# Patient Record
Sex: Male | Born: 1960 | Race: White | Hispanic: No | Marital: Single | Smoking: Never smoker
Health system: Southern US, Community
[De-identification: ages and names within clinical notes are randomized; demographics above are authoritative.]

## PROBLEM LIST (undated history)

## (undated) DIAGNOSIS — Z905 Acquired absence of kidney: Secondary | ICD-10-CM

## (undated) DIAGNOSIS — D759 Disease of blood and blood-forming organs, unspecified: Secondary | ICD-10-CM

## (undated) DIAGNOSIS — F1011 Alcohol abuse, in remission: Secondary | ICD-10-CM

## (undated) DIAGNOSIS — D696 Thrombocytopenia, unspecified: Principal | ICD-10-CM

## (undated) DIAGNOSIS — F1010 Alcohol abuse, uncomplicated: Secondary | ICD-10-CM

## (undated) DIAGNOSIS — I330 Acute and subacute infective endocarditis: Secondary | ICD-10-CM

## (undated) DIAGNOSIS — Z87442 Personal history of urinary calculi: Secondary | ICD-10-CM

## (undated) DIAGNOSIS — K219 Gastro-esophageal reflux disease without esophagitis: Secondary | ICD-10-CM

## (undated) DIAGNOSIS — I10 Essential (primary) hypertension: Secondary | ICD-10-CM

## (undated) HISTORY — DX: Thrombocytopenia, unspecified: D69.6

## (undated) HISTORY — PX: OTHER SURGICAL HISTORY: SHX169

---

## 2008-02-03 ENCOUNTER — Emergency Department (HOSPITAL_COMMUNITY): Admission: EM | Admit: 2008-02-03 | Discharge: 2008-02-03 | Payer: Self-pay | Admitting: Emergency Medicine

## 2008-02-27 ENCOUNTER — Emergency Department (HOSPITAL_COMMUNITY): Admission: EM | Admit: 2008-02-27 | Discharge: 2008-02-27 | Payer: Self-pay | Admitting: Emergency Medicine

## 2010-12-02 LAB — URINE MICROSCOPIC-ADD ON

## 2010-12-02 LAB — DIFFERENTIAL
Eosinophils Absolute: 0 10*3/uL (ref 0.0–0.7)
Eosinophils Relative: 0 % (ref 0–5)
Lymphocytes Relative: 20 % (ref 12–46)
Lymphs Abs: 1 10*3/uL (ref 0.7–4.0)
Monocytes Absolute: 0.8 10*3/uL (ref 0.1–1.0)

## 2010-12-02 LAB — COMPREHENSIVE METABOLIC PANEL
ALT: 136 U/L — ABNORMAL HIGH (ref 0–53)
AST: 134 U/L — ABNORMAL HIGH (ref 0–37)
Albumin: 4 g/dL (ref 3.5–5.2)
CO2: 26 mEq/L (ref 19–32)
Calcium: 9.1 mg/dL (ref 8.4–10.5)
Chloride: 97 mEq/L (ref 96–112)
GFR calc Af Amer: >60 mL/min (ref >60)
GFR calc non Af Amer: >60 mL/min (ref >60)
Sodium: 134 mEq/L — ABNORMAL LOW (ref 135–145)
Total Bilirubin: 1.2 mg/dL (ref 0.3–1.2)

## 2010-12-02 LAB — RAPID URINE DRUG SCREEN, HOSP PERFORMED
Amphetamines: NOT DETECTED
Barbiturates: NOT DETECTED
Opiates: NOT DETECTED

## 2010-12-02 LAB — URINALYSIS, ROUTINE W REFLEX MICROSCOPIC
Glucose, UA: >1000 mg/dL — AB
Ketones, ur: >80 mg/dL — AB
Leukocytes, UA: NEGATIVE
Nitrite: NEGATIVE
Nitrite: NEGATIVE
Specific Gravity, Urine: 1.011 (ref 1.005–1.030)
Specific Gravity, Urine: 1.027 (ref 1.005–1.030)
Urobilinogen, UA: 0.2 mg/dL (ref 0.0–1.0)
pH: 6 (ref 5.0–8.0)
pH: 7 (ref 5.0–8.0)

## 2010-12-02 LAB — CBC
Platelets: 263 10*3/uL (ref 150–400)
RBC: 4.75 MIL/uL (ref 4.22–5.81)
WBC: 5.3 10*3/uL (ref 4.0–10.5)

## 2010-12-02 LAB — POCT CARDIAC MARKERS: Troponin i, poc: <0.05 ng/mL (ref 0.00–0.09)

## 2016-02-29 ENCOUNTER — Emergency Department (HOSPITAL_COMMUNITY)
Admission: EM | Admit: 2016-02-29 | Discharge: 2016-02-29 | Disposition: A | Discharge status: Home / Self Care | Payer: Managed Care, Other (non HMO) | Attending: Emergency Medicine | Admitting: Emergency Medicine

## 2016-02-29 ENCOUNTER — Emergency Department (HOSPITAL_COMMUNITY): Payer: Managed Care, Other (non HMO)

## 2016-02-29 ENCOUNTER — Encounter (HOSPITAL_COMMUNITY): Payer: Self-pay | Admitting: Emergency Medicine

## 2016-02-29 DIAGNOSIS — R05 Cough: Secondary | ICD-10-CM | POA: Diagnosis present

## 2016-02-29 DIAGNOSIS — G9389 Other specified disorders of brain: Secondary | ICD-10-CM | POA: Diagnosis not present

## 2016-02-29 DIAGNOSIS — J0100 Acute maxillary sinusitis, unspecified: Secondary | ICD-10-CM | POA: Insufficient documentation

## 2016-02-29 LAB — CBC WITH DIFFERENTIAL/PLATELET
BASOS ABS: 0 10*3/uL (ref 0.0–0.1)
BASOS PCT: 1 %
EOS ABS: 0 10*3/uL (ref 0.0–0.7)
EOS PCT: 1 %
HEMATOCRIT: 34.1 % — AB (ref 39.0–52.0)
Hemoglobin: 11.1 g/dL — ABNORMAL LOW (ref 13.0–17.0)
Lymphocytes Relative: 30 %
Lymphs Abs: 1 10*3/uL (ref 0.7–4.0)
MCH: 28.5 pg (ref 26.0–34.0)
MCHC: 32.6 g/dL (ref 30.0–36.0)
MCV: 87.7 fL (ref 78.0–100.0)
MONO ABS: 0.7 10*3/uL (ref 0.1–1.0)
MONOS PCT: 21 %
NEUTROS ABS: 1.6 10*3/uL — AB (ref 1.7–7.7)
Neutrophils Relative %: 47 %
PLATELETS: 53 10*3/uL — AB (ref 150–400)
RBC: 3.89 MIL/uL — ABNORMAL LOW (ref 4.22–5.81)
RDW: 17 % — AB (ref 11.5–15.5)
WBC: 3.3 10*3/uL — ABNORMAL LOW (ref 4.0–10.5)

## 2016-02-29 LAB — INFLUENZA PANEL BY PCR (TYPE A & B)
INFLAPCR: NEGATIVE
INFLBPCR: NEGATIVE

## 2016-02-29 LAB — HEPATIC FUNCTION PANEL
ALBUMIN: 3 g/dL — AB (ref 3.5–5.0)
ALT: 75 U/L — ABNORMAL HIGH (ref 17–63)
AST: 118 U/L — ABNORMAL HIGH (ref 15–41)
Alkaline Phosphatase: 77 U/L (ref 38–126)
BILIRUBIN DIRECT: 0.4 mg/dL (ref 0.1–0.5)
BILIRUBIN TOTAL: 1.2 mg/dL (ref 0.3–1.2)
Indirect Bilirubin: 0.8 mg/dL (ref 0.3–0.9)
Total Protein: 7.1 g/dL (ref 6.5–8.1)

## 2016-02-29 LAB — BASIC METABOLIC PANEL
Anion gap: 6 (ref 5–15)
BUN: 8 mg/dL (ref 6–20)
CALCIUM: 8.2 mg/dL — AB (ref 8.9–10.3)
CO2: 26 mmol/L (ref 22–32)
CREATININE: 1.05 mg/dL (ref 0.61–1.24)
Chloride: 102 mmol/L (ref 101–111)
Glucose, Bld: 106 mg/dL — ABNORMAL HIGH (ref 65–99)
Potassium: 3.8 mmol/L (ref 3.5–5.1)
Sodium: 134 mmol/L — ABNORMAL LOW (ref 135–145)

## 2016-02-29 LAB — MAGNESIUM: MAGNESIUM: 1.7 mg/dL (ref 1.7–2.4)

## 2016-02-29 LAB — ETHANOL: Alcohol, Ethyl (B): <5 mg/dL (ref <5)

## 2016-02-29 MED ORDER — SODIUM CHLORIDE 0.9 % IV BOLUS (SEPSIS)
1000.0000 mL | Freq: Once | INTRAVENOUS | Status: AC
  Administered 2016-02-29: 1000 mL via INTRAVENOUS

## 2016-02-29 MED ORDER — HYDROXYZINE HCL 25 MG PO TABS: ORAL_TABLET | ORAL | 0 refills | Status: DC

## 2016-02-29 MED ORDER — AMOXICILLIN 500 MG PO CAPS: 500.0000 mg | ORAL_CAPSULE | Freq: Three times a day (TID) | ORAL | 0 refills | Status: DC

## 2016-02-29 NOTE — Discharge Instructions (Signed)
Follow-up with Dr. Galen ManilaPenland or your family doctor in 1-2 weeks to check on your sinusitis and your low platelets

## 2016-02-29 NOTE — ED Triage Notes (Signed)
Patient complains of body aches and fever at home. States muscle cramps in arms and legs Thursday night. States cramps came back last night. NAD in triage.

## 2016-02-29 NOTE — ED Provider Notes (Signed)
[facility] Provider Note   CSN: [id number] Arrival date & time: 02/29/16  1109     History   Chief Complaint Chief Complaint  Patient presents with  . Generalized Body Aches  . Fever    HPI Thomas [name] is a 56 y.o. male.  Patient states that he's had sinus congestion and cough for a week. He also states that he's been having some muscle contractions in his arms and his legs for couple days   The history is provided by the patient. No language interpreter was used.  Weakness  Primary symptoms include no focal weakness. This is a new problem. The current episode started more than 1 week ago. The problem has not changed since onset.There was no focality noted. There has been no fever. Pertinent negatives include no shortness of breath, no chest pain and no headaches. There were no medications administered prior to arrival. Associated medical issues do not include trauma.    History reviewed. No pertinent past medical history.  There are no active problems to display for this patient.   Past Surgical History:  Procedure Laterality Date  . renalectomy Left        Home Medications    Prior to Admission medications   Medication Sig Start Date End Date Taking? Authorizing Provider  Aspirin-Salicylamide-Caffeine (BC HEADACHE) 325-95-16 MG TABS Take 1 packet by mouth daily as needed (for pain).   Yes Historical Provider, MD  guaiFENesin (MUCINEX) 600 MG 12 hr tablet Take 600 mg by mouth 2 (two) times daily as needed for cough or to loosen phlegm.   Yes Historical Provider, MD  Pseudoeph-Doxylamine-DM-APAP (DAYQUIL/NYQUIL COLD/FLU RELIEF PO) Take 2 capsules by mouth daily as needed (for cold and cough).   Yes Historical Provider, MD  amoxicillin (AMOXIL) 500 MG capsule Take 1 capsule (500 mg total) by mouth 3 (three) times daily. 02/29/16   Bethann BerkshireJoseph Angellee Cohill, MD    Family History No family history on file.  Social History Social History  Substance Use Topics  .  Smoking status: Never Smoker  . Smokeless tobacco: Never Used  . Alcohol use Yes     Comment: occ     Allergies   Codeine   Review of Systems Review of Systems  Constitutional: Negative for appetite change and fatigue.  HENT: Positive for congestion. Negative for ear discharge and sinus pressure.   Eyes: Negative for discharge.  Respiratory: Negative for cough and shortness of breath.   Cardiovascular: Negative for chest pain.  Gastrointestinal: Negative for abdominal pain and diarrhea.  Genitourinary: Negative for frequency and hematuria.  Musculoskeletal: Negative for back pain.  Skin: Negative for rash.  Neurological: Positive for weakness. Negative for focal weakness, seizures and headaches.  Psychiatric/Behavioral: Negative for hallucinations.     Physical Exam Updated Vital Signs BP 141/74   Pulse 105   Temp 98.8 F (37.1 C) (Oral)   Resp 18   Ht 5\' 11"  (1.803 m)   Wt 180 lb (81.6 kg)   SpO2 96%   BMI 25.10 kg/m   Physical Exam  Constitutional: He is oriented to person, place, and time. He appears well-developed.  HENT:  Head: Normocephalic.  Eyes: Conjunctivae and EOM are normal. No scleral icterus.  Neck: Neck supple. No thyromegaly present.  Cardiovascular: Normal rate and regular rhythm.  Exam reveals no gallop and no friction rub.   No murmur heard. Pulmonary/Chest: No stridor. He has no wheezes. He has no rales. He exhibits no tenderness.  Abdominal: He exhibits no  distension. There is no tenderness. There is no rebound.  Musculoskeletal: Normal range of motion. He exhibits no edema.  Lymphadenopathy:    He has no cervical adenopathy.  Neurological: He is oriented to person, place, and time. He exhibits normal muscle tone. Coordination normal.  Skin: No rash noted. No erythema.  Psychiatric: He has a normal mood and affect. His behavior is normal.     ED Treatments / Results  Labs (all labs ordered are listed, but only abnormal results are  displayed) Labs Reviewed  CBC WITH DIFFERENTIAL/PLATELET - Abnormal; Notable for the following:       Result Value   WBC 3.3 (*)    RBC 3.89 (*)    Hemoglobin 11.1 (*)    HCT 34.1 (*)    RDW 17.0 (*)    Platelets 53 (*)    Neutro Abs 1.6 (*)    All other components within normal limits  BASIC METABOLIC PANEL - Abnormal; Notable for the following:    Sodium 134 (*)    Glucose, Bld 106 (*)    Calcium 8.2 (*)    All other components within normal limits  HEPATIC FUNCTION PANEL - Abnormal; Notable for the following:    Albumin 3.0 (*)    AST 118 (*)    ALT 75 (*)    All other components within normal limits  INFLUENZA PANEL BY PCR (TYPE A & B, H1N1)  MAGNESIUM  ETHANOL    EKG  EKG Interpretation None       Radiology Dg Chest 2 View  Result Date: 02/29/2016 CLINICAL DATA:  Weakness, body aches, fever EXAM: CHEST  2 VIEW COMPARISON:  None. FINDINGS: Heart and mediastinal contours are within normal limits. No focal opacities or effusions. No acute bony abnormality. IMPRESSION: No active cardiopulmonary disease. Electronically Signed   By: Charlett Nose M.D.   On: 02/29/2016 15:07   Ct Head Wo Contrast  Result Date: 02/29/2016 CLINICAL DATA:  56 y/o M; body aches and fever with muscle cramps in the arms and legs. EXAM: CT HEAD WITHOUT CONTRAST TECHNIQUE: Contiguous axial images were obtained from the base of the skull through the vertex without intravenous contrast. COMPARISON:  02/03/2008 CT head. FINDINGS: Brain: No evidence of acute infarction, hemorrhage, hydrocephalus, extra-axial collection or mass lesion/mass effect. Vascular: No hyperdense vessel or unexpected calcification. Skull: Normal. Negative for fracture or focal lesion. Sinuses/Orbits: Partial opacification of the ethmoid sinuses. Normal pneumatization of the visualized mastoid air cells. Orbits are unremarkable. Other: None. IMPRESSION: 1. No acute intracranial abnormality.  Unremarkable CT of the brain. 2. Paranasal  sinus disease with partial ethmoid opacification. Electronically Signed   By: Mitzi Hansen M.D.   On: 02/29/2016 15:25    Procedures Procedures (including critical care time)  Medications Ordered in ED Medications  sodium chloride 0.9 % bolus 1,000 mL (1,000 mLs Intravenous New Bag/Given 02/29/16 1449)     Initial Impression / Assessment and Plan / ED Course  I have reviewed the triage vital signs and the nursing notes.  Pertinent labs & imaging results that were available during my care of the patient were reviewed by me and considered in my medical decision making (see chart for details).  Clinical Course     Patient has sinusitis on CT. Along with thrombocytopenia. Patient will be put on amoxicillin and is referred to Dr. Galen Manila for the thrombocytopenia  Final Clinical Impressions(s) / ED Diagnoses   Final diagnoses:  Acute maxillary sinusitis, recurrence not specified  New Prescriptions New Prescriptions   AMOXICILLIN (AMOXIL) 500 MG CAPSULE    Take 1 capsule (500 mg total) by mouth 3 (three) times daily.     Bethann Berkshire, MD 02/29/16 Zollie Pee

## 2016-02-29 NOTE — ED Notes (Signed)
CRITICAL VALUE ALERT  Critical value received:  Platelets 53  Date of notification:  02/29/16  Time of notification:  1338  Critical value read back:Yes.    Nurse who received alert:  c Mardi Cannady rn  Responding MD:  Dr zammitt  Time MD responded:  1339

## 2016-04-03 ENCOUNTER — Ambulatory Visit (HOSPITAL_COMMUNITY): Payer: Managed Care, Other (non HMO) | Admitting: Oncology

## 2016-04-12 ENCOUNTER — Encounter (HOSPITAL_COMMUNITY): Payer: Self-pay | Admitting: Oncology

## 2016-04-12 ENCOUNTER — Encounter (HOSPITAL_COMMUNITY): Payer: Managed Care, Other (non HMO) | Attending: Oncology | Admitting: Oncology

## 2016-04-12 DIAGNOSIS — D696 Thrombocytopenia, unspecified: Secondary | ICD-10-CM

## 2016-04-12 HISTORY — DX: Thrombocytopenia, unspecified: D69.6

## 2016-04-12 NOTE — Assessment & Plan Note (Deleted)
One instance of thrombocytopenia on 02/29/2016 with pancytopenia (normocytic, normochromic with elevated RDW) in the setting of sinusitis on CT imaging.  Options:  1. Repeat CBC diff.  If normal, return in 6 months for follow-up and if all is well, discharge from clinic with the understanding that acute pancytopenia was reactive to acute infectious process.  If cytopenias persist, then patient will be recalled for additional lab work later this week. 2. Or, work-up for pancytopenia  Labs today: CBC diff, CMET, LDH, ESR, CRP, anemia panel, retic count, haptoglobin, EPO, SPEP + IFE, light chain assay, IgG, IgM, IgA, HIV 1/2 Ab+Ag, acute hepatitis panel, peripheral smear review, stool cards x 3.  This lab work-up will not only focus on thrombocytopenia, but will also take into account pancytopenia.  Pancytopenia could have been reactive to acute infection (sinusitis) in the ED.    [place] of abdomen to evaluate for splenomegaly and cirrhosis of liver/fatty infiltration of liver.  I have broached the subject of a bone marrow aspiration and biopsy if peripheral work-up is not revealing or is concerning for a primary bone marrow disorder.  Return in 2-3 weeks for follow-up.

## 2016-04-12 NOTE — Progress Notes (Signed)
NO SHOW

## 2016-05-09 ENCOUNTER — Ambulatory Visit (HOSPITAL_COMMUNITY): Payer: Managed Care, Other (non HMO) | Admitting: Oncology

## 2017-09-10 DIAGNOSIS — Z6827 Body mass index (BMI) 27.0-27.9, adult: Secondary | ICD-10-CM | POA: Diagnosis not present

## 2017-09-10 DIAGNOSIS — E663 Overweight: Secondary | ICD-10-CM | POA: Diagnosis not present

## 2017-09-10 DIAGNOSIS — Z23 Encounter for immunization: Secondary | ICD-10-CM | POA: Diagnosis not present

## 2017-09-10 DIAGNOSIS — K645 Perianal venous thrombosis: Secondary | ICD-10-CM | POA: Diagnosis not present

## 2017-09-10 DIAGNOSIS — Z1389 Encounter for screening for other disorder: Secondary | ICD-10-CM | POA: Diagnosis not present

## 2017-09-10 DIAGNOSIS — K648 Other hemorrhoids: Secondary | ICD-10-CM | POA: Diagnosis not present

## 2018-02-22 DIAGNOSIS — M25511 Pain in right shoulder: Secondary | ICD-10-CM | POA: Diagnosis not present

## 2018-03-01 DIAGNOSIS — M25511 Pain in right shoulder: Secondary | ICD-10-CM | POA: Diagnosis not present

## 2018-03-28 DIAGNOSIS — M25511 Pain in right shoulder: Secondary | ICD-10-CM | POA: Diagnosis not present

## 2019-01-11 ENCOUNTER — Other Ambulatory Visit: Payer: Self-pay

## 2019-01-11 ENCOUNTER — Emergency Department (HOSPITAL_COMMUNITY)
Admission: EM | Admit: 2019-01-11 | Discharge: 2019-01-11 | Disposition: A | Discharge status: Home / Self Care | Payer: Managed Care, Other (non HMO) | Attending: Emergency Medicine | Admitting: Emergency Medicine

## 2019-01-11 ENCOUNTER — Encounter (HOSPITAL_COMMUNITY): Payer: Self-pay | Admitting: Emergency Medicine

## 2019-01-11 DIAGNOSIS — R509 Fever, unspecified: Secondary | ICD-10-CM | POA: Insufficient documentation

## 2019-01-11 DIAGNOSIS — Z5321 Procedure and treatment not carried out due to patient leaving prior to being seen by health care provider: Secondary | ICD-10-CM | POA: Insufficient documentation

## 2019-01-11 NOTE — ED Triage Notes (Signed)
Fever as high as 101, body aches, cough and sneezing x 2 days.  Took 2 tylenols x 1hour ago. Has been around someone that is positive for Covid.

## 2019-01-17 ENCOUNTER — Other Ambulatory Visit: Payer: Self-pay

## 2019-01-17 DIAGNOSIS — Z20822 Contact with and (suspected) exposure to covid-19: Secondary | ICD-10-CM

## 2019-01-18 ENCOUNTER — Telehealth: Payer: Self-pay

## 2019-01-18 NOTE — Telephone Encounter (Signed)
Pt called for covid lab results-advised results are not back. MyChart enrollment sent to pt via email.

## 2019-01-19 LAB — NOVEL CORONAVIRUS, NAA: SARS-CoV-2, NAA: NOT DETECTED

## 2019-01-21 DIAGNOSIS — U071 COVID-19: Secondary | ICD-10-CM | POA: Diagnosis not present

## 2019-04-04 ENCOUNTER — Ambulatory Visit: Payer: BC Managed Care – PPO | Attending: Internal Medicine

## 2019-04-04 ENCOUNTER — Other Ambulatory Visit: Payer: Self-pay

## 2019-04-04 DIAGNOSIS — Z20822 Contact with and (suspected) exposure to covid-19: Secondary | ICD-10-CM | POA: Insufficient documentation

## 2019-04-06 LAB — NOVEL CORONAVIRUS, NAA: SARS-CoV-2, NAA: NOT DETECTED

## 2019-04-25 ENCOUNTER — Ambulatory Visit
Admission: EM | Admit: 2019-04-25 | Discharge: 2019-04-25 | Disposition: A | Discharge status: Home / Self Care | Payer: BC Managed Care – PPO | Attending: Emergency Medicine | Admitting: Emergency Medicine

## 2019-04-25 DIAGNOSIS — M5442 Lumbago with sciatica, left side: Secondary | ICD-10-CM

## 2019-04-25 DIAGNOSIS — R52 Pain, unspecified: Secondary | ICD-10-CM | POA: Diagnosis not present

## 2019-04-25 MED ORDER — METHYLPREDNISOLONE SODIUM SUCC 125 MG IJ SOLR
60.0000 mg | Freq: Once | INTRAMUSCULAR | Status: AC
  Administered 2019-04-25: 60 mg via INTRAMUSCULAR

## 2019-04-25 MED ORDER — CYCLOBENZAPRINE HCL 10 MG PO TABS: 10.0000 mg | ORAL_TABLET | Freq: Two times a day (BID) | ORAL | 0 refills | Status: DC | PRN

## 2019-04-25 MED ORDER — KETOROLAC TROMETHAMINE 60 MG/2ML IM SOLN
60.0000 mg | Freq: Once | INTRAMUSCULAR | Status: AC
  Administered 2019-04-25: 60 mg via INTRAMUSCULAR

## 2019-04-25 MED ORDER — IBUPROFEN 800 MG PO TABS: 800.0000 mg | ORAL_TABLET | Freq: Three times a day (TID) | ORAL | 0 refills | Status: DC

## 2019-04-25 MED ORDER — PREDNISONE 10 MG PO TABS: 20.0000 mg | ORAL_TABLET | Freq: Every day | ORAL | 0 refills | Status: DC

## 2019-04-25 NOTE — Discharge Instructions (Addendum)
Rest, ice and heat as needed Ensure adequate ROM as tolerated. Prescribed ibuprofen as needed for inflammation and pain relief Prescribed flexeril  for muscle spasm.  Do not drive or operate heavy machinery while taking this medication Return here or go to ER if you have any new or worsening symptoms such as numbness/tingling of the inner thighs, loss of bladder or bowel control, headache/blurry vision, nausea/vomiting, confusion/altered mental status, dizziness, weakness, passing out, imbalance, etc...   

## 2019-04-25 NOTE — ED Triage Notes (Signed)
Pt presents with left side buttock pain that radiates down left leg , pain rated 10/10 pt states that began hurting about 8 days ago

## 2019-04-25 NOTE — ED Provider Notes (Signed)
[facility]    CSN: [id number] Arrival date & time: 04/25/19  1357      History   Chief Complaint Chief Complaint  Patient presents with  . Back Pain    HPI Thomas [name] is a 59 y.o. male.   Who presented to the urgent care for complaint of left-sided buttocks pain for the past 8 days.  Pain is radiating to his left lower extremities.  Denies any precipitating event.  He localizes the pain to the left buttocks and left leg.  He describes the pain as constant and achy.  He has tried OTC medications without relief.  His symptoms are made worse with ROM.  He reports similar symptoms in the past and was diagnosed with back pain with sciatica nerve involvement.  Patient was treated with steroid and pain medication.      Back Pain   Past Medical History:  Diagnosis Date  . Thrombocytopenia ([place]) 04/12/2016    Patient Active Problem List   Diagnosis Date Noted  . Thrombocytopenia ([place]) 04/12/2016    Past Surgical History:  Procedure Laterality Date  . renalectomy Left        Home Medications    Prior to Admission medications   Medication Sig Start Date End Date Taking? Authorizing Provider  amoxicillin (AMOXIL) 500 MG capsule Take 1 capsule (500 mg total) by mouth 3 (three) times daily. 02/29/16   Milton Ferguson, MD  Aspirin-Salicylamide-Caffeine (BC HEADACHE) [phone] MG TABS Take 1 packet by mouth daily as needed (for pain).    [provider]  cyclobenzaprine (FLEXERIL) 10 MG tablet Take 1 tablet (10 mg total) by mouth 2 (two) times daily as needed for muscle spasms. 04/25/19   Zelia Yzaguirre, Darrelyn Hillock, FNP  guaiFENesin (MUCINEX) 600 MG 12 hr tablet Take 600 mg by mouth 2 (two) times daily as needed for cough or to loosen phlegm.    [provider]  hydrOXYzine (ATARAX/VISTARIL) 25 MG tablet Take one at bedtime as needed for sleep 02/29/16   Milton Ferguson, MD  ibuprofen (ADVIL) 800 MG tablet Take 1 tablet (800 mg total) by mouth 3 (three)  times daily. Take with food 04/25/19   Rashan Rounsaville, Darrelyn Hillock, FNP  predniSONE (DELTASONE) 10 MG tablet Take 2 tablets (20 mg total) by mouth daily. 04/25/19   Bryan Goin, Darrelyn Hillock, FNP  Pseudoeph-Doxylamine-DM-APAP (DAYQUIL/NYQUIL COLD/FLU RELIEF PO) Take 2 capsules by mouth daily as needed (for cold and cough).    [provider]    Family History Family History  Problem Relation Age of Onset  . Healthy Mother   . Cancer Father     Social History Social History   Tobacco Use  . Smoking status: Never Smoker  . Smokeless tobacco: Never Used  Substance Use Topics  . Alcohol use: Yes    Comment: occ  . Drug use: No     Allergies   Patient has no known allergies.   Review of Systems Review of Systems  Constitutional: Negative.   Respiratory: Negative.   Cardiovascular: Negative.   Musculoskeletal: Positive for back pain.  Neurological: Negative.   All other systems reviewed and are negative.    Physical Exam Triage Vital Signs ED Triage Vitals [04/25/19 1403]  Enc Vitals Group     BP      Pulse      Resp      Temp      Temp src      SpO2      Weight  Height      Head Circumference      Peak Flow      Pain Score 10     Pain Loc      Pain Edu?      Excl. in GC?    No data found.  Updated Vital Signs BP (!) 183/90   Pulse 83   Temp 98.4 F (36.9 C)   Resp 20   SpO2 96%   Visual Acuity Right Eye Distance:   Left Eye Distance:   Bilateral Distance:    Right Eye Near:   Left Eye Near:    Bilateral Near:     Physical Exam Vitals and nursing note reviewed.  Constitutional:      General: He is not in acute distress.    Appearance: Normal appearance. He is normal weight. He is not ill-appearing, toxic-appearing or diaphoretic.  Cardiovascular:     Rate and Rhythm: Normal rate and regular rhythm.     Pulses: Normal pulses.     Heart sounds: Normal heart sounds. No murmur. No friction rub. No gallop.   Pulmonary:     Effort: Pulmonary  effort is normal. No respiratory distress.     Breath sounds: Normal breath sounds. No stridor. No wheezing, rhonchi or rales.  Chest:     Chest wall: No tenderness.  Musculoskeletal:        General: Tenderness present. No swelling or signs of injury. Normal range of motion.     Cervical back: Normal.     Thoracic back: Normal.     Lumbar back: Spasms and tenderness present.  Neurological:     General: No focal deficit present.     Mental Status: He is alert.     Cranial Nerves: Cranial nerves are intact.     Sensory: Sensation is intact.     Motor: Motor function is intact.     Coordination: Coordination is intact.     Gait: Gait is intact.      UC Treatments / Results  Labs (all labs ordered are listed, but only abnormal results are displayed) Labs Reviewed - No data to display  EKG   Radiology No results found.  Procedures Procedures (including critical care time)  Medications Ordered in UC Medications  ketorolac (TORADOL) injection 60 mg (60 mg Intramuscular Given 04/25/19 1420)  methylPREDNISolone sodium succinate (SOLU-MEDROL) 125 mg/2 mL injection 60 mg (60 mg Intramuscular Given 04/25/19 1420)    Initial Impression / Assessment and Plan / UC Course  I have reviewed the triage vital signs and the nursing notes.  Pertinent labs & imaging results that were available during my care of the patient were reviewed by me and considered in my medical decision making (see chart for details).   Patient is stable for discharge. Ibuprofen 800 was prescribed for pain management Prednisone was prescribed for inflammation Flexeril for muscle spasm Solu-Medrol and Toradol shot was given in office Advised patient to follow-up with primary care To return for worsening of symptoms   Final Clinical Impressions(s) / UC Diagnoses   Final diagnoses:  Acute left-sided low back pain with left-sided sciatica     Discharge Instructions     Rest, ice and heat as needed Ensure  adequate ROM as tolerated. Prescribed ibuprofen as needed for inflammation and pain relief Prescribed flexeril  for muscle spasm.  Do not drive or operate heavy machinery while taking this medication Return here or go to ER if you have any new or worsening symptoms such  as numbness/tingling of the inner thighs, loss of bladder or bowel control, headache/blurry vision, nausea/vomiting, confusion/altered mental status, dizziness, weakness, passing out, imbalance, etc...      ED Prescriptions    Medication Sig Dispense Auth. Provider   predniSONE (DELTASONE) 10 MG tablet Take 2 tablets (20 mg total) by mouth daily. 15 tablet Nakeshia Waldeck S, FNP   ibuprofen (ADVIL) 800 MG tablet Take 1 tablet (800 mg total) by mouth 3 (three) times daily. Take with food 42 tablet Jenissa Tyrell, Zachery Dakins, FNP   cyclobenzaprine (FLEXERIL) 10 MG tablet Take 1 tablet (10 mg total) by mouth 2 (two) times daily as needed for muscle spasms. 30 tablet Melayah Skorupski, Zachery Dakins, FNP     PDMP not reviewed this encounter.   Durward Parcel, FNP 04/25/19 1424

## 2019-05-09 ENCOUNTER — Ambulatory Visit (INDEPENDENT_AMBULATORY_CARE_PROVIDER_SITE_OTHER): Payer: BC Managed Care – PPO

## 2019-05-09 ENCOUNTER — Ambulatory Visit
Admission: EM | Admit: 2019-05-09 | Discharge: 2019-05-09 | Disposition: A | Discharge status: Home / Self Care | Payer: BC Managed Care – PPO | Attending: Emergency Medicine | Admitting: Emergency Medicine

## 2019-05-09 ENCOUNTER — Ambulatory Visit (HOSPITAL_COMMUNITY): Payer: BC Managed Care – PPO

## 2019-05-09 ENCOUNTER — Other Ambulatory Visit: Payer: Self-pay

## 2019-05-09 DIAGNOSIS — S3992XA Unspecified injury of lower back, initial encounter: Secondary | ICD-10-CM | POA: Diagnosis not present

## 2019-05-09 DIAGNOSIS — S62316P Displaced fracture of base of fifth metacarpal bone, right hand, subsequent encounter for fracture with malunion: Secondary | ICD-10-CM | POA: Diagnosis not present

## 2019-05-09 DIAGNOSIS — M5416 Radiculopathy, lumbar region: Secondary | ICD-10-CM | POA: Diagnosis not present

## 2019-05-09 DIAGNOSIS — S52612A Displaced fracture of left ulna styloid process, initial encounter for closed fracture: Secondary | ICD-10-CM | POA: Diagnosis not present

## 2019-05-09 DIAGNOSIS — R200 Anesthesia of skin: Secondary | ICD-10-CM

## 2019-05-09 DIAGNOSIS — M545 Low back pain: Secondary | ICD-10-CM | POA: Diagnosis not present

## 2019-05-09 DIAGNOSIS — M25531 Pain in right wrist: Secondary | ICD-10-CM | POA: Diagnosis not present

## 2019-05-09 DIAGNOSIS — M5410 Radiculopathy, site unspecified: Secondary | ICD-10-CM | POA: Diagnosis not present

## 2019-05-09 MED ORDER — PREDNISONE 10 MG (21) PO TBPK: ORAL_TABLET | ORAL | 0 refills | Status: DC

## 2019-05-09 MED ORDER — CYCLOBENZAPRINE HCL 10 MG PO TABS: 10.0000 mg | ORAL_TABLET | Freq: Two times a day (BID) | ORAL | 0 refills | Status: DC | PRN

## 2019-05-09 MED ORDER — IBUPROFEN 800 MG PO TABS: 800.0000 mg | ORAL_TABLET | Freq: Three times a day (TID) | ORAL | 0 refills | Status: DC

## 2019-05-09 MED ORDER — KETOROLAC TROMETHAMINE 60 MG/2ML IM SOLN
60.0000 mg | Freq: Once | INTRAMUSCULAR | Status: AC
  Administered 2019-05-09: 60 mg via INTRAMUSCULAR

## 2019-05-09 MED ORDER — DEXAMETHASONE SODIUM PHOSPHATE 10 MG/ML IJ SOLN
10.0000 mg | Freq: Once | INTRAMUSCULAR | Status: AC
  Administered 2019-05-09: 10 mg via INTRAMUSCULAR

## 2019-05-09 MED ORDER — TRAMADOL HCL 50 MG PO TABS: 50.0000 mg | ORAL_TABLET | Freq: Two times a day (BID) | ORAL | 0 refills | Status: AC | PRN

## 2019-05-09 NOTE — ED Provider Notes (Signed)
[facility]    CSN: [id number] Arrival date & time: 05/09/19  1093      History   Chief Complaint Chief Complaint  Patient presents with  . Wrist Injury  . Back Pain    HPI Thomas [name] is a 59 y.o. male.   Who presented to the urgent care with a complaint of low back pain for the past 20 days.  Denies precipitating event.  He localizes the pain to the left lower back that radiates to the left leg.  Reported new symptoms of left leg numbness.  He describes the pain as constant and achy. Patient was seen on 04/25/2019 for left low back pain with left sciatica nerve involvement and was prescribed prednisone, Flexeril and ibuprofen with relief.  Reported when medication ran out symptom return.  His symptoms are made worse with ROM.     He also presented to the urgent care with a complaint of right wrist pain for the past 2 weeks.  It developed after having a fall.  He localizes the pain to the right wrist.  He describes the pain as constant and achy and rated at 10 on a scale of 1-10.  Pain is worse on range of motion. He has tried OTC tylenol medications without relief.  His symptoms are made worse with movement .  He denies similar symptoms in the past.     The history is provided by the patient. No language interpreter was used.  Wrist Injury Associated symptoms: back pain   Back Pain   Past Medical History:  Diagnosis Date  . Thrombocytopenia (HCC) 04/12/2016    Patient Active Problem List   Diagnosis Date Noted  . Thrombocytopenia (HCC) 04/12/2016    Past Surgical History:  Procedure Laterality Date  . renalectomy Left        Home Medications    Prior to Admission medications   Medication Sig Start Date End Date Taking? Authorizing Provider  amoxicillin (AMOXIL) 500 MG capsule Take 1 capsule (500 mg total) by mouth 3 (three) times daily. 02/29/16   Bethann Berkshire, MD  Aspirin-Salicylamide-Caffeine (BC HEADACHE) [phone] MG TABS Take 1 packet by  mouth daily as needed (for pain).    [provider]  cyclobenzaprine (FLEXERIL) 10 MG tablet Take 1 tablet (10 mg total) by mouth 2 (two) times daily as needed for muscle spasms. 05/09/19   Minami Arriaga, Zachery Dakins, FNP  guaiFENesin (MUCINEX) 600 MG 12 hr tablet Take 600 mg by mouth 2 (two) times daily as needed for cough or to loosen phlegm.    [provider]  hydrOXYzine (ATARAX/VISTARIL) 25 MG tablet Take one at bedtime as needed for sleep 02/29/16   Bethann Berkshire, MD  ibuprofen (ADVIL) 800 MG tablet Take 1 tablet (800 mg total) by mouth 3 (three) times daily. Take with food 05/09/19   Kathlyn Leachman, Zachery Dakins, FNP  predniSONE (STERAPRED UNI-PAK 21 TAB) 10 MG (21) TBPK tablet Take 6 tabs by mouth daily  for 2 days, then 5 tabs for 2 days, then 4 tabs for 2 days, then 3 tabs for 2 days, 2 tabs for 2 days, then 1 tab by mouth daily for 2 days 05/09/19   Durward Parcel, FNP  Pseudoeph-Doxylamine-DM-APAP (DAYQUIL/NYQUIL COLD/FLU RELIEF PO) Take 2 capsules by mouth daily as needed (for cold and cough).    [provider]  traMADol (ULTRAM) 50 MG tablet Take 1 tablet (50 mg total) by mouth every 12 (twelve) hours as needed for up to  5 days for severe pain (take it only for sever pain). 05/09/19 05/14/19  Durward Parcel, FNP    Family History Family History  Problem Relation Age of Onset  . Healthy Mother   . Cancer Father     Social History Social History   Tobacco Use  . Smoking status: Never Smoker  . Smokeless tobacco: Never Used  Substance Use Topics  . Alcohol use: Yes    Comment: occ  . Drug use: No     Allergies   Patient has no known allergies.   Review of Systems Review of Systems  Constitutional: Negative.   Respiratory: Negative.   Cardiovascular: Negative.   Musculoskeletal: Positive for back pain and joint swelling.     Physical Exam Triage Vital Signs ED Triage Vitals  Enc Vitals Group     BP 05/09/19 0839 (!) 172/89     Pulse Rate  05/09/19 0839 79     Resp 05/09/19 0839 20     Temp 05/09/19 0839 98.6 F (37 C)     Temp src --      SpO2 05/09/19 0839 95 %     Weight --      Height --      Head Circumference --      Peak Flow --      Pain Score 05/09/19 0838 10     Pain Loc --      Pain Edu? --      Excl. in GC? --    No data found.  Updated Vital Signs BP (!) 172/89   Pulse 79   Temp 98.6 F (37 C)   Resp 20   SpO2 95%   Visual Acuity Right Eye Distance:   Left Eye Distance:   Bilateral Distance:    Right Eye Near:   Left Eye Near:    Bilateral Near:     Physical Exam Vitals and nursing note reviewed.  Constitutional:      General: He is not in acute distress.    Appearance: Normal appearance. He is normal weight. He is not ill-appearing or toxic-appearing.  Cardiovascular:     Rate and Rhythm: Normal rate and regular rhythm.     Pulses: Normal pulses.     Heart sounds: Normal heart sounds. No murmur. No friction rub. No gallop.   Pulmonary:     Effort: Pulmonary effort is normal. No respiratory distress.     Breath sounds: Normal breath sounds. No stridor. No wheezing, rhonchi or rales.  Chest:     Chest wall: No tenderness.  Musculoskeletal:        General: Swelling and tenderness present. No signs of injury.     Right wrist: Swelling present.     Left wrist: Normal.     Lumbar back: Spasms and tenderness present.     Comments: Back: Patient is able to ambulate to treatment area without assistance.  Patient seated on the stretcher in moderate distress.  There is no surface trauma, abrasion, scar, ecchymosis, laceration.  No CVA tenderness to percussion. ROM: Unable to complete range of motion due to pain.  Straight leg raise is positive for radiculopathy.  Right Wrist: The right wrist is without obvious asymmetry when compared to the left wrist.  No surface trauma, open wound,  or obvious deformity.  No overlying erythema or warmth.  Swelling present.  Limited range of motion with pain.   Sensory function of ulnar, radial median nerve intact.  Unable to complete Phalen  Test   Neurological:     Mental Status: He is alert and oriented to person, place, and time.     Cranial Nerves: No cranial nerve deficit.     Sensory: Sensory deficit present.     Motor: Weakness present.     Coordination: Coordination normal.     Gait: Gait normal.      UC Treatments / Results  Labs (all labs ordered are listed, but only abnormal results are displayed) Labs Reviewed - No data to display  EKG   Radiology DG Lumbar Spine Complete  Result Date: 05/09/2019 CLINICAL DATA:  Pain following fall.  Left-sided radicular symptoms EXAM: LUMBAR SPINE - COMPLETE 4+ VIEW COMPARISON:  None. FINDINGS: Frontal, lateral, spot lumbosacral lateral, and bilateral oblique views were obtained. There are 5 non-rib-bearing lumbar type vertebral bodies. There is no fracture or spondylolisthesis. There is mild disc space narrowing at L4-5 and L5-S1. There is facet osteoarthritic change at L4-5 and L5-S1 bilaterally. IMPRESSION: Relatively mild osteoarthritic change at L4-5 and L5-S1 bilaterally. No fracture or spondylolisthesis. Electronically Signed   By: Lowella Grip III M.D.   On: 05/09/2019 09:31   DG Wrist Complete Right  Result Date: 05/09/2019 CLINICAL DATA:  Pain following fall EXAM: RIGHT WRIST - COMPLETE 3+ VIEW COMPARISON:  None. FINDINGS: Frontal, oblique, and lateral views were obtained. There is a transversely oriented comminuted fracture of the distal radial metaphysis with mild impaction at the fracture site. There is dorsal angulation distally as well. There is avulsion of the ulnar styloid. No other acute fractures are evident. No dislocation. There is evidence of an old healed fracture of the fifth metacarpal with remodeling. There is no appreciable joint space narrowing or erosion. IMPRESSION: 1. Comminuted fracture distal radial metaphysis with impaction at the fracture site and volar  angulation distally. 2.  There is avulsion of the ulnar styloid. 3. Old healed fracture of the distal fifth metacarpal with remodeling. These results will be called to the ordering clinician or representative by the Radiologist Assistant, and communication documented in the PACS or [employer]. Electronically Signed   By: Lowella Grip III M.D.   On: 05/09/2019 09:30    Procedures Procedures (including critical care time)  Medications Ordered in UC Medications  ketorolac (TORADOL) injection 60 mg (60 mg Intramuscular Given 05/09/19 1001)  dexamethasone (DECADRON) injection 10 mg (10 mg Intramuscular Given 05/09/19 1001)    Initial Impression / Assessment and Plan / UC Course  I have reviewed the triage vital signs and the nursing notes.  Pertinent labs & imaging results that were available during my care of the patient were reviewed by me and considered in my medical decision making (see chart for details).     Patient is stable at discharge.  X-ray is positive for bony abnormality including fracture on the right wrist and osteoarthritic changes at L5 and S1.  I have reviewed the x-ray myself and the radiologist interpretation.  I am in agreement with the radiologist interpretation.    Decadron IM and Toradol IM were given in office Ibuprofen 800 was prescribed for mild pain moderate pain Tramadol was prescribed for severe pain Prednisone was prescribed for inflammation Advised patient to follow-up with primary care for referral to neurology and orthopedic. Return for worsening symptoms   Final Clinical Impressions(s) / UC Diagnoses   Final diagnoses:  Back pain with left-sided radiculopathy  Closed displaced fracture of base of fifth metacarpal bone of right hand with malunion, subsequent encounter  Left leg  numbness     Discharge Instructions     Take medication as prescribed Follow-up with your primary care for referral to orthopedic for wrist fracture and  neurology for back pain Return for worsening of symptoms    ED Prescriptions    Medication Sig Dispense Auth. Provider   ibuprofen (ADVIL) 800 MG tablet Take 1 tablet (800 mg total) by mouth 3 (three) times daily. Take with food 60 tablet Yesmin Mutch S, FNP   predniSONE (STERAPRED UNI-PAK 21 TAB) 10 MG (21) TBPK tablet Take 6 tabs by mouth daily  for 2 days, then 5 tabs for 2 days, then 4 tabs for 2 days, then 3 tabs for 2 days, 2 tabs for 2 days, then 1 tab by mouth daily for 2 days 42 tablet Kimyatta Lecy, Zachery Dakins, FNP   cyclobenzaprine (FLEXERIL) 10 MG tablet Take 1 tablet (10 mg total) by mouth 2 (two) times daily as needed for muscle spasms. 40 tablet Jodeci Roarty, Zachery Dakins, FNP   traMADol (ULTRAM) 50 MG tablet Take 1 tablet (50 mg total) by mouth every 12 (twelve) hours as needed for up to 5 days for severe pain (take it only for sever pain). 10 tablet Jeremy Ditullio, Zachery Dakins, FNP     I have reviewed the PDMP during this encounter.   Durward Parcel, FNP 05/09/19 1018

## 2019-05-09 NOTE — ED Triage Notes (Signed)
Pt presents with c/o ongoing siatic pain and is experiencing numbness in left foot . Pt also had fall and fell on right wrist 2 weeks ago, pt has swelling and limited range of motion

## 2019-05-09 NOTE — Discharge Instructions (Signed)
Take medication as prescribed Follow-up with your primary care for referral to orthopedic for wrist fracture and neurology for back pain Return for worsening of symptoms

## 2020-01-08 ENCOUNTER — Encounter: Payer: Self-pay | Admitting: Internal Medicine

## 2020-02-02 ENCOUNTER — Ambulatory Visit: Payer: 59

## 2020-03-18 ENCOUNTER — Telehealth: Payer: Self-pay

## 2020-03-18 NOTE — Telephone Encounter (Signed)
Lmom for pt to call back. 

## 2020-03-18 NOTE — Telephone Encounter (Signed)
Cancel pts nurse visit for Monday 03/22/2020. Pt would like to r/s anytime after May 03, 2020 due to his work schedule.  Pt states you don't have to call him back with the new appointment. It can be mailed to him if liked.

## 2020-03-22 ENCOUNTER — Other Ambulatory Visit: Payer: Self-pay

## 2020-03-22 ENCOUNTER — Ambulatory Visit: Payer: Self-pay

## 2020-03-22 NOTE — Telephone Encounter (Signed)
Called pt and rescheduled his appointment to 05/04/20 at 3:00.

## 2020-05-04 ENCOUNTER — Ambulatory Visit: Payer: Self-pay

## 2020-05-04 ENCOUNTER — Encounter: Payer: Self-pay | Admitting: Internal Medicine

## 2020-05-04 ENCOUNTER — Other Ambulatory Visit: Payer: Self-pay

## 2020-12-08 ENCOUNTER — Other Ambulatory Visit: Payer: Self-pay | Admitting: Family Medicine

## 2020-12-08 ENCOUNTER — Other Ambulatory Visit (HOSPITAL_COMMUNITY): Payer: Self-pay | Admitting: Family Medicine

## 2020-12-08 DIAGNOSIS — R7989 Other specified abnormal findings of blood chemistry: Secondary | ICD-10-CM

## 2020-12-10 ENCOUNTER — Ambulatory Visit (HOSPITAL_COMMUNITY)
Admission: RE | Admit: 2020-12-10 | Discharge: 2020-12-10 | Disposition: A | Discharge status: Home / Self Care | Payer: 59 | Source: Ambulatory Visit | Attending: Family Medicine | Admitting: Family Medicine

## 2020-12-10 ENCOUNTER — Other Ambulatory Visit: Payer: Self-pay

## 2020-12-10 DIAGNOSIS — R7989 Other specified abnormal findings of blood chemistry: Secondary | ICD-10-CM | POA: Insufficient documentation

## 2021-01-04 ENCOUNTER — Encounter (INDEPENDENT_AMBULATORY_CARE_PROVIDER_SITE_OTHER): Payer: Self-pay | Admitting: *Deleted

## 2021-01-13 ENCOUNTER — Encounter (HOSPITAL_COMMUNITY): Payer: Self-pay | Admitting: Radiology

## 2021-02-07 ENCOUNTER — Encounter (INDEPENDENT_AMBULATORY_CARE_PROVIDER_SITE_OTHER): Payer: Self-pay | Admitting: Gastroenterology

## 2021-02-07 ENCOUNTER — Encounter (INDEPENDENT_AMBULATORY_CARE_PROVIDER_SITE_OTHER): Payer: Self-pay | Admitting: *Deleted

## 2021-02-07 ENCOUNTER — Ambulatory Visit (INDEPENDENT_AMBULATORY_CARE_PROVIDER_SITE_OTHER): Payer: 59 | Admitting: Gastroenterology

## 2021-12-20 ENCOUNTER — Other Ambulatory Visit: Payer: Self-pay

## 2021-12-20 ENCOUNTER — Emergency Department (HOSPITAL_COMMUNITY): Payer: 59

## 2021-12-20 ENCOUNTER — Encounter (HOSPITAL_COMMUNITY): Payer: Self-pay | Admitting: Emergency Medicine

## 2021-12-20 ENCOUNTER — Emergency Department (HOSPITAL_COMMUNITY)
Admission: EM | Admit: 2021-12-20 | Discharge: 2021-12-20 | Disposition: A | Discharge status: Home / Self Care | Payer: 59 | Attending: Emergency Medicine | Admitting: Emergency Medicine

## 2021-12-20 DIAGNOSIS — R7401 Elevation of levels of liver transaminase levels: Secondary | ICD-10-CM | POA: Diagnosis not present

## 2021-12-20 DIAGNOSIS — R1011 Right upper quadrant pain: Secondary | ICD-10-CM | POA: Diagnosis not present

## 2021-12-20 DIAGNOSIS — Z20822 Contact with and (suspected) exposure to covid-19: Secondary | ICD-10-CM | POA: Insufficient documentation

## 2021-12-20 DIAGNOSIS — R059 Cough, unspecified: Secondary | ICD-10-CM | POA: Diagnosis not present

## 2021-12-20 DIAGNOSIS — R000 Tachycardia, unspecified: Secondary | ICD-10-CM | POA: Diagnosis not present

## 2021-12-20 DIAGNOSIS — K7689 Other specified diseases of liver: Secondary | ICD-10-CM | POA: Diagnosis not present

## 2021-12-20 DIAGNOSIS — J101 Influenza due to other identified influenza virus with other respiratory manifestations: Secondary | ICD-10-CM | POA: Diagnosis not present

## 2021-12-20 DIAGNOSIS — R509 Fever, unspecified: Secondary | ICD-10-CM | POA: Diagnosis not present

## 2021-12-20 DIAGNOSIS — D72819 Decreased white blood cell count, unspecified: Secondary | ICD-10-CM | POA: Diagnosis not present

## 2021-12-20 DIAGNOSIS — R319 Hematuria, unspecified: Secondary | ICD-10-CM | POA: Diagnosis not present

## 2021-12-20 LAB — LACTIC ACID, PLASMA
Lactic Acid, Venous: 1 mmol/L (ref 0.5–1.9)
Lactic Acid, Venous: 1.2 mmol/L (ref 0.5–1.9)

## 2021-12-20 LAB — COMPREHENSIVE METABOLIC PANEL
ALT: 71 U/L — ABNORMAL HIGH (ref 0–44)
AST: 113 U/L — ABNORMAL HIGH (ref 15–41)
Albumin: 3.4 g/dL — ABNORMAL LOW (ref 3.5–5.0)
Alkaline Phosphatase: 74 U/L (ref 38–126)
Anion gap: 8 (ref 5–15)
BUN: 10 mg/dL (ref 8–23)
CO2: 22 mmol/L (ref 22–32)
Calcium: 8.5 mg/dL — ABNORMAL LOW (ref 8.9–10.3)
Chloride: 105 mmol/L (ref 98–111)
Creatinine, Ser: 0.9 mg/dL (ref 0.61–1.24)
GFR, Estimated: >60 mL/min (ref >60)
Glucose, Bld: 115 mg/dL — ABNORMAL HIGH (ref 70–99)
Potassium: 3.4 mmol/L — ABNORMAL LOW (ref 3.5–5.1)
Sodium: 135 mmol/L (ref 135–145)
Total Bilirubin: 1 mg/dL (ref 0.3–1.2)
Total Protein: 8.2 g/dL — ABNORMAL HIGH (ref 6.5–8.1)

## 2021-12-20 LAB — CBC WITH DIFFERENTIAL/PLATELET
Abs Immature Granulocytes: 0.01 10*3/uL (ref 0.00–0.07)
Basophils Absolute: 0 10*3/uL (ref 0.0–0.1)
Basophils Relative: 1 %
Eosinophils Absolute: 0 10*3/uL (ref 0.0–0.5)
Eosinophils Relative: 0 %
HCT: 38.4 % — ABNORMAL LOW (ref 39.0–52.0)
Hemoglobin: 13.2 g/dL (ref 13.0–17.0)
Immature Granulocytes: 0 %
Lymphocytes Relative: 13 %
Lymphs Abs: 0.3 10*3/uL — ABNORMAL LOW (ref 0.7–4.0)
MCH: 29 pg (ref 26.0–34.0)
MCHC: 34.4 g/dL (ref 30.0–36.0)
MCV: 84.4 fL (ref 80.0–100.0)
Monocytes Absolute: 1.1 10*3/uL — ABNORMAL HIGH (ref 0.1–1.0)
Monocytes Relative: 44 %
Neutro Abs: 1.1 10*3/uL — ABNORMAL LOW (ref 1.7–7.7)
Neutrophils Relative %: 42 %
Platelets: 128 10*3/uL — ABNORMAL LOW (ref 150–400)
RBC: 4.55 MIL/uL (ref 4.22–5.81)
RDW: 16.7 % — ABNORMAL HIGH (ref 11.5–15.5)
WBC: 2.5 10*3/uL — ABNORMAL LOW (ref 4.0–10.5)
nRBC: 0 % (ref 0.0–0.2)

## 2021-12-20 LAB — URINALYSIS, ROUTINE W REFLEX MICROSCOPIC
Bacteria, UA: NONE SEEN
Bilirubin Urine: NEGATIVE
Glucose, UA: NEGATIVE mg/dL
Hgb urine dipstick: NEGATIVE
Ketones, ur: 5 mg/dL — AB
Leukocytes,Ua: NEGATIVE
Nitrite: NEGATIVE
Protein, ur: 30 mg/dL — AB
Specific Gravity, Urine: 1.026 (ref 1.005–1.030)
pH: 5 (ref 5.0–8.0)

## 2021-12-20 LAB — SARS CORONAVIRUS 2 BY RT PCR: SARS Coronavirus 2 by RT PCR: NEGATIVE

## 2021-12-20 LAB — RESP PANEL BY RT-PCR (FLU A&B, COVID) ARPGX2
Influenza A by PCR: POSITIVE — AB
Influenza B by PCR: NEGATIVE
SARS Coronavirus 2 by RT PCR: NEGATIVE

## 2021-12-20 LAB — PROTIME-INR
INR: 1.2 (ref 0.8–1.2)
Prothrombin Time: 14.8 seconds (ref 11.4–15.2)

## 2021-12-20 LAB — ACETAMINOPHEN LEVEL: Acetaminophen (Tylenol), Serum: 17 ug/mL (ref 10–30)

## 2021-12-20 MED ORDER — KETOROLAC TROMETHAMINE 30 MG/ML IJ SOLN
15.0000 mg | Freq: Once | INTRAMUSCULAR | Status: AC
  Administered 2021-12-20: 15 mg via INTRAVENOUS
  Filled 2021-12-20: qty 1

## 2021-12-20 MED ORDER — ONDANSETRON 4 MG PO TBDP
4.0000 mg | ORAL_TABLET | Freq: Once | ORAL | Status: AC
  Administered 2021-12-20: 4 mg via ORAL
  Filled 2021-12-20: qty 1

## 2021-12-20 MED ORDER — BENZONATATE 100 MG PO CAPS: 200.0000 mg | ORAL_CAPSULE | Freq: Three times a day (TID) | ORAL | 0 refills | Status: DC | PRN

## 2021-12-20 MED ORDER — LACTATED RINGERS IV BOLUS
3000.0000 mL | Freq: Once | INTRAVENOUS | Status: AC
  Administered 2021-12-20: 3000 mL via INTRAVENOUS

## 2021-12-20 MED ORDER — LACTATED RINGERS IV BOLUS: 1000.0000 mL | Freq: Once | INTRAVENOUS | Status: DC

## 2021-12-20 MED ORDER — OSELTAMIVIR PHOSPHATE 75 MG PO CAPS: 75.0000 mg | ORAL_CAPSULE | Freq: Two times a day (BID) | ORAL | 0 refills | Status: DC

## 2021-12-20 MED ORDER — ACETAMINOPHEN 325 MG PO TABS
650.0000 mg | ORAL_TABLET | Freq: Once | ORAL | Status: AC
  Administered 2021-12-20: 650 mg via ORAL
  Filled 2021-12-20: qty 2

## 2021-12-20 MED ORDER — SODIUM CHLORIDE 0.9 % IV SOLN
1.0000 g | Freq: Once | INTRAVENOUS | Status: AC
  Administered 2021-12-20: 1 g via INTRAVENOUS
  Filled 2021-12-20: qty 10

## 2021-12-20 MED ORDER — ONDANSETRON HCL 4 MG PO TABS: 4.0000 mg | ORAL_TABLET | Freq: Three times a day (TID) | ORAL | 0 refills | Status: DC | PRN

## 2021-12-20 NOTE — ED Triage Notes (Addendum)
Cough since yesterday, fevers and vomiting all night. Left lower back pain, urinating orange in color.

## 2021-12-20 NOTE — ED Provider Notes (Signed)
[facility] EMERGENCY DEPARTMENT Provider Note   CSN: [id number] Arrival date & time: 12/20/21  1328     History  Chief Complaint  Patient presents with   Back Pain   Fever    Thomas [name] is a 61 y.o. 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  He denies dysuria or increased urinary frequency although states he always urinates often as he has a good habit of drinking lots of water throughout the day.  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  He contacted his PCPs prior to presenting here and at his PCPs recommendation he took a home COVID test prior to arrival and this was negative.  The history is provided by the patient.       Home Medications Prior to Admission medications   Medication Sig Start Date End Date Taking? Authorizing Provider  benzonatate (TESSALON) 100 MG capsule Take 2 capsules (200 mg total) by mouth 3 (three) times daily as needed. 12/20/21  Yes Yaniris Braddock, Raynelle Fanning, PA-C  ondansetron (ZOFRAN) 4 MG tablet Take 1 tablet (4 mg total) by mouth every 8 (eight) hours as needed for nausea or vomiting. 12/20/21  Yes Jessi Pitstick, Raynelle Fanning, PA-C  oseltamivir (TAMIFLU) 75 MG capsule Take 1 capsule (75 mg total) by mouth every 12 (twelve) hours. 12/20/21  Yes Shereta Crothers, Raynelle Fanning, PA-C  amoxicillin (AMOXIL) 500 MG capsule Take 1 capsule (500 mg total) by mouth 3 (three) times daily. 02/29/16   Bethann Berkshire, MD  Aspirin-Salicylamide-Caffeine (BC HEADACHE) [phone] MG TABS Take 1 packet by mouth daily as needed (for pain).    [provider]  cyclobenzaprine (FLEXERIL) 10 MG tablet Take 1 tablet (10 mg  total) by mouth 2 (two) times daily as needed for muscle spasms. 05/09/19   Avegno, Zachery Dakins, FNP  guaiFENesin (MUCINEX) 600 MG 12 hr tablet Take 600 mg by mouth 2 (two) times daily as needed for cough or to loosen phlegm.    [provider]  hydrOXYzine (ATARAX/VISTARIL) 25 MG tablet Take one at bedtime as needed for sleep 02/29/16   Bethann Berkshire, MD  ibuprofen (ADVIL) 800 MG tablet Take 1 tablet (800 mg total) by mouth 3 (three) times daily. Take with food 05/09/19   Avegno, Zachery Dakins, FNP  predniSONE (STERAPRED UNI-PAK 21 TAB) 10 MG (21) TBPK tablet Take 6 tabs by mouth daily  for 2 days, then 5 tabs for 2 days, then 4 tabs for 2 days, then 3 tabs for 2 days, 2 tabs for 2 days, then 1 tab by mouth daily for 2 days 05/09/19   Durward Parcel, FNP  Pseudoeph-Doxylamine-DM-APAP (DAYQUIL/NYQUIL COLD/FLU RELIEF PO) Take 2 capsules by mouth daily as needed (for cold and cough).    [provider]      Allergies    Patient has no known allergies.    Review of Systems   Review of Systems  Constitutional:  Positive for fever. Negative for chills.  HENT:  Negative for congestion and sore throat.   Eyes: Negative.   Respiratory:  Positive for cough. Negative for chest tightness and shortness of breath.   Cardiovascular:  Negative for  chest pain.  Gastrointestinal:  Positive for diarrhea. Negative for abdominal pain and nausea.       Post tussive emesis  Genitourinary: Negative.   Musculoskeletal:  Positive for back pain and myalgias. Negative for arthralgias, joint swelling and neck pain.  Skin: Negative.  Negative for rash and wound.  Neurological:  Negative for dizziness, weakness, light-headedness, numbness and headaches.  Psychiatric/Behavioral: Negative.      Physical Exam Updated Vital Signs BP (!) 175/92   Pulse (!) 106   Temp 100.3 F (37.9 C) (Oral)   Resp (!) 29   SpO2 96%  Physical Exam Vitals and nursing note reviewed.  Constitutional:      Appearance:  He is well-developed.  HENT:     Head: Normocephalic and atraumatic.  Eyes:     Conjunctiva/sclera: Conjunctivae normal.  Cardiovascular:     Rate and Rhythm: Regular rhythm. Tachycardia present.     Heart sounds: Normal heart sounds.  Pulmonary:     Effort: Pulmonary effort is normal.     Breath sounds: Normal breath sounds. No wheezing.  Abdominal:     General: Bowel sounds are normal.     Palpations: Abdomen is soft.     Tenderness: There is no abdominal tenderness. There is left CVA tenderness. There is no guarding or rebound.  Musculoskeletal:        General: Normal range of motion.     Cervical back: Normal range of motion.  Skin:    General: Skin is warm and dry.  Neurological:     General: No focal deficit present.     Mental Status: He is alert.     ED Results / Procedures / Treatments   Labs (all labs ordered are listed, but only abnormal results are displayed) Labs Reviewed  RESP PANEL BY RT-PCR (FLU A&B, COVID) ARPGX2 - Abnormal; Notable for the following components:      Result Value   Influenza A by PCR POSITIVE (*)    All other components within normal limits  COMPREHENSIVE METABOLIC PANEL - Abnormal; Notable for the following components:   Potassium 3.4 (*)    Glucose, Bld 115 (*)    Calcium 8.5 (*)    Total Protein 8.2 (*)    Albumin 3.4 (*)    AST 113 (*)    ALT 71 (*)    All other components within normal limits  CBC WITH DIFFERENTIAL/PLATELET - Abnormal; Notable for the following components:   WBC 2.5 (*)    HCT 38.4 (*)    RDW 16.7 (*)    Platelets 128 (*)    Neutro Abs 1.1 (*)    Lymphs Abs 0.3 (*)    Monocytes Absolute 1.1 (*)    All other components within normal limits  URINALYSIS, ROUTINE W REFLEX MICROSCOPIC - Abnormal; Notable for the following components:   Ketones, ur 5 (*)    Protein, ur 30 (*)    All other components within normal limits  CULTURE, BLOOD (ROUTINE X 2)  CULTURE, BLOOD (ROUTINE X 2)  SARS CORONAVIRUS 2 BY RT PCR   LACTIC ACID, PLASMA  LACTIC ACID, PLASMA  PROTIME-INR  ACETAMINOPHEN LEVEL  HEPATITIS PANEL, ACUTE    EKG EKG Interpretation  Date/Time:  Tuesday December 20 2021 14:28:59 EDT Ventricular Rate:  121 PR Interval:  129 QRS Duration: 86 QT Interval:  327 QTC Calculation: 464 R Axis:   68 Text Interpretation: Sinus tachycardia Confirmed by Margaretmary Eddy [phone]) on 12/20/2021 3:38:29 PM  Radiology US Abdomen  Limited RUQ (LIVER/GB)  Result Date: 12/20/2021 CLINICAL DATA:  Right upper quadrant abdominal pain EXAM: ULTRASOUND ABDOMEN LIMITED RIGHT UPPER QUADRANT COMPARISON:  12/10/2020 FINDINGS: Gallbladder: Gallstones: None Sludge: None Gallbladder Wall: Within normal limits Pericholecystic fluid: None Sonographic Murphy's Sign: Negative per technologist Common bile duct: Diameter: 5 mm Liver: Parenchymal echogenicity: Within normal limits Contours: Normal Lesions: 1.4 x 0.9 x 1.2 cm simple cyst in the left liver lobe does not require further imaging follow-up. Previously seen ill-defined left liver mass is not identified on the current exam. Portal vein: Patent.  Hepatopetal flow Other: None. IMPRESSION: 1. No acute abnormality of the liver or gallbladder. 2. Previously seen 19 mm hypoechoic left liver mass is not well visualized on the current examination. This appeared to be a significant finding on the prior examination and is possibly missed on the current exam. Further evaluation with contrast enhanced MRI should still be considered as recommended on the prior study. Electronically Signed   By: Acquanetta Belling M.D.   On: 12/20/2021 16:13   DG Chest 2 View  Result Date: 12/20/2021 CLINICAL DATA:  Cough, fever. EXAM: CHEST - 2 VIEW COMPARISON:  February 29, 2016. FINDINGS: The heart size and mediastinal contours are within normal limits. Both lungs are clear. The visualized skeletal structures are unremarkable. IMPRESSION: No active cardiopulmonary disease. Electronically Signed   By: Lupita Raider M.D.   On: 12/20/2021 14:22    Procedures Procedures    Medications Ordered in ED Medications  acetaminophen (TYLENOL) tablet 650 mg (650 mg Oral Given 12/20/21 1405)  ondansetron (ZOFRAN-ODT) disintegrating tablet 4 mg (4 mg Oral Given 12/20/21 1405)  cefTRIAXone (ROCEPHIN) 1 g in sodium chloride 0.9 % 100 mL IVPB (0 g Intravenous Stopped 12/20/21 1528)  lactated ringers bolus 3,000 mL (3,000 mLs Intravenous New Bag/Given 12/20/21 1536)  ketorolac (TORADOL) 30 MG/ML injection 15 mg (15 mg Intravenous Given 12/20/21 1708)    ED Course/ Medical Decision Making/ A&P                           Medical Decision Making Amount and/or Complexity of Data Reviewed Labs: ordered. Radiology: ordered.  Risk OTC drugs. Prescription drug management.   Medical Decision Making Amount and/or Complexity of Data Reviewed Labs: ordered. Radiology: ordered.     This patient presents to the ED for concern of abdominal discomfort, this involves an extensive number of treatment options, and is a complaint that carries with it a high risk of complications and morbidity.  The differential diagnosis includes pneumonia, UTI, pyelonephritis, urosepsis.   Co morbidities that complicate the patient evaluation   Nephrectomy with possible renal insufficiency     Additional history obtained:   Additional history obtained from prior chart records including an abdominal ultrasound completed 1 year ago, secondary to elevation in liver function at that time.  Discussed elevated liver enzymes with patient.  He denies excessive Tylenol use, he has had no exposures to others with similar symptoms.  He does drink on average 6 alcoholic beverages per week on average.  He does endorse prior heavy alcohol use but states this was many years ago.     Lab Tests:   I Ordered, and personally interpreted labs.  The pertinent results include: Patient is positive for influenza A.  He has a mild leukopenia with a  WBC count of 2.5 which is consistent with a viral infection.  His platelets are reduced at 128, this is a chronic  finding for him.  He also has elevation in his LFTs, his AST is 113, his ALT is 71.  He does endorse EtOH intake approximately 6 beverages per week.  He has had elevated LFTs in the past as well.  He denies excessive Tylenol use.  An acute hepatitis panel has been ordered.     Imaging Studies ordered:   I ordered imaging studies including right upper quadrant ultrasound was ordered secondary to elevated LFTs, no acute abnormalities found.  However an ultrasound completed October 2022 was revealing for a left liver mass which has not visualized on today's exam.  An MRI was previously recommended, discussed with patient advised to discuss with his primary MD.  Chest x-ray was also ordered, no pneumonia.   I independently visualized and interpreted imaging which showed No pneumonia, no acute hepatitis. I agree with the radiologist interpretation     Cardiac Monitoring: / EKG:   The patient was maintained on a cardiac monitor.  I personally viewed and interpreted the cardiac monitored which showed an underlying rhythm of: Sinus tachycardia     Problem List / ED Course / Critical interventions / Medication management   ED course: Initially patient presented with concern of sepsis however his lactate was negative, blood cultures are currently pending.  His positive influenza certainly explains his symptoms and his presentation.  He has been comfortable during this ED stay.  He was given IV fluids according to sepsis protocol, he was also initially given a dose of Rocephin as there is concern about possible urosepsis.  Tylenol was also given upon first presentation for fever reduction. I ordered medication including Zofran, Rocephin, Toradol as patient was complaining of body aches. Reevaluation of the patient after these medicines showed that the patient stayed the same I have reviewed the  patients home medicines and have made adjustments as needed     Social Determinants of Health:   None     Test / Admission - Considered:   Considered admission to the hospital patient is very stable appearing, no indication for admission at this time.        Final Clinical Impression(s) / ED Diagnoses Final diagnoses:  Influenza A    Rx / DC Orders ED Discharge Orders          Ordered    oseltamivir (TAMIFLU) 75 MG capsule  Every 12 hours        12/20/21 1804    benzonatate (TESSALON) 100 MG capsule  3 times daily PRN        12/20/21 1804    ondansetron (ZOFRAN) 4 MG tablet  Every 8 hours PRN        12/20/21 1804              Burgess Amor, PA-C 12/20/21 1815    Rondel Baton, MD 12/20/21 2131

## 2021-12-20 NOTE — Discharge Instructions (Signed)
Rest,  Drink plenty of fluids.  Take ibuprofen (motrin or advil) for achiness and fever reduction.  You may take the tamiflu if you desire.  This medicine may improve your flu symptoms 1-2 days sooner.  Get rechecked for increased shortness of breath,  Increased fever or increasing weakness.  As discussed,  your liver enzymes are elevated today (as they have been in the past),  today your AST level is 113 and your ALT is 71.  Call your MD for a lab visit next week to recheck these numbers.

## 2021-12-20 NOTE — ED Notes (Signed)
191/104 is the first bp, hr was 124 182/108 is the second bp, hr was 125

## 2021-12-21 LAB — HEPATITIS PANEL, ACUTE
HCV Ab: REACTIVE — AB
Hep A IgM: NONREACTIVE
Hep B C IgM: NONREACTIVE
Hepatitis B Surface Ag: NONREACTIVE

## 2021-12-25 LAB — CULTURE, BLOOD (ROUTINE X 2)
Culture: NO GROWTH
Culture: NO GROWTH
Special Requests: ADEQUATE
Special Requests: ADEQUATE

## 2022-03-16 DIAGNOSIS — J069 Acute upper respiratory infection, unspecified: Secondary | ICD-10-CM | POA: Diagnosis not present

## 2022-03-16 DIAGNOSIS — J309 Allergic rhinitis, unspecified: Secondary | ICD-10-CM | POA: Diagnosis not present

## 2022-03-16 DIAGNOSIS — E663 Overweight: Secondary | ICD-10-CM | POA: Diagnosis not present

## 2022-03-16 DIAGNOSIS — J329 Chronic sinusitis, unspecified: Secondary | ICD-10-CM | POA: Diagnosis not present

## 2022-07-26 IMAGING — US US ABDOMEN COMPLETE
1 series · 13 of 25 positions shown · non-contrast
Comparison: None.

CLINICAL DATA: Abnormal liver function test

EXAM:
ABDOMEN ULTRASOUND COMPLETE

[Series 1: us abdomen complete · 13 of 136 slices shown]
[im 1/136]
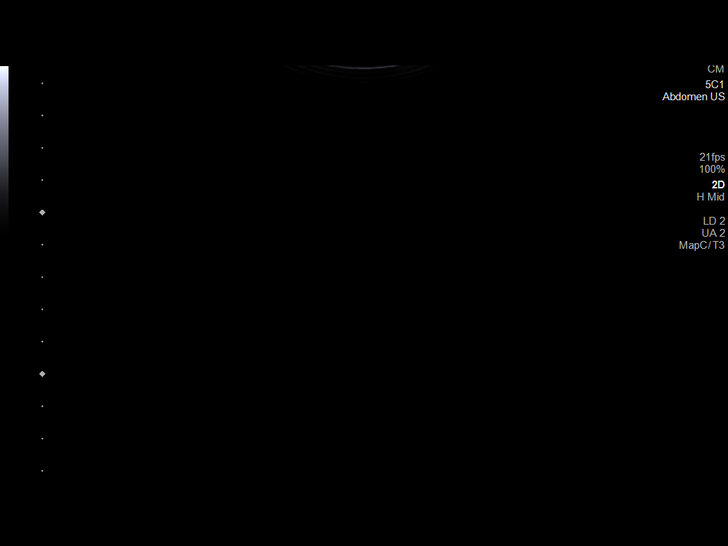
[im 12/136]
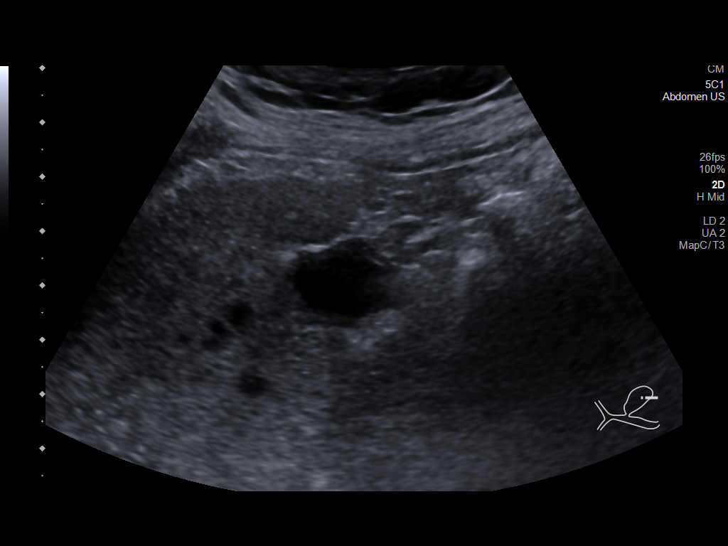
[im 23/136]
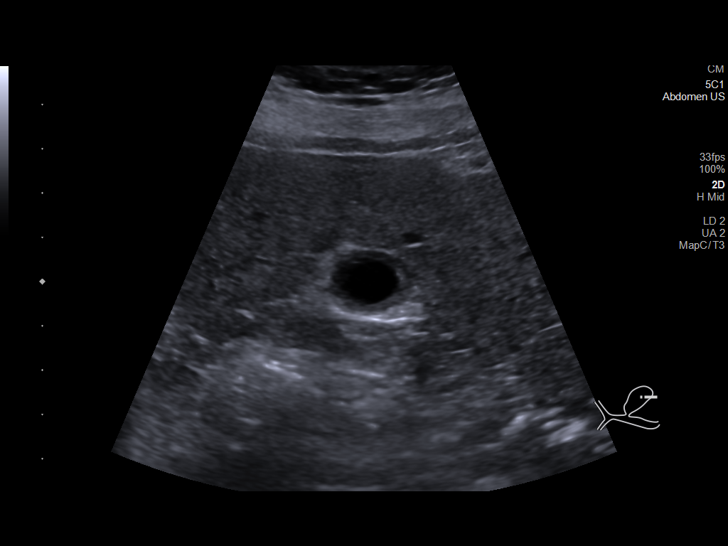
[im 34/136]
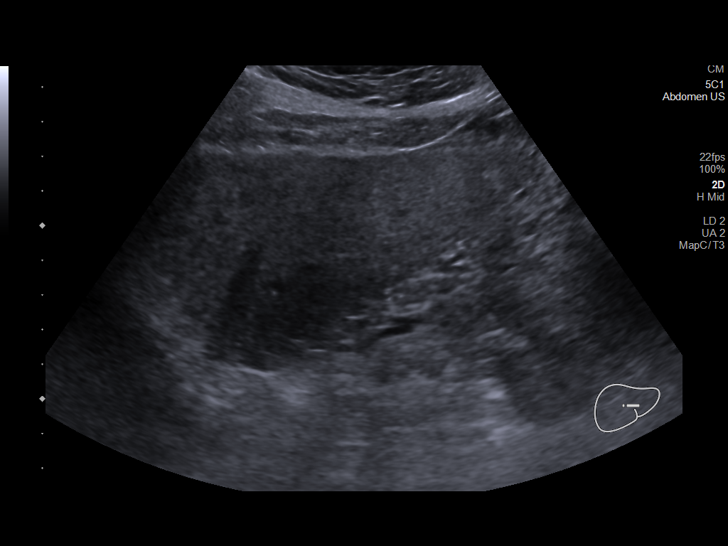
[im 46/136]
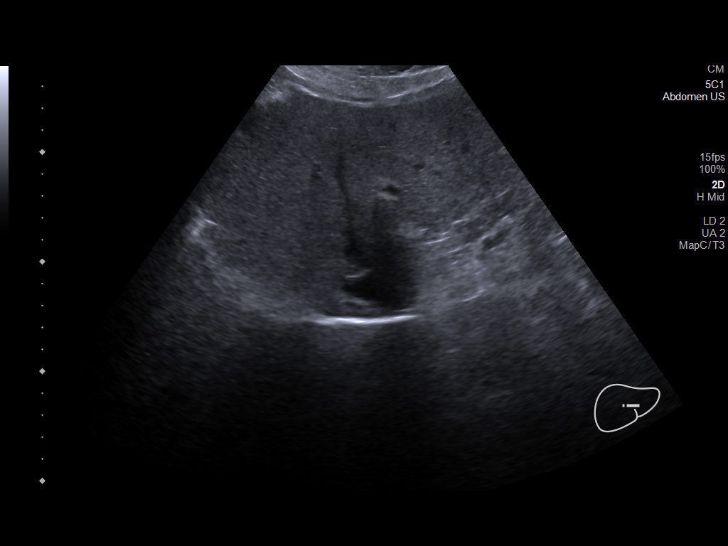
[im 57/136]
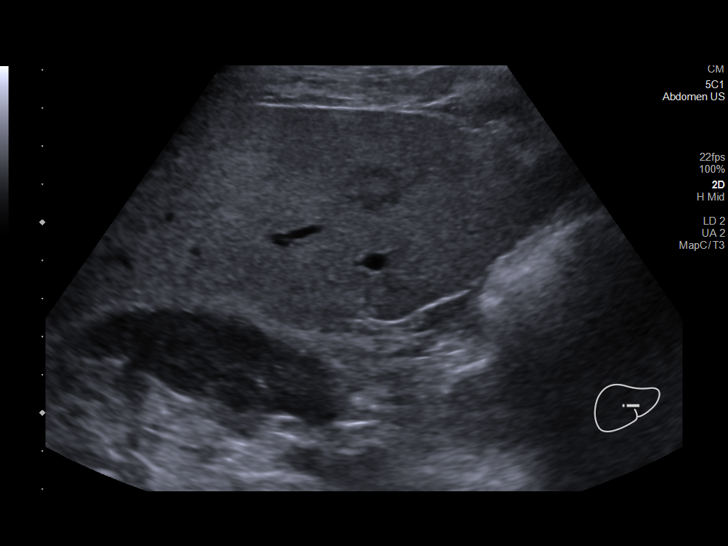
[im 68/136]
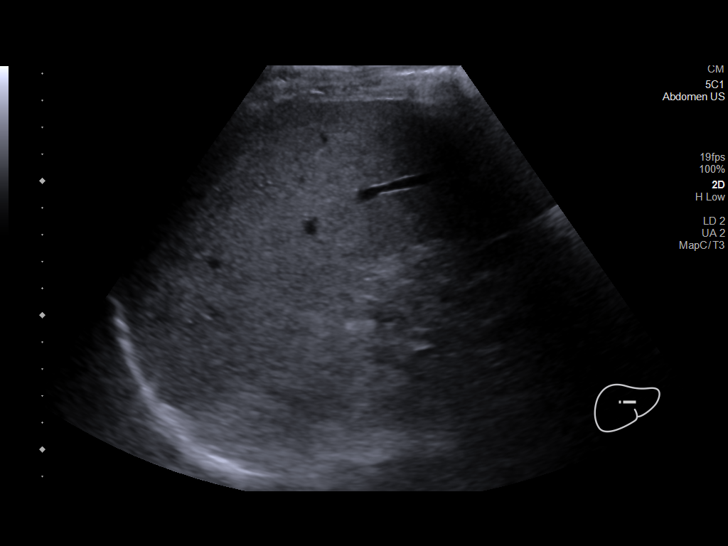
[im 79/136]
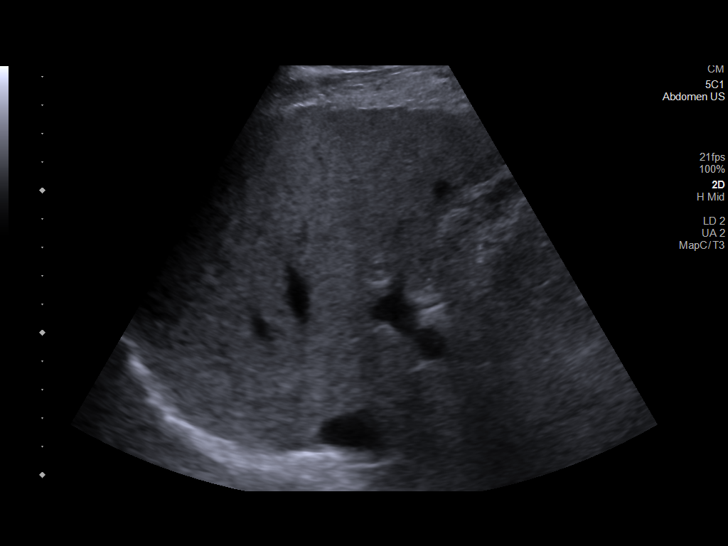
[im 91/136]
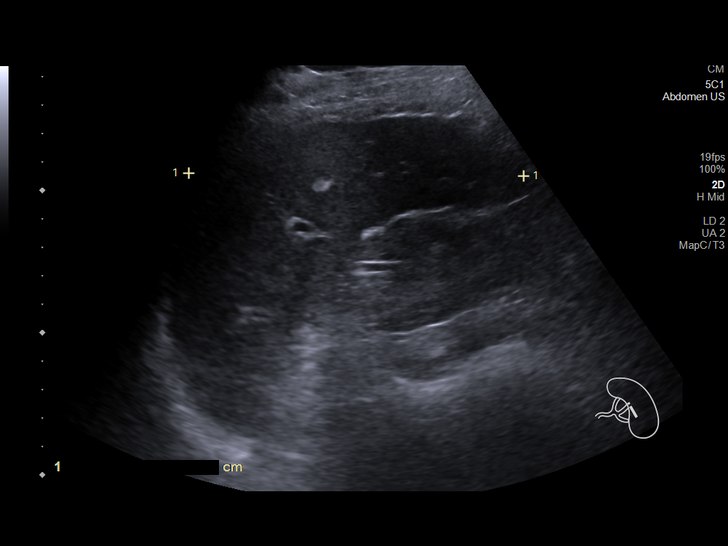
[im 102/136]
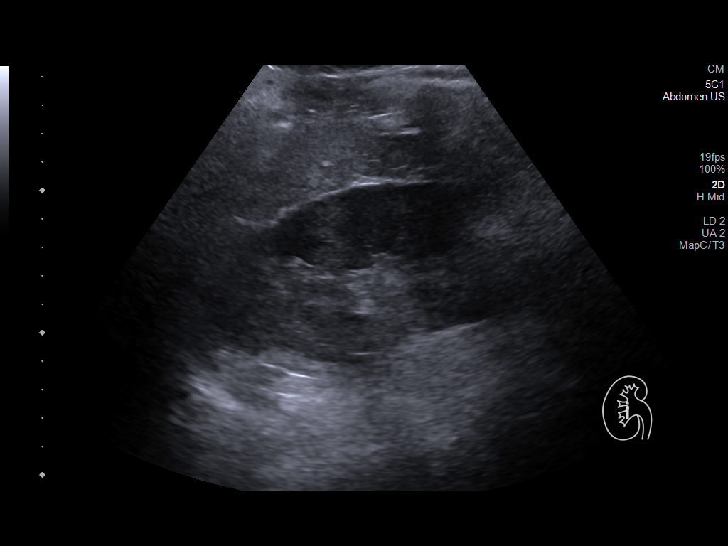
[im 113/136]
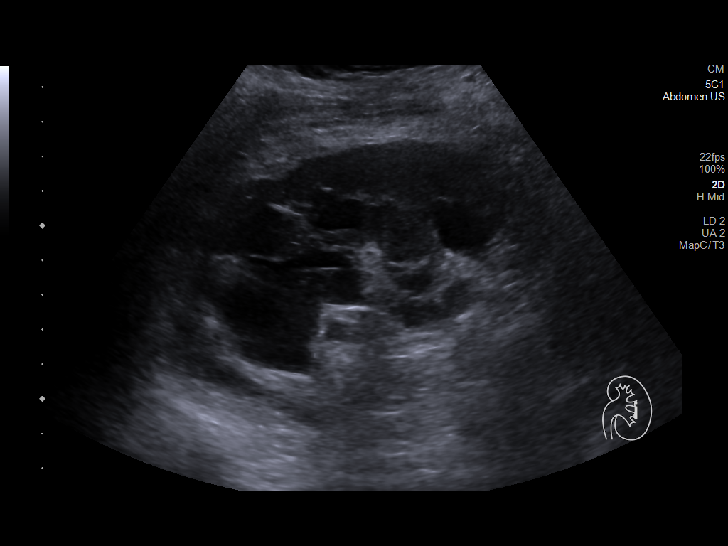
[im 124/136]
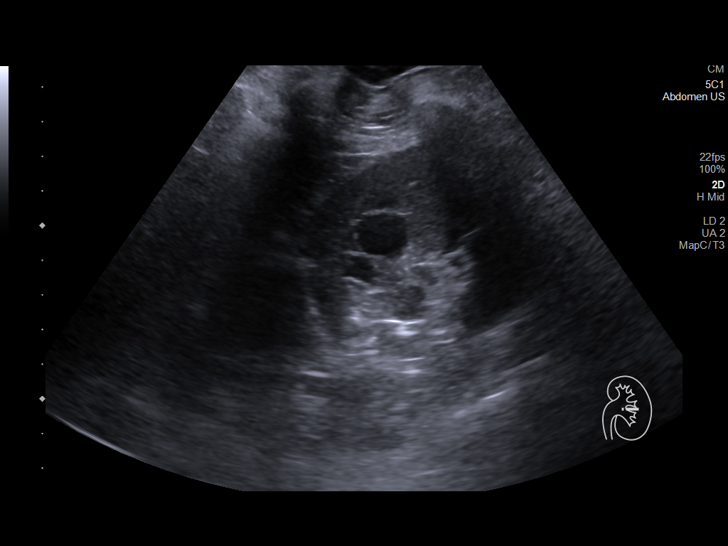
[im 136/136]
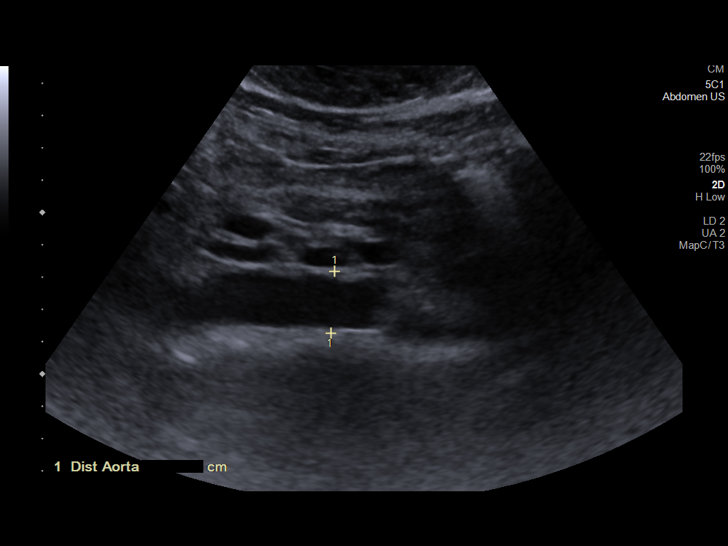

[13 of 25 positions shown; findings below may reference images not displayed]

FINDINGS: Gallbladder: Slightly contracted. No shadowing stones. Normal wall
thickness. Negative sonographic Murphy.

Common bile duct: Diameter: 4.5 mm

Liver: Diffusely echogenic. Ill-defined hypoechoic mass within the
right hepatic lobe measuring 19 x 12 x 16 mm. Portal vein is patent
on color Doppler imaging with normal direction of blood flow towards
the liver.

IVC: No abnormality visualized.

Pancreas: Visualized portion unremarkable.

Spleen: Size and appearance within normal limits.

Right Kidney: Length: 14.3 cm. Echogenicity within normal limits. No
mass or hydronephrosis visualized.

Left Kidney: Length: 11 cm. Echogenicity within normal limits.
Negative for mass. Moderate hydronephrosis

Abdominal aorta: No aneurysm visualized.

Other findings: None.
IMPRESSION: 1. Echogenic liver consistent with hepatic steatosis and or
hepatocellular disease. Indeterminate 19 mm hypoechoic mass within
the right hepatic lobe. Further evaluation with MRI is recommended.
2. Moderate left hydronephrosis

These results will be called to the ordering clinician or
representative by the Radiologist Assistant, and communication
documented in the PACS or [REDACTED].

## 2022-10-16 ENCOUNTER — Other Ambulatory Visit (INDEPENDENT_AMBULATORY_CARE_PROVIDER_SITE_OTHER): Payer: 59

## 2022-10-16 ENCOUNTER — Telehealth: Payer: 59 | Admitting: Orthopedic Surgery

## 2022-10-16 ENCOUNTER — Ambulatory Visit (INDEPENDENT_AMBULATORY_CARE_PROVIDER_SITE_OTHER): Payer: 59 | Admitting: Orthopedic Surgery

## 2022-10-16 DIAGNOSIS — M25531 Pain in right wrist: Secondary | ICD-10-CM | POA: Diagnosis not present

## 2022-10-16 DIAGNOSIS — M25831 Other specified joint disorders, right wrist: Secondary | ICD-10-CM | POA: Diagnosis not present

## 2022-10-16 NOTE — Telephone Encounter (Signed)
This was addressed with Dr.Ash and he will adjust note

## 2022-10-16 NOTE — H&P (View-Only) (Signed)
 Thomas [name] [facility] - 62 y.o. male MRN [id number]  Date of birth: December 29, 1960  Office Visit Note: Visit Date: 10/16/2022 PCP: Assunta Found, MD Referred by: Avis Epley, PA*  Subjective: Chief Complaint  Patient presents with   Right Wrist - Fracture, Pain   HPI: Thomas [name] is a pleasant 61 y.o. male who presents today for evaluation of ongoing right ulnar sided wrist pain after a prior right wrist fracture sustained in 2021.  He underwent nonoperative treatment at that time with a cast.  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  He is unable to currently perform his work tasks due to significant ulnar-sided wrist pain.  Has trialed bracing without lasting relief, remains painful and swollen.  At this point, given his chronic pain that continues to worsen, he is interested in potential surgical measures.  Visit Reason: right wrist pain  Hand dominance: right Occupation: Engineer, materials  Diabetic: No Heart/Lung History: none Blood Thinners: none  Prior Testing/EMG: none Injections (Date): none Treatments:brace, BC powder Prior Surgery: none  *fell off truck in March 2024; [employer] landing* Still painful/swollen Obvious deformity  Pertinent ROS were reviewed with the patient and found to be negative unless otherwise specified above in HPI.   Assessment & Plan: Visit Diagnoses:  1. Ulnar impaction syndrome, right   2. Pain in right wrist     Plan: Extensive discussion was had with the patient today regarding his right wrist.  X-rays obtained today show significant residual ulnar positivity with impaction that correlates with his clinical examination.  His prior distal radius fracture is healed in a shortened slightly dorsal angulated position.  Radiocarpal joint remains well-maintained.  Based on his ongoing symptoms and clinical/radiographic workup, patient is indicated for right wrist ulnar  shortening osteotomy.  Risks and benefits of the procedure were discussed, risks including but not limited to infection, bleeding, scarring, stiffness, nerve injury, vascular injury, hardware complication, malunion, nonunion, recurrence of symptoms and need for subsequent operation.  Patient expressed understanding.    Will move forward with surgical scheduling of right wrist ulnar shortening osteotomy.  Surgery can be performed under general anesthesia versus potential block.  Note was also provided today that he should not utilize the right wrist or hand currently for heavy lifting or impact activities given his ongoing dysfunction.  I like for him the postoperative protocol as well which will entail 6 weeks of immobilization, followed by range of motion protocol with progression to strengthening likely by week 8 postoperatively.  Our goal will be for him to return to activities as tolerated at the week 12 mark postoperatively.  Patient expressed full understanding.    Follow-up: No follow-ups on file.   Meds & Orders: No orders of the defined types were placed in this encounter.   Orders Placed This Encounter  Procedures   XR Wrist Complete Right     Procedures: No procedures performed      Clinical History: No specialty comments available.  He reports that he has never smoked. He has never used smokeless tobacco. No results for input(s): "HGBA1C", "LABURIC" in the last 8760 hours.  Objective:   Vital Signs: There were no vitals taken for this visit.  Physical Exam  Gen: Well-appearing, in no acute distress; non-toxic CV: Regular Rate. Well-perfused. Warm.  Resp: Breathing unlabored on room air; no wheezing. Psych: Fluid speech in conversation; appropriate affect; normal thought process Neuro: Sensation intact  throughout. No gross coordination deficits.   Ortho Exam PHYSICAL EXAM:  General: Patient is well appearing and in no distress. Cervical spine mobility is full in all  directions:  Skin and Muscle: No skin changes are apparent to upper extremities.  Muscle bulk and contour normal, no signs of atrophy.     Range of Motion and Palpation Tests: Mobility is full about the elbows with flexion and extension.  Wrist flexion/extension is 75/65 left side, right wrist flexion 55/45.  Unable to perform ulnar deviation of the right wrist due to significant impaction, significant pain associated.  Left wrist ulnar deviation 15 degrees without evidence of impaction or pain.  Digital flexion and extension are full.  Thumb opposition is full to the base of the small fingers bilaterally.    No cords or nodules are palpated.  No triggering is observed.     Neurologic, Vascular, Motor: Sensation is intact to light touch in the median/radial/ulnar distributions. Fingers pink and well perfused.  Capillary refill is brisk.      No results found for: "HGBA1C"   Imaging: XR Wrist Complete Right  Result Date: 10/16/2022 X-rays of the right wrist were obtained today, multiple views X-rays demonstrate prior distal radius fracture well-healed with shortening and slight dorsal angulation.  There is evidence of significant residual ulnar positivity with notable impaction, measurement approximately 9 mm of ulnar positivity.  Prior ulnar styloid fracture is also visualized.   Past Medical/Family/Surgical/Social History: Medications & Allergies reviewed per EMR, new medications updated. Patient Active Problem List   Diagnosis Date Noted   Thrombocytopenia (HCC) 04/12/2016   Past Medical History:  Diagnosis Date   Thrombocytopenia (HCC) 04/12/2016   Family History  Problem Relation Age of Onset   Healthy Mother    Cancer Father    Past Surgical History:  Procedure Laterality Date   renalectomy Left    Social History   Occupational History   Not on file  Tobacco Use   Smoking status: Never   Smokeless tobacco: Never  Substance and Sexual Activity   Alcohol use: Yes     Comment: weekly   Drug use: No   Sexual activity: Not on file    [name] [name], M.D. [facility] 2:59 PM

## 2022-10-16 NOTE — Telephone Encounter (Signed)
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 It is under the HPI section and the Occupation section right under it. Please advise

## 2022-10-16 NOTE — Progress Notes (Addendum)
Thomas [name] [facility] - 62 y.o. male MRN [id number]  Date of birth: December 29, 1960  Office Visit Note: Visit Date: 10/16/2022 PCP: Assunta Found, MD Referred by: Avis Epley, PA*  Subjective: Chief Complaint  Patient presents with   Right Wrist - Fracture, Pain   HPI: Thomas [name] is a pleasant 61 y.o. male who presents today for evaluation of ongoing right ulnar sided wrist pain after a prior right wrist fracture sustained in 2021.  He underwent nonoperative treatment at that time with a cast.  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  He is unable to currently perform his work tasks due to significant ulnar-sided wrist pain.  Has trialed bracing without lasting relief, remains painful and swollen.  At this point, given his chronic pain that continues to worsen, he is interested in potential surgical measures.  Visit Reason: right wrist pain  Hand dominance: right Occupation: Engineer, materials  Diabetic: No Heart/Lung History: none Blood Thinners: none  Prior Testing/EMG: none Injections (Date): none Treatments:brace, BC powder Prior Surgery: none  *fell off truck in March 2024; [employer] landing* Still painful/swollen Obvious deformity  Pertinent ROS were reviewed with the patient and found to be negative unless otherwise specified above in HPI.   Assessment & Plan: Visit Diagnoses:  1. Ulnar impaction syndrome, right   2. Pain in right wrist     Plan: Extensive discussion was had with the patient today regarding his right wrist.  X-rays obtained today show significant residual ulnar positivity with impaction that correlates with his clinical examination.  His prior distal radius fracture is healed in a shortened slightly dorsal angulated position.  Radiocarpal joint remains well-maintained.  Based on his ongoing symptoms and clinical/radiographic workup, patient is indicated for right wrist ulnar  shortening osteotomy.  Risks and benefits of the procedure were discussed, risks including but not limited to infection, bleeding, scarring, stiffness, nerve injury, vascular injury, hardware complication, malunion, nonunion, recurrence of symptoms and need for subsequent operation.  Patient expressed understanding.    Will move forward with surgical scheduling of right wrist ulnar shortening osteotomy.  Surgery can be performed under general anesthesia versus potential block.  Note was also provided today that he should not utilize the right wrist or hand currently for heavy lifting or impact activities given his ongoing dysfunction.  I like for him the postoperative protocol as well which will entail 6 weeks of immobilization, followed by range of motion protocol with progression to strengthening likely by week 8 postoperatively.  Our goal will be for him to return to activities as tolerated at the week 12 mark postoperatively.  Patient expressed full understanding.    Follow-up: No follow-ups on file.   Meds & Orders: No orders of the defined types were placed in this encounter.   Orders Placed This Encounter  Procedures   XR Wrist Complete Right     Procedures: No procedures performed      Clinical History: No specialty comments available.  He reports that he has never smoked. He has never used smokeless tobacco. No results for input(s): "HGBA1C", "LABURIC" in the last 8760 hours.  Objective:   Vital Signs: There were no vitals taken for this visit.  Physical Exam  Gen: Well-appearing, in no acute distress; non-toxic CV: Regular Rate. Well-perfused. Warm.  Resp: Breathing unlabored on room air; no wheezing. Psych: Fluid speech in conversation; appropriate affect; normal thought process Neuro: Sensation intact  throughout. No gross coordination deficits.   Ortho Exam PHYSICAL EXAM:  General: Patient is well appearing and in no distress. Cervical spine mobility is full in all  directions:  Skin and Muscle: No skin changes are apparent to upper extremities.  Muscle bulk and contour normal, no signs of atrophy.     Range of Motion and Palpation Tests: Mobility is full about the elbows with flexion and extension.  Wrist flexion/extension is 75/65 left side, right wrist flexion 55/45.  Unable to perform ulnar deviation of the right wrist due to significant impaction, significant pain associated.  Left wrist ulnar deviation 15 degrees without evidence of impaction or pain.  Digital flexion and extension are full.  Thumb opposition is full to the base of the small fingers bilaterally.    No cords or nodules are palpated.  No triggering is observed.     Neurologic, Vascular, Motor: Sensation is intact to light touch in the median/radial/ulnar distributions. Fingers pink and well perfused.  Capillary refill is brisk.      No results found for: "HGBA1C"   Imaging: XR Wrist Complete Right  Result Date: 10/16/2022 X-rays of the right wrist were obtained today, multiple views X-rays demonstrate prior distal radius fracture well-healed with shortening and slight dorsal angulation.  There is evidence of significant residual ulnar positivity with notable impaction, measurement approximately 9 mm of ulnar positivity.  Prior ulnar styloid fracture is also visualized.   Past Medical/Family/Surgical/Social History: Medications & Allergies reviewed per EMR, new medications updated. Patient Active Problem List   Diagnosis Date Noted   Thrombocytopenia (HCC) 04/12/2016   Past Medical History:  Diagnosis Date   Thrombocytopenia (HCC) 04/12/2016   Family History  Problem Relation Age of Onset   Healthy Mother    Cancer Father    Past Surgical History:  Procedure Laterality Date   renalectomy Left    Social History   Occupational History   Not on file  Tobacco Use   Smoking status: Never   Smokeless tobacco: Never  Substance and Sexual Activity   Alcohol use: Yes     Comment: weekly   Drug use: No   Sexual activity: Not on file    Thomas [name] Thomas [name], M.D. [facility] 2:59 PM

## 2022-10-18 ENCOUNTER — Encounter (HOSPITAL_BASED_OUTPATIENT_CLINIC_OR_DEPARTMENT_OTHER): Payer: Self-pay | Admitting: Orthopedic Surgery

## 2022-10-18 ENCOUNTER — Other Ambulatory Visit: Payer: Self-pay

## 2022-10-19 ENCOUNTER — Telehealth: Payer: Self-pay | Admitting: Orthopedic Surgery

## 2022-10-19 NOTE — Telephone Encounter (Signed)
Pt called in stating his disability company is waiting on [place] to complete the attending physicians statement and fax it back please advise agent said she reached out, patient cannot receive benefits until paperwork is complete

## 2022-10-19 NOTE — Telephone Encounter (Signed)
IC, spoke with patient. Advised him that he spoke with Darl Pikes with [employer] on 8/20 and he was advised that he needed to pay the form fee and complete authorization. He stated that Principal told him that he didn't need to do that. While we were on the phone, he stated Principal was calling on the other line and he was taking the call. He is aware that in order for the forms to be completed, he will need to complete auth and pay the form fee.

## 2022-10-23 ENCOUNTER — Other Ambulatory Visit: Payer: Self-pay | Admitting: Orthopedic Surgery

## 2022-10-23 ENCOUNTER — Telehealth: Payer: Self-pay | Admitting: Orthopedic Surgery

## 2022-10-23 DIAGNOSIS — M25831 Other specified joint disorders, right wrist: Secondary | ICD-10-CM

## 2022-10-23 NOTE — Telephone Encounter (Signed)
Principal forms received. To [employer].

## 2022-10-24 ENCOUNTER — Encounter (HOSPITAL_BASED_OUTPATIENT_CLINIC_OR_DEPARTMENT_OTHER)
Admission: RE | Disposition: A | Discharge status: Home / Self Care | Payer: Self-pay | Source: Ambulatory Visit | Attending: Orthopedic Surgery

## 2022-10-24 ENCOUNTER — Other Ambulatory Visit: Payer: Self-pay

## 2022-10-24 ENCOUNTER — Ambulatory Visit (HOSPITAL_BASED_OUTPATIENT_CLINIC_OR_DEPARTMENT_OTHER): Payer: 59 | Admitting: Anesthesiology

## 2022-10-24 ENCOUNTER — Encounter (HOSPITAL_BASED_OUTPATIENT_CLINIC_OR_DEPARTMENT_OTHER): Payer: Self-pay | Admitting: Orthopedic Surgery

## 2022-10-24 ENCOUNTER — Ambulatory Visit (HOSPITAL_BASED_OUTPATIENT_CLINIC_OR_DEPARTMENT_OTHER)
Admission: RE | Admit: 2022-10-24 | Discharge: 2022-10-24 | Disposition: A | Discharge status: Home / Self Care | Payer: 59 | Source: Ambulatory Visit | Attending: Orthopedic Surgery | Admitting: Orthopedic Surgery

## 2022-10-24 ENCOUNTER — Ambulatory Visit (HOSPITAL_BASED_OUTPATIENT_CLINIC_OR_DEPARTMENT_OTHER): Payer: 59

## 2022-10-24 DIAGNOSIS — M25831 Other specified joint disorders, right wrist: Secondary | ICD-10-CM | POA: Diagnosis not present

## 2022-10-24 DIAGNOSIS — K219 Gastro-esophageal reflux disease without esophagitis: Secondary | ICD-10-CM | POA: Insufficient documentation

## 2022-10-24 DIAGNOSIS — I10 Essential (primary) hypertension: Secondary | ICD-10-CM | POA: Insufficient documentation

## 2022-10-24 DIAGNOSIS — M24831 Other specific joint derangements of right wrist, not elsewhere classified: Secondary | ICD-10-CM | POA: Diagnosis present

## 2022-10-24 HISTORY — DX: Personal history of urinary calculi: Z87.442

## 2022-10-24 HISTORY — DX: Gastro-esophageal reflux disease without esophagitis: K21.9

## 2022-10-24 HISTORY — DX: Essential (primary) hypertension: I10

## 2022-10-24 HISTORY — PX: WRIST OSTEOTOMY: SHX1093

## 2022-10-24 SURGERY — WRIST OSTEOTOMY
Anesthesia: Monitor Anesthesia Care | Site: Wrist | Laterality: Right

## 2022-10-24 MED ORDER — EPHEDRINE 5 MG/ML INJ
INTRAVENOUS | Status: AC
  Filled 2022-10-24: qty 5

## 2022-10-24 MED ORDER — DIPHENHYDRAMINE HCL 50 MG/ML IJ SOLN
INTRAMUSCULAR | Status: AC
  Filled 2022-10-24: qty 1

## 2022-10-24 MED ORDER — OXYCODONE HCL 5 MG/5ML PO SOLN: 5.0000 mg | Freq: Once | ORAL | Status: DC | PRN

## 2022-10-24 MED ORDER — LIDOCAINE 2% (20 MG/ML) 5 ML SYRINGE
INTRAMUSCULAR | Status: DC | PRN
  Administered 2022-10-24: 20 mg via INTRAVENOUS

## 2022-10-24 MED ORDER — MIDAZOLAM HCL 5 MG/5ML IJ SOLN
INTRAMUSCULAR | Status: DC | PRN
  Administered 2022-10-24: 1 mg via INTRAVENOUS

## 2022-10-24 MED ORDER — MIDAZOLAM HCL 2 MG/2ML IJ SOLN
INTRAMUSCULAR | Status: AC
  Filled 2022-10-24: qty 2

## 2022-10-24 MED ORDER — MIDAZOLAM HCL 2 MG/2ML IJ SOLN
2.0000 mg | Freq: Once | INTRAMUSCULAR | Status: AC
  Administered 2022-10-24: 2 mg via INTRAVENOUS

## 2022-10-24 MED ORDER — FENTANYL CITRATE (PF) 100 MCG/2ML IJ SOLN
INTRAMUSCULAR | Status: AC
  Filled 2022-10-24: qty 2

## 2022-10-24 MED ORDER — SUCCINYLCHOLINE CHLORIDE 200 MG/10ML IV SOSY
PREFILLED_SYRINGE | INTRAVENOUS | Status: AC
  Filled 2022-10-24: qty 10

## 2022-10-24 MED ORDER — ROPIVACAINE HCL 5 MG/ML IJ SOLN
INTRAMUSCULAR | Status: DC | PRN
Start: 2022-10-24 — End: 2022-10-24
  Administered 2022-10-24: 30 mL via PERINEURAL

## 2022-10-24 MED ORDER — PHENYLEPHRINE 80 MCG/ML (10ML) SYRINGE FOR IV PUSH (FOR BLOOD PRESSURE SUPPORT)
PREFILLED_SYRINGE | INTRAVENOUS | Status: AC
  Filled 2022-10-24: qty 10

## 2022-10-24 MED ORDER — OXYCODONE HCL 5 MG PO TABS: 5.0000 mg | ORAL_TABLET | Freq: Four times a day (QID) | ORAL | 0 refills | Status: DC | PRN

## 2022-10-24 MED ORDER — 0.9 % SODIUM CHLORIDE (POUR BTL) OPTIME
TOPICAL | Status: DC | PRN
Start: 2022-10-24 — End: 2022-10-24
  Administered 2022-10-24: 500 mL

## 2022-10-24 MED ORDER — ATROPINE SULFATE 0.4 MG/ML IV SOLN
INTRAVENOUS | Status: AC
  Filled 2022-10-24: qty 1

## 2022-10-24 MED ORDER — FENTANYL CITRATE (PF) 100 MCG/2ML IJ SOLN
INTRAMUSCULAR | Status: DC | PRN
  Administered 2022-10-24: 50 ug via INTRAVENOUS

## 2022-10-24 MED ORDER — DIPHENHYDRAMINE HCL 50 MG/ML IJ SOLN
INTRAMUSCULAR | Status: DC | PRN
  Administered 2022-10-24: 6.25 mg via INTRAVENOUS

## 2022-10-24 MED ORDER — OXYCODONE HCL 5 MG PO TABS: 5.0000 mg | ORAL_TABLET | Freq: Once | ORAL | Status: DC | PRN

## 2022-10-24 MED ORDER — PROPOFOL 500 MG/50ML IV EMUL
INTRAVENOUS | Status: DC | PRN
  Administered 2022-10-24: 125 ug/kg/min via INTRAVENOUS

## 2022-10-24 MED ORDER — CEFAZOLIN SODIUM-DEXTROSE 2-4 GM/100ML-% IV SOLN
INTRAVENOUS | Status: AC
  Filled 2022-10-24: qty 100

## 2022-10-24 MED ORDER — CEFAZOLIN SODIUM-DEXTROSE 2-4 GM/100ML-% IV SOLN
2.0000 g | INTRAVENOUS | Status: AC
  Administered 2022-10-24: 2 g via INTRAVENOUS

## 2022-10-24 MED ORDER — ONDANSETRON HCL 4 MG/2ML IJ SOLN
INTRAMUSCULAR | Status: AC
  Filled 2022-10-24: qty 2

## 2022-10-24 MED ORDER — LIDOCAINE 2% (20 MG/ML) 5 ML SYRINGE
INTRAMUSCULAR | Status: AC
  Filled 2022-10-24: qty 5

## 2022-10-24 MED ORDER — PROMETHAZINE HCL 25 MG/ML IJ SOLN: 6.2500 mg | INTRAMUSCULAR | Status: DC | PRN

## 2022-10-24 MED ORDER — HYDROMORPHONE HCL 1 MG/ML IJ SOLN: 0.2500 mg | INTRAMUSCULAR | Status: DC | PRN

## 2022-10-24 MED ORDER — ONDANSETRON HCL 4 MG/2ML IJ SOLN
INTRAMUSCULAR | Status: DC | PRN
Start: 2022-10-24 — End: 2022-10-24
  Administered 2022-10-24: 4 mg via INTRAVENOUS

## 2022-10-24 MED ORDER — FENTANYL CITRATE (PF) 100 MCG/2ML IJ SOLN
100.0000 ug | Freq: Once | INTRAMUSCULAR | Status: AC
  Administered 2022-10-24: 100 ug via INTRAVENOUS

## 2022-10-24 MED ORDER — LACTATED RINGERS IV SOLN: INTRAVENOUS | Status: DC

## 2022-10-24 SURGICAL SUPPLY — 74 items
APL PRP 5X4 STRL LF DISP 70% (MISCELLANEOUS)
APL PRP STRL LF DISP 70% ISPRP (MISCELLANEOUS) ×1
APPLICATOR CHLORAPREP 3ML ORNG (MISCELLANEOUS) ×1 IMPLANT
BIT DRILL SOLID SSC 2.7X40 (DRILL) IMPLANT
BLADE AVERAGE 25X9 (BLADE) IMPLANT
BLADE MICRO SAGITTAL (BLADE) IMPLANT
BLADE SURG 15 STRL LF DISP TIS (BLADE) ×2 IMPLANT
BLADE SURG 15 STRL SS (BLADE) ×2
BNDG CMPR 5X3 KNIT ELC UNQ LF (GAUZE/BANDAGES/DRESSINGS)
BNDG CMPR 5X4 CHSV STRCH STRL (GAUZE/BANDAGES/DRESSINGS) ×1
BNDG CMPR 5X4 KNIT ELC UNQ LF (GAUZE/BANDAGES/DRESSINGS) ×2
BNDG CMPR 75X21 PLY HI ABS (MISCELLANEOUS) ×1
BNDG CMPR 9X4 STRL LF SNTH (GAUZE/BANDAGES/DRESSINGS) ×1
BNDG COHESIVE 4X5 TAN STRL LF (GAUZE/BANDAGES/DRESSINGS) ×1 IMPLANT
BNDG ELASTIC 3INX 5YD STR LF (GAUZE/BANDAGES/DRESSINGS) IMPLANT
BNDG ELASTIC 4INX 5YD STR LF (GAUZE/BANDAGES/DRESSINGS) ×2 IMPLANT
BNDG ESMARK 4X9 LF (GAUZE/BANDAGES/DRESSINGS) ×1 IMPLANT
CHLORAPREP W/TINT 26 (MISCELLANEOUS) IMPLANT
CORD BIPOLAR FORCEPS 12FT (ELECTRODE) ×1 IMPLANT
COVER BACK TABLE 60X90IN (DRAPES) ×1 IMPLANT
CUFF TOURN SGL QUICK 18X4 (TOURNIQUET CUFF) IMPLANT
CUFF TOURN SGL QUICK 24 (TOURNIQUET CUFF)
CUFF TRNQT CYL 24X4X16.5-23 (TOURNIQUET CUFF) ×1 IMPLANT
DRAPE EXTREMITY T 121X128X90 (DISPOSABLE) ×1 IMPLANT
DRAPE IMP U-DRAPE 54X76 (DRAPES) ×1 IMPLANT
DRAPE OEC MINIVIEW 54X84 (DRAPES) ×1 IMPLANT
DRAPE SURG 17X23 STRL (DRAPES) ×1 IMPLANT
DRAPE U-SHAPE 47X51 STRL (DRAPES) IMPLANT
DRILL SOLID SSC 2.7X40 (DRILL) ×1 IMPLANT
DRIVER QUICK CONNECT T10 (MISCELLANEOUS) IMPLANT
GAUZE PAD ABD 8X10 STRL (GAUZE/BANDAGES/DRESSINGS) ×1 IMPLANT
GAUZE SPONGE 4X4 12PLY STRL (GAUZE/BANDAGES/DRESSINGS) ×1 IMPLANT
GAUZE STRETCH 2X75IN STRL (MISCELLANEOUS) ×1 IMPLANT
GAUZE XEROFORM 1X8 LF (GAUZE/BANDAGES/DRESSINGS) ×1 IMPLANT
GAUZE XEROFORM 5X9 LF (GAUZE/BANDAGES/DRESSINGS) IMPLANT
GLOVE BIO SURGEON STRL SZ7.5 (GLOVE) ×2 IMPLANT
GLOVE BIOGEL PI IND STRL 7.0 (GLOVE) IMPLANT
GLOVE BIOGEL PI IND STRL 7.5 (GLOVE) ×1 IMPLANT
GLOVE BIOGEL PI IND STRL 8 (GLOVE) IMPLANT
GLOVE SURG SYN 7.5 E (GLOVE) ×1 IMPLANT
GLOVE SURG SYN 7.5 PF PI (GLOVE) IMPLANT
GOWN STRL REUS W/ TWL LRG LVL3 (GOWN DISPOSABLE) ×1 IMPLANT
GOWN STRL REUS W/ TWL XL LVL3 (GOWN DISPOSABLE) IMPLANT
GOWN STRL REUS W/TWL LRG LVL3 (GOWN DISPOSABLE)
GOWN STRL REUS W/TWL XL LVL3 (GOWN DISPOSABLE) ×3 IMPLANT
KWIRE 1.6X127 (WIRE) IMPLANT
MANIFOLD NEPTUNE II (INSTRUMENTS) ×1 IMPLANT
NDL HYPO 25X1 1.5 SAFETY (NEEDLE) IMPLANT
NEEDLE HYPO 25X1 1.5 SAFETY (NEEDLE) IMPLANT
NS IRRIG 1000ML POUR BTL (IV SOLUTION) IMPLANT
PACK BASIN DAY SURGERY FS (CUSTOM PROCEDURE TRAY) ×1 IMPLANT
PAD CAST 4YDX4 CTTN HI CHSV (CAST SUPPLIES) ×2 IMPLANT
PADDING CAST COTTON 4X4 STRL (CAST SUPPLIES) ×2
PLATE ULNAR SHORTENING PROX (Plate) IMPLANT
SCREW CORT LOCK 3.5X22 (Screw) IMPLANT
SCREW CORT NLOCK PA 3.5X16 (Screw) IMPLANT
SCREW CORT NLOCK PA 3.5X20 (Screw) IMPLANT
SHEET MEDIUM DRAPE 40X70 STRL (DRAPES) ×1 IMPLANT
SLEEVE SCD COMPRESS KNEE MED (STOCKING) ×1 IMPLANT
SPIKE FLUID TRANSFER (MISCELLANEOUS) IMPLANT
SPLINT PLASTER CAST XFAST 4X15 (CAST SUPPLIES) ×10 IMPLANT
STOCKINETTE IMPERVIOUS LG (DRAPES) ×1 IMPLANT
STRAP SET WRIST TOWER ACUMED (MISCELLANEOUS) IMPLANT
SUCTION TUBE FRAZIER 10FR DISP (SUCTIONS) IMPLANT
SUT ETHILON 4 0 PS 2 18 (SUTURE) ×1 IMPLANT
SUT MNCRL AB 3-0 PS2 27 (SUTURE) ×1 IMPLANT
SUT VIC AB 3-0 PS2 18 (SUTURE) ×1
SUT VIC AB 3-0 PS2 18XBRD (SUTURE) IMPLANT
SUT VIC AB 4-0 PS2 18 (SUTURE) IMPLANT
SYR BULB EAR ULCER 3OZ GRN STR (SYRINGE) ×2 IMPLANT
SYR CONTROL 10ML LL (SYRINGE) IMPLANT
TOWEL GREEN STERILE FF (TOWEL DISPOSABLE) ×2 IMPLANT
TRAP FINGER LRG (INSTRUMENTS) IMPLANT
TUBE CONNECTING 20X1/4 (TUBING) IMPLANT

## 2022-10-24 NOTE — Anesthesia Postprocedure Evaluation (Signed)
Anesthesia Post Note  Patient: Thomas [name] [facility]  Procedure(s) Performed: RIGHT WRIST ULNAR SHORTENING OSTEOTOMY (Right: Wrist)     Patient location during evaluation: PACU Anesthesia Type: MAC Level of consciousness: awake and alert Pain management: pain level controlled Vital Signs Assessment: post-procedure vital signs reviewed and stable Respiratory status: spontaneous breathing, nonlabored ventilation and respiratory function stable Cardiovascular status: blood pressure returned to baseline and stable Postop Assessment: no apparent nausea or vomiting Anesthetic complications: no   No notable events documented.  Last Vitals:  Vitals:   10/24/22 1145 10/24/22 1205  BP: (!) 158/78 (!) 141/93  Pulse:  61  Resp:    Temp:  (!) 36.3 C  SpO2:  95%    Last Pain:  Vitals:   10/24/22 1205  TempSrc: Oral  PainSc: 0-No pain                 Lowella Curb

## 2022-10-24 NOTE — Anesthesia Procedure Notes (Signed)
Anesthesia Regional Block: Supraclavicular block   Pre-Anesthetic Checklist: , timeout performed,  Correct Patient, Correct Site, Correct Laterality,  Correct Procedure, Correct Position, site marked,  Risks and benefits discussed,  Surgical consent,  Pre-op evaluation,  At surgeon's request and post-op pain management  Laterality: Right  Prep: chloraprep       Needles:  Injection technique: Single-shot  Needle Type: Stimiplex     Needle Length: 9cm  Needle Gauge: 21     Additional Needles:   Procedures:,,,, ultrasound used (permanent image in chart),,    Narrative:  Start time: 10/24/2022 8:14 AM End time: 10/24/2022 8:19 AM Injection made incrementally with aspirations every 5 mL.  Performed by: Personally  Anesthesiologist: Lowella Curb, MD

## 2022-10-24 NOTE — Transfer of Care (Signed)
Immediate Anesthesia Transfer of Care Note  Patient: Colbi Mulry [facility]  Procedure(s) Performed: RIGHT WRIST ULNAR SHORTENING OSTEOTOMY (Right: Wrist)  Patient Location: PACU  Anesthesia Type:MAC combined with regional for post-op pain  Level of Consciousness: awake, alert , oriented, drowsy, and patient cooperative  Airway & Oxygen Therapy: Patient Spontanous Breathing and Patient connected to face mask oxygen  Post-op Assessment: Report given to RN and Post -op Vital signs reviewed and stable  Post vital signs: Reviewed and stable  Last Vitals:  Vitals Value Taken Time  BP    Temp    Pulse 84 10/24/22 1127  Resp 18 10/24/22 1127  SpO2 95 % 10/24/22 1127  Vitals shown include unfiled device data.  Last Pain:  Vitals:   10/24/22 0740  TempSrc: Oral  PainSc: 0-No pain      Patients Stated Pain Goal: 5 (10/24/22 0740)  Complications: No notable events documented.

## 2022-10-24 NOTE — Interval H&P Note (Signed)
History and Physical Interval Note:  10/24/2022 9:09 AM  Thomas [name]  has presented today for surgery, with the diagnosis of RIGHT WRIST ULNAR IMPACTION SYNDROME.  The various methods of treatment have been discussed with the patient and family. After consideration of risks, benefits and other options for treatment, the patient has consented to  Procedure(s): RIGHT WRIST ULNAR SHORTENING OSTEOTOMY (Right) as a surgical intervention.  The patient's history has been reviewed, patient examined, no change in status, stable for surgery.  I have reviewed the patient's chart and labs.  Questions were answered to the patient's satisfaction.     [name]

## 2022-10-24 NOTE — Discharge Instructions (Addendum)
 Hand Surgery Postop Instructions   Dressings: Maintain postoperative dressing until orthopedic follow-up.  Keep operative site clean and dry until orthopedic follow-up.  Wound Care: Keep your hand elevated above the level of your heart.  Do not allow it to dangle by your side. Moving your fingers is advised to stimulate circulation but will depend on the site of your surgery.  If you have a splint applied, your doctor will advise you regarding movement.  Activity: Do not drive or operate machinery until clearance given from physician. No heavy lifting with operative extremity.  Diet:  Drink liquids today or eat a light diet.  You may resume a regular diet tomorrow.    General expectations: Take prescribed medication if given, transition to over-the-counter medication as quickly as possible. Fingers may become slightly swollen.  Call your doctor if any of the following occur: Severe pain not relieved by pain medication. Elevated temperature. Dressing soaked with blood. Inability to move fingers. White or bluish color to fingers.  Post Anesthesia Home Care Instructions  Activity: Get plenty of rest for the remainder of the day. A responsible individual must stay with you for 24 hours following the procedure.  For the next 24 hours, DO NOT: -Drive a car -Advertising copywriter -Drink alcoholic beverages -Take any medication unless instructed by your physician -Make any legal decisions or sign important papers.  Meals: Start with liquid foods such as gelatin or soup. Progress to regular foods as tolerated. Avoid greasy, spicy, heavy foods. If nausea and/or vomiting occur, drink only clear liquids until the nausea and/or vomiting subsides. Call your physician if vomiting continues.  Special Instructions/Symptoms: Your throat may feel dry or sore from the anesthesia or the breathing tube placed in your throat during surgery. If this causes discomfort, gargle with warm salt water. The  discomfort should disappear within 24 hours.  If you had a scopolamine patch placed behind your ear for the management of post- operative nausea and/or vomiting:  1. The medication in the patch is effective for 72 hours, after which it should be removed.  Wrap patch in a tissue and discard in the trash. Wash hands thoroughly with soap and water. 2. You may remove the patch earlier than 72 hours if you experience unpleasant side effects which may include dry mouth, dizziness or visual disturbances. 3. Avoid touching the patch. Wash your hands with soap and water after contact with the patch.

## 2022-10-24 NOTE — Progress Notes (Signed)
Assisted Dr. Sabra Heck with right, supraclavicular, ultrasound guided block. Side rails up, monitors on throughout procedure. See vital signs in flow sheet. Tolerated Procedure well.

## 2022-10-24 NOTE — Anesthesia Preprocedure Evaluation (Signed)
Anesthesia Evaluation  Patient identified by MRN, date of birth, ID band Patient awake    Reviewed: Allergy & Precautions, H&P , NPO status , Patient's Chart, lab work & pertinent test results  Airway Mallampati: II  TM Distance: >3 FB Neck ROM: Full    Dental no notable dental hx.    Pulmonary neg pulmonary ROS   Pulmonary exam normal breath sounds clear to auscultation       Cardiovascular hypertension, Pt. on medications negative cardio ROS Normal cardiovascular exam Rhythm:Regular Rate:Normal     Neuro/Psych negative neurological ROS  negative psych ROS   GI/Hepatic negative GI ROS, Neg liver ROS,GERD  ,,  Endo/Other  negative endocrine ROS    Renal/GU negative Renal ROS  negative genitourinary   Musculoskeletal negative musculoskeletal ROS (+)    Abdominal   Peds negative pediatric ROS (+)  Hematology negative hematology ROS (+)   Anesthesia Other Findings   Reproductive/Obstetrics negative OB ROS                             Anesthesia Physical Anesthesia Plan  ASA: 2  Anesthesia Plan: MAC   Post-op Pain Management: Regional block* and Minimal or no pain anticipated   Induction: Intravenous  PONV Risk Score and Plan: 1 and Ondansetron and Treatment may vary due to age or medical condition  Airway Management Planned: Simple Face Mask  Additional Equipment:   Intra-op Plan:   Post-operative Plan:   Informed Consent: I have reviewed the patients History and Physical, chart, labs and discussed the procedure including the risks, benefits and alternatives for the proposed anesthesia with the patient or authorized representative who has indicated his/her understanding and acceptance.     Dental advisory given  Plan Discussed with: CRNA  Anesthesia Plan Comments:        Anesthesia Quick Evaluation

## 2022-10-24 NOTE — Op Note (Signed)
NAME: Thomas [name] [facility] MEDICAL RECORD NO: [id number] DATE OF BIRTH: 13-Mar-1960 FACILITY: [facility] LOCATION: [facility] PHYSICIAN: Samuella Cota, MD   OPERATIVE REPORT   DATE OF PROCEDURE: 10/24/22    PREOPERATIVE DIAGNOSIS: Right ulnar abutment syndrome   POSTOPERATIVE DIAGNOSIS: Right ulnar abutment syndrome   PROCEDURE: Right ulnar shortening osteotomy Short arm splint application   SURGEON:   Samuella Cota, M.D. Huel Cote. M.D.   ASSISTANT:   ANESTHESIA:  Regional with sedation   INTRAVENOUS FLUIDS:  Per anesthesia flow sheet.   ESTIMATED BLOOD LOSS:  Minimal.   COMPLICATIONS:  None.   SPECIMENS:  none   TOURNIQUET TIME:    Total Tourniquet Time Documented: Upper Arm (Right) - 46 minutes Total: Upper Arm (Right) - 46 minutes    DISPOSITION:  Stable to PACU.   INDICATIONS: This is a 62 year old male who had sustained a prior right distal radius fracture and had gone on to radiographic and clinical union with residual significant ulnar positivity.  There was mild dorsal angulation of the distal radius fracture after union, however his major issue was significant shortening of the radius after healing.  His major issue was ulnar-sided wrist pain with abutment syndrome that was precluding his ability for ulnar deviation and was causing significant pain.  In order to correct the length of the ulna with the distal radius appropriately, patient was indicated for right ulnar shortening osteotomy.  We did discuss the possibility for distal radius osteotomy and length correction, however given that his symptoms were primarily ulnar-sided from significant abutment, decision was made to proceed with ulnar shortening osteotomy.  Risks and benefits of surgery were discussed including the risks of infection, bleeding, scarring, stiffness, nerve injury, vascular injury, tendon injury, need for subsequent operation, , nonunion, malunion.  He voiced understanding  of these risks and elected to proceed.  OPERATIVE COURSE: Patient was seen and identified in the preoperative area and marked appropriately.  Surgical consent had been signed. Preoperative IV antibiotic prophylaxis was given. He was transferred to the operating room and placed in supine position with the Right upper extremity on an arm board.  Sedation was induced by the anesthesiologist. A regional block had been performed by anesthesia in preoperative holding.    Right upper extremity was prepped and draped in normal sterile orthopedic fashion.  A surgical pause was performed between the surgeons, anesthesia, and operating room staff and all were in agreement as to the patient, procedure, and site of procedure.  Tourniquet was placed and padded appropriately.  Tourniquet was inflated 250 mmHg.  A longitudinal incision was designed over the ulnar aspect of the forearm in line with the ulna.  Dissection was performed down, neurovascular structures were carefully protected.  The interval between the FCU and ECU was utilized, sharp incision was performed and fascia was elevated to expose the underlying ulna.  Preoperatively, we determined that patient needed approximately 9 mm of shortening, which was achievable with the [employer] ulnar shortening plate.  Once the ulna was appropriately visualized, proximal ulnar shortening plate was selected and temporally affixed to bone utilizing clamps.  Distal holes x 3 were then filled with nonlocking screws bicortical in nature.  Proximal free fix screws were then placed in the oblong holes x 2.  At this juncture, once the plate had been appropriately secured, the cutting guide was utilized to resect a 9 mm wafer of bone in oblique fashion.  Neurovascular structures were carefully protected during the bone cutting.  Resected  bone was removed atraumatically, Freeflex holes were loosened slightly and the compression clamp was then utilized for reapproximation across  the osteotomy site of the ulna.  Once appropriate position was achieved with a clamp, free fix screws were tightened followed by placement of the most proximal nonlocking screw as well.  Excellent stability was noted of the plate, osteotomy site had been close down appropriately with appropriate reapproximation.  At this juncture, the oblique screw was placed as an interposition screw for further stability.  Biplanar fluoroscopy was utilized to confirm appropriate plate placement.  At the level of the wrist, biplanar fluoroscopy was utilized to confirm appropriate ulnar shortening and correction of ulnar abutment.  DRUJ remained stable in all planes under gentle stress testing.  Passive motion had been improved significantly with both flexion/extension and ulnar deviation of the wrist.  The tourniquet was deflated at 46 minutes.  Fingertips were pink with brisk capillary refill after deflation of tourniquet.  Copious irrigation was performed of the wound.  Wound was then closed atraumatically in layers, 3-0 Vicryl for the fascial layer, 3-0 Monocryl and 4-0 nylon for the skin surface.  Volar and dorsal short arm splint was then applied utilizing plaster which will remain in place until follow-up.  The operative drapes were broken down.  The patient was awoken from anesthesia safely and taken to PACU in stable condition.  I will see him back in the office in  2 weeks  for postoperative followup.    Due to the complexity of this operation, a fellow surgeon participated.  His presence was helpful in protecting critical neurovascular structures and during the critical portions of the procedure.  The fellow surgeon was also critical in minimizing operative time and patient morbidity.   Samuella Cota, MD Electronically signed, 10/24/22

## 2022-10-25 ENCOUNTER — Telehealth: Payer: Self-pay | Admitting: Orthopedic Surgery

## 2022-10-25 NOTE — Telephone Encounter (Signed)
Patient is not having any pain relief. Would like to know what else he could take for pain in between the oxycodone. His cb# [phone]

## 2022-10-26 ENCOUNTER — Encounter (HOSPITAL_BASED_OUTPATIENT_CLINIC_OR_DEPARTMENT_OTHER): Payer: Self-pay | Admitting: Orthopedic Surgery

## 2022-10-26 NOTE — Telephone Encounter (Signed)
IC, advised patient his forms would be completed today, per Darl Pikes with [employer].

## 2022-10-31 ENCOUNTER — Encounter: Payer: Self-pay | Admitting: Internal Medicine

## 2022-11-06 ENCOUNTER — Ambulatory Visit (INDEPENDENT_AMBULATORY_CARE_PROVIDER_SITE_OTHER): Payer: 59 | Admitting: Orthopedic Surgery

## 2022-11-06 ENCOUNTER — Other Ambulatory Visit (INDEPENDENT_AMBULATORY_CARE_PROVIDER_SITE_OTHER): Payer: 59

## 2022-11-06 DIAGNOSIS — M25831 Other specified joint disorders, right wrist: Secondary | ICD-10-CM

## 2022-11-06 NOTE — Progress Notes (Signed)
   Thomas [name] [facility] - 62 y.o. male MRN [id number]  Date of birth: 1960/12/24  Office Visit Note: Visit Date: 11/06/2022 PCP: Assunta Found, MD Referred by: Assunta Found, MD  Subjective:  HPI: Thomas [name] is a 62 y.o. male who presents today for follow up 2 weeks status post right wrist ulnar shortening osteotomy.  He is doing well overall, pain is well-controlled with over-the-counter medication.  Pertinent ROS were reviewed with the patient and found to be negative unless otherwise specified above in HPI.   Assessment & Plan: Visit Diagnoses:  1. Ulnar impaction syndrome, right     Plan: He is doing well postoperatively.  X-rays today show stable appearance of the ulnar shortening osteotomy with appropriate ulnar variance.  Sutures removed today, short arm cast applied.  Will see him back in 2 weeks for repeat clinical and radiographic check.  At that juncture, I will have occupational therapy fabricate a removable forearm/wrist orthosis.  Follow-up: Return in about 2 weeks (around 11/20/2022).   Meds & Orders: No orders of the defined types were placed in this encounter.   Orders Placed This Encounter  Procedures   XR Wrist Complete Right     Procedures: No procedures performed       Objective:   Vital Signs: There were no vitals taken for this visit.  Ortho Exam Right wrist/forearm: - Well-healing incision, sutures in place, skin edges well-approximated, no erythema or drainage - Gentle range of motion of the wrist without significant pain or crepitus, able to tolerate gentle ulnar deviation of the wrist - Sensation intact distally, hand is warm well-perfused  Imaging: XR Wrist Complete Right  Result Date: 11/06/2022 X-rays of the right wrist, multiple views were obtained today X-rays demonstrate stable appearance of the ulnar shortening osteotomy, hardware is well secured.  Ulnar variance is neutral.  There is appropriate early healing.    Thomas [name] Thomas [name], M.D. [facility] 1:24 PM

## 2022-11-17 NOTE — Therapy (Signed)
OUTPATIENT OCCUPATIONAL THERAPY ORTHO EVALUATION  Patient Name: Thomas [name] MRN: [id number] DOB:1960/07/20, 62 y.o., male Today's Date: 11/20/2022  PCP: Assunta Found, MD REFERRING PROVIDER:  Samuella Cota, MD    END OF SESSION:  OT End of Session - 11/20/22 0936     Visit Number 1    Number of Visits 7    Date for OT Re-Evaluation 01/05/23    Authorization Type [employer]    OT Start Time [phone]    OT Stop Time 1040    OT Time Calculation (min) 64 min    Equipment Utilized During Treatment orthotic materials    Activity Tolerance Patient tolerated treatment well;No increased pain;Patient limited by fatigue;Patient limited by pain    Behavior During Therapy [facility] for tasks assessed/performed             Past Medical History:  Diagnosis Date   GERD (gastroesophageal reflux disease)    History of kidney stones    Hypertension    Thrombocytopenia (HCC) 04/12/2016   Past Surgical History:  Procedure Laterality Date   renalectomy Left    WRIST OSTEOTOMY Right 10/24/2022   Procedure: RIGHT WRIST ULNAR SHORTENING OSTEOTOMY;  Surgeon: Samuella Cota, MD;  Location: [facility];  Service: Orthopedics;  Laterality: Right;  regional block   Patient Active Problem List   Diagnosis Date Noted   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   Thrombocytopenia (HCC) 04/12/2016    ONSET DATE: DOS 10/24/22  REFERRING DIAG: F09.323 (ICD-10-CM) - Ulnar impaction syndrome, right   THERAPY DIAG:  Localized edema  Other lack of coordination  Muscle weakness (generalized)  Stiffness of right wrist, not elsewhere classified  Stiffness of right hand, not elsewhere classified  Pain in right wrist  Rationale for Evaluation and Treatment: Rehabilitation  SUBJECTIVE:   SUBJECTIVE STATEMENT: He is ~4 weeks s/p ulna shortening osteotomy. He states his initial break was years ago, and he has had pain on the ulnar side of the wrist ever since due to  ulnar variance being positive.  He states not having much pain now postop and just had his cast cut off today.  He has Steri-Strips on.  He does state some pain when moving in "certain ways" that is somewhat shocking and almost nervelike.  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   PERTINENT HISTORY: Per MD notes: " splinting appointment, initiate hand therapy for ulnar shortening"   PRECAUTIONS: None  RED FLAGS: None   WEIGHT BEARING RESTRICTIONS: Yes no strength or significant weightbearing until approximately week 8 postop  PAIN:  Are you having pain? Yes: NPRS scale: No pain at rest but at worst up to 8/10 Pain location: Right ulnar forearm and surgical area Pain description: Sore, sometimes sharp with pronation Aggravating factors: Weightbearing attempts Relieving factors: Rest  FALLS: Has patient fallen in last 6 months? No  LIVING ENVIRONMENT: Lives with: lives with their family  Has following equipment at home: None  PLOF: Independent  PATIENT GOALS: To get full use of right dominant hand and arm back and return to work as a Copy  NEXT MD VISIT: Oct 21st approx     OBJECTIVE: (All objective assessments below are from initial evaluation on: 11/20/22 unless otherwise specified.)    HAND DOMINANCE: Right   ADLs: Overall ADLs: States decreased ability to grab, hold household objects, pain and difficulty to open containers, perform FMS  tasks (manipulate fasteners on clothing), mild to moderate bathing problems as well.    FUNCTIONAL OUTCOME MEASURES: Eval: Quick DASH 61% impairment today  (Higher % Score  =  More Impairment)     UPPER EXTREMITY ROM     Shoulder to Wrist AROM Right eval  Forearm supination 76  Forearm pronation  60  Wrist flexion 28  Wrist extension 66  Wrist ulnar deviation 20  Wrist radial deviation 15  Functional dart thrower's motion (F-DTM) in ulnar flexion 35 (60 in  Lt wrist)   F-DTM in radial extension  65  (Blank rows = not tested)   Hand AROM Right eval  Full Fist Ability (or Gap to Distal Palmar Crease) Full fist  Thumb Opposition  (Kapandji Scale)  10/10  (Blank rows = not tested)   UPPER EXTREMITY MMT:    Eval:  NT at eval due to recent and still healing injuries. Will be tested when appropriate.   MMT Right TBD  Shoulder flexion   Shoulder abduction   Shoulder adduction   Shoulder extension   Shoulder internal rotation   Shoulder external rotation   Middle trapezius   Lower trapezius   Elbow flexion   Elbow extension   Forearm supination   Forearm pronation   Wrist flexion   Wrist extension   Wrist ulnar deviation   Wrist radial deviation   (Blank rows = not tested)  HAND FUNCTION: Eval: Observed weakness in affected Rt hand. Details TBD when safe  Grip strength Right: TBD lbs, Left: TBD lbs   COORDINATION: Eval: Observed coordination impairments with affected Rt hand. Details TBD 9 Hole Peg Test Right: TBDsec  (approx 23 sec is WFL)   SENSATION: Eval:  Light touch intact today, though diminished around sx area    EDEMA:   Eval:  Mildly swollen in Rt hand and wrist today  COGNITION: Eval: Overall cognitive status: WFL for evaluation today   OBSERVATIONS:   Eval: Steri-Strips still in place, some mild swelling and deformity ulnarly, but motion especially in ulnar deviation looks pretty good and is not significantly painful.   TODAY'S TREATMENT:  Post-evaluation treatment:   OT starts with self-care education for not soaking his wound, not removing Steri-Strips until they come off naturally, watch for signs of infection and keep wound clean and dry.  He can shower and dry off with a towel but no soaking.  Additionally he should be doing scar mobilizations and touching around his surgical area 4-6 times a day for 3 to 5 minutes at a time to prevent scar adhesion and to help nerves heal.  OT also gives the following  home exercise program (he will remove his orthosis for these) listed below for mainly active range of motion with overpressure and starting light stretches in 3 to 5 days as long as he tolerates it well.  These were all reviewed with him and demonstrated for him and he performs some of them back.  He states understanding all directions today and leaves with no pain.   Exercises - Reach arms upward   - 4 x daily - 10 reps - Turn Palm Facing Up & Down  - 4-6 x daily - 10-15 reps - Forearm Supination Stretch  - 3-4 x daily - 3-5 reps - 15 sec hold - Forearm Pronation Stretch  - 3-4 x daily - 3-5 reps - 15 sec hold - Bend and Pull Back Wrist SLOWLY  - 4 x daily - 10-15 reps - "Windshield Wipers"   -  4 x daily - 10-15 reps - Wrist AROM Dart Throwers Motion  - 4 x daily - 10-15 reps - Wrist Flexion Stretch  - 4 x daily - 3-5 reps - 15 sec hold - Wrist Prayer Stretch  - 4 x daily - 3-5 reps - 15 sec hold - Hammer Stretch or Strength   - 2-4 x daily - 1-2 sets - 10-15 reps - Tendon Glides  - 4-6 x daily - 3-5 reps - 2-3 seconds hold - Finger Spreading  - 4-6 x daily - 10-15 reps - Thumb Opposition  - 4-6 x daily - 10 reps   -skin and scar mobilizations 4-6, 3-5 mins   Custom orthotic fabrication was indicated due to pt's healing right ulna shortening surgery and need for safe, functional positioning during healing process. OT fabricated custom right arm circumferential wrist immobilization orthosis for pt today to immobilize right wrist and allow healing. It fit well with no areas of pressure, pt states a comfortable fit. Pt was educated on the wearing schedule (on at all times except for hygiene and exercises), and that he can begin weaning of his orthosis as tolerated for light or no weight activities for 15 to 30 minutes in the day 2-3 times a day.  He should avoid exposing it to sources of heat, to wipe clean as needed (do not wash, use harsh detergents), to call or come in ASAP if it is causing any  irritation or is not achieving desired function. It will be checked/adjusted in upcoming sessions, as needed. Pt states understanding all directions.     PATIENT EDUCATION: Education details: See tx section above for details  Person educated: Patient Education method: Verbal Instruction, Teach back, Handouts  Education comprehension: States and demonstrates understanding, Additional Education required    HOME EXERCISE PROGRAM: Access Code: [email or web address] URL: [email or web address] Date: 11/20/2022 Prepared by: Fannie Knee   GOALS: Goals reviewed with patient? Yes   SHORT TERM GOALS: (STG required if POC>30 days) Target Date: 12/01/22  Pt will obtain protective, custom orthotic. Goal status: MET 2.  Pt will demo/state understanding of initial HEP to improve pain levels and prerequisite motion. Goal status: INITIAL   LONG TERM GOALS: Target Date: 01/05/23  Pt will improve functional ability by decreased impairment per Quick DASH assessment from 61% to 15% or better, for better quality of life. Goal status: INITIAL  2.  Pt will improve grip strength in Rt hand to at least 45 lbs for functional use at home and in IADLs. Goal status: INITIAL  3.  Pt will improve A/ROM in Rt wrist flex/ext from 28* / 66* to at least 60* / 73*, to have functional motion for tasks like reach and grasp.  Goal status: INITIAL  4.  Pt will improve strength in Rt forearm pro/sup from 3-/5 MMT to at least 4+/5 MMT to have increased functional ability to carry out selfcare and higher-level homecare tasks with less difficulty. Goal status: INITIAL  5.  Pt will improve coordination skills in Rt hand, as seen by [facility] score on 9HPT testing to have increased functional ability to carry out fine motor tasks (fasteners, etc.) and more complex, coordinated IADLs (meal prep, sports, etc.).  Goal status: INITIAL  6.  Pt will decrease pain at worst from 8/10 to 2/10 or better to have better sleep and  occupational participation in daily roles. Goal status: INITIAL  ASSESSMENT:  CLINICAL IMPRESSION: Patient is a 62 y.o. male who was seen today for occupational therapy evaluation  for pain, stiffness, weakness in dominant right arm following ulnar shortening ostomy procedure after years of pain and deformity.  These things limit his daily ability, work tasks, etc.  He will benefit from outpatient occupational therapy to decrease negative symptoms and increase quality of life.   PERFORMANCE DEFICITS: in functional skills including ADLs, IADLs, coordination, sensation, edema, ROM, strength, pain, fascial restrictions, muscle spasms, flexibility, Gross motor control, body mechanics, endurance, and UE functional use, cognitive skills including problem solving and safety awareness, and psychosocial skills including coping strategies and environmental adaptation.   IMPAIRMENTS: are limiting patient from ADLs, IADLs, leisure, and social participation.   COMORBIDITIES: may have co-morbidities  that affects occupational performance. Patient will benefit from skilled OT to address above impairments and improve overall function.  MODIFICATION OR ASSISTANCE TO COMPLETE EVALUATION: No modification of tasks or assist necessary to complete an evaluation.  OT OCCUPATIONAL PROFILE AND HISTORY: Problem focused assessment: Including review of records relating to presenting problem.  CLINICAL DECISION MAKING: LOW - limited treatment options, no task modification necessary  REHAB POTENTIAL: Excellent  EVALUATION COMPLEXITY: Low      PLAN:  OT FREQUENCY: 1-2x/week  OT DURATION: 6 weeks  PLANNED INTERVENTIONS: self care/ADL training, therapeutic exercise, therapeutic activity, neuromuscular re-education, manual therapy, scar mobilization, passive range of motion, splinting, electrical stimulation, ultrasound, fluidotherapy, compression bandaging, moist heat, cryotherapy, contrast bath, patient/family  education, coping strategies training, Re-evaluation, and Dry needling  RECOMMENDED OTHER SERVICES: none now   CONSULTED AND AGREED WITH PLAN OF CARE: Patient  PLAN FOR NEXT SESSION: Check orthosis, check HEP and initial recommendations, add manual therapy, start strength (>3#) near ~8 week mark    Fannie Knee, OTR/L, CHT 11/20/2022, 10:54 AM

## 2022-11-20 ENCOUNTER — Ambulatory Visit (INDEPENDENT_AMBULATORY_CARE_PROVIDER_SITE_OTHER): Payer: 59

## 2022-11-20 ENCOUNTER — Encounter: Payer: Self-pay | Admitting: Rehabilitative and Restorative Service Providers"

## 2022-11-20 ENCOUNTER — Other Ambulatory Visit: Payer: Self-pay

## 2022-11-20 ENCOUNTER — Ambulatory Visit (INDEPENDENT_AMBULATORY_CARE_PROVIDER_SITE_OTHER): Payer: 59 | Admitting: Rehabilitative and Restorative Service Providers"

## 2022-11-20 ENCOUNTER — Ambulatory Visit (INDEPENDENT_AMBULATORY_CARE_PROVIDER_SITE_OTHER): Payer: 59 | Admitting: Orthopedic Surgery

## 2022-11-20 DIAGNOSIS — M25641 Stiffness of right hand, not elsewhere classified: Secondary | ICD-10-CM

## 2022-11-20 DIAGNOSIS — M25631 Stiffness of right wrist, not elsewhere classified: Secondary | ICD-10-CM

## 2022-11-20 DIAGNOSIS — R600 Localized edema: Secondary | ICD-10-CM | POA: Diagnosis not present

## 2022-11-20 DIAGNOSIS — M25831 Other specified joint disorders, right wrist: Secondary | ICD-10-CM

## 2022-11-20 DIAGNOSIS — R278 Other lack of coordination: Secondary | ICD-10-CM | POA: Diagnosis not present

## 2022-11-20 DIAGNOSIS — M6281 Muscle weakness (generalized): Secondary | ICD-10-CM | POA: Diagnosis not present

## 2022-11-20 DIAGNOSIS — M25531 Pain in right wrist: Secondary | ICD-10-CM

## 2022-11-20 NOTE — Progress Notes (Signed)
[name] [facility] - 62 y.o. male MRN [id number]  Date of birth: 1960/08/02  Office Visit Note: Visit Date: 11/20/2022 PCP: Assunta Found, MD Referred by: Assunta Found, MD  Subjective:  HPI: Thomas [name] is a 62 y.o. male who presents today for follow up 4 weeks status post right wrist ulnar shortening osteotomy.  He is doing well overall, pain is controlled.  Pertinent ROS were reviewed with the patient and found to be negative unless otherwise specified above in HPI.   Assessment & Plan: Visit Diagnoses:  1. Ulnar impaction syndrome, right     Plan: X-rays today demonstrate stable appearance of the ulnar shortening osteotomy with interval healing in the form of callus formation.  Occupational therapy will fabricate an orthosis today for the wrist and forearm.  He can begin range of motion exercises of the wrist and hand.  Will wait until the week 8 mark to begin strengthening.  I will plan on seeing him back in 4 weeks to track his progress.  Follow-up: Return in about 4 weeks (around 12/18/2022).   Meds & Orders: No orders of the defined types were placed in this encounter.   Orders Placed This Encounter  Procedures   XR Wrist Complete Right     Procedures: No procedures performed       Objective:   Vital Signs: There were no vitals taken for this visit.  Ortho Exam Right upper extremity: - Wrist range of motion, flexion/extension 55/45 - Ulnar deviation 20 degrees without pain at wrist - Composite fist without restriction - Sensation intact distally - Well-healing incision - Minimally tender over the ulnar shaft osteotomy site  Imaging: XR Wrist Complete Right  Result Date: 11/20/2022 X-rays of the right wrist, multiview's were obtained today X-rays demonstrate stable appearance of the ulnar shortening osteotomy plate, hardware is well secured.  There is callus formation seen on multiple views at the osteotomy site.  Notable ulnar neutral variance.     Aamir Mclinden [name], M.D. [facility] 9:34 AM

## 2022-11-23 ENCOUNTER — Ambulatory Visit: Payer: 59 | Admitting: Internal Medicine

## 2022-12-04 ENCOUNTER — Encounter: Payer: 59 | Admitting: Rehabilitative and Restorative Service Providers"

## 2022-12-08 ENCOUNTER — Telehealth: Payer: Self-pay | Admitting: Orthopedic Surgery

## 2022-12-08 NOTE — Telephone Encounter (Signed)
Patient called regarding a few questions. Did he need to schedule a follow up? When will he be released to go back to work? For PT can he be seen in [place] area? Patient advised to give a call back please.

## 2022-12-11 ENCOUNTER — Telehealth: Payer: Self-pay | Admitting: Surgery

## 2022-12-11 NOTE — Telephone Encounter (Signed)
e

## 2022-12-12 ENCOUNTER — Ambulatory Visit (INDEPENDENT_AMBULATORY_CARE_PROVIDER_SITE_OTHER): Payer: 59 | Admitting: Orthopedic Surgery

## 2022-12-12 ENCOUNTER — Ambulatory Visit (INDEPENDENT_AMBULATORY_CARE_PROVIDER_SITE_OTHER): Payer: 59

## 2022-12-12 ENCOUNTER — Telehealth: Payer: Self-pay | Admitting: Orthopedic Surgery

## 2022-12-12 DIAGNOSIS — M25831 Other specified joint disorders, right wrist: Secondary | ICD-10-CM

## 2022-12-12 NOTE — Progress Notes (Signed)
   Thomas [name] [facility] - 62 y.o. male MRN [id number]  Date of birth: 05/04/1960  Office Visit Note: Visit Date: 12/12/2022 PCP: Assunta Found, MD Referred by: Assunta Found, MD  Subjective:  HPI: Thomas [name] is a 62 y.o. male who presents today for follow up 7 weeks status post right wrist ulnar shortening osteotomy.  He is doing well overall, continues to improve with range of motion activities at the wrist and forearm.  Has not yet begun strengthening exercises.  Pertinent ROS were reviewed with the patient and found to be negative unless otherwise specified above in HPI.   Assessment & Plan: Visit Diagnoses:  1. Ulnar impaction syndrome, right     Plan: At this juncture, he is stable to begin strengthening exercises of the wrist and forearm.  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  We will reevaluate him in 6 to 8 weeks.  I hope is at that point he has regained strength and we can begin to advance his activities.  Follow-up: No follow-ups on file.   Meds & Orders: No orders of the defined types were placed in this encounter.   Orders Placed This Encounter  Procedures   XR Wrist Complete Right     Procedures: No procedures performed       Objective:   Vital Signs: There were no vitals taken for this visit.  Ortho Exam Ortho Exam Right upper extremity: - Wrist range of motion, flexion/extension 55/45 - Ulnar deviation 20 degrees without pain at wrist - Composite fist without restriction - Sensation intact distally - Well-healed incision - Minimally tender over the ulnar shaft osteotomy site  Imaging: X-rays of the wrist, multiple views demonstrate ulnar shortening plate.  There is mild lucency at the midshaft at the osteotomy site, screws above below the osteotomy site are stable.  Ulnar neutral variance is appreciated.   Thomas [name] Thomas [name], M.D. [facility] 5:46 PM

## 2022-12-12 NOTE — Telephone Encounter (Signed)
Pt request a reminder to [facility] to extend end date to his next appt date.

## 2022-12-13 ENCOUNTER — Other Ambulatory Visit: Payer: Self-pay | Admitting: Orthopedic Surgery

## 2022-12-13 DIAGNOSIS — M25831 Other specified joint disorders, right wrist: Secondary | ICD-10-CM

## 2022-12-13 NOTE — Telephone Encounter (Signed)
Pt called requesting to move his physical therapy to [facility] fax number [phone]. Pt states this facility closer to his home. Pt phone number is [phone]

## 2022-12-13 NOTE — Telephone Encounter (Signed)
This referral has been sent

## 2022-12-14 ENCOUNTER — Telehealth: Payer: Self-pay | Admitting: Orthopedic Surgery

## 2022-12-14 NOTE — Telephone Encounter (Signed)
Principal forms received. To [employer].

## 2022-12-19 ENCOUNTER — Telehealth: Payer: Self-pay | Admitting: Orthopedic Surgery

## 2022-12-19 NOTE — Telephone Encounter (Signed)
Patient called says something is wrong. He is hurting. His arm is swollen. Can not move his arm in a certain way. His cb# is [phone]

## 2022-12-20 NOTE — Telephone Encounter (Signed)
Patient is coming Friday

## 2022-12-22 ENCOUNTER — Encounter: Payer: 59 | Admitting: Orthopedic Surgery

## 2022-12-27 ENCOUNTER — Ambulatory Visit (HOSPITAL_COMMUNITY): Payer: 59 | Admitting: Occupational Therapy

## 2023-01-08 ENCOUNTER — Other Ambulatory Visit: Payer: Self-pay

## 2023-01-08 ENCOUNTER — Emergency Department (HOSPITAL_COMMUNITY)
Admission: EM | Admit: 2023-01-08 | Discharge: 2023-01-08 | Disposition: A | Discharge status: Home / Self Care | Payer: 59 | Attending: Emergency Medicine | Admitting: Emergency Medicine

## 2023-01-08 DIAGNOSIS — K644 Residual hemorrhoidal skin tags: Secondary | ICD-10-CM | POA: Diagnosis not present

## 2023-01-08 DIAGNOSIS — R55 Syncope and collapse: Secondary | ICD-10-CM | POA: Insufficient documentation

## 2023-01-08 DIAGNOSIS — N3001 Acute cystitis with hematuria: Secondary | ICD-10-CM | POA: Insufficient documentation

## 2023-01-08 DIAGNOSIS — I10 Essential (primary) hypertension: Secondary | ICD-10-CM | POA: Diagnosis not present

## 2023-01-08 DIAGNOSIS — Z7982 Long term (current) use of aspirin: Secondary | ICD-10-CM | POA: Insufficient documentation

## 2023-01-08 DIAGNOSIS — M62830 Muscle spasm of back: Secondary | ICD-10-CM | POA: Diagnosis not present

## 2023-01-08 DIAGNOSIS — M546 Pain in thoracic spine: Secondary | ICD-10-CM | POA: Diagnosis present

## 2023-01-08 DIAGNOSIS — Z79899 Other long term (current) drug therapy: Secondary | ICD-10-CM | POA: Insufficient documentation

## 2023-01-08 LAB — CBC
HCT: 28.2 % — ABNORMAL LOW (ref 39.0–52.0)
Hemoglobin: 9.1 g/dL — ABNORMAL LOW (ref 13.0–17.0)
MCH: 26.9 pg (ref 26.0–34.0)
MCHC: 32.3 g/dL (ref 30.0–36.0)
MCV: 83.4 fL (ref 80.0–100.0)
Platelets: DECREASED 10*3/uL (ref 150–400)
RBC: 3.38 MIL/uL — ABNORMAL LOW (ref 4.22–5.81)
RDW: 18.7 % — ABNORMAL HIGH (ref 11.5–15.5)
WBC: 10.6 10*3/uL — ABNORMAL HIGH (ref 4.0–10.5)
nRBC: 0 % (ref 0.0–0.2)

## 2023-01-08 LAB — BASIC METABOLIC PANEL
Anion gap: 6 (ref 5–15)
BUN: 11 mg/dL (ref 8–23)
CO2: 21 mmol/L — ABNORMAL LOW (ref 22–32)
Calcium: 7.7 mg/dL — ABNORMAL LOW (ref 8.9–10.3)
Chloride: 103 mmol/L (ref 98–111)
Creatinine, Ser: 0.9 mg/dL (ref 0.61–1.24)
GFR, Estimated: >60 mL/min (ref >60)
Glucose, Bld: 117 mg/dL — ABNORMAL HIGH (ref 70–99)
Potassium: 3.1 mmol/L — ABNORMAL LOW (ref 3.5–5.1)
Sodium: 130 mmol/L — ABNORMAL LOW (ref 135–145)

## 2023-01-08 LAB — URINALYSIS, ROUTINE W REFLEX MICROSCOPIC
Bilirubin Urine: NEGATIVE
Glucose, UA: NEGATIVE mg/dL
Ketones, ur: NEGATIVE mg/dL
Nitrite: NEGATIVE
Protein, ur: NEGATIVE mg/dL
Specific Gravity, Urine: 1.015 (ref 1.005–1.030)
pH: 5 (ref 5.0–8.0)

## 2023-01-08 LAB — POC OCCULT BLOOD, ED: Fecal Occult Bld: NEGATIVE

## 2023-01-08 MED ORDER — POTASSIUM CHLORIDE CRYS ER 20 MEQ PO TBCR
40.0000 meq | EXTENDED_RELEASE_TABLET | Freq: Once | ORAL | Status: AC
  Administered 2023-01-08: 40 meq via ORAL
  Filled 2023-01-08: qty 2

## 2023-01-08 MED ORDER — CEPHALEXIN 500 MG PO CAPS
500.0000 mg | ORAL_CAPSULE | Freq: Once | ORAL | Status: AC
  Administered 2023-01-08: 500 mg via ORAL
  Filled 2023-01-08: qty 1

## 2023-01-08 MED ORDER — DEXAMETHASONE SODIUM PHOSPHATE 10 MG/ML IJ SOLN
10.0000 mg | Freq: Once | INTRAMUSCULAR | Status: AC
  Administered 2023-01-08: 10 mg via INTRAMUSCULAR
  Filled 2023-01-08: qty 1

## 2023-01-08 MED ORDER — LIDOCAINE 5 % EX PTCH
1.0000 | MEDICATED_PATCH | CUTANEOUS | Status: DC
  Administered 2023-01-08: 1 via TRANSDERMAL
  Filled 2023-01-08: qty 1

## 2023-01-08 MED ORDER — CEPHALEXIN 500 MG PO CAPS: 500.0000 mg | ORAL_CAPSULE | Freq: Four times a day (QID) | ORAL | 0 refills | Status: AC

## 2023-01-08 MED ORDER — KETOROLAC TROMETHAMINE 60 MG/2ML IM SOLN
30.0000 mg | Freq: Once | INTRAMUSCULAR | Status: AC
  Administered 2023-01-08: 30 mg via INTRAMUSCULAR
  Filled 2023-01-08: qty 2

## 2023-01-08 MED ORDER — CEPHALEXIN 500 MG PO CAPS: 500.0000 mg | ORAL_CAPSULE | Freq: Four times a day (QID) | ORAL | 0 refills | Status: DC

## 2023-01-08 MED ORDER — LIDOCAINE 5 % EX PTCH: 1.0000 | MEDICATED_PATCH | CUTANEOUS | 0 refills | Status: DC

## 2023-01-08 NOTE — ED Triage Notes (Signed)
Feels like a big knot in his upper back and it will not go away. Reports he had an episode of passing out today when trying to stand up today.

## 2023-01-08 NOTE — ED Notes (Signed)
POC occult blood negative--PA-C made aware

## 2023-01-08 NOTE — Discharge Instructions (Addendum)
As discussed, stop using BC powder. You can use lidocaine patches, Voltaren cream, heat, and ice to help with you pain.  Your urine showed signs of infection so we will start you on Keflex.  Take this 4 times a day for the next 7 days.  Please take full course of antibiotics. Urine is also being sent for culture.  You will receive a call from the hospital if you need to be switched to a different antibiotic.  You do not hear from [place], please continue the antibiotic.  Follow-up with your PCP in the next 7 days for reevaluation of your symptoms.  Return to the ED if your symptoms worsen in the interim.

## 2023-01-08 NOTE — ED Provider Notes (Signed)
[place] EMERGENCY DEPARTMENT AT [facility] Provider Note   CSN: [id number] Arrival date & time: 01/08/23  1317     History {Add pertinent medical, surgical, social history, OB history to HPI:1} Chief Complaint  Patient presents with   Back Pain    Thomas [name] is a 62 y.o. male with a history of thrombocytopenia, hypertension, and left nephrectomy who presents the ED today for back pain.  Patient reports that he woke up 2 days ago with right upper back pain that has been persistent since.  He states he feels like his back muscles are "squeezing."  Patient denies any injury or trauma prior to the onset of symptoms.  He has been taking BC powder and Tylenol without any relief of symptoms.  Denies any weakness, loss of sensation, or strength.   Patient states he is getting about the couch today when he had shooting passed out.  He states that he hit his head on the ground and says for about 2 minutes.  He denies any blood thinner use.  No slurred speech, vision changes, or focal neurodeficits.  Denies any additional complaints or concerns at this time.    Home Medications Prior to Admission medications   Medication Sig Start Date End Date Taking? Authorizing Provider  SUMAtriptan (IMITREX) 50 MG tablet Take 50 mg by mouth daily. 10/04/22  Yes [provider]  tadalafil (CIALIS) 20 MG tablet Take 20 mg by mouth as needed. 10/10/22  Yes [provider]  ALPRAZolam Prudy Feeler) 1 MG tablet Take 1 mg by mouth 2 (two) times daily as needed for anxiety. 12/05/22   [provider]  Aspirin-Salicylamide-Caffeine (BC HEADACHE) 325-95-16 MG TABS Take 1 packet by mouth daily as needed (for pain).    [provider]  DULoxetine (CYMBALTA) 60 MG capsule Take 60 mg by mouth daily.    [provider]  hydrOXYzine (ATARAX/VISTARIL) 25 MG tablet Take one at bedtime as needed for sleep 02/29/16   Bethann Berkshire, MD  ibuprofen (ADVIL) 800 MG tablet Take 1  tablet (800 mg total) by mouth 3 (three) times daily. Take with food 05/09/19   Avegno, Zachery Dakins, FNP  olmesartan (BENICAR) 40 MG tablet Take 40 mg by mouth daily.    [provider]  oxyCODONE (ROXICODONE) 5 MG immediate release tablet Take 1 tablet (5 mg total) by mouth every 6 (six) hours as needed for severe pain. Patient not taking: Reported on 11/20/2022 10/24/22   Samuella Cota, MD      Allergies    Patient has no known allergies.    Review of Systems   Review of Systems  Musculoskeletal:  Positive for back pain.  All other systems reviewed and are negative.   Physical Exam Updated Vital Signs BP 117/66 (BP Location: Left Arm)   Pulse 89   Temp 99.2 F (37.3 C) (Oral)   Resp 16   Ht 5\' 11"  (1.803 m)   Wt 81.6 kg   SpO2 98%   BMI 25.10 kg/m  Physical Exam Vitals and nursing note reviewed. Exam conducted with a chaperone present.  Constitutional:      Appearance: Normal appearance.  HENT:     Head: Normocephalic and atraumatic.     Mouth/Throat:     Mouth: Mucous membranes are moist.  Eyes:     Conjunctiva/sclera: Conjunctivae normal.     Pupils: Pupils are equal, round, and reactive to light.  Cardiovascular:     Rate and Rhythm: Normal rate and  regular rhythm.     Pulses: Normal pulses.  Pulmonary:     Effort: Pulmonary effort is normal.     Breath sounds: Normal breath sounds.  Abdominal:     Palpations: Abdomen is soft.     Tenderness: There is no abdominal tenderness.  Genitourinary:    Rectum: Guaiac result negative.     Comments: Palpable, external hemorrhoids present Musculoskeletal:        General: Tenderness present.     Comments: Tenderness and swelling to palpation of right trapezius muscle  Skin:    General: Skin is warm and dry.     Findings: No rash.  Neurological:     General: No focal deficit present.     Mental Status: He is alert.     Sensory: No sensory deficit.     Motor: No weakness.  Psychiatric:        Mood and  Affect: Mood normal.        Behavior: Behavior normal.    ED Results / Procedures / Treatments   Labs (all labs ordered are listed, but only abnormal results are displayed) Labs Reviewed  CBC  BASIC METABOLIC PANEL    EKG None  Radiology No results found.  Procedures Procedures: not indicated. {Document cardiac monitor, telemetry assessment procedure when appropriate:1}  Medications Ordered in ED Medications - No data to display  ED Course/ Medical Decision Making/ A&P   {   Click here for ABCD2, HEART and other calculatorsREFRESH Note before signing :1}                              Medical Decision Making Amount and/or Complexity of Data Reviewed Labs: ordered.  Risk Prescription drug management.   This patient presents to the ED for concern of back pain, this involves an extensive number of treatment options, and is a complaint that carries with it a high risk of complications and morbidity.   Differential diagnosis includes: ***   Comorbidities  See HPI above   Additional History  Additional history obtained from orthopedic notes.   Cardiac Monitoring / EKG  The patient was maintained on a cardiac monitor.  I personally viewed and interpreted the cardiac monitored which showed: sinus rhythm with a borderline prolonged QT, heart rate of 83 bpm.   Lab Tests  I ordered and personally interpreted labs.  The pertinent results include:      Imaging Studies  Per [place] CT score, head CT is not indicated at this time.   Problem List / ED Course / Critical Interventions / Medication Management  Right upper back pain I ordered medications including: *** for ***  Reevaluation of the patient after these medicines showed that the patient {resolved/improved/worsened:23923::"improved"} I have reviewed the patients home medicines and have made adjustments as needed   Social Determinants of Health  Physical activity   Test / Admission -  Considered  ***    {Document critical care time when appropriate:1} {Document review of labs and clinical decision tools ie heart score, Chads2Vasc2 etc:1}  {Document your independent review of radiology images, and any outside records:1} {Document your discussion with family members, caretakers, and with consultants:1} {Document social determinants of health affecting pt's care:1} {Document your decision making why or why not admission, treatments were needed:1} Final Clinical Impression(s) / ED Diagnoses Final diagnoses:  None    Rx / DC Orders ED Discharge Orders     None

## 2023-01-09 LAB — URINE CULTURE: Culture: NO GROWTH

## 2023-01-11 ENCOUNTER — Encounter (HOSPITAL_COMMUNITY): Payer: Self-pay | Admitting: Occupational Therapy

## 2023-01-11 ENCOUNTER — Ambulatory Visit: Payer: 59 | Admitting: Neurology

## 2023-01-11 ENCOUNTER — Ambulatory Visit (HOSPITAL_COMMUNITY): Payer: 59 | Attending: Orthopedic Surgery | Admitting: Occupational Therapy

## 2023-01-11 DIAGNOSIS — M25831 Other specified joint disorders, right wrist: Secondary | ICD-10-CM | POA: Insufficient documentation

## 2023-01-11 DIAGNOSIS — M25631 Stiffness of right wrist, not elsewhere classified: Secondary | ICD-10-CM | POA: Diagnosis present

## 2023-01-11 DIAGNOSIS — R29898 Other symptoms and signs involving the musculoskeletal system: Secondary | ICD-10-CM | POA: Insufficient documentation

## 2023-01-11 NOTE — Therapy (Signed)
OUTPATIENT OCCUPATIONAL THERAPY ORTHO EVALUATION  Patient Name: Thomas [name] MRN: [id number] DOB:1960-11-26, 62 y.o., male Today's Date: 01/12/2023  PCP: Assunta Found, MD REFERRING PROVIDER: Samuella Cota, MD - Cyndia Skeeters  END OF SESSION:  OT End of Session - 01/12/23 1840     Visit Number 1    Number of Visits 1    Date for OT Re-Evaluation 01/12/23    Authorization Type [facility]    OT Start Time 1524    OT Stop Time 1549    OT Time Calculation (min) 25 min    Activity Tolerance Patient tolerated treatment well    Behavior During Therapy [facility] for tasks assessed/performed             Past Medical History:  Diagnosis Date   GERD (gastroesophageal reflux disease)    History of kidney stones    Hypertension    Thrombocytopenia (HCC) 04/12/2016   Past Surgical History:  Procedure Laterality Date   renalectomy Left    WRIST OSTEOTOMY Right 10/24/2022   Procedure: RIGHT WRIST ULNAR SHORTENING OSTEOTOMY;  Surgeon: Samuella Cota, MD;  Location:  SURGERY CENTER;  Service: Orthopedics;  Laterality: Right;  regional block   Patient Active Problem List   Diagnosis Date Noted   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 He states his initial break was years ago, and he has had pain on the ulnar side of the wrist ever since due to ulnar variance being positive.   PRECAUTIONS: None  WEIGHT BEARING RESTRICTIONS: No  PAIN:  Are you having pain? No  FALLS: Has patient fallen in last 6 months?  No  LIVING ENVIRONMENT: Lives with: lives with their spouse Lives in: House/apartment  PLOF: Independent  PATIENT GOALS: "To get back to work"  NEXT MD VISIT: 02/04/23  OBJECTIVE:  Note: Objective measures were completed at Evaluation unless otherwise noted.  HAND DOMINANCE: Right  ADLs: Overall ADLs: No overt concerns  FUNCTIONAL OUTCOME MEASURES: Upper Extremity Functional Scale (UEFS): 0%  UPPER EXTREMITY ROM:     Active ROM Right eval  Wrist flexion 50  Wrist extension 79  Wrist ulnar deviation 36  Wrist radial deviation 45  Wrist pronation 90  Wrist supination 90  (Blank rows = not tested)  UPPER EXTREMITY MMT:     MMT Right eval  Wrist flexion 5/5  Wrist extension 5/5  Wrist ulnar deviation 5/5  Wrist radial deviation 5/5  Wrist pronation 5/5  Wrist supination 5/5  (Blank rows = not tested)  HAND FUNCTION: Grip strength: Right: 58 lbs; Left: 67 lbs, Lateral pinch: Right: 16 lbs, Left: 15 lbs, and 3 point pinch: Right: 15 lbs, Left: 11 lbs  SENSATION: WFL  EDEMA: No Swelling Noted  OBSERVATIONS: No fascial restrictions noted   TODAY'S TREATMENT:  DATE: 01/11/23: Evaluation Only    PATIENT EDUCATION: Education details: Wrist ROM and strengthening Person educated: Patient Education method: Explanation, Demonstration, and Handouts Education comprehension: verbalized understanding and returned demonstration  HOME EXERCISE PROGRAM: Wrist ROM and Strengthening   ASSESSMENT:  CLINICAL IMPRESSION: Patient is a 62 y.o. male who was seen today for occupational therapy evaluation for R Ulnar Shortening. Pt presented to OT evaluation with no pain, no ROM concerns and full wrist strength, as well as grip and pinch strength. Pt has no further skilled OT needs and will be discharged from skilled OT.   PERFORMANCE DEFICITS: in  functional skills including UE functional use.  IMPAIRMENTS: are limiting patient from work.   COMORBIDITIES: has no other co-morbidities that affects occupational performance. Patient will benefit from skilled OT to address above impairments and improve overall function.  MODIFICATION OR ASSISTANCE TO COMPLETE EVALUATION: No modification of tasks or assist necessary to complete an evaluation.  OT OCCUPATIONAL PROFILE AND HISTORY: Problem focused assessment: Including review of records relating to presenting problem.  CLINICAL DECISION MAKING: LOW - limited treatment options, no task modification necessary  REHAB POTENTIAL: Excellent  EVALUATION COMPLEXITY: Low      PLAN:  OT FREQUENCY: one time visit  OT DURATION: other: Evaluation Only  PLANNED INTERVENTIONS:  None, Eval only  RECOMMENDED OTHER SERVICES: N/A  CONSULTED AND AGREED WITH PLAN OF CARE: Patient  PLAN FOR NEXT SESSION: Discharge   Lyrah Bradt Bing Plume, OTR/L [facility] [phone] Arnika Larzelere Rosemarie Beath, OT 01/12/2023, 6:42 PM

## 2023-01-13 ENCOUNTER — Telehealth: Payer: Self-pay

## 2023-01-13 NOTE — Telephone Encounter (Signed)
Patient called in to state he was in the ED after surgical  procedure and he is having terrible muscle spasms. Messaged with provider, they state to call ortho or family MD on call. Return precautions discussed with patient.

## 2023-02-05 ENCOUNTER — Other Ambulatory Visit (INDEPENDENT_AMBULATORY_CARE_PROVIDER_SITE_OTHER): Payer: 59

## 2023-02-05 ENCOUNTER — Ambulatory Visit (INDEPENDENT_AMBULATORY_CARE_PROVIDER_SITE_OTHER): Payer: 59 | Admitting: Orthopedic Surgery

## 2023-02-05 ENCOUNTER — Telehealth: Payer: Self-pay | Admitting: Orthopedic Surgery

## 2023-02-05 DIAGNOSIS — M25831 Other specified joint disorders, right wrist: Secondary | ICD-10-CM

## 2023-02-05 NOTE — H&P (View-Only) (Signed)
   [name] [facility] - 62 y.o. male MRN [id number]  Date of birth: [birth year]  Office Visit Note: Visit Date: 02/05/2023 PCP: Assunta Found, MD Referred by: Assunta Found, MD  Subjective:  HPI: Thomas [name] is a 62 y.o. male who presents today for follow up 15 weeks status post right wrist ulnar shortening osteotomy.  He states that he has resumed shooting guns with the right wrist, with the ongoing recoil, he is having ongoing pain which is increasing.  Has also trialed swinging a golf club of the right hand, notable pain with contact with the ground.  Pertinent ROS were reviewed with the patient and found to be negative unless otherwise specified above in HPI.   Assessment & Plan: Visit Diagnoses:  1. Ulnar impaction syndrome, right     Plan: Based on his clinical examination today and correlated with the radiographic evidence, there is evidence of hardware loosening of the ulnar shortening osteotomy plate.  There is evidence of osteolysis as well within the bone.  Labs will be drawn today to rule out any infectious workup for ongoing osteolysis.  At this juncture, giving his ongoing pain and evidence of osteolysis on his x-ray, I have recommended that we remove the ulnar shortening plate at this time.  Intraoperatively, we can get a better look at the ulnar shaft bony architecture to better determine if repeat fixation is required.  The alternative intraoperative judgment would be for transition to a Sauve Kapanji type procedure in which we fuse the distal radioulnar joint and allow forearm rotation through the prior osteotomy site given the ongoing osteolysis.  I explained that this decision will be made intraoperatively.  Risks and benefits of the procedure were discussed, risks including but not limited to infection, bleeding, scarring, stiffness, nerve injury, tendon injury, vascular injury, hardware complication, recurrence of symptoms and need for subsequent operation.  Patient  expressed understanding.    Will move forward with surgical scheduling of right forearm removal of hardware, possible repeat fixation of the ulnar shaft versus potential arthrodesis of the distal radial ulnar joint.  Surgery will be performed later this week.  Labs drawn today, will be reviewed, CBC, ESR, CRP.   Follow-up: No follow-ups on file.   Meds & Orders: No orders of the defined types were placed in this encounter.   Orders Placed This Encounter  Procedures   XR Wrist Complete Right   Sedimentation rate   CBC with Differential/Platelet   C-reactive protein     Procedures: No procedures performed       Objective:   Vital Signs: There were no vitals taken for this visit.  Ortho Exam Right forearm: - Well-healed ulnar incision, skin is well-approximated without erythema - Notable tenderness over the ulnar shaft, there is notable mobility of the bone with deep palpation - Instability is present of the distal radial ulnar joint with stress testing, particularly in pronation - Full digital range of motion, able to perform wrist range of motion flexion/extension, pronation/supination without significant pain  Imaging: XR Wrist Complete Right  Result Date: 02/05/2023 X-rays of the right wrist, multiple views were reviewed today X-rays demonstrate osteolysis particularly at the distal aspect of the plate surrounding the screws within the ulnar shaft, there is also notable bone void seen at the osteotomy site.  Proximal screws remain well-fixed to the bone through the proximal portion of the plate.    Annica Marinello [name], M.D. [facility] 10:17 PM

## 2023-02-05 NOTE — Progress Notes (Signed)
   Thomas [name] [facility] - 62 y.o. male MRN [id number]  Date of birth: [birth year]  Office Visit Note: Visit Date: 02/05/2023 PCP: Assunta Found, MD Referred by: Assunta Found, MD  Subjective:  HPI: Thomas [name] is a 62 y.o. male who presents today for follow up 15 weeks status post right wrist ulnar shortening osteotomy.  He states that he has resumed shooting guns with the right wrist, with the ongoing recoil, he is having ongoing pain which is increasing.  Has also trialed swinging a golf club of the right hand, notable pain with contact with the ground.  Pertinent ROS were reviewed with the patient and found to be negative unless otherwise specified above in HPI.   Assessment & Plan: Visit Diagnoses:  1. Ulnar impaction syndrome, right     Plan: Based on his clinical examination today and correlated with the radiographic evidence, there is evidence of hardware loosening of the ulnar shortening osteotomy plate.  There is evidence of osteolysis as well within the bone.  Labs will be drawn today to rule out any infectious workup for ongoing osteolysis.  At this juncture, giving his ongoing pain and evidence of osteolysis on his x-ray, I have recommended that we remove the ulnar shortening plate at this time.  Intraoperatively, we can get a better look at the ulnar shaft bony architecture to better determine if repeat fixation is required.  The alternative intraoperative judgment would be for transition to a Sauve Kapanji type procedure in which we fuse the distal radioulnar joint and allow forearm rotation through the prior osteotomy site given the ongoing osteolysis.  I explained that this decision will be made intraoperatively.  Risks and benefits of the procedure were discussed, risks including but not limited to infection, bleeding, scarring, stiffness, nerve injury, tendon injury, vascular injury, hardware complication, recurrence of symptoms and need for subsequent operation.  Patient  expressed understanding.    Will move forward with surgical scheduling of right forearm removal of hardware, possible repeat fixation of the ulnar shaft versus potential arthrodesis of the distal radial ulnar joint.  Surgery will be performed later this week.  Labs drawn today, will be reviewed, CBC, ESR, CRP.   Follow-up: No follow-ups on file.   Meds & Orders: No orders of the defined types were placed in this encounter.   Orders Placed This Encounter  Procedures   XR Wrist Complete Right   Sedimentation rate   CBC with Differential/Platelet   C-reactive protein     Procedures: No procedures performed       Objective:   Vital Signs: There were no vitals taken for this visit.  Ortho Exam Right forearm: - Well-healed ulnar incision, skin is well-approximated without erythema - Notable tenderness over the ulnar shaft, there is notable mobility of the bone with deep palpation - Instability is present of the distal radial ulnar joint with stress testing, particularly in pronation - Full digital range of motion, able to perform wrist range of motion flexion/extension, pronation/supination without significant pain  Imaging: XR Wrist Complete Right  Result Date: 02/05/2023 X-rays of the right wrist, multiple views were reviewed today X-rays demonstrate osteolysis particularly at the distal aspect of the plate surrounding the screws within the ulnar shaft, there is also notable bone void seen at the osteotomy site.  Proximal screws remain well-fixed to the bone through the proximal portion of the plate.    Thomas [name] [name], M.D. [facility] 10:17 PM

## 2023-02-05 NOTE — Telephone Encounter (Signed)
Per patients request 02/05/23 oow note faxed to [facility] [phone]

## 2023-02-06 ENCOUNTER — Other Ambulatory Visit: Payer: Self-pay

## 2023-02-06 ENCOUNTER — Encounter (HOSPITAL_BASED_OUTPATIENT_CLINIC_OR_DEPARTMENT_OTHER): Payer: Self-pay | Admitting: Orthopedic Surgery

## 2023-02-06 LAB — SEDIMENTATION RATE: Sed Rate: 67 mm/h — ABNORMAL HIGH (ref 0–20)

## 2023-02-06 LAB — CBC WITH DIFFERENTIAL/PLATELET
Absolute Lymphocytes: 854 {cells}/uL (ref 850–3900)
Absolute Monocytes: 602 {cells}/uL (ref 200–950)
Basophils Absolute: 21 {cells}/uL (ref 0–200)
Basophils Relative: 0.3 %
Eosinophils Absolute: 21 {cells}/uL (ref 15–500)
Eosinophils Relative: 0.3 %
HCT: 27.6 % — ABNORMAL LOW (ref 38.5–50.0)
Hemoglobin: 9.1 g/dL — ABNORMAL LOW (ref 13.2–17.1)
MCH: 29.5 pg (ref 27.0–33.0)
MCHC: 33 g/dL (ref 32.0–36.0)
MCV: 89.6 fL (ref 80.0–100.0)
MPV: 10.5 fL (ref 7.5–12.5)
Monocytes Relative: 8.6 %
Neutro Abs: 5502 {cells}/uL (ref 1500–7800)
Neutrophils Relative %: 78.6 %
Platelets: 101 10*3/uL — ABNORMAL LOW (ref 140–400)
RBC: 3.08 10*6/uL — ABNORMAL LOW (ref 4.20–5.80)
RDW: 21.5 % — ABNORMAL HIGH (ref 11.0–15.0)
Total Lymphocyte: 12.2 %
WBC: 7 10*3/uL (ref 3.8–10.8)

## 2023-02-06 LAB — C-REACTIVE PROTEIN: CRP: 6.3 mg/L (ref <8.0)

## 2023-02-08 ENCOUNTER — Telehealth: Payer: Self-pay

## 2023-02-08 NOTE — Telephone Encounter (Signed)
That's ok if we have nothing sooner  Steph or greg has no slot?   Thanks

## 2023-02-08 NOTE — Telephone Encounter (Signed)
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

## 2023-02-09 ENCOUNTER — Ambulatory Visit (HOSPITAL_BASED_OUTPATIENT_CLINIC_OR_DEPARTMENT_OTHER): Payer: 59 | Admitting: Anesthesiology

## 2023-02-09 ENCOUNTER — Other Ambulatory Visit: Payer: Self-pay

## 2023-02-09 ENCOUNTER — Encounter (HOSPITAL_BASED_OUTPATIENT_CLINIC_OR_DEPARTMENT_OTHER)
Admission: RE | Disposition: A | Discharge status: Home / Self Care | Payer: Self-pay | Source: Home / Self Care | Attending: Orthopedic Surgery

## 2023-02-09 ENCOUNTER — Encounter (HOSPITAL_BASED_OUTPATIENT_CLINIC_OR_DEPARTMENT_OTHER): Payer: Self-pay | Admitting: Orthopedic Surgery

## 2023-02-09 ENCOUNTER — Ambulatory Visit (HOSPITAL_BASED_OUTPATIENT_CLINIC_OR_DEPARTMENT_OTHER): Payer: 59

## 2023-02-09 ENCOUNTER — Ambulatory Visit (HOSPITAL_BASED_OUTPATIENT_CLINIC_OR_DEPARTMENT_OTHER)
Admission: RE | Admit: 2023-02-09 | Discharge: 2023-02-09 | Disposition: A | Discharge status: Home / Self Care | Payer: 59 | Attending: Orthopedic Surgery | Admitting: Orthopedic Surgery

## 2023-02-09 DIAGNOSIS — T84193A Other mechanical complication of internal fixation device of bone of left forearm, initial encounter: Secondary | ICD-10-CM | POA: Diagnosis not present

## 2023-02-09 DIAGNOSIS — T84398A Other mechanical complication of other bone devices, implants and grafts, initial encounter: Secondary | ICD-10-CM | POA: Insufficient documentation

## 2023-02-09 DIAGNOSIS — I10 Essential (primary) hypertension: Secondary | ICD-10-CM | POA: Diagnosis not present

## 2023-02-09 DIAGNOSIS — T84498A Other mechanical complication of other internal orthopedic devices, implants and grafts, initial encounter: Secondary | ICD-10-CM

## 2023-02-09 DIAGNOSIS — M9689 Other intraoperative and postprocedural complications and disorders of the musculoskeletal system: Secondary | ICD-10-CM

## 2023-02-09 DIAGNOSIS — Z01818 Encounter for other preprocedural examination: Secondary | ICD-10-CM

## 2023-02-09 DIAGNOSIS — M89531 Osteolysis, right forearm: Secondary | ICD-10-CM | POA: Diagnosis not present

## 2023-02-09 DIAGNOSIS — Y798 Miscellaneous orthopedic devices associated with adverse incidents, not elsewhere classified: Secondary | ICD-10-CM | POA: Diagnosis not present

## 2023-02-09 DIAGNOSIS — K219 Gastro-esophageal reflux disease without esophagitis: Secondary | ICD-10-CM | POA: Diagnosis not present

## 2023-02-09 HISTORY — PX: HARDWARE REMOVAL: SHX979

## 2023-02-09 HISTORY — DX: Alcohol abuse, uncomplicated: F10.10

## 2023-02-09 HISTORY — PX: IRRIGATION AND DEBRIDEMENT ELBOW: SHX6886

## 2023-02-09 HISTORY — PX: BONE BIOPSY: SHX375

## 2023-02-09 HISTORY — DX: Disease of blood and blood-forming organs, unspecified: D75.9

## 2023-02-09 SURGERY — REMOVAL, HARDWARE
Anesthesia: General | Site: Arm Lower | Laterality: Right

## 2023-02-09 MED ORDER — DIPHENHYDRAMINE HCL 50 MG/ML IJ SOLN
25.0000 mg | Freq: Once | INTRAMUSCULAR | Status: AC
  Administered 2023-02-09: 25 mg via INTRAVENOUS

## 2023-02-09 MED ORDER — FENTANYL CITRATE (PF) 100 MCG/2ML IJ SOLN
INTRAMUSCULAR | Status: AC
  Filled 2023-02-09: qty 2

## 2023-02-09 MED ORDER — CEFAZOLIN SODIUM-DEXTROSE 2-3 GM-%(50ML) IV SOLR
INTRAVENOUS | Status: DC | PRN
  Administered 2023-02-09: 2 g via INTRAVENOUS

## 2023-02-09 MED ORDER — PHENYLEPHRINE HCL-NACL 20-0.9 MG/250ML-% IV SOLN
INTRAVENOUS | Status: DC | PRN
  Administered 2023-02-09: 20 ug/min via INTRAVENOUS

## 2023-02-09 MED ORDER — PHENYLEPHRINE HCL (PRESSORS) 10 MG/ML IV SOLN
INTRAVENOUS | Status: AC
  Filled 2023-02-09: qty 1

## 2023-02-09 MED ORDER — SODIUM CHLORIDE 0.9 % IV SOLN: INTRAVENOUS | Status: DC | PRN

## 2023-02-09 MED ORDER — FENTANYL CITRATE (PF) 100 MCG/2ML IJ SOLN
100.0000 ug | Freq: Once | INTRAMUSCULAR | Status: AC
  Administered 2023-02-09: 100 ug via INTRAVENOUS

## 2023-02-09 MED ORDER — OXYCODONE HCL 5 MG PO TABS: 5.0000 mg | ORAL_TABLET | ORAL | 0 refills | Status: DC | PRN

## 2023-02-09 MED ORDER — DEXAMETHASONE SODIUM PHOSPHATE 10 MG/ML IJ SOLN
INTRAMUSCULAR | Status: AC
  Filled 2023-02-09: qty 1

## 2023-02-09 MED ORDER — 0.9 % SODIUM CHLORIDE (POUR BTL) OPTIME
TOPICAL | Status: DC | PRN
  Administered 2023-02-09: 2000 mL

## 2023-02-09 MED ORDER — PROPOFOL 10 MG/ML IV BOLUS
INTRAVENOUS | Status: DC | PRN
  Administered 2023-02-09: 200 mg via INTRAVENOUS

## 2023-02-09 MED ORDER — VASOPRESSIN 20 UNIT/ML IV SOLN
INTRAVENOUS | Status: AC
  Filled 2023-02-09: qty 1

## 2023-02-09 MED ORDER — PROPOFOL 10 MG/ML IV BOLUS
INTRAVENOUS | Status: AC
  Filled 2023-02-09: qty 20

## 2023-02-09 MED ORDER — DOXYCYCLINE HYCLATE 100 MG PO CAPS: 100.0000 mg | ORAL_CAPSULE | Freq: Two times a day (BID) | ORAL | 0 refills | Status: DC

## 2023-02-09 MED ORDER — LACTATED RINGERS IV SOLN
INTRAVENOUS | Status: DC
Start: 2023-02-09 — End: 2023-02-09

## 2023-02-09 MED ORDER — MIDAZOLAM HCL 2 MG/2ML IJ SOLN
2.0000 mg | Freq: Once | INTRAMUSCULAR | Status: AC
  Administered 2023-02-09: 2 mg via INTRAVENOUS

## 2023-02-09 MED ORDER — MIDAZOLAM HCL 2 MG/2ML IJ SOLN
INTRAMUSCULAR | Status: AC
  Filled 2023-02-09: qty 2

## 2023-02-09 MED ORDER — BUPIVACAINE-EPINEPHRINE (PF) 0.5% -1:200000 IJ SOLN
INTRAMUSCULAR | Status: DC | PRN
  Administered 2023-02-09: 30 mL via PERINEURAL

## 2023-02-09 MED ORDER — PHENYLEPHRINE 80 MCG/ML (10ML) SYRINGE FOR IV PUSH (FOR BLOOD PRESSURE SUPPORT)
PREFILLED_SYRINGE | INTRAVENOUS | Status: AC
  Filled 2023-02-09: qty 10

## 2023-02-09 MED ORDER — ONDANSETRON HCL 4 MG/2ML IJ SOLN
INTRAMUSCULAR | Status: AC
  Filled 2023-02-09: qty 2

## 2023-02-09 MED ORDER — LIDOCAINE 2% (20 MG/ML) 5 ML SYRINGE
INTRAMUSCULAR | Status: AC
  Filled 2023-02-09: qty 5

## 2023-02-09 MED ORDER — ACETAMINOPHEN 500 MG PO TABS
1000.0000 mg | ORAL_TABLET | Freq: Once | ORAL | Status: AC
  Administered 2023-02-09: 1000 mg via ORAL

## 2023-02-09 MED ORDER — VANCOMYCIN HCL 500 MG IV SOLR
INTRAVENOUS | Status: DC | PRN
  Administered 2023-02-09: 500 mg

## 2023-02-09 MED ORDER — DEXAMETHASONE SODIUM PHOSPHATE 10 MG/ML IJ SOLN
INTRAMUSCULAR | Status: DC | PRN
  Administered 2023-02-09: 10 mg via INTRAVENOUS

## 2023-02-09 MED ORDER — ONDANSETRON HCL 4 MG/2ML IJ SOLN
INTRAMUSCULAR | Status: DC | PRN
  Administered 2023-02-09: 4 mg via INTRAVENOUS

## 2023-02-09 MED ORDER — DIPHENHYDRAMINE HCL 50 MG/ML IJ SOLN
INTRAMUSCULAR | Status: AC
  Filled 2023-02-09: qty 1

## 2023-02-09 MED ORDER — PHENYLEPHRINE 80 MCG/ML (10ML) SYRINGE FOR IV PUSH (FOR BLOOD PRESSURE SUPPORT)
PREFILLED_SYRINGE | INTRAVENOUS | Status: DC | PRN
  Administered 2023-02-09: 160 ug via INTRAVENOUS
  Administered 2023-02-09: 200 ug via INTRAVENOUS
  Administered 2023-02-09 (×4): 160 ug via INTRAVENOUS

## 2023-02-09 MED ORDER — FENTANYL CITRATE (PF) 250 MCG/5ML IJ SOLN
INTRAMUSCULAR | Status: DC | PRN
  Administered 2023-02-09 (×2): 25 ug via INTRAVENOUS

## 2023-02-09 MED ORDER — VANCOMYCIN HCL 500 MG IV SOLR
INTRAVENOUS | Status: AC
  Filled 2023-02-09: qty 10

## 2023-02-09 MED ORDER — CEFAZOLIN SODIUM 1 G IJ SOLR
INTRAMUSCULAR | Status: AC
  Filled 2023-02-09: qty 20

## 2023-02-09 MED ORDER — LIDOCAINE 2% (20 MG/ML) 5 ML SYRINGE
INTRAMUSCULAR | Status: DC | PRN
  Administered 2023-02-09: 20 mg via INTRAVENOUS

## 2023-02-09 MED ORDER — CIPROFLOXACIN HCL 500 MG PO TABS: 500.0000 mg | ORAL_TABLET | Freq: Two times a day (BID) | ORAL | 0 refills | Status: DC

## 2023-02-09 MED ORDER — ALBUMIN HUMAN 5 % IV SOLN: INTRAVENOUS | Status: DC | PRN

## 2023-02-09 MED ORDER — HYDROMORPHONE HCL 1 MG/ML IJ SOLN: 0.2500 mg | INTRAMUSCULAR | Status: DC | PRN

## 2023-02-09 SURGICAL SUPPLY — 48 items
APPLICATOR CHLORAPREP 3ML ORNG (MISCELLANEOUS) ×2 IMPLANT
BLADE ARTHRO LOK 4 BEAVER (BLADE) IMPLANT
BLADE SURG 15 STRL LF DISP TIS (BLADE) ×4 IMPLANT
BNDG COHESIVE 4X5 TAN STRL LF (GAUZE/BANDAGES/DRESSINGS) ×2 IMPLANT
BNDG ELASTIC 3INX 5YD STR LF (GAUZE/BANDAGES/DRESSINGS) IMPLANT
BNDG ELASTIC 4INX 5YD STR LF (GAUZE/BANDAGES/DRESSINGS) ×2 IMPLANT
BNDG ESMARK 4X9 LF (GAUZE/BANDAGES/DRESSINGS) ×2 IMPLANT
BNDG GAUZE DERMACEA FLUFF 4 (GAUZE/BANDAGES/DRESSINGS) ×2 IMPLANT
CHLORAPREP W/TINT 26 (MISCELLANEOUS) ×2 IMPLANT
CNTNR URN SCR LID CUP LEK RST (MISCELLANEOUS) ×5 IMPLANT
CORD BIPOLAR FORCEPS 12FT (ELECTRODE) ×2 IMPLANT
COVER BACK TABLE 60X90IN (DRAPES) ×2 IMPLANT
CUFF TOURN SGL QUICK 18X4 (TOURNIQUET CUFF) ×1 IMPLANT
DRAPE HAND 75INX146IN 110IN (DRAPES) ×2 IMPLANT
DRAPE OEC MINIVIEW 54X84 (DRAPES) ×2 IMPLANT
DRAPE SURG 17X23 STRL (DRAPES) ×2 IMPLANT
DRIVER QUICK CONNECT T10 (MISCELLANEOUS) ×1 IMPLANT
DRSG TELFA 3X8 NADH STRL (GAUZE/BANDAGES/DRESSINGS) IMPLANT
GAUZE 4X4 16PLY ~~LOC~~+RFID DBL (SPONGE) ×1 IMPLANT
GAUZE SPONGE 4X4 12PLY STRL (GAUZE/BANDAGES/DRESSINGS) ×3 IMPLANT
GAUZE STRETCH 2X75IN STRL (MISCELLANEOUS) ×2 IMPLANT
GAUZE XEROFORM 1X8 LF (GAUZE/BANDAGES/DRESSINGS) ×3 IMPLANT
GLOVE BIO SURGEON STRL SZ7 (GLOVE) ×3 IMPLANT
GLOVE BIO SURGEON STRL SZ7.5 (GLOVE) ×2 IMPLANT
GLOVE BIOGEL PI IND STRL 7.0 (GLOVE) ×3 IMPLANT
GLOVE BIOGEL PI IND STRL 7.5 (GLOVE) ×3 IMPLANT
GOWN STRL REUS W/ TWL LRG LVL3 (GOWN DISPOSABLE) ×3 IMPLANT
GOWN STRL REUS W/TWL XL LVL3 (GOWN DISPOSABLE) IMPLANT
GOWN STRL SURGICAL XL XLNG (GOWN DISPOSABLE) ×4 IMPLANT
NDL HYPO 25X5/8 SAFETYGLIDE (NEEDLE) IMPLANT
NEEDLE HYPO 25X5/8 SAFETYGLIDE (NEEDLE) IMPLANT
NS IRRIG 1000ML POUR BTL (IV SOLUTION) ×2 IMPLANT
PACK BASIN DAY SURGERY FS (CUSTOM PROCEDURE TRAY) ×2 IMPLANT
SHEET MEDIUM DRAPE 40X70 STRL (DRAPES) ×2 IMPLANT
SLING ARM FOAM STRAP LRG (SOFTGOODS) ×1 IMPLANT
SPIKE FLUID TRANSFER (MISCELLANEOUS) IMPLANT
SPLINT PLASTER CAST XFAST 4X15 (CAST SUPPLIES) ×30 IMPLANT
STOCKINETTE IMPERVIOUS 9X36 MD (GAUZE/BANDAGES/DRESSINGS) IMPLANT
SUCTION TUBE FRAZIER 10FR DISP (SUCTIONS) ×1 IMPLANT
SUT ETHILON 4 0 PS 2 18 (SUTURE) ×3 IMPLANT
SUT MNCRL AB 4-0 PS2 18 (SUTURE) ×1 IMPLANT
SUT VIC AB 3-0 SH 27X BRD (SUTURE) ×2 IMPLANT
SWAB COLLECTION DEVICE MRSA (MISCELLANEOUS) ×1 IMPLANT
SWAB CULTURE ESWAB REG 1ML (MISCELLANEOUS) ×1 IMPLANT
SYR BULB EAR ULCER 3OZ GRN STR (SYRINGE) ×4 IMPLANT
SYR CONTROL 10ML LL (SYRINGE) IMPLANT
TOWEL GREEN STERILE FF (TOWEL DISPOSABLE) ×4 IMPLANT
TUBE CONNECTING 20X1/4 (TUBING) ×1 IMPLANT

## 2023-02-09 NOTE — Progress Notes (Signed)
Assisted Dr. Edmond Fitzgerald with right, supraclavicular, ultrasound guided block. Side rails up, monitors on throughout procedure. See vital signs in flow sheet. Tolerated Procedure well. 

## 2023-02-09 NOTE — Anesthesia Postprocedure Evaluation (Signed)
Anesthesia Post Note  Patient: Thomas [name]  Procedure(s) Performed: RIGHT FOREARM HARDWARE REMOVAL (Right: Arm Lower) RIGHT FOREARM IRRIGATION AND DEBRIDEMENT (Right: Arm Lower) BONE BIOPSY (Right: Arm Lower)     Patient location during evaluation: PACU Anesthesia Type: General and Regional Level of consciousness: awake and alert Pain management: pain level controlled Vital Signs Assessment: post-procedure vital signs reviewed and stable Respiratory status: spontaneous breathing, nonlabored ventilation and respiratory function stable Cardiovascular status: blood pressure returned to baseline and stable Postop Assessment: no apparent nausea or vomiting Anesthetic complications: no  No notable events documented.  Last Vitals:  Vitals:   02/09/23 1000 02/09/23 1015  BP: (!) 147/76 (!) 143/72  Pulse: (!) 108 (!) 101  Resp: (!) 33 (!) 24  Temp:    SpO2: 96% 94%    Last Pain:  Vitals:   02/09/23 1000  TempSrc:   PainSc: 0-No pain                 [name],W. EDMOND

## 2023-02-09 NOTE — Transfer of Care (Signed)
Immediate Anesthesia Transfer of Care Note  Patient: Thomas [name]  Procedure(s) Performed: RIGHT FOREARM MINOR HARDWARE REMOVAL (Right: Arm Lower) RIGHT FOREARM IRRIGATION AND DEBRIDEMENT (Right: Arm Lower) BONE BIOPSY (Right: Arm Lower)  Patient Location: PACU  Anesthesia Type:General  Level of Consciousness: awake  Airway & Oxygen Therapy: Patient Spontanous Breathing and Patient connected to face mask oxygen  Post-op Assessment: Report given to RN and Post -op Vital signs reviewed and stable  Post vital signs: Reviewed and stable  Last Vitals:  Vitals Value Taken Time  BP 136/74 02/09/23 0931  Temp    Pulse 117 02/09/23 0935  Resp 20 02/09/23 0935  SpO2 94 % 02/09/23 0935  Vitals shown include unfiled device data.  Last Pain:  Vitals:   02/09/23 0623  TempSrc: Temporal  PainSc: 0-No pain         Complications: No notable events documented.

## 2023-02-09 NOTE — Discharge Instructions (Addendum)
Hand Surgery Postop Instructions   Dressings: Maintain postoperative dressing until orthopedic follow-up.  Keep operative site clean and dry until orthopedic follow-up.  Wound Care: Keep your hand elevated above the level of your heart.  Do not allow it to dangle by your side. Moving your fingers is advised to stimulate circulation but will depend on the site of your surgery.  If you have a splint applied, your doctor will advise you regarding movement.  Activity: Do not drive or operate machinery until clearance given from physician. No heavy lifting with operative extremity.  Diet:  Drink liquids today or eat a light diet.  You may resume a regular diet tomorrow.    General expectations: Take prescribed medication if given, transition to over-the-counter medication as quickly as possible. Fingers may become slightly swollen.  Call your doctor if any of the following occur: Severe pain not relieved by pain medication. Elevated temperature. Dressing soaked with blood. Inability to move fingers. White or bluish color to fingers.   Per [facility] clinic policy, our goal is ensure optimal postoperative pain control with a multimodal pain management strategy. For all [facility] patients, our goal is to wean post-operative narcotic medications by 6 weeks post-operatively. If this is not possible due to utilization of pain medication prior to surgery, your [facility] doctor will support your acute post-operative pain control for the first 6 weeks postoperatively, with a plan to transition you back to your primary pain team following that. Cyndia Skeeters will work to ensure a Therapist, occupational.  Anshul [name], M.D. Hand Surgery [facility]   No Tylenol before 1:00pm   Post Anesthesia Home Care Instructions  Activity: Get plenty of rest for the remainder of the day. A responsible individual must stay with you for 24 hours following the procedure.  For the next 24  hours, DO NOT: -Drive a car -Advertising copywriter -Drink alcoholic beverages -Take any medication unless instructed by your physician -Make any legal decisions or sign important papers.  Meals: Start with liquid foods such as gelatin or soup. Progress to regular foods as tolerated. Avoid greasy, spicy, heavy foods. If nausea and/or vomiting occur, drink only clear liquids until the nausea and/or vomiting subsides. Call your physician if vomiting continues.  Special Instructions/Symptoms: Your throat may feel dry or sore from the anesthesia or the breathing tube placed in your throat during surgery. If this causes discomfort, gargle with warm salt water. The discomfort should disappear within 24 hours.  If you had a scopolamine patch placed behind your ear for the management of post- operative nausea and/or vomiting:  1. The medication in the patch is effective for 72 hours, after which it should be removed.  Wrap patch in a tissue and discard in the trash. Wash hands thoroughly with soap and water. 2. You may remove the patch earlier than 72 hours if you experience unpleasant side effects which may include dry mouth, dizziness or visual disturbances. 3. Avoid touching the patch. Wash your hands with soap and water after contact with the patch.    Regional Anesthesia Blocks  1. You may not be able to move or feel the "blocked" extremity after a regional anesthetic block. This may last may last from 3-48 hours after placement, but it will go away. The length of time depends on the medication injected and your individual response to the medication. As the nerves start to wake up, you may experience tingling as the movement and feeling returns to your extremity. If the  numbness and inability to move your extremity has not gone away after 48 hours, please call your surgeon.   2. The extremity that is blocked will need to be protected until the numbness is gone and the strength has returned. Because  you cannot feel it, you will need to take extra care to avoid injury. Because it may be weak, you may have difficulty moving it or using it. You may not know what position it is in without looking at it while the block is in effect.  3. For blocks in the legs and feet, returning to weight bearing and walking needs to be done carefully. You will need to wait until the numbness is entirely gone and the strength has returned. You should be able to move your leg and foot normally before you try and bear weight or walk. You will need someone to be with you when you first try to ensure you do not fall and possibly risk injury.  4. Bruising and tenderness at the needle site are common side effects and will resolve in a few days.  5. Persistent numbness or new problems with movement should be communicated to the surgeon or the [facility] [phone] [facility] [phone]).

## 2023-02-09 NOTE — Anesthesia Procedure Notes (Signed)
Procedure Name: LMA Insertion Date/Time: 02/09/2023 7:38 AM  Performed by: Roosvelt Harps, CRNAPre-anesthesia Checklist: Patient identified, Emergency Drugs available, Suction available and Patient being monitored Patient Re-evaluated:Patient Re-evaluated prior to induction Oxygen Delivery Method: Circle System Utilized Preoxygenation: Pre-oxygenation with 100% oxygen Induction Type: IV induction Ventilation: Mask ventilation without difficulty LMA: LMA inserted LMA Size: 5.0 Number of attempts: 1 Placement Confirmation: positive ETCO2 and breath sounds checked- equal and bilateral Tube secured with: Tape Dental Injury: Teeth and Oropharynx as per pre-operative assessment

## 2023-02-09 NOTE — Anesthesia Preprocedure Evaluation (Addendum)
Anesthesia Evaluation  Patient identified by MRN, date of birth, ID band Patient awake    Reviewed: Allergy & Precautions, H&P , NPO status , Patient's Chart, lab work & pertinent test results  Airway Mallampati: II  TM Distance: >3 FB Neck ROM: Full    Dental no notable dental hx. (+) Teeth Intact, Dental Advisory Given   Pulmonary neg pulmonary ROS   Pulmonary exam normal breath sounds clear to auscultation       Cardiovascular hypertension, Pt. on medications  Rhythm:Regular Rate:Normal     Neuro/Psych negative neurological ROS  negative psych ROS   GI/Hepatic ,GERD  Medicated,,(+)     substance abuse  alcohol use  Endo/Other  negative endocrine ROS    Renal/GU negative Renal ROS  negative genitourinary   Musculoskeletal   Abdominal   Peds  Hematology negative hematology ROS (+)   Anesthesia Other Findings   Reproductive/Obstetrics negative OB ROS                             Anesthesia Physical Anesthesia Plan  ASA: 2  Anesthesia Plan: General   Post-op Pain Management: Regional block* and Tylenol PO (pre-op)*   Induction: Intravenous  PONV Risk Score and Plan: 2 and Ondansetron, Dexamethasone, Midazolam, Propofol infusion and TIVA  Airway Management Planned: LMA  Additional Equipment:   Intra-op Plan:   Post-operative Plan: Extubation in OR  Informed Consent: I have reviewed the patients History and Physical, chart, labs and discussed the procedure including the risks, benefits and alternatives for the proposed anesthesia with the patient or authorized representative who has indicated his/her understanding and acceptance.     Dental advisory given  Plan Discussed with: CRNA  Anesthesia Plan Comments:        Anesthesia Quick Evaluation

## 2023-02-09 NOTE — Op Note (Signed)
NAME: Thomas [name] MEDICAL RECORD NO: [id number] DATE OF BIRTH: 06-30-1960 FACILITY: [facility] LOCATION: [name] PHYSICIAN: Samuella Cota, MD   OPERATIVE REPORT   DATE OF PROCEDURE: 02/09/23    PREOPERATIVE DIAGNOSIS: Right forearm hardware failure with nonunion, associated osteolysis   POSTOPERATIVE DIAGNOSIS: Right arm forearm hardware failure with nonunion, associated osteolysis   PROCEDURE: Right wrist ulnar shortening osteotomy plate removal Bone biopsies with cultures distal ulna   SURGEON:  Samuella Cota, M.D.   ASSISTANT: Glynn Octave, OPA   ANESTHESIA:  Regional with sedation   INTRAVENOUS FLUIDS:  Per anesthesia flow sheet.   ESTIMATED BLOOD LOSS:  Minimal.   COMPLICATIONS:  None.   SPECIMENS: Multiple bone cultures taken from osteotomy site, distal and proximal ulna   TOURNIQUET TIME:   40 minutes   DISPOSITION:  Stable to PACU.   INDICATIONS: 62 year old male with history of right forearm ulnar shortening osteotomy with plate fixation.  He was seen in the outpatient setting with subsequent pain and evidence of hardware failure with osteolysis at the distal ulna hardware site.  Workup was performed in the outpatient setting, ESR was deemed to be elevated with associated intermittent fevers.  For this reason, he was indicated for right wrist hardware removal with bone cultures to be performed prior to any repeat fixation.  The various methods of treatment have been discussed with the patient and family. After consideration of risks, benefits and other options for treatment, the patient has consented to  Procedure(s): RIGHT FOREARM MINOR HARDWARE REMOVAL (Right) RIGHT FOREARM IRRIGATION AND DEBRIDEMENT (Right) BONE BIOPSY (Right) as a surgical intervention.  The patient's history has been reviewed, patient examined, no change in status, stable for surgery.  I have reviewed the patient's chart and labs.  Questions were answered to the  patient's satisfaction.     Given his recent labs showing elevated ESR and osteolysis on XR, we have determined that the most appropriate next option is for removal hardware with bone biopsy and cultures to determine possible infection before proceeding with hardware fixation once again. Risks and benefits of surgery were discussed including the risks of infection, bleeding, scarring, stiffness, nerve injury, vascular injury, tendon injury, need for subsequent operation, , nonunion, malunion.  He voiced understanding of these risks and elected to proceed.   OPERATIVE COURSE: Patient was seen and identified in the preoperative area and marked appropriately.  Surgical consent had been signed. Antibiotics were held for intraoperative cultures. He was transferred to the operating room and placed in supine position with the right upper extremity on an arm board.  Sedation was induced by the anesthesiologist. A regional block had been performed by anesthesia in preoperative holding.    Right upper extremity was prepped and draped in normal sterile orthopedic fashion.  A surgical pause was performed between the surgeons, anesthesia, and operating room staff and all were in agreement as to the patient, procedure, and site of procedure.  Tourniquet was placed and padded appropriately to the right upper extremity.  The arm was exsanguinated and the tourniquet was inflated 250 mmHg.  Prior incision over the ulnar shaft was reopened sharply utilizing a 15 blade.  Dissection was performed down, prior interval between the ECU and FCU musculature was once again utilized.  No significant egress of fluid or purulence was encountered initially.  There was some reactive inflammatory tissue notable in this area with fibrous tissue as well.  We were able to dissect down and identify the ulnar shortening osteotomy plate,  there is noted to be significant loosening of the distal screws as seen preoperatively.  Proximal screws  remained well-fixed.  Screws were removed both proximally and distally and the plate was removed atraumatically.  Once again, no significant purulence was encountered.  Bony curettage was performed along the ulnar shaft as well as of all the screw holes with copious irrigation.  There was no significant involucrum or sequestrum encountered.  Under gentle manipulation, there was noted to be fibrous union of the ulnar shaft without significant mobility.  DRUJ was noted to be unstable however, consistent with the preoperative clinical examination.  At this juncture, bone cultures were taken of the periosteum, proximal and distal ulna as well as the osteotomy site.  Antibiotics were then given as planned.  Vancomycin powder was also placed into the wound.  Copious irrigation was performed, tourniquet was deflated and bipolar electrocautery was utilized for hemostasis.  The tourniquet was deflated at 40 minutes.  Fingertips were pink with brisk capillary refill after deflation of tourniquet.  Layered closure was performed utilizing 3-0 Vicryl for the fascial and subcutaneous layers, followed by 4-0 nylon for the skin surface in horizontal mattress fashion.  Sterile dressings were applied followed by application of a sugar-tong splint using plaster.  The operative drapes were broken down.  The patient was awoken from anesthesia safely and taken to PACU in stable condition.  I will see him back in the office in 1 week for postoperative followup.  We will follow-up his bone cultures as planned.  He does have follow-up arranged with both myself and infectious disease based on the culture results.  He will be placed on broad-spectrum antibiotics as outpatient per infectious disease recommendation currently pending speciation.   Samuella Cota, MD Electronically signed, 02/09/23

## 2023-02-09 NOTE — Anesthesia Procedure Notes (Signed)
Anesthesia Regional Block: Supraclavicular block   Pre-Anesthetic Checklist: , timeout performed,  Correct Patient, Correct Site, Correct Laterality,  Correct Procedure, Correct Position, site marked,  Risks and benefits discussed,  Pre-op evaluation,  At surgeon's request and post-op pain management  Laterality: Right  Prep: Maximum Sterile Barrier Precautions used, chloraprep       Needles:  Injection technique: Single-shot  Needle Type: Echogenic Stimulator Needle     Needle Length: 5cm  Needle Gauge: 22     Additional Needles:   Procedures:,,,, ultrasound used (permanent image in chart),,    Narrative:  Start time: 02/09/2023 6:59 AM End time: 02/09/2023 7:09 AM Injection made incrementally with aspirations every 5 mL.  Performed by: Personally  Anesthesiologist: Gaynelle Adu, MD  Additional Notes:

## 2023-02-09 NOTE — Interval H&P Note (Signed)
History and Physical Interval Note:  02/09/2023 6:47 AM  Thomas [name]  has presented today for surgery, with the diagnosis of RIGHT FOREARM HARDWARE FAILURE.  The various methods of treatment have been discussed with the patient and family. After consideration of risks, benefits and other options for treatment, the patient has consented to  Procedure(s): RIGHT FOREARM MINOR HARDWARE REMOVAL (Right) RIGHT FOREARM IRRIGATION AND DEBRIDEMENT (Right) BONE BIOPSY (Right) as a surgical intervention.  The patient's history has been reviewed, patient examined, no change in status, stable for surgery.  I have reviewed the patient's chart and labs.  Questions were answered to the patient's satisfaction.    Given his recent labs showing elevated ESR and osteolysis on XR, we have determined that the most appropriate next option is for removal hardware with bone biopsy and cultures to determine possible infection before proceeding with hardware fixation once again.   [name]

## 2023-02-10 ENCOUNTER — Encounter (HOSPITAL_BASED_OUTPATIENT_CLINIC_OR_DEPARTMENT_OTHER): Payer: Self-pay | Admitting: Orthopedic Surgery

## 2023-02-11 LAB — ACID FAST SMEAR (AFB, MYCOBACTERIA)
Acid Fast Smear: NEGATIVE
Acid Fast Smear: NEGATIVE
Acid Fast Smear: NEGATIVE
Acid Fast Smear: NEGATIVE
Acid Fast Smear: NEGATIVE

## 2023-02-14 LAB — AEROBIC/ANAEROBIC CULTURE W GRAM STAIN (SURGICAL/DEEP WOUND)
Gram Stain: NONE SEEN
Gram Stain: NONE SEEN
Gram Stain: NONE SEEN
Gram Stain: NONE SEEN
Gram Stain: NONE SEEN

## 2023-02-16 ENCOUNTER — Telehealth: Payer: Self-pay | Admitting: Orthopedic Surgery

## 2023-02-16 ENCOUNTER — Ambulatory Visit (INDEPENDENT_AMBULATORY_CARE_PROVIDER_SITE_OTHER): Payer: 59 | Admitting: Orthopedic Surgery

## 2023-02-16 ENCOUNTER — Other Ambulatory Visit: Payer: Self-pay | Admitting: Orthopedic Surgery

## 2023-02-16 ENCOUNTER — Encounter (HOSPITAL_BASED_OUTPATIENT_CLINIC_OR_DEPARTMENT_OTHER): Payer: Self-pay | Admitting: Orthopedic Surgery

## 2023-02-16 DIAGNOSIS — M25831 Other specified joint disorders, right wrist: Secondary | ICD-10-CM

## 2023-02-16 NOTE — Telephone Encounter (Signed)
Patient requesting an extended out of work note for 6 weeks post 02/27/23 surgery. Patient wants you to notify [employer] for him so they can extend [facility] paperwork. Please call patient once note is forwarded to [employer].

## 2023-02-16 NOTE — Progress Notes (Unsigned)
   Jarren [facility] - 62 y.o. male MRN [id number]  Date of birth: 22-Apr-1960  Office Visit Note: Visit Date: 02/16/2023 PCP: Assunta Found, MD Referred by: Assunta Found, MD  Subjective:  HPI: Thomas [name] is a 62 y.o. male who presents today for follow up 1 week status post right forearm hardware removal.  Pertinent ROS were reviewed with the patient and found to be negative unless otherwise specified above in HPI.   Assessment & Plan: Visit Diagnoses: No diagnosis found.  Plan: ***  Follow-up: No follow-ups on file.   Meds & Orders: No orders of the defined types were placed in this encounter.  No orders of the defined types were placed in this encounter.    Procedures: No procedures performed       Objective:   Vital Signs: There were no vitals taken for this visit.  Ortho Exam ***  Imaging: No results found.   Eliseo Withers [name], M.D. [facility] 10:56 AM

## 2023-02-23 ENCOUNTER — Encounter: Payer: 59 | Admitting: Orthopedic Surgery

## 2023-02-25 NOTE — Anesthesia Preprocedure Evaluation (Signed)
Anesthesia Evaluation    Reviewed: Allergy & Precautions, Patient's Chart, lab work & pertinent test results  Airway        Dental   Pulmonary           Cardiovascular hypertension,      Neuro/Psych    GI/Hepatic ,GERD  ,,(+)     substance abuse  alcohol use  Endo/Other    Renal/GU      Musculoskeletal   Abdominal   Peds  Hematology   Anesthesia Other Findings   Reproductive/Obstetrics                              Anesthesia Physical Anesthesia Plan  ASA: 2  Anesthesia Plan: Regional and MAC   Post-op Pain Management: Minimal or no pain anticipated, Regional block*, Ofirmev IV (intra-op)* and Precedex   Induction:   PONV Risk Score and Plan: Midazolam, Propofol infusion, Treatment may vary due to age or medical condition and Aprepitant  Airway Management Planned: Simple Face Mask and Natural Airway  Additional Equipment: None  Intra-op Plan:   Post-operative Plan:   Informed Consent:      Dental advisory given  Plan Discussed with: CRNA and Anesthesiologist  Anesthesia Plan Comments:          Anesthesia Quick Evaluation

## 2023-02-26 ENCOUNTER — Ambulatory Visit: Payer: 59 | Admitting: Internal Medicine

## 2023-02-26 ENCOUNTER — Other Ambulatory Visit: Payer: Self-pay | Admitting: Orthopedic Surgery

## 2023-02-26 MED ORDER — OXYCODONE HCL 5 MG PO TABS: 5.0000 mg | ORAL_TABLET | Freq: Four times a day (QID) | ORAL | 0 refills | Status: DC | PRN

## 2023-02-27 ENCOUNTER — Ambulatory Visit (HOSPITAL_BASED_OUTPATIENT_CLINIC_OR_DEPARTMENT_OTHER): Payer: Self-pay | Admitting: Anesthesiology

## 2023-02-27 ENCOUNTER — Ambulatory Visit (HOSPITAL_BASED_OUTPATIENT_CLINIC_OR_DEPARTMENT_OTHER)
Admission: RE | Admit: 2023-02-27 | Discharge: 2023-02-27 | Disposition: A | Discharge status: Home / Self Care | Payer: 59 | Attending: Orthopedic Surgery | Admitting: Orthopedic Surgery

## 2023-02-27 ENCOUNTER — Encounter (HOSPITAL_BASED_OUTPATIENT_CLINIC_OR_DEPARTMENT_OTHER)
Admission: RE | Disposition: A | Discharge status: Home / Self Care | Payer: Self-pay | Source: Home / Self Care | Attending: Orthopedic Surgery

## 2023-02-27 ENCOUNTER — Other Ambulatory Visit: Payer: Self-pay

## 2023-02-27 ENCOUNTER — Encounter (HOSPITAL_BASED_OUTPATIENT_CLINIC_OR_DEPARTMENT_OTHER): Payer: Self-pay | Admitting: Orthopedic Surgery

## 2023-02-27 ENCOUNTER — Ambulatory Visit (HOSPITAL_BASED_OUTPATIENT_CLINIC_OR_DEPARTMENT_OTHER): Payer: 59

## 2023-02-27 DIAGNOSIS — S52251K Displaced comminuted fracture of shaft of ulna, right arm, subsequent encounter for closed fracture with nonunion: Secondary | ICD-10-CM | POA: Diagnosis not present

## 2023-02-27 DIAGNOSIS — S52201K Unspecified fracture of shaft of right ulna, subsequent encounter for closed fracture with nonunion: Secondary | ICD-10-CM

## 2023-02-27 DIAGNOSIS — M9689 Other intraoperative and postprocedural complications and disorders of the musculoskeletal system: Secondary | ICD-10-CM | POA: Insufficient documentation

## 2023-02-27 DIAGNOSIS — K219 Gastro-esophageal reflux disease without esophagitis: Secondary | ICD-10-CM | POA: Diagnosis not present

## 2023-02-27 DIAGNOSIS — M25331 Other instability, right wrist: Secondary | ICD-10-CM | POA: Diagnosis not present

## 2023-02-27 DIAGNOSIS — I10 Essential (primary) hypertension: Secondary | ICD-10-CM | POA: Diagnosis not present

## 2023-02-27 DIAGNOSIS — X58XXXA Exposure to other specified factors, initial encounter: Secondary | ICD-10-CM | POA: Diagnosis not present

## 2023-02-27 DIAGNOSIS — S52201G Unspecified fracture of shaft of right ulna, subsequent encounter for closed fracture with delayed healing: Secondary | ICD-10-CM

## 2023-02-27 DIAGNOSIS — S52253K Displaced comminuted fracture of shaft of ulna, unspecified arm, subsequent encounter for closed fracture with nonunion: Secondary | ICD-10-CM

## 2023-02-27 HISTORY — PX: ORIF ULNAR FRACTURE: SHX5417

## 2023-02-27 SURGERY — OPEN REDUCTION INTERNAL FIXATION (ORIF) ULNAR FRACTURE
Anesthesia: Monitor Anesthesia Care | Site: Wrist | Laterality: Right

## 2023-02-27 MED ORDER — BUPIVACAINE HCL (PF) 0.25 % IJ SOLN
INTRAMUSCULAR | Status: AC
  Filled 2023-02-27: qty 30

## 2023-02-27 MED ORDER — ONDANSETRON HCL 4 MG/2ML IJ SOLN
INTRAMUSCULAR | Status: DC | PRN
  Administered 2023-02-27: 4 mg via INTRAVENOUS

## 2023-02-27 MED ORDER — DEXMEDETOMIDINE HCL IN NACL 80 MCG/20ML IV SOLN
INTRAVENOUS | Status: DC | PRN
  Administered 2023-02-27 (×2): 4 ug via INTRAVENOUS

## 2023-02-27 MED ORDER — BUPIVACAINE HCL 0.25 % IJ SOLN
INTRAMUSCULAR | Status: DC | PRN
  Administered 2023-02-27: 6 mL

## 2023-02-27 MED ORDER — FENTANYL CITRATE (PF) 100 MCG/2ML IJ SOLN: 25.0000 ug | INTRAMUSCULAR | Status: DC | PRN

## 2023-02-27 MED ORDER — LIDOCAINE 2% (20 MG/ML) 5 ML SYRINGE
INTRAMUSCULAR | Status: AC
  Filled 2023-02-27: qty 5

## 2023-02-27 MED ORDER — PROPOFOL 10 MG/ML IV BOLUS
INTRAVENOUS | Status: AC
  Filled 2023-02-27: qty 20

## 2023-02-27 MED ORDER — 0.9 % SODIUM CHLORIDE (POUR BTL) OPTIME
TOPICAL | Status: DC | PRN
  Administered 2023-02-27: 500 mL

## 2023-02-27 MED ORDER — ACETAMINOPHEN 10 MG/ML IV SOLN
INTRAVENOUS | Status: DC | PRN
  Administered 2023-02-27: 1000 mg via INTRAVENOUS

## 2023-02-27 MED ORDER — CEFAZOLIN SODIUM-DEXTROSE 2-4 GM/100ML-% IV SOLN
INTRAVENOUS | Status: AC
  Filled 2023-02-27: qty 100

## 2023-02-27 MED ORDER — ACETAMINOPHEN 10 MG/ML IV SOLN
INTRAVENOUS | Status: AC
  Filled 2023-02-27: qty 100

## 2023-02-27 MED ORDER — CLONIDINE HCL (ANALGESIA) 100 MCG/ML EP SOLN
EPIDURAL | Status: DC | PRN
  Administered 2023-02-27: 100 ug

## 2023-02-27 MED ORDER — MIDAZOLAM HCL 2 MG/2ML IJ SOLN
INTRAMUSCULAR | Status: AC
  Filled 2023-02-27: qty 2

## 2023-02-27 MED ORDER — ONDANSETRON HCL 4 MG/2ML IJ SOLN
INTRAMUSCULAR | Status: AC
  Filled 2023-02-27: qty 2

## 2023-02-27 MED ORDER — ROPIVACAINE HCL 5 MG/ML IJ SOLN
INTRAMUSCULAR | Status: DC | PRN
  Administered 2023-02-27: 30 mL via PERINEURAL

## 2023-02-27 MED ORDER — MIDAZOLAM HCL 2 MG/2ML IJ SOLN
2.0000 mg | Freq: Once | INTRAMUSCULAR | Status: AC
  Administered 2023-02-27: 2 mg via INTRAVENOUS

## 2023-02-27 MED ORDER — ONDANSETRON HCL 4 MG/2ML IJ SOLN
4.0000 mg | Freq: Once | INTRAMUSCULAR | Status: DC | PRN
Start: 2023-02-27 — End: 2023-02-27

## 2023-02-27 MED ORDER — ACETAMINOPHEN 10 MG/ML IV SOLN: 1000.0000 mg | Freq: Once | INTRAVENOUS | Status: DC | PRN

## 2023-02-27 MED ORDER — SODIUM CHLORIDE 0.9 % IV SOLN: INTRAVENOUS | Status: DC | PRN

## 2023-02-27 MED ORDER — FENTANYL CITRATE (PF) 100 MCG/2ML IJ SOLN
100.0000 ug | Freq: Once | INTRAMUSCULAR | Status: AC
  Administered 2023-02-27: 100 ug via INTRAVENOUS

## 2023-02-27 MED ORDER — FENTANYL CITRATE (PF) 100 MCG/2ML IJ SOLN
INTRAMUSCULAR | Status: AC
  Filled 2023-02-27: qty 2

## 2023-02-27 MED ORDER — CEFAZOLIN SODIUM-DEXTROSE 2-4 GM/100ML-% IV SOLN
2.0000 g | INTRAVENOUS | Status: AC
Start: 2023-02-27 — End: 2023-02-27
  Administered 2023-02-27: 2 g via INTRAVENOUS

## 2023-02-27 MED ORDER — PROPOFOL 500 MG/50ML IV EMUL
INTRAVENOUS | Status: DC | PRN
  Administered 2023-02-27: 75 ug/kg/min via INTRAVENOUS

## 2023-02-27 SURGICAL SUPPLY — 54 items
BIT DRILL 2.7X50 (DRILL) IMPLANT
BLADE SURG 15 STRL LF DISP TIS (BLADE) ×2 IMPLANT
BNDG COHESIVE 4X5 TAN STRL LF (GAUZE/BANDAGES/DRESSINGS) ×1 IMPLANT
BNDG ELASTIC 4INX 5YD STR LF (GAUZE/BANDAGES/DRESSINGS) ×2 IMPLANT
BNDG ESMARK 4X9 LF (GAUZE/BANDAGES/DRESSINGS) ×1 IMPLANT
BNDG GAUZE DERMACEA FLUFF 4 (GAUZE/BANDAGES/DRESSINGS) ×1 IMPLANT
CHLORAPREP W/TINT 26 (MISCELLANEOUS) ×1 IMPLANT
CORD BIPOLAR FORCEPS 12FT (ELECTRODE) ×1 IMPLANT
COVER BACK TABLE 60X90IN (DRAPES) ×1 IMPLANT
CUFF TOURN SGL QUICK 18X4 (TOURNIQUET CUFF) ×1 IMPLANT
CUFF TRNQT CYL 24X4X16.5-23 (TOURNIQUET CUFF) IMPLANT
DRAPE HAND 75INX146IN 110IN (DRAPES) ×1 IMPLANT
DRAPE OEC MINIVIEW 54X84 (DRAPES) ×1 IMPLANT
DRAPE SURG 17X23 STRL (DRAPES) ×1 IMPLANT
DRILL 2.7X50 (DRILL) ×1 IMPLANT
DRIVER QUICK CONNECT T10 (MISCELLANEOUS) IMPLANT
GAUZE PAD ABD 8X10 STRL (GAUZE/BANDAGES/DRESSINGS) ×1 IMPLANT
GAUZE SPONGE 4X4 12PLY STRL (GAUZE/BANDAGES/DRESSINGS) ×1 IMPLANT
GAUZE XEROFORM 1X8 LF (GAUZE/BANDAGES/DRESSINGS) ×1 IMPLANT
GLOVE BIO SURGEON STRL SZ7.5 (GLOVE) ×2 IMPLANT
GLOVE BIOGEL PI IND STRL 7.5 (GLOVE) ×2 IMPLANT
GOWN STRL REUS W/ TWL LRG LVL3 (GOWN DISPOSABLE) ×2 IMPLANT
GOWN STRL SURGICAL XL XLNG (GOWN DISPOSABLE) ×2 IMPLANT
MANIFOLD NEPTUNE II (INSTRUMENTS) ×1 IMPLANT
NDL HYPO 25X1 1.5 SAFETY (NEEDLE) IMPLANT
NDL HYPO 25X5/8 SAFETYGLIDE (NEEDLE) IMPLANT
NEEDLE HYPO 25X1 1.5 SAFETY (NEEDLE) IMPLANT
NEEDLE HYPO 25X5/8 SAFETYGLIDE (NEEDLE) IMPLANT
NS IRRIG 1000ML POUR BTL (IV SOLUTION) IMPLANT
PACK BASIN DAY SURGERY FS (CUSTOM PROCEDURE TRAY) ×1 IMPLANT
PAD CAST 4YDX4 CTTN HI CHSV (CAST SUPPLIES) ×2 IMPLANT
PLATE MIDSHAFT ULNA 127 (Plate) IMPLANT
PLATE MIDSHAFT ULNA 127MM (Plate) ×1 IMPLANT
SCREW CORT NLOCK PA 3.5X16 (Screw) IMPLANT
SCREW GEMINUS PANL 3.5X18 (Screw) IMPLANT
SHEET MEDIUM DRAPE 40X70 STRL (DRAPES) ×1 IMPLANT
SLEEVE SCD COMPRESS KNEE MED (STOCKING) IMPLANT
SLING ARM FOAM STRAP LRG (SOFTGOODS) IMPLANT
SPIKE FLUID TRANSFER (MISCELLANEOUS) IMPLANT
SPLINT PLASTER CAST XFAST 4X15 (CAST SUPPLIES) ×10 IMPLANT
SPONGE T-LAP 4X18 ~~LOC~~+RFID (SPONGE) ×1 IMPLANT
STOCKINETTE IMPERVIOUS 9X36 MD (GAUZE/BANDAGES/DRESSINGS) ×1 IMPLANT
SUCTION TUBE FRAZIER 10FR DISP (SUCTIONS) ×1 IMPLANT
SUT ETHILON 4 0 PS 2 18 (SUTURE) ×1 IMPLANT
SUT MNCRL AB 3-0 PS2 27 (SUTURE) ×1 IMPLANT
SUT VIC AB 4-0 PS2 18 (SUTURE) IMPLANT
SUT VICRYL 0 CT-2 (SUTURE) IMPLANT
SYR BULB EAR ULCER 3OZ GRN STR (SYRINGE) ×2 IMPLANT
SYR CONTROL 10ML LL (SYRINGE) IMPLANT
TOWEL GREEN STERILE FF (TOWEL DISPOSABLE) ×2 IMPLANT
TUBE CONNECTING 20X1/4 (TUBING) ×1 IMPLANT
UNDERPAD 30X36 HEAVY ABSORB (UNDERPADS AND DIAPERS) ×1 IMPLANT
WIRE FIX 1.5 STANDARD TIP (WIRE) ×2 IMPLANT
WIRE FIX 1.5 STD TIP (WIRE) IMPLANT

## 2023-02-27 NOTE — Interval H&P Note (Signed)
 History and Physical Interval Note:  02/27/2023 6:38 AM  Thomas [name]  has presented today for surgery, with the diagnosis of RIGHT ULNA NONUNION.  The various methods of treatment have been discussed with the patient and family. After consideration of risks, benefits and other options for treatment, the patient has consented to  Procedure(s): RIGHT OPEN REDUCTION INTERNAL FIXATION (ORIF) ULNAR FRACTURE (Right) as a surgical intervention.  The patient's history has been reviewed, patient examined, no change in status, stable for surgery.  I have reviewed the patient's chart and labs.  Questions were answered to the patient's satisfaction.     Butler Vegh

## 2023-02-27 NOTE — Op Note (Signed)
 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   ASSISTANT: Joesph Dinsmore, OPA   ANESTHESIA:  Regional with sedation   INTRAVENOUS FLUIDS:  Per anesthesia flow sheet.   ESTIMATED BLOOD LOSS:  Minimal.   COMPLICATIONS:  None.   SPECIMENS:  none   TOURNIQUET TIME:    Total Tourniquet Time Documented: Upper Arm (Right) - 63 minutes Total: Upper Arm (Right) - 63 minutes    DISPOSITION:  Stable to PACU.   INDICATIONS: 62 year old male who had previously undergone right forearm ulnar shaft osteotomy with shortening status post prior distal radius malunion, subsequently developed nonunion at the ulnar osteotomy site with instability at the DRUJ.  He had undergone workup both with laboratory values and intraoperative cultures which were negative for infection.  At this juncture, he was indicated for right forearm ulnar shaft nonunion repair with hardware fixation and pinning of the distal radial ulnar joint for stability.  Risks and benefits of surgery were discussed including the risks of infection, bleeding, scarring, stiffness, nerve injury, vascular injury, tendon injury, need for subsequent operation, , nonunion, malunion.  He voiced understanding of these risks and elected to proceed.  OPERATIVE COURSE: Patient was seen and identified in the preoperative area and marked appropriately.  Surgical consent had been signed. Preoperative IV antibiotic prophylaxis was given. He  was transferred to the operating room and placed in supine position with the Right upper extremity on an arm board.  Sedation was induced by the anesthesiologist. A regional block had been performed by anesthesia in preoperative holding.    Right upper extremity was prepped and draped in normal sterile orthopedic fashion.  A surgical pause was performed between the surgeons, anesthesia, and operating room staff and all were in agreement as to the patient, procedure, and site of procedure.  Tourniquet was placed and padded appropriately to the right upper arm.  The right arm was placed into the traction tower for positioning purposes, no significant traction was utilized.  The arm was exsanguinated and the tourniquet was inflated to 250 mmHg.  The previous longitudinal incision over the ulnar shaft was opened bluntly, and extend both proximally and distally.  Incision was carried down utilizing a 15 blade, blunt dissection was performed down, interval between the ECU and FCU musculature was once again utilized to expose the ulnar shaft.  Fibrous tissue at the nonunion site was carefully debrided sharply and removed with rongeur.  Mobility of the distal portion of the ulnar shaft was appreciated as well as ongoing instability at the distal radioulnar joint.  Once the nonunion site had been appropriately debrided and mobilized, 8 hole skeletal dynamics ulnar shaft plate was selected and temporarily fixed to bone using bone clamps.  Biplanar fluoroscopy was used to confirm appropriate plate positioning, plate was shown on biplanar fluoroscopy as well to achieve appropriate purchase distal and proximal to the osteotomy site in order to regain appropriate stabilization.  Free fix screw was then placed in the proximal portion of the of the plate, proximal to the osteotomy site  in the eccentric portion of the hole, not fully seated.  We then placed a free fix screw in the distal portion of the plate, distal to the  osteotomy site once again and the eccentric portion of the hole.  These were then sequentially tightened to achieve compression technique across the osteotomy site.  Excellent purchase of the screw proximally was noted with a nonlocking screw.  After appropriate compression was achieved, the distal holes were filled with locking screws in bicortical fashion x 3.  Proximal screw holes were filled with bicortical nonlocking screws with excellent purchase.  Appropriate stability of the ulnar shaft was appreciated with gentle manipulation.  At this juncture, fracture reduction of the distal radius was performed, 0.062 K wire was then placed from the ulnar to the radius at the level of the distal radial ulnar joint in order to achieve appropriate stability.  DRUJ was pinned with the forearm in a neutral position for comfort.  Final biplanar fluoroscopic films were taken to confirm appropriate pin placement at the DRUJ as well as stability of the ulnar shaft plating and fixation of the nonunion site.  Appropriate static fixation had been achieved at the nonunion site after the compression technique.  Tourniquet was then deflated and bipolar electrocautery was used for hemostasis.  Copious irrigation was performed followed by layered closure utilizing 2-0 Vicryl for the subcutaneous tissue and 4-0 nylon for the skin surface.  Sugar-tong splint using plaster was then applied after sterile dressings have been applied.  The wire across the distal radial ulnar joint was bent and cut appropriately as well.  Patient is instructed to remain in the immobilization until postoperative follow-up in 2 weeks.  Sling was placed.  Patient was subsequent awoken from anesthesia and transported the postop recovery unit in stable condition.    Shariya Gaster, MD Electronically signed, 02/27/23

## 2023-02-27 NOTE — Anesthesia Procedure Notes (Signed)
 Anesthesia Regional Block: Supraclavicular block   Pre-Anesthetic Checklist: , timeout performed,  Correct Patient, Correct Site, Correct Laterality,  Correct Procedure, Correct Position, site marked,  Risks and benefits discussed,  Surgical consent,  Pre-op evaluation,  At surgeon's request and post-op pain management  Laterality: Upper and Right  Prep: chloraprep       Needles:  Injection technique: Single-shot  Needle Type: Echogenic Needle     Needle Length: 5cm  Needle Gauge: 21     Additional Needles:   Procedures:,,,, ultrasound used (permanent image in chart),,     Nerve Stimulator or Paresthesia:   Additional Responses:  Block tested.  Patient tolerated procedure well Narrative:  Start time: 02/27/2023 7:13 AM End time: 02/27/2023 7:22 AM Injection made incrementally with aspirations every 5 mL.  Performed by: Personally  Anesthesiologist: Jefm Garnette LABOR, MD  Additional Notes: Block tested. Patient tolerated procedure well.

## 2023-02-27 NOTE — Discharge Instructions (Addendum)
 Hand Surgery Postop Instructions   Dressings: Maintain postoperative dressing until orthopedic follow-up.  Keep operative site clean and dry until orthopedic follow-up.  Wound Care: Keep your hand elevated above the level of your heart.  Do not allow it to dangle by your side. Moving your fingers is advised to stimulate circulation but will depend on the site of your surgery.  If you have a splint applied, your doctor will advise you regarding movement.  Activity: Do not drive or operate machinery until clearance given from physician. No heavy lifting with operative extremity.  Diet:  Drink liquids today or eat a light diet.  You may resume a regular diet tomorrow.    General expectations: Take prescribed medication if given, transition to over-the-counter medication as quickly as possible. Fingers may become slightly swollen.  Call your doctor if any of the following occur: Severe pain not relieved by pain medication. Elevated temperature. Dressing soaked with blood. Inability to move fingers. White or bluish color to fingers.   Per [facility] clinic policy, our goal is ensure optimal postoperative pain control with a multimodal pain management strategy. For all [facility] patients, our goal is to wean post-operative narcotic medications by 6 weeks post-operatively. If this is not possible due to utilization of pain medication prior to surgery, your [facility] doctor will support your acute post-operative pain control for the first 6 weeks postoperatively, with a plan to transition you back to your primary pain team following that. Maralee will work to ensure a therapist, occupational.  Anshul Afton Alderton, M.D. Hand Surgery [facility]   Post Anesthesia Home Care Instructions  Activity: Get plenty of rest for the remainder of the day. A responsible individual must stay with you for 24 hours following the procedure.  For the next 24 hours, DO NOT: -Drive a  car -Advertising copywriter -Drink alcoholic beverages -Take any medication unless instructed by your physician -Make any legal decisions or sign important papers.  Meals: Start with liquid foods such as gelatin or soup. Progress to regular foods as tolerated. Avoid greasy, spicy, heavy foods. If nausea and/or vomiting occur, drink only clear liquids until the nausea and/or vomiting subsides. Call your physician if vomiting continues.  Special Instructions/Symptoms: Your throat may feel dry or sore from the anesthesia or the breathing tube placed in your throat during surgery. If this causes discomfort, gargle with warm salt water. The discomfort should disappear within 24 hours.  If you had a scopolamine patch placed behind your ear for the management of post- operative nausea and/or vomiting:  1. The medication in the patch is effective for 72 hours, after which it should be removed.  Wrap patch in a tissue and discard in the trash. Wash hands thoroughly with soap and water. 2. You may remove the patch earlier than 72 hours if you experience unpleasant side effects which may include dry mouth, dizziness or visual disturbances. 3. Avoid touching the patch. Wash your hands with soap and water after contact with the patch.    Regional Anesthesia Blocks  1. You may not be able to move or feel the blocked extremity after a regional anesthetic block. This may last may last from 3-48 hours after placement, but it will go away. The length of time depends on the medication injected and your individual response to the medication. As the nerves start to wake up, you may experience tingling as the movement and feeling returns to your extremity. If the numbness and inability to move your  extremity has not gone away after 48 hours, please call your surgeon.   2. The extremity that is blocked will need to be protected until the numbness is gone and the strength has returned. Because you cannot feel it, you  will need to take extra care to avoid injury. Because it may be weak, you may have difficulty moving it or using it. You may not know what position it is in without looking at it while the block is in effect.  3. For blocks in the legs and feet, returning to weight bearing and walking needs to be done carefully. You will need to wait until the numbness is entirely gone and the strength has returned. You should be able to move your leg and foot normally before you try and bear weight or walk. You will need someone to be with you when you first try to ensure you do not fall and possibly risk injury.  4. Bruising and tenderness at the needle site are common side effects and will resolve in a few days.  5. Persistent numbness or new problems with movement should be communicated to the surgeon or the [facility] [id number] [facility] [phone]).  May have Tylenol  today after 2:32 PM

## 2023-02-27 NOTE — Progress Notes (Signed)
Assisted Dr. Richardson Landry with right, supraclavicular, ultrasound guided block. Side rails up, monitors on throughout procedure. See vital signs in flow sheet. Tolerated Procedure well.

## 2023-02-27 NOTE — Anesthesia Postprocedure Evaluation (Signed)
 Anesthesia Post Note  Patient: [name] [facility]  Procedure(s) Performed: RIGHT OPEN REDUCTION INTERNAL FIXATION (ORIF) ULNAR FRACTURE (Right: Wrist)     Patient location during evaluation: PACU Anesthesia Type: Regional Level of consciousness: awake and alert Pain management: pain level controlled Vital Signs Assessment: post-procedure vital signs reviewed and stable Respiratory status: spontaneous breathing, nonlabored ventilation, respiratory function stable and patient connected to nasal cannula oxygen Cardiovascular status: stable and blood pressure returned to baseline Postop Assessment: no apparent nausea or vomiting Anesthetic complications: no   No notable events documented.  Last Vitals:  Vitals:   02/27/23 1024 02/27/23 1041  BP:  (!) 145/61  Pulse: (!) 102 (!) 104  Resp: (!) 22 18  Temp:  (!) 36.4 C  SpO2: 95% 95%    Last Pain:  Vitals:   02/27/23 1041  TempSrc:   PainSc: 0-No pain                 Garnette DELENA Gab

## 2023-02-27 NOTE — Transfer of Care (Signed)
 Immediate Anesthesia Transfer of Care Note  Patient: [name]  Procedure(s) Performed: RIGHT OPEN REDUCTION INTERNAL FIXATION (ORIF) ULNAR FRACTURE (Right: Wrist)  Patient Location: PACU  Anesthesia Type:MAC and Regional  Level of Consciousness: sedated  Airway & Oxygen Therapy: Patient Spontanous Breathing and Patient connected to face mask oxygen  Post-op Assessment: Report given to RN and Post -op Vital signs reviewed and stable  Post vital signs: Reviewed and stable  Last Vitals:  Vitals Value Taken Time  BP 169/72 02/27/23 0946  Temp    Pulse 117 02/27/23 0949  Resp 29 02/27/23 0949  SpO2 98 % 02/27/23 0949  Vitals shown include unfiled device data.  Last Pain:  Vitals:   02/27/23 0645  TempSrc: Temporal  PainSc: 4          Complications: No notable events documented.

## 2023-03-01 ENCOUNTER — Encounter (HOSPITAL_BASED_OUTPATIENT_CLINIC_OR_DEPARTMENT_OTHER): Payer: Self-pay | Admitting: Orthopedic Surgery

## 2023-03-07 NOTE — Telephone Encounter (Signed)
 error

## 2023-03-08 ENCOUNTER — Inpatient Hospital Stay (HOSPITAL_COMMUNITY)
Admission: EM | Admit: 2023-03-08 | Discharge: 2023-03-26 | DRG: 212 | Disposition: A | Discharge status: Home Health | Payer: 59 | Attending: Thoracic Surgery (Cardiothoracic Vascular Surgery) | Admitting: Thoracic Surgery (Cardiothoracic Vascular Surgery)

## 2023-03-08 ENCOUNTER — Other Ambulatory Visit: Payer: Self-pay

## 2023-03-08 ENCOUNTER — Emergency Department (HOSPITAL_COMMUNITY): Payer: 59

## 2023-03-08 ENCOUNTER — Encounter (HOSPITAL_COMMUNITY): Payer: Self-pay | Admitting: Emergency Medicine

## 2023-03-08 DIAGNOSIS — I472 Ventricular tachycardia, unspecified: Secondary | ICD-10-CM | POA: Diagnosis not present

## 2023-03-08 DIAGNOSIS — I2510 Atherosclerotic heart disease of native coronary artery without angina pectoris: Secondary | ICD-10-CM | POA: Diagnosis present

## 2023-03-08 DIAGNOSIS — K219 Gastro-esophageal reflux disease without esophagitis: Secondary | ICD-10-CM | POA: Diagnosis present

## 2023-03-08 DIAGNOSIS — I340 Nonrheumatic mitral (valve) insufficiency: Secondary | ICD-10-CM | POA: Diagnosis not present

## 2023-03-08 DIAGNOSIS — I509 Heart failure, unspecified: Secondary | ICD-10-CM | POA: Insufficient documentation

## 2023-03-08 DIAGNOSIS — D638 Anemia in other chronic diseases classified elsewhere: Secondary | ICD-10-CM | POA: Diagnosis present

## 2023-03-08 DIAGNOSIS — M25531 Pain in right wrist: Secondary | ICD-10-CM | POA: Diagnosis present

## 2023-03-08 DIAGNOSIS — F1010 Alcohol abuse, uncomplicated: Secondary | ICD-10-CM | POA: Diagnosis present

## 2023-03-08 DIAGNOSIS — D759 Disease of blood and blood-forming organs, unspecified: Secondary | ICD-10-CM | POA: Diagnosis present

## 2023-03-08 DIAGNOSIS — J9811 Atelectasis: Secondary | ICD-10-CM | POA: Diagnosis present

## 2023-03-08 DIAGNOSIS — Z01810 Encounter for preprocedural cardiovascular examination: Secondary | ICD-10-CM | POA: Diagnosis not present

## 2023-03-08 DIAGNOSIS — D696 Thrombocytopenia, unspecified: Secondary | ICD-10-CM | POA: Diagnosis not present

## 2023-03-08 DIAGNOSIS — I4892 Unspecified atrial flutter: Secondary | ICD-10-CM | POA: Diagnosis present

## 2023-03-08 DIAGNOSIS — E876 Hypokalemia: Secondary | ICD-10-CM | POA: Diagnosis present

## 2023-03-08 DIAGNOSIS — I480 Paroxysmal atrial fibrillation: Secondary | ICD-10-CM | POA: Diagnosis present

## 2023-03-08 DIAGNOSIS — I110 Hypertensive heart disease with heart failure: Secondary | ICD-10-CM | POA: Diagnosis present

## 2023-03-08 DIAGNOSIS — Z885 Allergy status to narcotic agent status: Secondary | ICD-10-CM

## 2023-03-08 DIAGNOSIS — R0602 Shortness of breath: Secondary | ICD-10-CM | POA: Diagnosis present

## 2023-03-08 DIAGNOSIS — R7881 Bacteremia: Secondary | ICD-10-CM | POA: Diagnosis not present

## 2023-03-08 DIAGNOSIS — Z79899 Other long term (current) drug therapy: Secondary | ICD-10-CM

## 2023-03-08 DIAGNOSIS — I959 Hypotension, unspecified: Secondary | ICD-10-CM | POA: Diagnosis not present

## 2023-03-08 DIAGNOSIS — I350 Nonrheumatic aortic (valve) stenosis: Secondary | ICD-10-CM | POA: Diagnosis not present

## 2023-03-08 DIAGNOSIS — I080 Rheumatic disorders of both mitral and aortic valves: Secondary | ICD-10-CM | POA: Diagnosis not present

## 2023-03-08 DIAGNOSIS — Z1152 Encounter for screening for COVID-19: Secondary | ICD-10-CM

## 2023-03-08 DIAGNOSIS — I5020 Unspecified systolic (congestive) heart failure: Principal | ICD-10-CM

## 2023-03-08 DIAGNOSIS — F1020 Alcohol dependence, uncomplicated: Secondary | ICD-10-CM | POA: Diagnosis not present

## 2023-03-08 DIAGNOSIS — I493 Ventricular premature depolarization: Secondary | ICD-10-CM | POA: Diagnosis not present

## 2023-03-08 DIAGNOSIS — R509 Fever, unspecified: Secondary | ICD-10-CM | POA: Diagnosis not present

## 2023-03-08 DIAGNOSIS — I5031 Acute diastolic (congestive) heart failure: Secondary | ICD-10-CM | POA: Diagnosis not present

## 2023-03-08 DIAGNOSIS — E872 Acidosis, unspecified: Secondary | ICD-10-CM | POA: Diagnosis present

## 2023-03-08 DIAGNOSIS — G9340 Encephalopathy, unspecified: Secondary | ICD-10-CM

## 2023-03-08 DIAGNOSIS — S52201G Unspecified fracture of shaft of right ulna, subsequent encounter for closed fracture with delayed healing: Secondary | ICD-10-CM | POA: Diagnosis not present

## 2023-03-08 DIAGNOSIS — D62 Acute posthemorrhagic anemia: Secondary | ICD-10-CM | POA: Diagnosis not present

## 2023-03-08 DIAGNOSIS — I10 Essential (primary) hypertension: Secondary | ICD-10-CM | POA: Diagnosis not present

## 2023-03-08 DIAGNOSIS — T402X5A Adverse effect of other opioids, initial encounter: Secondary | ICD-10-CM | POA: Diagnosis not present

## 2023-03-08 DIAGNOSIS — R791 Abnormal coagulation profile: Secondary | ICD-10-CM | POA: Diagnosis present

## 2023-03-08 DIAGNOSIS — Y92230 Patient room in hospital as the place of occurrence of the external cause: Secondary | ICD-10-CM | POA: Diagnosis not present

## 2023-03-08 DIAGNOSIS — G9349 Other encephalopathy: Secondary | ICD-10-CM | POA: Diagnosis not present

## 2023-03-08 DIAGNOSIS — R21 Rash and other nonspecific skin eruption: Secondary | ICD-10-CM | POA: Diagnosis not present

## 2023-03-08 DIAGNOSIS — Z952 Presence of prosthetic heart valve: Principal | ICD-10-CM

## 2023-03-08 DIAGNOSIS — T886XXA Anaphylactic reaction due to adverse effect of correct drug or medicament properly administered, initial encounter: Secondary | ICD-10-CM | POA: Diagnosis not present

## 2023-03-08 DIAGNOSIS — I339 Acute and subacute endocarditis, unspecified: Secondary | ICD-10-CM | POA: Diagnosis not present

## 2023-03-08 DIAGNOSIS — I4430 Unspecified atrioventricular block: Secondary | ICD-10-CM | POA: Diagnosis not present

## 2023-03-08 DIAGNOSIS — D61818 Other pancytopenia: Secondary | ICD-10-CM | POA: Diagnosis present

## 2023-03-08 DIAGNOSIS — Z905 Acquired absence of kidney: Secondary | ICD-10-CM

## 2023-03-08 DIAGNOSIS — I351 Nonrheumatic aortic (valve) insufficiency: Secondary | ICD-10-CM | POA: Diagnosis not present

## 2023-03-08 DIAGNOSIS — I2489 Other forms of acute ischemic heart disease: Secondary | ICD-10-CM | POA: Diagnosis present

## 2023-03-08 DIAGNOSIS — F419 Anxiety disorder, unspecified: Secondary | ICD-10-CM | POA: Diagnosis present

## 2023-03-08 DIAGNOSIS — I330 Acute and subacute infective endocarditis: Secondary | ICD-10-CM | POA: Diagnosis not present

## 2023-03-08 DIAGNOSIS — I442 Atrioventricular block, complete: Secondary | ICD-10-CM | POA: Diagnosis not present

## 2023-03-08 DIAGNOSIS — B955 Unspecified streptococcus as the cause of diseases classified elsewhere: Secondary | ICD-10-CM | POA: Diagnosis present

## 2023-03-08 DIAGNOSIS — I5033 Acute on chronic diastolic (congestive) heart failure: Secondary | ICD-10-CM

## 2023-03-08 DIAGNOSIS — Z87442 Personal history of urinary calculi: Secondary | ICD-10-CM

## 2023-03-08 DIAGNOSIS — I059 Rheumatic mitral valve disease, unspecified: Secondary | ICD-10-CM | POA: Diagnosis not present

## 2023-03-08 LAB — COMPREHENSIVE METABOLIC PANEL
ALT: 34 U/L (ref 0–44)
AST: 79 U/L — ABNORMAL HIGH (ref 15–41)
Albumin: 2.5 g/dL — ABNORMAL LOW (ref 3.5–5.0)
Alkaline Phosphatase: 96 U/L (ref 38–126)
Anion gap: 8 (ref 5–15)
BUN: 8 mg/dL (ref 8–23)
CO2: 22 mmol/L (ref 22–32)
Calcium: 8.4 mg/dL — ABNORMAL LOW (ref 8.9–10.3)
Chloride: 105 mmol/L (ref 98–111)
Creatinine, Ser: 0.68 mg/dL (ref 0.61–1.24)
GFR, Estimated: >60 mL/min (ref >60)
Glucose, Bld: 101 mg/dL — ABNORMAL HIGH (ref 70–99)
Potassium: 3.2 mmol/L — ABNORMAL LOW (ref 3.5–5.1)
Sodium: 135 mmol/L (ref 135–145)
Total Bilirubin: 0.8 mg/dL (ref 0.0–1.2)
Total Protein: 7 g/dL (ref 6.5–8.1)

## 2023-03-08 LAB — PREPARE RBC (CROSSMATCH)

## 2023-03-08 LAB — RETICULOCYTES
Immature Retic Fract: 16.8 % — ABNORMAL HIGH (ref 2.3–15.9)
RBC.: 2.34 MIL/uL — ABNORMAL LOW (ref 4.22–5.81)
Retic Count, Absolute: 42.1 10*3/uL (ref 19.0–186.0)
Retic Ct Pct: 1.8 % (ref 0.4–3.1)

## 2023-03-08 LAB — RESP PANEL BY RT-PCR (RSV, FLU A&B, COVID)  RVPGX2
Influenza A by PCR: NEGATIVE
Influenza B by PCR: NEGATIVE
Resp Syncytial Virus by PCR: NEGATIVE
SARS Coronavirus 2 by RT PCR: NEGATIVE

## 2023-03-08 LAB — CBC WITH DIFFERENTIAL/PLATELET
Abs Immature Granulocytes: 0.01 10*3/uL (ref 0.00–0.07)
Basophils Absolute: 0 10*3/uL (ref 0.0–0.1)
Basophils Relative: 1 %
Eosinophils Absolute: 0.2 10*3/uL (ref 0.0–0.5)
Eosinophils Relative: 4 %
HCT: 23.3 % — ABNORMAL LOW (ref 39.0–52.0)
Hemoglobin: 7.2 g/dL — ABNORMAL LOW (ref 13.0–17.0)
Immature Granulocytes: 0 %
Lymphocytes Relative: 19 %
Lymphs Abs: 0.7 10*3/uL (ref 0.7–4.0)
MCH: 28.7 pg (ref 26.0–34.0)
MCHC: 30.9 g/dL (ref 30.0–36.0)
MCV: 92.8 fL (ref 80.0–100.0)
Monocytes Absolute: 0.7 10*3/uL (ref 0.1–1.0)
Monocytes Relative: 19 %
Neutro Abs: 2 10*3/uL (ref 1.7–7.7)
Neutrophils Relative %: 57 %
Platelets: 146 10*3/uL — ABNORMAL LOW (ref 150–400)
RBC: 2.51 MIL/uL — ABNORMAL LOW (ref 4.22–5.81)
RDW: 16.5 % — ABNORMAL HIGH (ref 11.5–15.5)
WBC: 3.5 10*3/uL — ABNORMAL LOW (ref 4.0–10.5)
nRBC: 0 % (ref 0.0–0.2)

## 2023-03-08 LAB — ABO/RH: ABO/RH(D): O NEG

## 2023-03-08 LAB — FOLATE: Folate: 17.6 ng/mL (ref >5.9)

## 2023-03-08 LAB — IRON AND TIBC
Iron: 18 ug/dL — ABNORMAL LOW (ref 45–182)
Saturation Ratios: 4 % — ABNORMAL LOW (ref 17.9–39.5)
TIBC: 455 ug/dL — ABNORMAL HIGH (ref 250–450)
UIBC: 437 ug/dL

## 2023-03-08 LAB — TROPONIN I (HIGH SENSITIVITY)
Troponin I (High Sensitivity): 27 ng/L — ABNORMAL HIGH (ref <18)
Troponin I (High Sensitivity): 27 ng/L — ABNORMAL HIGH (ref <18)

## 2023-03-08 LAB — BRAIN NATRIURETIC PEPTIDE: B Natriuretic Peptide: 211 pg/mL — ABNORMAL HIGH (ref 0.0–100.0)

## 2023-03-08 LAB — TSH: TSH: 3.267 u[IU]/mL (ref 0.350–4.500)

## 2023-03-08 LAB — VITAMIN B12: Vitamin B-12: 381 pg/mL (ref 180–914)

## 2023-03-08 LAB — POC OCCULT BLOOD, ED: Fecal Occult Bld: NEGATIVE

## 2023-03-08 LAB — PROCALCITONIN: Procalcitonin: <0.1 ng/mL

## 2023-03-08 LAB — FERRITIN: Ferritin: 43 ng/mL (ref 24–336)

## 2023-03-08 MED ORDER — HEPARIN SODIUM (PORCINE) 5000 UNIT/ML IJ SOLN: 5000.0000 [IU] | Freq: Three times a day (TID) | INTRAMUSCULAR | Status: DC

## 2023-03-08 MED ORDER — THIAMINE HCL 100 MG/ML IJ SOLN
100.0000 mg | Freq: Every day | INTRAMUSCULAR | Status: DC
  Filled 2023-03-08 (×4): qty 2

## 2023-03-08 MED ORDER — SODIUM CHLORIDE 0.9% FLUSH
3.0000 mL | Freq: Two times a day (BID) | INTRAVENOUS | Status: DC
  Administered 2023-03-08 – 2023-03-09 (×2): 3 mL via INTRAVENOUS

## 2023-03-08 MED ORDER — HEPARIN SODIUM (PORCINE) 5000 UNIT/ML IJ SOLN
5000.0000 [IU] | Freq: Three times a day (TID) | INTRAMUSCULAR | Status: DC
  Administered 2023-03-08 – 2023-03-11 (×8): 5000 [IU] via SUBCUTANEOUS
  Filled 2023-03-08 (×8): qty 1

## 2023-03-08 MED ORDER — POTASSIUM CHLORIDE CRYS ER 20 MEQ PO TBCR
40.0000 meq | EXTENDED_RELEASE_TABLET | Freq: Once | ORAL | Status: AC
  Administered 2023-03-08: 40 meq via ORAL
  Filled 2023-03-08: qty 2

## 2023-03-08 MED ORDER — SODIUM CHLORIDE 0.9% IV SOLUTION
Freq: Once | INTRAVENOUS | Status: AC
Start: 2023-03-08 — End: 2023-03-08

## 2023-03-08 MED ORDER — ALPRAZOLAM 0.5 MG PO TABS
1.0000 mg | ORAL_TABLET | Freq: Every evening | ORAL | Status: DC | PRN
  Administered 2023-03-10 (×2): 1 mg via ORAL
  Filled 2023-03-08: qty 1
  Filled 2023-03-08: qty 2

## 2023-03-08 MED ORDER — DIPHENHYDRAMINE HCL 50 MG/ML IJ SOLN
25.0000 mg | Freq: Once | INTRAMUSCULAR | Status: AC
  Administered 2023-03-08: 25 mg via INTRAVENOUS
  Filled 2023-03-08: qty 1

## 2023-03-08 MED ORDER — FOLIC ACID 1 MG PO TABS
1.0000 mg | ORAL_TABLET | Freq: Every day | ORAL | Status: DC
  Administered 2023-03-08 – 2023-03-26 (×17): 1 mg via ORAL
  Filled 2023-03-08 (×18): qty 1

## 2023-03-08 MED ORDER — DIPHENHYDRAMINE HCL 25 MG PO CAPS
25.0000 mg | ORAL_CAPSULE | Freq: Once | ORAL | Status: AC
  Administered 2023-03-08: 25 mg via ORAL
  Filled 2023-03-08: qty 1

## 2023-03-08 MED ORDER — ADULT MULTIVITAMIN W/MINERALS CH
1.0000 | ORAL_TABLET | Freq: Every day | ORAL | Status: DC
  Administered 2023-03-08 – 2023-03-15 (×8): 1 via ORAL
  Filled 2023-03-08 (×8): qty 1

## 2023-03-08 MED ORDER — SODIUM CHLORIDE 0.9% FLUSH
3.0000 mL | INTRAVENOUS | Status: DC | PRN
Start: 2023-03-08 — End: 2023-03-09

## 2023-03-08 MED ORDER — FUROSEMIDE 10 MG/ML IJ SOLN
40.0000 mg | Freq: Two times a day (BID) | INTRAMUSCULAR | Status: DC
  Administered 2023-03-08 – 2023-03-09 (×2): 40 mg via INTRAVENOUS
  Filled 2023-03-08 (×2): qty 4

## 2023-03-08 MED ORDER — LORAZEPAM 1 MG PO TABS
1.0000 mg | ORAL_TABLET | ORAL | Status: DC | PRN
  Administered 2023-03-09 (×2): 1 mg via ORAL
  Filled 2023-03-08 (×2): qty 1

## 2023-03-08 MED ORDER — ACETAMINOPHEN 325 MG PO TABS
650.0000 mg | ORAL_TABLET | ORAL | Status: DC | PRN
  Administered 2023-03-08 – 2023-03-10 (×2): 650 mg via ORAL
  Filled 2023-03-08 (×2): qty 2

## 2023-03-08 MED ORDER — IOHEXOL 350 MG/ML SOLN
75.0000 mL | Freq: Once | INTRAVENOUS | Status: AC | PRN
  Administered 2023-03-08: 75 mL via INTRAVENOUS

## 2023-03-08 MED ORDER — SODIUM CHLORIDE 0.9 % IV BOLUS
1000.0000 mL | Freq: Once | INTRAVENOUS | Status: AC
  Administered 2023-03-08: 1000 mL via INTRAVENOUS

## 2023-03-08 MED ORDER — FUROSEMIDE 10 MG/ML IJ SOLN
40.0000 mg | Freq: Once | INTRAMUSCULAR | Status: AC
  Administered 2023-03-08: 40 mg via INTRAVENOUS
  Filled 2023-03-08: qty 4

## 2023-03-08 MED ORDER — LORAZEPAM 2 MG/ML IJ SOLN: 1.0000 mg | INTRAMUSCULAR | Status: DC | PRN

## 2023-03-08 MED ORDER — PANTOPRAZOLE SODIUM 40 MG IV SOLR
40.0000 mg | Freq: Once | INTRAVENOUS | Status: AC
  Administered 2023-03-08: 40 mg via INTRAVENOUS
  Filled 2023-03-08: qty 10

## 2023-03-08 MED ORDER — SODIUM CHLORIDE 0.9 % IV SOLN: 250.0000 mL | INTRAVENOUS | Status: DC | PRN

## 2023-03-08 MED ORDER — THIAMINE MONONITRATE 100 MG PO TABS
100.0000 mg | ORAL_TABLET | Freq: Every day | ORAL | Status: DC
  Administered 2023-03-08 – 2023-03-26 (×18): 100 mg via ORAL
  Filled 2023-03-08 (×18): qty 1

## 2023-03-08 MED ORDER — ONDANSETRON HCL 4 MG/2ML IJ SOLN
4.0000 mg | Freq: Once | INTRAMUSCULAR | Status: AC
  Administered 2023-03-08: 4 mg via INTRAVENOUS
  Filled 2023-03-08: qty 2

## 2023-03-08 MED ORDER — ONDANSETRON HCL 4 MG/2ML IJ SOLN: 4.0000 mg | Freq: Four times a day (QID) | INTRAMUSCULAR | Status: DC | PRN

## 2023-03-08 NOTE — ED Notes (Signed)
 Pt tolerating blood transfusion well, titrated blood transfusion rate from 120 to 150 / hr

## 2023-03-08 NOTE — ED Notes (Signed)
 R arm incision appears to be swollen, no drainage noted

## 2023-03-08 NOTE — ED Triage Notes (Signed)
 Pt states he had a surgery to right arm on 11/30 with placing of pins. Pt states the site has been bleeding some and he is concerned for infection. Denies fevers. States he had one episode of n/v/d earlier this morning.

## 2023-03-08 NOTE — ED Provider Notes (Signed)
 [place] EMERGENCY DEPARTMENT AT [facility] Provider Note   CSN: [id number] Arrival date & time: 03/08/23  9344     History  Chief Complaint  Patient presents with   Wound Infection    Thomas [name] is a 63 y.o. male.  Patient has a history of hypertension and GERD and recent arm surgery.  Patient complains of weakness and coughing up blood  The history is provided by the patient and medical records.  Weakness Severity:  Moderate Onset quality:  Sudden Timing:  Constant Progression:  Worsening Chronicity:  New Context: not alcohol use   Relieved by:  Nothing Worsened by:  Nothing Ineffective treatments:  None tried Associated symptoms: cough   Associated symptoms: no abdominal pain, no chest pain, no diarrhea, no frequency, no headaches and no seizures        Home Medications Prior to Admission medications   Medication Sig Start Date End Date Taking? Authorizing Provider  acetaminophen  (TYLENOL ) 650 MG CR tablet Take 1,300 mg by mouth every 8 (eight) hours as needed for pain.   Yes [provider]  ALPRAZolam  (XANAX ) 1 MG tablet Take 1 mg by mouth at bedtime as needed for anxiety.   Yes [provider]  ibuprofen  (ADVIL ) 800 MG tablet Take 1 tablet (800 mg total) by mouth 3 (three) times daily. Take with food Patient taking differently: Take 200 mg by mouth every 8 (eight) hours as needed for fever, headache or mild pain (pain score 1-3). Take with food 05/09/19  Yes Avegno, Komlanvi S, FNP  olmesartan (BENICAR) 40 MG tablet Take 40 mg by mouth daily.   Yes [provider]  omeprazole (PRILOSEC OTC) 20 MG tablet Take 20 mg by mouth daily.   Yes [provider]  oxyCODONE  (ROXICODONE ) 5 MG immediate release tablet Take 1 tablet (5 mg total) by mouth every 6 (six) hours as needed. Patient not taking: Reported on 03/08/2023 02/26/23   Agarwala, Gildardo, MD  DULoxetine (CYMBALTA) 60 MG capsule Take 60 mg by mouth  daily. Patient not taking: Reported on 03/08/2023    [provider]      Allergies    Patient has no known allergies.    Review of Systems   Review of Systems  Constitutional:  Negative for appetite change and fatigue.  HENT:  Negative for congestion, ear discharge and sinus pressure.   Eyes:  Negative for discharge.  Respiratory:  Positive for cough.   Cardiovascular:  Negative for chest pain.  Gastrointestinal:  Negative for abdominal pain and diarrhea.  Genitourinary:  Negative for frequency and hematuria.  Musculoskeletal:  Negative for back pain.  Skin:  Negative for rash.  Neurological:  Positive for weakness. Negative for seizures and headaches.  Psychiatric/Behavioral:  Negative for hallucinations.     Physical Exam Updated Vital Signs BP (!) 165/79   Pulse (!) 118   Temp 98.6 F (37 C) (Oral)   Resp (!) 24   Ht 5' 11 (1.803 m)   Wt 82 kg   SpO2 96%   BMI 25.21 kg/m  Physical Exam Vitals and nursing note reviewed.  Constitutional:      Appearance: He is well-developed.  HENT:     Head: Normocephalic.     Nose: Nose normal.  Eyes:     General: No scleral icterus.    Conjunctiva/sclera: Conjunctivae normal.  Neck:     Thyroid: No thyromegaly.  Cardiovascular:     Rate and Rhythm: Normal rate and regular rhythm.  Heart sounds: No murmur heard.    No friction rub. No gallop.  Pulmonary:     Breath sounds: No stridor. No wheezing or rales.  Chest:     Chest wall: No tenderness.  Abdominal:     General: There is no distension.     Tenderness: There is no abdominal tenderness. There is no rebound.  Musculoskeletal:        General: Normal range of motion.     Cervical back: Neck supple.  Lymphadenopathy:     Cervical: No cervical adenopathy.  Skin:    Findings: No erythema or rash.  Neurological:     Mental Status: He is alert and oriented to person, place, and time.     Motor: No abnormal muscle tone.     Coordination: Coordination  normal.  Psychiatric:        Behavior: Behavior normal.     ED Results / Procedures / Treatments   Labs (all labs ordered are listed, but only abnormal results are displayed) Labs Reviewed  CBC WITH DIFFERENTIAL/PLATELET - Abnormal; Notable for the following components:      Result Value   WBC 3.5 (*)    RBC 2.51 (*)    Hemoglobin 7.2 (*)    HCT 23.3 (*)    RDW 16.5 (*)    Platelets 146 (*)    All other components within normal limits  COMPREHENSIVE METABOLIC PANEL - Abnormal; Notable for the following components:   Potassium 3.2 (*)    Glucose, Bld 101 (*)    Calcium  8.4 (*)    Albumin  2.5 (*)    AST 79 (*)    All other components within normal limits  BRAIN NATRIURETIC PEPTIDE - Abnormal; Notable for the following components:   B Natriuretic Peptide 211.0 (*)    All other components within normal limits  IRON AND TIBC - Abnormal; Notable for the following components:   Iron 18 (*)    TIBC 455 (*)    Saturation Ratios 4 (*)    All other components within normal limits  RETICULOCYTES - Abnormal; Notable for the following components:   RBC. 2.34 (*)    Immature Retic Fract 16.8 (*)    All other components within normal limits  TROPONIN I (HIGH SENSITIVITY) - Abnormal; Notable for the following components:   Troponin I (High Sensitivity) 27 (*)    All other components within normal limits  RESP PANEL BY RT-PCR (RSV, FLU A&B, COVID)  RVPGX2  VITAMIN B12  FERRITIN  FOLATE  POC OCCULT BLOOD, ED  TROPONIN I (HIGH SENSITIVITY)    EKG EKG Interpretation Date/Time:  Thursday March 08 2023 10:29:44 EST Ventricular Rate:  112 PR Interval:  136 QRS Duration:  86 QT Interval:  362 QTC Calculation: 495 R Axis:   72  Text Interpretation: Sinus tachycardia Probable left atrial enlargement Borderline prolonged QT interval Confirmed by Suzette Pac [phone]) on 03/08/2023 10:53:19 AM  Radiology CT Angio Chest PE W and/or Wo Contrast Result Date: 03/08/2023 CLINICAL DATA:   Recent surgery. Cough. Concern for pulmonary embolism. EXAM: CT ANGIOGRAPHY CHEST WITH CONTRAST TECHNIQUE: Multidetector CT imaging of the chest was performed using the standard protocol during bolus administration of intravenous contrast. Multiplanar CT image reconstructions and MIPs were obtained to evaluate the vascular anatomy. RADIATION DOSE REDUCTION: This exam was performed according to the departmental dose-optimization program which includes automated exposure control, adjustment of the mA and/or kV according to patient size and/or use of iterative reconstruction technique. CONTRAST:  75mL OMNIPAQUE  IOHEXOL  350 MG/ML SOLN COMPARISON:  Chest radiograph dated 03/08/2023. FINDINGS: Evaluation of this exam is limited due to respiratory motion. Cardiovascular: There is no cardiomegaly or pericardial effusion. There is coronary vascular calcification. Mild atherosclerotic calcification of the thoracic aorta. No aneurysmal dilatation or dissection. The origins of the great vessels of the aortic arch appear patent. Evaluation of the pulmonary arteries is limited due to severe respiratory motion and suboptimal opacification and timing of the contrast. No large or central pulmonary artery embolus identified. Mediastinum/Nodes: No hilar or mediastinal adenopathy. The esophagus is grossly unremarkable. No mediastinal fluid collection. Lungs/Pleura: Moderate bilateral pleural effusions with partial compressive atelectasis of the lower lobes. There is diffuse interstitial and interlobular septal prominence suggestive of edema. Areas of airspace ground-glass density in the right upper lobe may represent edema, although pneumonia is not excluded. No pneumothorax. The central airways are patent. Upper Abdomen: Partially visualized left moderate hydronephrosis versus parapelvic cyst. CT abdomen pelvis or renal ultrasound may provide better evaluation. Musculoskeletal: No acute osseous pathology. Review of the MIP images  confirms the above findings. IMPRESSION: 1. No CT evidence of large or central pulmonary artery embolus. 2. Findings of CHF with moderate bilateral pleural effusions. Superimposed pneumonia is not excluded. 3. Partially visualized left moderate hydronephrosis versus parapelvic cyst. CT abdomen pelvis or renal ultrasound may provide better evaluation. 4.  Aortic Atherosclerosis (ICD10-I70.0). Electronically Signed   By: Vanetta Chou M.D.   On: 03/08/2023 12:03   DG Chest Port 1 View Result Date: 03/08/2023 CLINICAL DATA:  Cough. EXAM: PORTABLE CHEST 1 VIEW COMPARISON:  12/20/2021. FINDINGS: There are increased interstitial markings with lower lobe predominance and several Kerley B-lines, favoring congestive heart failure/pulmonary edema. Bilateral lung fields are otherwise clear. Bilateral costophrenic angles are clear. Normal cardio-mediastinal silhouette, considering AP technique. No acute osseous abnormalities. The soft tissues are within normal limits. IMPRESSION: *Findings favor mild congestive heart failure/pulmonary edema. Electronically Signed   By: Ree Molt M.D.   On: 03/08/2023 08:13    Procedures Procedures    Medications Ordered in ED Medications  sodium chloride  0.9 % bolus 1,000 mL (1,000 mLs Intravenous Bolus 03/08/23 0819)  ondansetron  (ZOFRAN ) injection 4 mg (4 mg Intravenous Given 03/08/23 0819)  pantoprazole  (PROTONIX ) injection 40 mg (40 mg Intravenous Given 03/08/23 0819)  diphenhydrAMINE  (BENADRYL ) injection 25 mg (25 mg Intravenous Given 03/08/23 1023)  furosemide  (LASIX ) injection 40 mg (40 mg Intravenous Given 03/08/23 1121)  potassium chloride  SA (KLOR-CON  M) CR tablet 40 mEq (40 mEq Oral Given 03/08/23 1121)  iohexol  (OMNIPAQUE ) 350 MG/ML injection 75 mL (75 mLs Intravenous Contrast Given 03/08/23 1152)    ED Course/ Medical Decision Making/ A&P  Patient with congestive heart failure.  Cardiology was notified and stated they would consult on the patient if the hospitalist  feels like is necessary                               Medical Decision Making Amount and/or Complexity of Data Reviewed Labs: ordered. Radiology: ordered. ECG/medicine tests: ordered.  Risk Prescription drug management. Decision regarding hospitalization.  This patient presents to the ED for concern of coughing up blood, this involves an extensive number of treatment options, and is a complaint that carries with it a high risk of complications and morbidity.  The differential diagnosis includes hemoptysis, heart failure   Co morbidities that complicate the patient evaluation  GERD and hypertension   Additional history obtained:  Additional history obtained from patient External records from outside source obtained and reviewed including hospital records   Lab Tests:  I Ordered, and personally interpreted labs.  The pertinent results include: BNP 211   Imaging Studies ordered:  I ordered imaging studies including chest x-ray I independently visualized and interpreted imaging which showed congestive heart failure I agree with the radiologist interpretation   Cardiac Monitoring: / EKG:  The patient was maintained on a cardiac monitor.  I personally viewed and interpreted the cardiac monitored which showed an underlying rhythm of: Normal sinus rhythm   Consultations Obtained:  I requested consultation with the [facility] list,  and discussed lab and imaging findings as well as pertinent plan - they recommend: Admit for heart failure and diuresis   Problem List / ED Course / Critical interventions / Medication management  GERD and heart failure I ordered medication including Lasix  for heart failure Reevaluation of the patient after these medicines showed that the patient stayed the same I have reviewed the patients home medicines and have made adjustments as needed   Social Determinants of Health:  None   Test / Admission - Considered:  None  Patient with new  onset congestive heart failure.  He will be admitted to medicine.        Final Clinical Impression(s) / ED Diagnoses Final diagnoses:  Systolic congestive heart failure, unspecified HF chronicity (HCC)    Rx / DC Orders ED Discharge Orders     None         Suzette Pac, MD 03/12/23 1538

## 2023-03-08 NOTE — H&P (Signed)
 History and Physical    Patient: Thomas [name] FMW:[id number] DOB: 1960/03/10 DOA: 03/08/2023 DOS: the patient was seen and examined on 03/08/2023 PCP: Marvine Rush, MD  Patient coming from: Home  Chief Complaint:  Chief Complaint  Patient presents with   Wound Infection   HPI: Thomas [name] is a 63 year old male with a history of anxiety, alcohol dependence, hypertension, thrombocytopenia, and left nephrectomy secondary to a kidney stone complication presenting with at least 1 week history of fatigue, generalized weakness, and shortness of breath.  The patient's significant other is at bedside to supplement the history.  The patient states that he has had a largely nonproductive cough and shortness of breath for over a month.  He states that occasionally he has some white frothy material with his cough.  He denies any hemoptysis.  Over the past week, he has noted worsening shortness of breath and increasing lower extremity edema and worsening fatigue.  He has had some subjective fevers and chills, but when he checks his temperature at home he is not febrile.  He had 1 episode of nausea and vomiting prior to coming to emergency department, but otherwise has not had any hematemesis, hematochezia, melena, diarrhea, abdominal pain.  He denies any dysuria or hematuria. The patient states that he drinks on a daily basis.  He drinks about a 12 pack of beer with occasional liquor.  He has been drinking for over 10 years.  He denies any illicit drug use.  He denies any over-the-counter medications or NSAIDs. 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  He denies any drainage or worsening erythema or edema at the surgical site.  He has a follow-up appointment with Dr. Arlinda on 03/14/2023. In the ED, the patient was afebrile  hemodynamically stable with oxygen saturation 96% room air.  WBC 3.5, hemoglobin 7.2, platelets 146.  Sodium 135, potassium 3.2, bicarbonate 22, serum creatinine 0.68.  AST 79, ALT 34, alk phosphatase 96, total bilirubin 0.8.  BNP 211.  Troponin 27.  Chest x-ray showed increased vascular markings and interstitial markings.  CTA chest was negative for PE.  It showed bilateral pleural effusions with diffuse interstitial and interlobular septal thickening.  FOBT was negative.  The patient was started on IV furosemide .  He was admitted for further evaluation and treatment of his fluid overload.  Review of Systems: As mentioned in the history of present illness. All other systems reviewed and are negative. Past Medical History:  Diagnosis Date   Blood dyscrasia    GERD (gastroesophageal reflux disease)    History of kidney stones    Hypertension    Thrombocytopenia (HCC) 04/12/2016   Past Surgical History:  Procedure Laterality Date   BONE BIOPSY Right 02/09/2023   Procedure: BONE BIOPSY;  Surgeon: Arlinda Buster, MD;  Location: [facility];  Service: Orthopedics;  Laterality: Right;   HARDWARE REMOVAL Right 02/09/2023   Procedure: RIGHT FOREARM HARDWARE REMOVAL;  Surgeon: Arlinda Buster, MD;  Location: [facility];  Service: Orthopedics;  Laterality: Right;   IRRIGATION AND DEBRIDEMENT ELBOW Right 02/09/2023   Procedure: RIGHT FOREARM IRRIGATION AND DEBRIDEMENT;  Surgeon: Arlinda Buster, MD;  Location: [facility];  Service: Orthopedics;  Laterality: Right;   ORIF ULNAR FRACTURE Right 02/27/2023   Procedure: RIGHT OPEN REDUCTION INTERNAL FIXATION (ORIF) ULNAR FRACTURE;  Surgeon:  Arlinda Buster, MD;  Location: [facility];  Service: Orthopedics;  Laterality: Right;   renalectomy Left    WRIST OSTEOTOMY Right 10/24/2022   Procedure: RIGHT WRIST ULNAR SHORTENING OSTEOTOMY;  Surgeon: Arlinda Buster, MD;  Location: [facility];  Service: Orthopedics;  Laterality: Right;  regional block   Social History:  reports that he has never smoked. He has never used smokeless tobacco. He reports current alcohol use. He reports that he does not use drugs.  No Known Allergies  Family History  Problem Relation Age of Onset   Healthy Mother    Cancer Father     Prior to Admission medications   Medication Sig Start Date End Date Taking? Authorizing Provider  acetaminophen  (TYLENOL ) 650 MG CR tablet Take 1,300 mg by mouth every 8 (eight) hours as needed for pain.   Yes [provider]  ALPRAZolam  (XANAX ) 1 MG tablet Take 1 mg by mouth at bedtime as needed for anxiety.   Yes [provider]  ibuprofen  (ADVIL ) 800 MG tablet Take 1 tablet (800 mg total) by mouth 3 (three) times daily. Take with food Patient taking differently: Take 200 mg by mouth every 8 (eight) hours as needed for fever, headache or mild pain (pain score 1-3). Take with food 05/09/19  Yes Avegno, Komlanvi S, FNP  olmesartan (BENICAR) 40 MG tablet Take 40 mg by mouth daily.   Yes [provider]  omeprazole (PRILOSEC OTC) 20 MG tablet Take 20 mg by mouth daily.   Yes [provider]  oxyCODONE  (ROXICODONE ) 5 MG immediate release tablet Take 1 tablet (5 mg total) by mouth every 6 (six) hours as needed. Patient not taking: Reported on 03/08/2023 02/26/23   Agarwala, Buster, MD  DULoxetine (CYMBALTA) 60 MG capsule Take 60 mg by mouth daily. Patient not taking: Reported on 03/08/2023    [provider]    Physical Exam: Vitals:   03/08/23 1115 03/08/23 1130 03/08/23 1131 03/08/23 1245  BP: (!) 147/78 (!) 165/79  139/72  Pulse: (!) 114 (!) 118  (!) 111  Resp: (!) 32 (!) 24    Temp:   98.6 F (37 C)   TempSrc:   Oral   SpO2: 93% 96%  95%  Weight:      Height:       GENERAL:  A&O x 3, NAD, well developed, cooperative, follows commands HEENT: /AT, No thrush, No icterus, No oral ulcers Neck:  No neck mass, No  meningismus, soft, supple CV: RRR, no S3, no S4, no rub,+ JVD Lungs:  bilateral crackles.  No wheez Abd: soft/NT +BS, nondistended Ext: 2 + LE edema, no lymphangitis, no cyanosis, no rashes Neuro:  CN II-XII intact, strength 4/5 in RUE, RLE, strength 4/5 LUE, LLE; sensation intact bilateral; no dysmetria; babinski equivocal  Data Reviewed: Data reviewed above in history  Assessment and Plan: Acute CHF, type unspecified -Work up in progress -Start IV furosemide  -Accurate I's and O's -Daily weights -ReDS  -Echo -CTA chest negative for PE -COVID-19 PCR negative  Symptomatic anemia/pancytopenia -FOBT negative in the ED -Patient also was FOBT negative on 01/08/2023 -Suspected degree of myelosuppression from chronic alcohol use -B12--381 -Iron saturation 4 -Ferritin 43 -folate -TSH -Type and screen and transfuse 1 unit PRBC  Elevated troponin -Not consistent with ACS -Secondary to demand ischemia -Troponin trend is flat 27>> 27 -Echocardiogram  Hypokalemia -Replete -Check magnesium   Alcohol abuse -Last alcoholic beverage 03/07/2023 -Alcohol withdrawal protocol -UDS  Essential hypertension -Patient has  not been taking his olmesartan consistently -Anticipate improvement with diuresis  Solitary kidney -Patient is status post left-sided nephrectomy for complications related to her renal stone when he was a child -Monitor renal function with diuresis    Advance Care Planning: full code  Consults: none  Family Communication: significant other updated 1/9  Severity of Illness: The appropriate patient status for this patient is INPATIENT. Inpatient status is judged to be reasonable and necessary in order to provide the required intensity of service to ensure the patient's safety. The patient's presenting symptoms, physical exam findings, and initial radiographic and laboratory data in the context of their chronic comorbidities is felt to place them at high risk for  further clinical deterioration. Furthermore, it is not anticipated that the patient will be medically stable for discharge from the hospital within 2 midnights of admission.   * I certify that at the point of admission it is my clinical judgment that the patient will require inpatient hospital care spanning beyond 2 midnights from the point of admission due to high intensity of service, high risk for further deterioration and high frequency of surveillance required.*  Author: Alm Schneider, MD 03/08/2023 1:05 PM  For on call review [email or web address].

## 2023-03-08 NOTE — ED Notes (Signed)
 Verbal order by MD to stop 0.9% NaCl fluid bolus, bolus stopped

## 2023-03-08 NOTE — ED Notes (Signed)
 Blood consent signed

## 2023-03-08 NOTE — Hospital Course (Addendum)
 Thomas [name] was admitted to the hospital with the working diagnosis of heart failure decompensation.   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 In the ED, the patient was afebrile hemodynamically stable with oxygen saturation 96% room air.  WBC 3.5, hemoglobin 7.2, platelets 146.  Sodium 135, potassium 3.2, bicarbonate 22, serum creatinine 0.68.  AST 79, ALT 34, alk phosphatase 96, total bilirubin 0.8.  BNP 211.  Troponin 27.  Chest x-ray showed increased vascular markings and interstitial markings.  CTA chest was negative for PE.  It showed bilateral pleural effusions with diffuse interstitial and interlobular septal thickening.   The patient was started on IV furosemide .  He was admitted for further evaluation and treatment of his fluid overload.  Echocardiogram with aortic valve vegetation.   01/11 transfer to [facility] for further care.  01/13 TEE with severe AI with aortic valve vegetation and severe MR with mitral valve vegetation, likely perforation and flail segment.  01/14 cardiac catheterization in preparation for valvular intervention on Friday.

## 2023-03-08 NOTE — ED Notes (Signed)
 Pt requesting sprite, per MD, pt may have something to drink--pt given sprite

## 2023-03-09 ENCOUNTER — Inpatient Hospital Stay (HOSPITAL_COMMUNITY): Payer: 59

## 2023-03-09 ENCOUNTER — Other Ambulatory Visit (HOSPITAL_COMMUNITY): Payer: Self-pay | Admitting: *Deleted

## 2023-03-09 DIAGNOSIS — I5031 Acute diastolic (congestive) heart failure: Secondary | ICD-10-CM | POA: Diagnosis not present

## 2023-03-09 DIAGNOSIS — D696 Thrombocytopenia, unspecified: Secondary | ICD-10-CM | POA: Diagnosis not present

## 2023-03-09 DIAGNOSIS — D61818 Other pancytopenia: Secondary | ICD-10-CM | POA: Diagnosis not present

## 2023-03-09 DIAGNOSIS — I509 Heart failure, unspecified: Secondary | ICD-10-CM | POA: Diagnosis not present

## 2023-03-09 LAB — BASIC METABOLIC PANEL
Anion gap: 6 (ref 5–15)
BUN: 11 mg/dL (ref 8–23)
CO2: 25 mmol/L (ref 22–32)
Calcium: 8.2 mg/dL — ABNORMAL LOW (ref 8.9–10.3)
Chloride: 103 mmol/L (ref 98–111)
Creatinine, Ser: 0.77 mg/dL (ref 0.61–1.24)
GFR, Estimated: >60 mL/min (ref >60)
Glucose, Bld: 111 mg/dL — ABNORMAL HIGH (ref 70–99)
Potassium: 2.9 mmol/L — ABNORMAL LOW (ref 3.5–5.1)
Sodium: 134 mmol/L — ABNORMAL LOW (ref 135–145)

## 2023-03-09 LAB — BPAM RBC
Blood Product Expiration Date: 202501222359
Blood Product Expiration Date: 202501292359
ISSUE DATE / TIME: 202501091621
ISSUE DATE / TIME: 202501091933
Unit Type and Rh: 9500
Unit Type and Rh: 9500

## 2023-03-09 LAB — ECHOCARDIOGRAM COMPLETE
AR max vel: 2.24 cm2
AV Area VTI: 2.07 cm2
AV Area mean vel: 2.25 cm2
AV Mean grad: 13 mm[Hg]
AV Peak grad: 25 mm[Hg]
Ao pk vel: 2.5 m/s
Area-P 1/2: 3.31 cm2
Est EF: >75
Height: 71 in
MV M vel: 4.07 m/s
MV Peak grad: 66.3 mm[Hg]
P 1/2 time: 130 ms
S' Lateral: 2.5 cm
Weight: 2776.03 [oz_av]

## 2023-03-09 LAB — TYPE AND SCREEN
ABO/RH(D): O NEG
Antibody Screen: NEGATIVE
Unit division: 0
Unit division: 0

## 2023-03-09 LAB — CBC
HCT: 28 % — ABNORMAL LOW (ref 39.0–52.0)
Hemoglobin: 9 g/dL — ABNORMAL LOW (ref 13.0–17.0)
MCH: 28 pg (ref 26.0–34.0)
MCHC: 32.1 g/dL (ref 30.0–36.0)
MCV: 87.2 fL (ref 80.0–100.0)
Platelets: 142 10*3/uL — ABNORMAL LOW (ref 150–400)
RBC: 3.21 MIL/uL — ABNORMAL LOW (ref 4.22–5.81)
RDW: 17.2 % — ABNORMAL HIGH (ref 11.5–15.5)
WBC: 4.8 10*3/uL (ref 4.0–10.5)
nRBC: 0 % (ref 0.0–0.2)

## 2023-03-09 LAB — MAGNESIUM: Magnesium: 1.7 mg/dL (ref 1.7–2.4)

## 2023-03-09 LAB — HIV ANTIBODY (ROUTINE TESTING W REFLEX): HIV Screen 4th Generation wRfx: NONREACTIVE

## 2023-03-09 LAB — T4, FREE: Free T4: 0.88 ng/dL (ref 0.61–1.12)

## 2023-03-09 MED ORDER — POTASSIUM CHLORIDE CRYS ER 20 MEQ PO TBCR
40.0000 meq | EXTENDED_RELEASE_TABLET | Freq: Four times a day (QID) | ORAL | Status: AC
  Administered 2023-03-09 (×2): 40 meq via ORAL
  Filled 2023-03-09 (×2): qty 2

## 2023-03-09 MED ORDER — DIPHENHYDRAMINE HCL 25 MG PO CAPS
25.0000 mg | ORAL_CAPSULE | Freq: Every evening | ORAL | Status: DC | PRN
  Administered 2023-03-09 – 2023-03-11 (×4): 25 mg via ORAL
  Filled 2023-03-09 (×4): qty 1

## 2023-03-09 MED ORDER — FUROSEMIDE 10 MG/ML IJ SOLN
40.0000 mg | Freq: Every day | INTRAMUSCULAR | Status: DC
  Administered 2023-03-10 – 2023-03-11 (×2): 40 mg via INTRAVENOUS
  Filled 2023-03-09 (×2): qty 4

## 2023-03-09 MED ORDER — MAGNESIUM SULFATE 2 GM/50ML IV SOLN
2.0000 g | Freq: Once | INTRAVENOUS | Status: AC
  Administered 2023-03-09: 2 g via INTRAVENOUS
  Filled 2023-03-09: qty 50

## 2023-03-09 NOTE — Progress Notes (Signed)
*  PRELIMINARY RESULTS* Echocardiogram 2D Echocardiogram has been performed.  Stacey Drain 03/09/2023, 2:43 PM

## 2023-03-09 NOTE — Progress Notes (Signed)
 PROGRESS NOTE  [name] [facility] FMW:[id number] DOB: 1960-11-10 DOA: 03/08/2023 PCP: Marvine Rush, MD  Brief History:  63 year old male with a history of anxiety, alcohol dependence, hypertension, thrombocytopenia, and left nephrectomy secondary to a kidney stone complication presenting with at least 1 week history of fatigue, generalized weakness, and shortness of breath.  The patient's significant other is at bedside to supplement the history.  The patient states that he has had a largely nonproductive cough and shortness of breath for over a month.  He states that occasionally he has some white frothy material with his cough.  He denies any hemoptysis.  Over the past week, he has noted worsening shortness of breath and increasing lower extremity edema and worsening fatigue.  He has had some subjective fevers and chills, but when he checks his temperature at home he is not febrile.  He had 1 episode of nausea and vomiting prior to coming to emergency department, but otherwise has not had any hematemesis, hematochezia, melena, diarrhea, abdominal pain.  He denies any dysuria or hematuria. The patient states that he drinks on a daily basis.  He drinks about a 12 pack of beer with occasional liquor.  He has been drinking for over 10 years.  He denies any illicit drug use.  He denies any over-the-counter medications or NSAIDs. 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  He denies any drainage or worsening erythema or edema at the surgical site.  He has a follow-up appointment with Dr. Arlinda on 03/14/2023. In the ED, the patient was afebrile hemodynamically stable with oxygen saturation 96% room air.  WBC 3.5, hemoglobin 7.2, platelets 146.  Sodium 135, potassium 3.2, bicarbonate 22, serum creatinine 0.68.  AST 79, ALT 34,  alk phosphatase 96, total bilirubin 0.8.  BNP 211.  Troponin 27.  Chest x-ray showed increased vascular markings and interstitial markings.  CTA chest was negative for PE.  It showed bilateral pleural effusions with diffuse interstitial and interlobular septal thickening.  FOBT was negative.  The patient was started on IV furosemide .  He was admitted for further evaluation and treatment of his fluid overload.   Assessment/Plan: Acute CHF, type unspecified -Work up in progress -Continue IV furosemide  -Accurate I's and O's--NEG 1.6L -Daily weights--question accuracy -ReDS 27 -1/10 Echo -CTA chest negative for PE -COVID-19 PCR negative   Symptomatic anemia/pancytopenia -FOBT negative in the ED -Patient also was FOBT negative on 01/08/2023 -Suspected degree of myelosuppression from chronic alcohol use -B12--381 -Iron saturation 4 -Ferritin 43 -folate 17.6 -TSH--3.267 -Type and screen and transfused 2 unit PRBC   Elevated troponin -Not consistent with ACS -Secondary to demand ischemia -Troponin trend is flat 27>> 27 -1/10 Echo   Hypokalemia -Replete -Check magnesium  1.7   Alcohol abuse -Last alcoholic beverage 03/07/2023 -Alcohol withdrawal protocol -no active withdrawal presently   Essential hypertension -Patient has not been taking his olmesartan consistently -Anticipate improvement with diuresis   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  Denies f/c, cp, sob, n/v/d  Objective: Vitals:   03/09/23 0420 03/09/23 0500 03/09/23 0918 03/09/23 1352  BP: 132/68  132/68 (!) 128/59  Pulse: 93  93 93  Resp:  20     Temp: 98.5 F (36.9 C)     TempSrc: Oral     SpO2: 96%   95%  Weight:  78.7 kg    Height:        Intake/Output Summary (Last 24 hours) at 03/09/2023 1726 Last data filed at 03/09/2023 1400 Gross per 24 hour  Intake 2161 ml  Output 3100 ml  Net -939 ml   Weight change:  Exam:  General:  Pt is alert, follows commands appropriately, not in acute distress HEENT: No icterus, No thrush, No neck mass, /AT Cardiovascular: RRR, S1/S2, no rubs, no gallops Respiratory: fine bibasilar crackles.  No wheeze Abdomen: Soft/+BS, non tender, non distended, no guarding Extremities: 1 + LE edema, No lymphangitis, No petechiae, No rashes, no synovitis   Data Reviewed: I have personally reviewed following labs and imaging studies Basic Metabolic Panel: Recent Labs  Lab 03/08/23 0811 03/09/23 0412 03/09/23 1032  NA 135  --  134*  K 3.2*  --  2.9*  CL 105  --  103  CO2 22  --  25  GLUCOSE 101*  --  111*  BUN 8  --  11  CREATININE 0.68  --  0.77  CALCIUM  8.4*  --  8.2*  MG  --  1.7  --    Liver Function Tests: Recent Labs  Lab 03/08/23 0811  AST 79*  ALT 34  ALKPHOS 96  BILITOT 0.8  PROT 7.0  ALBUMIN  2.5*   No results for input(s): LIPASE, AMYLASE in the last 168 hours. No results for input(s): AMMONIA in the last 168 hours. Coagulation Profile: No results for input(s): INR, PROTIME in the last 168 hours. CBC: Recent Labs  Lab 03/08/23 0811 03/09/23 1032  WBC 3.5* 4.8  NEUTROABS 2.0  --   HGB 7.2* 9.0*  HCT 23.3* 28.0*  MCV 92.8 87.2  PLT 146* 142*   Cardiac Enzymes: No results for input(s): CKTOTAL, CKMB, CKMBINDEX, TROPONINI in the last 168 hours. BNP: Invalid input(s): POCBNP CBG: No results for input(s): GLUCAP in the last 168 hours. HbA1C: No results for input(s): HGBA1C in the last 72 hours. Urine analysis:    Component Value Date/Time   COLORURINE YELLOW 01/08/2023 1720   APPEARANCEUR CLEAR 01/08/2023 1720   LABSPEC  1.015 01/08/2023 1720   PHURINE 5.0 01/08/2023 1720   GLUCOSEU NEGATIVE 01/08/2023 1720   HGBUR MODERATE (A) 01/08/2023 1720   BILIRUBINUR NEGATIVE 01/08/2023 1720   KETONESUR NEGATIVE 01/08/2023 1720   PROTEINUR NEGATIVE 01/08/2023 1720   UROBILINOGEN 0.2 02/27/2008 1111   NITRITE NEGATIVE 01/08/2023 1720   LEUKOCYTESUR TRACE (A) 01/08/2023 1720   Sepsis Labs: @LABRCNTIP (procalcitonin:4,lacticidven:4) ) Recent Results (from the past 240 hours)  Resp panel by RT-PCR (RSV, Flu A&B, Covid) Anterior Nasal Swab     Status: None   Collection Time: 03/08/23  8:20 AM   Specimen: Anterior Nasal Swab  Result Value Ref Range Status   SARS Coronavirus 2 by RT PCR NEGATIVE NEGATIVE Final    Comment: (NOTE) SARS-CoV-2 target nucleic acids are NOT DETECTED.  The SARS-CoV-2 RNA is generally detectable in upper respiratory specimens during the acute phase of infection. The lowest concentration of SARS-CoV-2 viral copies this assay can detect is 138  copies/mL. A negative result does not preclude SARS-Cov-2 infection and should not be used as the sole basis for treatment or other patient management decisions. A negative result may occur with  improper specimen collection/handling, submission of specimen other than nasopharyngeal swab, presence of viral mutation(s) within the areas targeted by this assay, and inadequate number of viral copies(<138 copies/mL). A negative result must be combined with clinical observations, patient history, and epidemiological information. The expected result is Negative.  Fact Sheet for Patients:  bloggercourse.com  Fact Sheet for Healthcare Providers:  seriousbroker.it  This test is no t yet approved or cleared by the [place]  FDA and  has been authorized for detection and/or diagnosis of SARS-CoV-2 by FDA under an Emergency Use Authorization (EUA). This EUA will remain  in effect (meaning this test can  be used) for the duration of the COVID-19 declaration under Section 564(b)(1) of the Act, 21 U.S.C.section 360bbb-3(b)(1), unless the authorization is terminated  or revoked sooner.       Influenza A by PCR NEGATIVE NEGATIVE Final   Influenza B by PCR NEGATIVE NEGATIVE Final    Comment: (NOTE) The Xpert Xpress SARS-CoV-2/FLU/RSV plus assay is intended as an aid in the diagnosis of influenza from Nasopharyngeal swab specimens and should not be used as a sole basis for treatment. Nasal washings and aspirates are unacceptable for Xpert Xpress SARS-CoV-2/FLU/RSV testing.  Fact Sheet for Patients: bloggercourse.com  Fact Sheet for Healthcare Providers: seriousbroker.it  This test is not yet approved or cleared by the [place]  FDA and has been authorized for detection and/or diagnosis of SARS-CoV-2 by FDA under an Emergency Use Authorization (EUA). This EUA will remain in effect (meaning this test can be used) for the duration of the COVID-19 declaration under Section 564(b)(1) of the Act, 21 U.S.C. section 360bbb-3(b)(1), unless the authorization is terminated or revoked.     Resp Syncytial Virus by PCR NEGATIVE NEGATIVE Final    Comment: (NOTE) Fact Sheet for Patients: bloggercourse.com  Fact Sheet for Healthcare Providers: seriousbroker.it  This test is not yet approved or cleared by the [place]  FDA and has been authorized for detection and/or diagnosis of SARS-CoV-2 by FDA under an Emergency Use Authorization (EUA). This EUA will remain in effect (meaning this test can be used) for the duration of the COVID-19 declaration under Section 564(b)(1) of the Act, 21 U.S.C. section 360bbb-3(b)(1), unless the authorization is terminated or revoked.  Performed at [facility], [address], [place], [place] [address]      Scheduled Meds:  folic acid   1 mg Oral Daily    [START ON 03/10/2023] furosemide   40 mg Intravenous Daily   heparin   5,000 Units Subcutaneous Q8H   multivitamin with minerals  1 tablet Oral Daily   potassium chloride   40 mEq Oral Q6H   thiamine   100 mg Oral Daily   Or   thiamine   100 mg Intravenous Daily   Continuous Infusions:  Procedures/Studies: CT Angio Chest PE W and/or Wo Contrast Result Date: 03/08/2023 CLINICAL DATA:  Recent surgery. Cough. Concern for pulmonary embolism. EXAM: CT ANGIOGRAPHY CHEST WITH CONTRAST TECHNIQUE: Multidetector CT imaging of the chest was performed using the standard protocol during bolus administration of intravenous contrast. Multiplanar CT image reconstructions and MIPs were obtained to evaluate the vascular anatomy. RADIATION DOSE REDUCTION: This exam was performed according to the departmental dose-optimization program which includes automated exposure control, adjustment of the mA and/or kV according to patient size and/or use of iterative reconstruction technique. CONTRAST:  75mL OMNIPAQUE  IOHEXOL  350 MG/ML SOLN COMPARISON:  Chest radiograph dated 03/08/2023. FINDINGS: Evaluation of this exam is limited due to respiratory motion. Cardiovascular: There is no cardiomegaly or pericardial effusion. There is coronary vascular calcification. Mild atherosclerotic calcification of the thoracic aorta. No aneurysmal dilatation or dissection. The origins of the great vessels of the aortic arch appear patent. Evaluation of the pulmonary arteries is limited due to severe respiratory motion and suboptimal opacification and timing of the contrast. No large or central pulmonary artery embolus identified. Mediastinum/Nodes: No hilar or mediastinal adenopathy. The esophagus is grossly unremarkable. No mediastinal fluid collection. Lungs/Pleura: Moderate bilateral pleural effusions with partial compressive atelectasis of the lower lobes. There is diffuse interstitial and interlobular septal prominence suggestive of edema. Areas  of airspace ground-glass density in the right upper lobe may represent edema, although pneumonia is not excluded. No pneumothorax. The central airways are patent. Upper Abdomen: Partially visualized left moderate hydronephrosis versus parapelvic cyst. CT abdomen pelvis or renal ultrasound may provide better evaluation. Musculoskeletal: No acute osseous pathology. Review of the MIP images confirms the above findings. IMPRESSION: 1. No CT evidence of large or central pulmonary artery embolus. 2. Findings of CHF with moderate bilateral pleural effusions. Superimposed pneumonia is not excluded. 3. Partially visualized left moderate hydronephrosis versus parapelvic cyst. CT abdomen pelvis or renal ultrasound may provide better evaluation. 4.  Aortic Atherosclerosis (ICD10-I70.0). Electronically Signed   By: Vanetta Chou M.D.   On: 03/08/2023 12:03   DG Chest Port 1 View Result Date: 03/08/2023 CLINICAL DATA:  Cough. EXAM: PORTABLE CHEST 1 VIEW COMPARISON:  12/20/2021. FINDINGS: There are increased interstitial markings with lower lobe predominance and several Kerley B-lines, favoring congestive heart failure/pulmonary edema. Bilateral lung fields are otherwise clear. Bilateral costophrenic angles are clear. Normal cardio-mediastinal silhouette, considering AP technique. No acute osseous abnormalities. The soft tissues are within normal limits. IMPRESSION: *Findings favor mild congestive heart failure/pulmonary edema. Electronically Signed   By: Ree Molt M.D.   On: 03/08/2023 08:13   DG MINI C-ARM IMAGE ONLY Result Date: 02/27/2023 There is no interpretation for this exam.  This order is for images obtained during a surgical procedure.  Please See Surgeries Tab for more information regarding the procedure.   DG MINI C-ARM IMAGE ONLY Result Date: 02/09/2023 There is no interpretation for this exam.  This order is for images obtained during a surgical procedure.  Please See Surgeries Tab for more  information regarding the procedure.    Alm Schneider, DO  [facility]  If 7PM-7AM, please contact night-coverage [email or web address] Password [email or web address] 03/09/2023, 5:26 PM   LOS: 1 day

## 2023-03-09 NOTE — Progress Notes (Addendum)
   03/09/23 1403  TOC Brief Assessment  Insurance and Status Reviewed  Patient has primary care physician Yes  Home environment has been reviewed Home  Prior level of function: with Spouse  Prior/Current Home Services No current home services  Social Drivers of Health Review SDOH reviewed no interventions necessary  Readmission risk has been reviewed Yes  Transition of care needs no transition of care needs at this time   Substance abuse resources and CHF resources added to AVS.  Transition of Care Department (TOC) has reviewed patient and no TOC needs have been identified at this time. We will continue to monitor patient advancement through interdisciplinary progression rounds. If new patient transition needs arise, please place a

## 2023-03-09 NOTE — Plan of Care (Signed)

## 2023-03-09 NOTE — Progress Notes (Signed)
   03/09/23 0900  ReDS Vest / Clip  Station Marker C  Ruler Value 34  ReDS Value Range < 36  ReDS Actual Value 27

## 2023-03-09 NOTE — Plan of Care (Signed)
   Problem: Education: Goal: Knowledge of General Education information will improve Description Including pain rating scale, medication(s)/side effects and non-pharmacologic comfort measures Outcome: Progressing   Problem: Health Behavior/Discharge Planning: Goal: Ability to manage health-related needs will improve Outcome: Progressing

## 2023-03-10 ENCOUNTER — Inpatient Hospital Stay (HOSPITAL_COMMUNITY): Payer: 59

## 2023-03-10 ENCOUNTER — Encounter (HOSPITAL_COMMUNITY): Payer: Self-pay | Admitting: Internal Medicine

## 2023-03-10 DIAGNOSIS — I330 Acute and subacute infective endocarditis: Principal | ICD-10-CM | POA: Diagnosis present

## 2023-03-10 DIAGNOSIS — D61818 Other pancytopenia: Secondary | ICD-10-CM | POA: Diagnosis not present

## 2023-03-10 DIAGNOSIS — I5031 Acute diastolic (congestive) heart failure: Secondary | ICD-10-CM

## 2023-03-10 LAB — CBC
HCT: 30.6 % — ABNORMAL LOW (ref 39.0–52.0)
Hemoglobin: 9.5 g/dL — ABNORMAL LOW (ref 13.0–17.0)
MCH: 27.8 pg (ref 26.0–34.0)
MCHC: 31 g/dL (ref 30.0–36.0)
MCV: 89.5 fL (ref 80.0–100.0)
Platelets: 146 10*3/uL — ABNORMAL LOW (ref 150–400)
RBC: 3.42 MIL/uL — ABNORMAL LOW (ref 4.22–5.81)
RDW: 17.2 % — ABNORMAL HIGH (ref 11.5–15.5)
WBC: 5.8 10*3/uL (ref 4.0–10.5)
nRBC: 0 % (ref 0.0–0.2)

## 2023-03-10 LAB — BASIC METABOLIC PANEL
Anion gap: 4 — ABNORMAL LOW (ref 5–15)
BUN: 11 mg/dL (ref 8–23)
CO2: 24 mmol/L (ref 22–32)
Calcium: 8.3 mg/dL — ABNORMAL LOW (ref 8.9–10.3)
Chloride: 105 mmol/L (ref 98–111)
Creatinine, Ser: 0.73 mg/dL (ref 0.61–1.24)
GFR, Estimated: >60 mL/min (ref >60)
Glucose, Bld: 108 mg/dL — ABNORMAL HIGH (ref 70–99)
Potassium: 3.6 mmol/L (ref 3.5–5.1)
Sodium: 133 mmol/L — ABNORMAL LOW (ref 135–145)

## 2023-03-10 LAB — MAGNESIUM: Magnesium: 1.8 mg/dL (ref 1.7–2.4)

## 2023-03-10 MED ORDER — MAGNESIUM SULFATE 2 GM/50ML IV SOLN
2.0000 g | Freq: Once | INTRAVENOUS | Status: AC
  Administered 2023-03-10: 2 g via INTRAVENOUS
  Filled 2023-03-10: qty 50

## 2023-03-10 MED ORDER — FENTANYL CITRATE PF 50 MCG/ML IJ SOSY
25.0000 ug | PREFILLED_SYRINGE | INTRAMUSCULAR | Status: DC | PRN
  Administered 2023-03-10 – 2023-03-11 (×7): 25 ug via INTRAVENOUS
  Filled 2023-03-10 (×7): qty 1

## 2023-03-10 MED ORDER — ENSURE ENLIVE PO LIQD
237.0000 mL | Freq: Two times a day (BID) | ORAL | Status: DC
  Administered 2023-03-13 – 2023-03-14 (×3): 237 mL via ORAL

## 2023-03-10 MED ORDER — ENSURE ENLIVE PO LIQD
237.0000 mL | Freq: Two times a day (BID) | ORAL | Status: DC
  Administered 2023-03-11 – 2023-03-15 (×6): 237 mL via ORAL

## 2023-03-10 MED ORDER — POTASSIUM CHLORIDE CRYS ER 20 MEQ PO TBCR
40.0000 meq | EXTENDED_RELEASE_TABLET | Freq: Once | ORAL | Status: AC
  Administered 2023-03-10: 40 meq via ORAL
  Filled 2023-03-10: qty 2

## 2023-03-10 MED ORDER — METHOCARBAMOL 500 MG PO TABS
500.0000 mg | ORAL_TABLET | Freq: Four times a day (QID) | ORAL | Status: DC | PRN
  Administered 2023-03-10 – 2023-03-11 (×4): 500 mg via ORAL
  Filled 2023-03-10 (×4): qty 1

## 2023-03-10 NOTE — Progress Notes (Signed)
   03/10/23 1100  ReDS Vest / Clip  Station Marker C  Ruler Value 34  ReDS Actual Value 33

## 2023-03-10 NOTE — Progress Notes (Signed)
 Pt and fiance updated on bed assignment at [facility]. Advised that we have no ETA on transport, both state understanding.

## 2023-03-10 NOTE — Progress Notes (Signed)
 PROGRESS NOTE  Thomas [name] [facility] FMW:[id number] DOB: Mar 10, 1960 DOA: 03/08/2023 PCP: Marvine Rush, MD  Brief History:  63 year old male with a history of anxiety, alcohol dependence, hypertension, thrombocytopenia, and left nephrectomy secondary to a kidney stone complication presenting with at least 1 week history of fatigue, generalized weakness, and shortness of breath.  The patient's significant other is at bedside to supplement the history.  The patient states that he has had a largely nonproductive cough and shortness of breath for over a month.  He states that occasionally he has some white frothy material with his cough.  He denies any hemoptysis.  Over the past week, he has noted worsening shortness of breath and increasing lower extremity edema and worsening fatigue.  He has had some subjective fevers and chills, but when he checks his temperature at home he is not febrile.  He had 1 episode of nausea and vomiting prior to coming to emergency department, but otherwise has not had any hematemesis, hematochezia, melena, diarrhea, abdominal pain.  He denies any dysuria or hematuria. The patient states that he drinks on a daily basis.  He drinks about a 12 pack of beer with occasional liquor.  He has been drinking for over 10 years.  He denies any illicit drug use.  He denies any over-the-counter medications or NSAIDs. 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  He denies any drainage or worsening erythema or edema at the surgical site.  He has a follow-up appointment with Dr. Arlinda on 03/14/2023. In the ED, the patient was afebrile hemodynamically stable with oxygen saturation 96% room air.  WBC 3.5, hemoglobin 7.2, platelets 146.  Sodium 135, potassium 3.2, bicarbonate 22, serum creatinine 0.68.  AST 79, ALT 34,  alk phosphatase 96, total bilirubin 0.8.  BNP 211.  Troponin 27.  Chest x-ray showed increased vascular markings and interstitial markings.  CTA chest was negative for PE.  It showed bilateral pleural effusions with diffuse interstitial and interlobular septal thickening.  FOBT was negative.  The patient was started on IV furosemide .  He was admitted for further evaluation and treatment of his fluid overload.   Assessment/Plan:  Acute HFpEF -Continue IV furosemide  -Accurate I's and O's--NEG 3.2L -Daily weights--question accuracy -ReDS 27 -1/10 Echo--EF >75%, no WMA, normal RVF, severe AI.  Linear shaped echodensity measuring 2.0 x 0.35 cm attached to coronary cusp of aortic valve -CTA chest negative for PE -COVID-19 PCR negative  Aortic valve vegetation -Concerned about infective endocarditis -Concerned source may be from right ulna surgeries/hardware -notified ID (Dr. Hart consult after transfer to [facility] -consulted cardiology already--will place on schedule for TEE early next week -blood cultures x 2   Symptomatic anemia/pancytopenia -FOBT negative in the ED -Patient also was FOBT negative on 01/08/2023 -Suspected degree of myelosuppression from chronic alcohol use -B12--381 -Iron saturation 4 -Ferritin 43 -folate 17.6 -TSH--3.267 -Type and screen and transfused 2 unit PRBC  Right Ulnar nonunion/Right wrist pain -xray right wrist -discussed with [facility] (Dr. Nyra will see in consult -please page Dr. Agarwala after pt gets to [facility] -place sugar tong splint   Elevated troponin -Not consistent with ACS -Secondary to demand ischemia -Troponin trend is flat 27>> 27 -1/10 Echo--EF >75%, no WMA, normal RVF, severe AI.  Linear shaped echodensity measuring 2.0 x 0.35 cm  attached to coronary cusp of aortic valve   Hypokalemia -Replete -Check magnesium  1.7   Alcohol abuse -Last alcoholic beverage 03/07/2023 -Alcohol withdrawal protocol -no active withdrawal  presently   Essential hypertension -Patient has not been taking his olmesartan consistently -Anticipate improvement with diuresis   Solitary kidney -Patient is status post left-sided nephrectomy for complications related to her renal stone when he was a child -Monitor renal function with diuresis             Family Communication:  significant other 1/11   Consultants: none    Code Status:  FULL    DVT Prophylaxis:  [place] Heparin       Procedures: As Listed in Progress Note Above   Antibiotics: None      Total time spent 50 minutes.  Greater than 50% spent face to face counseling and coordinating care.    Subjective: Pt complains of pain in right wrist.  Denies cp, sob, n/v/d, abd pain  Objective: Vitals:   03/09/23 0918 03/09/23 1352 03/09/23 2034 03/10/23 0500  BP: 132/68 (!) 128/59 (!) 142/82   Pulse: 93 93 92   Resp:   16   Temp:   98 F (36.7 C)   TempSrc:   Oral   SpO2:  95% 98%   Weight:    78.7 kg  Height:        Intake/Output Summary (Last 24 hours) at 03/10/2023 1036 Last data filed at 03/10/2023 0900 Gross per 24 hour  Intake --  Output 1750 ml  Net -1750 ml   Weight change: -3.3 kg Exam:  General:  Pt is alert, follows commands appropriately, not in acute distress HEENT: No icterus, No thrush, No neck mass, [place]/AT Cardiovascular: RRR, S1/S2, no rubs, no gallops Respiratory: CTA bilaterally, no wheezing, no crackles, no rhonchi Abdomen: Soft/+BS, non tender, non distended, no guarding Extremities: No edema, No lymphangitis, No petechiae, No rashes, no synovitis   Data Reviewed: I have personally reviewed following labs and imaging studies Basic Metabolic Panel: Recent Labs  Lab 03/08/23 0811 03/09/23 0412 03/09/23 1032 03/10/23 0358  NA 135  --  134* 133*  K 3.2*  --  2.9* 3.6  CL 105  --  103 105  CO2 22  --  25 24  GLUCOSE 101*  --  111* 108*  BUN 8  --  11 11  CREATININE 0.68  --  0.77 0.73  CALCIUM  8.4*  --  8.2* 8.3*   MG  --  1.7  --  1.8   Liver Function Tests: Recent Labs  Lab 03/08/23 0811  AST 79*  ALT 34  ALKPHOS 96  BILITOT 0.8  PROT 7.0  ALBUMIN  2.5*   No results for input(s): LIPASE, AMYLASE in the last 168 hours. No results for input(s): AMMONIA in the last 168 hours. Coagulation Profile: No results for input(s): INR, PROTIME in the last 168 hours. CBC: Recent Labs  Lab 03/08/23 0811 03/09/23 1032 03/10/23 0358  WBC 3.5* 4.8 5.8  NEUTROABS 2.0  --   --   HGB 7.2* 9.0* 9.5*  HCT 23.3* 28.0* 30.6*  MCV 92.8 87.2 89.5  PLT 146* 142* 146*   Cardiac Enzymes: No results for input(s): CKTOTAL, CKMB, CKMBINDEX, TROPONINI in the last 168 hours. BNP: Invalid input(s): POCBNP CBG: No results for input(s): GLUCAP in the last 168 hours. HbA1C: No results for input(s): HGBA1C in the last 72 hours. Urine analysis:    Component Value Date/Time   COLORURINE YELLOW 01/08/2023 1720  APPEARANCEUR CLEAR 01/08/2023 1720   LABSPEC 1.015 01/08/2023 1720   PHURINE 5.0 01/08/2023 1720   GLUCOSEU NEGATIVE 01/08/2023 1720   HGBUR MODERATE (A) 01/08/2023 1720   BILIRUBINUR NEGATIVE 01/08/2023 1720   KETONESUR NEGATIVE 01/08/2023 1720   PROTEINUR NEGATIVE 01/08/2023 1720   UROBILINOGEN 0.2 02/27/2008 1111   NITRITE NEGATIVE 01/08/2023 1720   LEUKOCYTESUR TRACE (A) 01/08/2023 1720   Sepsis Labs: @LABRCNTIP (procalcitonin:4,lacticidven:4) ) Recent Results (from the past 240 hours)  Resp panel by RT-PCR (RSV, Flu A&B, Covid) Anterior Nasal Swab     Status: None   Collection Time: 03/08/23  8:20 AM   Specimen: Anterior Nasal Swab  Result Value Ref Range Status   SARS Coronavirus 2 by RT PCR NEGATIVE NEGATIVE Final    Comment: (NOTE) SARS-CoV-2 target nucleic acids are NOT DETECTED.  The SARS-CoV-2 RNA is generally detectable in upper respiratory specimens during the acute phase of infection. The lowest concentration of SARS-CoV-2 viral copies this assay can  detect is 138 copies/mL. A negative result does not preclude SARS-Cov-2 infection and should not be used as the sole basis for treatment or other patient management decisions. A negative result may occur with  improper specimen collection/handling, submission of specimen other than nasopharyngeal swab, presence of viral mutation(s) within the areas targeted by this assay, and inadequate number of viral copies(<138 copies/mL). A negative result must be combined with clinical observations, patient history, and epidemiological information. The expected result is Negative.  Fact Sheet for Patients:  bloggercourse.com  Fact Sheet for Healthcare Providers:  seriousbroker.it  This test is no t yet approved or cleared by the [place]  FDA and  has been authorized for detection and/or diagnosis of SARS-CoV-2 by FDA under an Emergency Use Authorization (EUA). This EUA will remain  in effect (meaning this test can be used) for the duration of the COVID-19 declaration under Section 564(b)(1) of the Act, 21 U.S.C.section 360bbb-3(b)(1), unless the authorization is terminated  or revoked sooner.       Influenza A by PCR NEGATIVE NEGATIVE Final   Influenza B by PCR NEGATIVE NEGATIVE Final    Comment: (NOTE) The Xpert Xpress SARS-CoV-2/FLU/RSV plus assay is intended as an aid in the diagnosis of influenza from Nasopharyngeal swab specimens and should not be used as a sole basis for treatment. Nasal washings and aspirates are unacceptable for Xpert Xpress SARS-CoV-2/FLU/RSV testing.  Fact Sheet for Patients: bloggercourse.com  Fact Sheet for Healthcare Providers: seriousbroker.it  This test is not yet approved or cleared by the [place]  FDA and has been authorized for detection and/or diagnosis of SARS-CoV-2 by FDA under an Emergency Use Authorization (EUA). This EUA will remain in  effect (meaning this test can be used) for the duration of the COVID-19 declaration under Section 564(b)(1) of the Act, 21 U.S.C. section 360bbb-3(b)(1), unless the authorization is terminated or revoked.     Resp Syncytial Virus by PCR NEGATIVE NEGATIVE Final    Comment: (NOTE) Fact Sheet for Patients: bloggercourse.com  Fact Sheet for Healthcare Providers: seriousbroker.it  This test is not yet approved or cleared by the [place]  FDA and has been authorized for detection and/or diagnosis of SARS-CoV-2 by FDA under an Emergency Use Authorization (EUA). This EUA will remain in effect (meaning this test can be used) for the duration of the COVID-19 declaration under Section 564(b)(1) of the Act, 21 U.S.C. section 360bbb-3(b)(1), unless the authorization is terminated or revoked.  Performed at [facility], [address], [place], [place] [address]  Scheduled Meds:  feeding supplement  237 mL Oral BID BM   feeding supplement  237 mL Oral BID BM   folic acid   1 mg Oral Daily   furosemide   40 mg Intravenous Daily   heparin   5,000 Units Subcutaneous Q8H   multivitamin with minerals  1 tablet Oral Daily   thiamine   100 mg Oral Daily   Or   thiamine   100 mg Intravenous Daily   Continuous Infusions:  Procedures/Studies: ECHOCARDIOGRAM COMPLETE Result Date: 03/09/2023    ECHOCARDIOGRAM REPORT   Patient Name:   Thomas [name] [facility] Date of Exam: 03/09/2023 Medical Rec #:  [id number]         Height:       71.0 in Accession #:    [id number]        Weight:       173.5 lb Date of Birth:  14-Sep-1960         BSA:          1.985 m Patient Age:    62 years          BP:           132/68 mmHg Patient Gender: M                 HR:           93 bpm. Exam Location:  [facility] Procedure: 2D Echo, Cardiac Doppler and Color Doppler Indications:    CHF-Acute Diastolic I50.31  History:        Patient has no prior history of Echocardiogram  examinations.                 Risk Factors:Hypertension. Alcohol abuse.  Sonographer:    Aida Pizza RCS Referring Phys: [phone] Pate Aylward IMPRESSIONS  1. The aortic valve is abnormal. There is a linear shaped echodensity, measuring 2.0 x 0.35 cm, attached to the non-coronary cusp of aortic valve leaflet with motion independent of the aortic valve concerning for infective endocarditis. Best appreciated  in PLAX, SAX and A5C views. Aortic valve regurgitation is severe, centrally directed. Aortic regurgitation PHT measures 130 msec. Suboptimal SSN images, possibly has diastolic flow reversal in the descending aorta. Mild aortic valve stenosis. Aortic valve mean gradient measures 13.0 mmHg. Aortic valve Vmax measures 2.50 m/s. Strongly consider TEE for further evaluation.  2. Left ventricular ejection fraction, by estimation, is >75%. Left ventricular ejection fraction by PLAX is 84 %. The left ventricle has hyperdynamic function. The left ventricle has no regional wall motion abnormalities. There is mild asymmetric left ventricular hypertrophy of the basal and septal segments. Left ventricular diastolic parameters are indeterminate. Elevated left ventricular end-diastolic pressure.  3. Right ventricular systolic function is normal. The right ventricular size is normal. Tricuspid regurgitation signal is inadequate for assessing PA pressure.  4. Left atrial size was severely dilated.  5. The mitral valve is normal in structure. Trivial mitral valve regurgitation. No evidence of mitral stenosis.  6. The inferior vena cava is dilated in size with >50% respiratory variability, suggesting right atrial pressure of 8 mmHg.  7. Increased flow velocities may be secondary to anemia, thyrotoxicosis, hyperdynamic or high flow state. Comparison(s): No prior Echocardiogram. FINDINGS  Left Ventricle: Left ventricular ejection fraction, by estimation, is >75%. Left ventricular ejection fraction by PLAX is 84 %. The left ventricle has  hyperdynamic function. The left ventricle has no regional wall motion abnormalities. The left ventricular internal cavity size was normal in size. There is mild asymmetric  left ventricular hypertrophy of the basal and septal segments. Left ventricular diastolic parameters are indeterminate. Elevated left ventricular end-diastolic pressure. Right Ventricle: The right ventricular size is normal. No increase in right ventricular wall thickness. Right ventricular systolic function is normal. Tricuspid regurgitation signal is inadequate for assessing PA pressure. Left Atrium: Left atrial size was severely dilated. Right Atrium: Right atrial size was normal in size. Pericardium: There is no evidence of pericardial effusion. Mitral Valve: The mitral valve is normal in structure. Trivial mitral valve regurgitation. No evidence of mitral valve stenosis. Tricuspid Valve: The tricuspid valve is normal in structure. Tricuspid valve regurgitation is not demonstrated. No evidence of tricuspid stenosis. Aortic Valve: The aortic valve is abnormal. Aortic valve regurgitation is severe. Aortic regurgitation PHT measures 130 msec. Mild aortic stenosis is present. Aortic valve mean gradient measures 13.0 mmHg. Aortic valve peak gradient measures 25.0 mmHg. Aortic valve area, by VTI measures 2.07 cm. Pulmonic Valve: The pulmonic valve was grossly normal. Pulmonic valve regurgitation is not visualized. No evidence of pulmonic stenosis. Aorta: The aortic root is normal in size and structure. Venous: The inferior vena cava is dilated in size with greater than 50% respiratory variability, suggesting right atrial pressure of 8 mmHg. IAS/Shunts: No atrial level shunt detected by color flow Doppler.  LEFT VENTRICLE PLAX 2D LV EF:         Left            Diastology                ventricular     LV e' medial:    8.16 cm/s                ejection        LV E/e' medial:  21.8                fraction by     LV e' lateral:   6.85 cm/s                 PLAX is 84      LV E/e' lateral: 26.0                %. LVIDd:         5.40 cm LVIDs:         2.50 cm LV PW:         1.10 cm LV IVS:        1.00 cm LVOT diam:     2.00 cm LV SV:         104 LV SV Index:   52 LVOT Area:     3.14 cm  RIGHT VENTRICLE RV S prime:     17.30 cm/s TAPSE (M-mode): 3.6 cm LEFT ATRIUM              Index        RIGHT ATRIUM           Index LA diam:        4.40 cm  2.22 cm/m   RA Area:     15.50 cm LA Vol (A2C):   81.6 ml  41.11 ml/m  RA Volume:   38.40 ml  19.35 ml/m LA Vol (A4C):   116.0 ml 58.44 ml/m LA Biplane Vol: 99.5 ml  50.13 ml/m  AORTIC VALVE AV Area (Vmax):    2.24 cm AV Area (Vmean):   2.25 cm AV Area (VTI):     2.07 cm AV Vmax:  250.00 cm/s AV Vmean:          166.000 cm/s AV VTI:            0.500 m AV Peak Grad:      25.0 mmHg AV Mean Grad:      13.0 mmHg LVOT Vmax:         178.00 cm/s LVOT Vmean:        119.000 cm/s LVOT VTI:          0.330 m LVOT/AV VTI ratio: 0.66 AI PHT:            130 msec  AORTA Ao Root diam: 3.60 cm MITRAL VALVE MV Area (PHT): 3.31 cm     SHUNTS MV Decel Time: 229 msec     Systemic VTI:  0.33 m MR Peak grad: 66.3 mmHg     Systemic Diam: 2.00 cm MR Mean grad: 43.0 mmHg MR Vmax:      407.00 cm/s MR Vmean:     304.0 cm/s MV E velocity: 178.00 cm/s MV A velocity: 106.00 cm/s MV E/A ratio:  1.68 Vishnu Priya Mallipeddi Electronically signed by Diannah Late Mallipeddi Signature Date/Time: 03/09/2023/7:30:08 PM    Final    CT Angio Chest PE W and/or Wo Contrast Result Date: 03/08/2023 CLINICAL DATA:  Recent surgery. Cough. Concern for pulmonary embolism. EXAM: CT ANGIOGRAPHY CHEST WITH CONTRAST TECHNIQUE: Multidetector CT imaging of the chest was performed using the standard protocol during bolus administration of intravenous contrast. Multiplanar CT image reconstructions and MIPs were obtained to evaluate the vascular anatomy. RADIATION DOSE REDUCTION: This exam was performed according to the departmental dose-optimization program which  includes automated exposure control, adjustment of the mA and/or kV according to patient size and/or use of iterative reconstruction technique. CONTRAST:  75mL OMNIPAQUE  IOHEXOL  350 MG/ML SOLN COMPARISON:  Chest radiograph dated 03/08/2023. FINDINGS: Evaluation of this exam is limited due to respiratory motion. Cardiovascular: There is no cardiomegaly or pericardial effusion. There is coronary vascular calcification. Mild atherosclerotic calcification of the thoracic aorta. No aneurysmal dilatation or dissection. The origins of the great vessels of the aortic arch appear patent. Evaluation of the pulmonary arteries is limited due to severe respiratory motion and suboptimal opacification and timing of the contrast. No large or central pulmonary artery embolus identified. Mediastinum/Nodes: No hilar or mediastinal adenopathy. The esophagus is grossly unremarkable. No mediastinal fluid collection. Lungs/Pleura: Moderate bilateral pleural effusions with partial compressive atelectasis of the lower lobes. There is diffuse interstitial and interlobular septal prominence suggestive of edema. Areas of airspace ground-glass density in the right upper lobe may represent edema, although pneumonia is not excluded. No pneumothorax. The central airways are patent. Upper Abdomen: Partially visualized left moderate hydronephrosis versus parapelvic cyst. CT abdomen pelvis or renal ultrasound may provide better evaluation. Musculoskeletal: No acute osseous pathology. Review of the MIP images confirms the above findings. IMPRESSION: 1. No CT evidence of large or central pulmonary artery embolus. 2. Findings of CHF with moderate bilateral pleural effusions. Superimposed pneumonia is not excluded. 3. Partially visualized left moderate hydronephrosis versus parapelvic cyst. CT abdomen pelvis or renal ultrasound may provide better evaluation. 4.  Aortic Atherosclerosis (ICD10-I70.0). Electronically Signed   By: Vanetta Chou M.D.   On:  03/08/2023 12:03   DG Chest Port 1 View Result Date: 03/08/2023 CLINICAL DATA:  Cough. EXAM: PORTABLE CHEST 1 VIEW COMPARISON:  12/20/2021. FINDINGS: There are increased interstitial markings with lower lobe predominance and several Kerley B-lines, favoring congestive heart failure/pulmonary edema. Bilateral lung fields are  otherwise clear. Bilateral costophrenic angles are clear. Normal cardio-mediastinal silhouette, considering AP technique. No acute osseous abnormalities. The soft tissues are within normal limits. IMPRESSION: *Findings favor mild congestive heart failure/pulmonary edema. Electronically Signed   By: Ree Molt M.D.   On: 03/08/2023 08:13   DG MINI C-ARM IMAGE ONLY Result Date: 02/27/2023 There is no interpretation for this exam.  This order is for images obtained during a surgical procedure.  Please See Surgeries Tab for more information regarding the procedure.   DG MINI C-ARM IMAGE ONLY Result Date: 02/09/2023 There is no interpretation for this exam.  This order is for images obtained during a surgical procedure.  Please See Surgeries Tab for more information regarding the procedure.    Alm Schneider, DO  [facility]  If 7PM-7AM, please contact night-coverage [email or web address] Password [email or web address] 03/10/2023, 10:36 AM   LOS: 2 days

## 2023-03-10 NOTE — Progress Notes (Signed)
 Sugar tong splint placed on pt's right arm by ED tech per MD order. Pt tolerated fair. Pt with good movement of fingers, cap refill brisk, color pink, normal sensation post splint application. MD Tat notified that splint now in place.

## 2023-03-10 NOTE — Progress Notes (Signed)
 Orthopedic Tech Progress Note Patient Details:  Thomas ORN [facility] 04/02/1960 [id number]  Patient ID: Thomas [name], male   DOB: [birth year], 63 y.o.   MRN: [id number] Splint applied by ED tech before [facility] was transported to [facility]. Thomas [name] 03/10/2023, 4:55 PM

## 2023-03-10 NOTE — Plan of Care (Signed)

## 2023-03-10 NOTE — Progress Notes (Addendum)
 Care Link here for patient transport to [facility] Fiance updated and states understanding. Pt has his cell phone and comforter in his possession and fiance has all of pt's other personal belongings (clothing, phone charger, toiletries, shoes, hat).

## 2023-03-10 NOTE — Plan of Care (Signed)
  Problem: Education: Goal: Knowledge of General Education information will improve Description: Including pain rating scale, medication(s)/side effects and non-pharmacologic comfort measures 03/10/2023 0713 by Catharine Kand, RN Outcome: Progressing 03/10/2023 0713 by Catharine Kand, RN Outcome: Progressing   Problem: Health Behavior/Discharge Planning: Goal: Ability to manage health-related needs will improve 03/10/2023 0713 by Catharine Kand, RN Outcome: Progressing 03/10/2023 0713 by Catharine Kand, RN Outcome: Progressing   Problem: Clinical Measurements: Goal: Ability to maintain clinical measurements within normal limits will improve 03/10/2023 0713 by Catharine Kand, RN Outcome: Progressing 03/10/2023 0713 by Catharine Kand, RN Outcome: Progressing Goal: Will remain free from infection 03/10/2023 0713 by Catharine Kand, RN Outcome: Progressing 03/10/2023 0713 by Catharine Kand, RN Outcome: Progressing Goal: Diagnostic test results will improve 03/10/2023 0713 by Catharine Kand, RN Outcome: Progressing 03/10/2023 0713 by Nicodemus Denk, RN Outcome: Progressing Goal: Respiratory complications will improve 03/10/2023 0713 by Megean Fabio, RN Outcome: Progressing 03/10/2023 0713 by Niang Mitcheltree, RN Outcome: Progressing Goal: Cardiovascular complication will be avoided 03/10/2023 0713 by Bhavya Grand, RN Outcome: Progressing 03/10/2023 0713 by Catharine Kand, RN Outcome: Progressing   Problem: Activity: Goal: Risk for activity intolerance will decrease 03/10/2023 0713 by Catharine Kand, RN Outcome: Progressing 03/10/2023 0713 by Catharine Kand, RN Outcome: Progressing   Problem: Nutrition: Goal: Adequate nutrition will be maintained 03/10/2023 0713 by Catharine Kand, RN Outcome: Progressing 03/10/2023 0713 by Catharine Kand, RN Outcome: Progressing   Problem: Coping: Goal: Level of anxiety will decrease 03/10/2023 0713 by Catharine Kand, RN Outcome: Progressing 03/10/2023 0713 by Catharine Kand, RN Outcome: Progressing   Problem:  Elimination: Goal: Will not experience complications related to bowel motility 03/10/2023 0713 by Catharine Kand, RN Outcome: Progressing 03/10/2023 0713 by Catharine Kand, RN Outcome: Progressing Goal: Will not experience complications related to urinary retention 03/10/2023 0713 by Catharine Kand, RN Outcome: Progressing 03/10/2023 0713 by Catharine Kand, RN Outcome: Progressing   Problem: Pain Management: Goal: General experience of comfort will improve 03/10/2023 0713 by Geofrey Silliman, RN Outcome: Progressing 03/10/2023 0713 by Catharine Kand, RN Outcome: Progressing   Problem: Safety: Goal: Ability to remain free from injury will improve 03/10/2023 0713 by Catharine Kand, RN Outcome: Progressing 03/10/2023 0713 by Catharine Kand, RN Outcome: Progressing   Problem: Skin Integrity: Goal: Risk for impaired skin integrity will decrease 03/10/2023 0713 by Catharine Kand, RN Outcome: Progressing 03/10/2023 0713 by Catharine Kand, RN Outcome: Progressing

## 2023-03-11 DIAGNOSIS — S52201G Unspecified fracture of shaft of right ulna, subsequent encounter for closed fracture with delayed healing: Secondary | ICD-10-CM | POA: Diagnosis not present

## 2023-03-11 DIAGNOSIS — R509 Fever, unspecified: Secondary | ICD-10-CM

## 2023-03-11 DIAGNOSIS — I10 Essential (primary) hypertension: Secondary | ICD-10-CM

## 2023-03-11 DIAGNOSIS — I330 Acute and subacute infective endocarditis: Secondary | ICD-10-CM | POA: Diagnosis not present

## 2023-03-11 DIAGNOSIS — I5033 Acute on chronic diastolic (congestive) heart failure: Secondary | ICD-10-CM | POA: Diagnosis not present

## 2023-03-11 DIAGNOSIS — S52291G Other fracture of shaft of right ulna, subsequent encounter for closed fracture with delayed healing: Secondary | ICD-10-CM

## 2023-03-11 DIAGNOSIS — F1010 Alcohol abuse, uncomplicated: Secondary | ICD-10-CM | POA: Diagnosis not present

## 2023-03-11 LAB — BASIC METABOLIC PANEL
Anion gap: 7 (ref 5–15)
BUN: 10 mg/dL (ref 8–23)
CO2: 22 mmol/L (ref 22–32)
Calcium: 8.4 mg/dL — ABNORMAL LOW (ref 8.9–10.3)
Chloride: 105 mmol/L (ref 98–111)
Creatinine, Ser: 0.86 mg/dL (ref 0.61–1.24)
GFR, Estimated: >60 mL/min (ref >60)
Glucose, Bld: 103 mg/dL — ABNORMAL HIGH (ref 70–99)
Potassium: 3.9 mmol/L (ref 3.5–5.1)
Sodium: 134 mmol/L — ABNORMAL LOW (ref 135–145)

## 2023-03-11 LAB — CBC
HCT: 29.2 % — ABNORMAL LOW (ref 39.0–52.0)
Hemoglobin: 9.4 g/dL — ABNORMAL LOW (ref 13.0–17.0)
MCH: 28.1 pg (ref 26.0–34.0)
MCHC: 32.2 g/dL (ref 30.0–36.0)
MCV: 87.4 fL (ref 80.0–100.0)
Platelets: 144 10*3/uL — ABNORMAL LOW (ref 150–400)
RBC: 3.34 MIL/uL — ABNORMAL LOW (ref 4.22–5.81)
RDW: 16.6 % — ABNORMAL HIGH (ref 11.5–15.5)
WBC: 7.1 10*3/uL (ref 4.0–10.5)
nRBC: 0 % (ref 0.0–0.2)

## 2023-03-11 LAB — MAGNESIUM: Magnesium: 2 mg/dL (ref 1.7–2.4)

## 2023-03-11 LAB — SEDIMENTATION RATE: Sed Rate: 38 mm/h — ABNORMAL HIGH (ref 0–16)

## 2023-03-11 MED ORDER — DIPHENHYDRAMINE HCL 25 MG PO CAPS
25.0000 mg | ORAL_CAPSULE | Freq: Three times a day (TID) | ORAL | Status: DC | PRN
  Administered 2023-03-11 – 2023-03-26 (×23): 25 mg via ORAL
  Filled 2023-03-11 (×23): qty 1

## 2023-03-11 MED ORDER — SODIUM CHLORIDE 0.9 % IV SOLN
2.0000 g | INTRAVENOUS | Status: DC
  Administered 2023-03-11 – 2023-03-13 (×3): 2 g via INTRAVENOUS
  Filled 2023-03-11 (×4): qty 20

## 2023-03-11 MED ORDER — HYDROXYZINE HCL 10 MG PO TABS
10.0000 mg | ORAL_TABLET | Freq: Once | ORAL | Status: DC
  Filled 2023-03-11: qty 1

## 2023-03-11 MED ORDER — HYDROXYZINE HCL 10 MG PO TABS
10.0000 mg | ORAL_TABLET | Freq: Once | ORAL | Status: AC
  Administered 2023-03-11: 10 mg via ORAL
  Filled 2023-03-11: qty 1

## 2023-03-11 MED ORDER — TRAMADOL HCL 50 MG PO TABS
50.0000 mg | ORAL_TABLET | Freq: Four times a day (QID) | ORAL | Status: DC | PRN
  Administered 2023-03-11 – 2023-03-12 (×2): 50 mg via ORAL
  Filled 2023-03-11 (×2): qty 1

## 2023-03-11 MED ORDER — TRAMADOL HCL 50 MG PO TABS: 50.0000 mg | ORAL_TABLET | Freq: Four times a day (QID) | ORAL | Status: DC | PRN

## 2023-03-11 MED ORDER — FENTANYL CITRATE PF 50 MCG/ML IJ SOSY
25.0000 ug | PREFILLED_SYRINGE | INTRAMUSCULAR | Status: DC | PRN
  Administered 2023-03-11 – 2023-03-14 (×18): 25 ug via INTRAVENOUS
  Filled 2023-03-11 (×19): qty 1

## 2023-03-11 MED ORDER — ENOXAPARIN SODIUM 40 MG/0.4ML IJ SOSY
40.0000 mg | PREFILLED_SYRINGE | INTRAMUSCULAR | Status: DC
  Administered 2023-03-11 – 2023-03-15 (×4): 40 mg via SUBCUTANEOUS
  Filled 2023-03-11 (×4): qty 0.4

## 2023-03-11 MED ORDER — OXYCODONE HCL 5 MG PO TABS: 5.0000 mg | ORAL_TABLET | ORAL | Status: DC | PRN

## 2023-03-11 MED ORDER — FUROSEMIDE 40 MG PO TABS
40.0000 mg | ORAL_TABLET | Freq: Every day | ORAL | Status: DC
  Administered 2023-03-12 – 2023-03-14 (×3): 40 mg via ORAL
  Filled 2023-03-11 (×3): qty 1

## 2023-03-11 MED ORDER — VANCOMYCIN HCL 1750 MG/350ML IV SOLN
1750.0000 mg | Freq: Once | INTRAVENOUS | Status: AC
  Administered 2023-03-11: 1750 mg via INTRAVENOUS
  Filled 2023-03-11: qty 350

## 2023-03-11 MED ORDER — SPIRONOLACTONE 12.5 MG HALF TABLET
12.5000 mg | ORAL_TABLET | Freq: Every day | ORAL | Status: DC
  Administered 2023-03-11 – 2023-03-14 (×4): 12.5 mg via ORAL
  Filled 2023-03-11 (×4): qty 1

## 2023-03-11 MED ORDER — SODIUM CHLORIDE 0.9 % IV SOLN
2.0000 g | Freq: Three times a day (TID) | INTRAVENOUS | Status: DC
  Administered 2023-03-11: 2 g via INTRAVENOUS
  Filled 2023-03-11: qty 12.5

## 2023-03-11 MED ORDER — LORAZEPAM 1 MG PO TABS
1.0000 mg | ORAL_TABLET | ORAL | Status: DC | PRN
  Filled 2023-03-11: qty 1

## 2023-03-11 MED ORDER — VANCOMYCIN HCL 1250 MG/250ML IV SOLN
1250.0000 mg | Freq: Two times a day (BID) | INTRAVENOUS | Status: DC
  Administered 2023-03-12 – 2023-03-13 (×5): 1250 mg via INTRAVENOUS
  Filled 2023-03-11 (×6): qty 250

## 2023-03-11 NOTE — Consult Note (Signed)
 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  Was having some discomfort at pin site yesterday, dressing was changed and arm was re-splinted yesterday, states this has improved RUE significantly.  Pin site was checked and noted to be clean and intact, XR were taken of wrist and forearm which show stable appearance of the hardware.    He had follow up with myself scheduled for tomorrow for his 2 week postop check from his recent surgery.     Past Medical History:  Diagnosis Date   Blood dyscrasia    GERD (gastroesophageal reflux disease)    History of kidney stones    Hypertension    Thrombocytopenia (HCC) 04/12/2016   Past Surgical History:  Procedure Laterality Date   BONE BIOPSY Right 02/09/2023   Procedure: BONE BIOPSY;  Surgeon: Arlinda Buster, MD;  Location: [facility];  Service: Orthopedics;  Laterality: Right;   HARDWARE REMOVAL Right 02/09/2023   Procedure: RIGHT FOREARM HARDWARE REMOVAL;  Surgeon: Arlinda Buster, MD;  Location: [facility];  Service: Orthopedics;  Laterality: Right;   IRRIGATION AND DEBRIDEMENT ELBOW Right 02/09/2023   Procedure: RIGHT FOREARM IRRIGATION AND DEBRIDEMENT;  Surgeon: Arlinda Buster, MD;  Location: [facility];  Service: Orthopedics;  Laterality: Right;   ORIF ULNAR FRACTURE Right 02/27/2023   Procedure: RIGHT OPEN REDUCTION INTERNAL FIXATION (ORIF) ULNAR FRACTURE;  Surgeon: Arlinda Buster, MD;   Location: [facility];  Service: Orthopedics;  Laterality: Right;   renalectomy Left    WRIST OSTEOTOMY Right 10/24/2022   Procedure: RIGHT WRIST ULNAR SHORTENING OSTEOTOMY;  Surgeon: Arlinda Buster, MD;  Location: [facility];  Service: Orthopedics;  Laterality: Right;  regional block   Social History   Socioeconomic History   Marital status: Single    Spouse name: Not on file   Number of children: Not on file   Years of education: Not on file   Highest education level: Not on file  Occupational History   Not on file  Tobacco Use   Smoking status: Never   Smokeless tobacco: Never  Substance and Sexual Activity   Alcohol use: Yes    Comment: occasionally   Drug use: No   Sexual activity: Yes  Other Topics Concern   Not on file  Social History Narrative   Not on file   Social Drivers of Health   Financial Resource Strain: Not on file  Food Insecurity: No Food Insecurity (03/10/2023)   Hunger Vital Sign    Worried About Running Out of Food in the Last Year: Never true    Ran Out of Food in the Last Year: Never true  Transportation Needs: No Transportation Needs (03/10/2023)   PRAPARE - Administrator, Civil Service (Medical): No    Lack of Transportation (Non-Medical): No  Physical Activity: Not on file  Stress: Not on file  Social Connections: Socially Integrated (03/10/2023)   Social Connection and Isolation Panel [NHANES]    Frequency of Communication with Friends and Family: Twice  a week    Frequency of Social Gatherings with Friends and Family: Twice a week    Attends Religious Services: 1 to 4 times per year    Active Member of [employer] or Organizations: Yes    Attends Engineer, Structural: More than 4 times per year    Marital Status: Living with partner   Family History  Problem Relation Age of Onset   Healthy Mother    Cancer Father    - negative except otherwise stated in the family history section No Known  Allergies Prior to Admission medications   Medication Sig Start Date End Date Taking? Authorizing Provider  acetaminophen  (TYLENOL ) 650 MG CR tablet Take 1,300 mg by mouth every 8 (eight) hours as needed for pain.   Yes [provider]  ALPRAZolam  (XANAX ) 1 MG tablet Take 1 mg by mouth at bedtime as needed for anxiety.   Yes [provider]  ibuprofen  (ADVIL ) 800 MG tablet Take 1 tablet (800 mg total) by mouth 3 (three) times daily. Take with food Patient taking differently: Take 200 mg by mouth every 8 (eight) hours as needed for fever, headache or mild pain (pain score 1-3). Take with food 05/09/19  Yes Avegno, Komlanvi S, FNP  olmesartan (BENICAR) 40 MG tablet Take 40 mg by mouth daily.   Yes [provider]  omeprazole (PRILOSEC OTC) 20 MG tablet Take 20 mg by mouth daily.   Yes [provider]  oxyCODONE  (ROXICODONE ) 5 MG immediate release tablet Take 1 tablet (5 mg total) by mouth every 6 (six) hours as needed. Patient not taking: Reported on 03/08/2023 02/26/23   Kollyn Lingafelter, Gildardo, MD  DULoxetine (CYMBALTA) 60 MG capsule Take 60 mg by mouth daily. Patient not taking: Reported on 03/08/2023    [provider]   DG Wrist 2 Views Right Result Date: 03/10/2023 CLINICAL DATA:  Right wrist pain. EXAM: RIGHT WRIST - 2 VIEW COMPARISON:  Right wrist x-rays dated February 05, 2023. FINDINGS: Postsurgical changes related to interval revision ORIF of the distal ulnar fracture and pinning of the distal radioulnar joint. No acute fracture or dislocation. Old healed distal radius fracture with chronic dorsal angulation. Chronic avulsion fracture of the ulnar styloid process again noted. Joint spaces are preserved. Bone mineralization is normal. Soft tissue swelling of the distal forearm. IMPRESSION: 1. Interval revision ORIF of the distal ulnar fracture and pinning of the distal radioulnar joint. No acute osseous abnormality. 2. Old distal radius and ulnar styloid process  fractures. Electronically Signed   By: Elsie ONEIDA Shoulder M.D.   On: 03/10/2023 12:34   DG Forearm Right Result Date: 03/10/2023 CLINICAL DATA:  RIGHT wrist pain EXAM: RIGHT FOREARM - 2 VIEW COMPARISON:  02/05/2023 FINDINGS: Interval hardware revision with replacement of dynamic fixation plate along the distal ulna. Improved alignment. K-wire extends through the distal ulna into the radial metaphysis. IMPRESSION: No acute findings.  Interval hardware revision as above. Electronically Signed   By: Jackquline Boxer M.D.   On: 03/10/2023 12:31   ECHOCARDIOGRAM COMPLETE Result Date: 03/09/2023    ECHOCARDIOGRAM REPORT   Patient Name:   Thomas [name] Date of Exam: 03/09/2023 Medical Rec #:  [id number]         Height:       71.0 in Accession #:    [id number]        Weight:       173.5 lb Date of Birth:  [birth year]         BSA:  1.985 m Patient Age:    62 years          BP:           132/68 mmHg Patient Gender: M                 HR:           93 bpm. Exam Location:  [facility] Procedure: 2D Echo, Cardiac Doppler and Color Doppler Indications:    CHF-Acute Diastolic I50.31  History:        Patient has no prior history of Echocardiogram examinations.                 Risk Factors:Hypertension. Alcohol abuse.  Sonographer:    Aida Pizza RCS Referring Phys: [phone] Evans TAT IMPRESSIONS  1. The aortic valve is abnormal. There is a linear shaped echodensity, measuring 2.0 x 0.35 cm, attached to the non-coronary cusp of aortic valve leaflet with motion independent of the aortic valve concerning for infective endocarditis. Best appreciated  in PLAX, SAX and A5C views. Aortic valve regurgitation is severe, centrally directed. Aortic regurgitation PHT measures 130 msec. Suboptimal SSN images, possibly has diastolic flow reversal in the descending aorta. Mild aortic valve stenosis. Aortic valve mean gradient measures 13.0 mmHg. Aortic valve Vmax measures 2.50 m/s. Strongly consider TEE for further evaluation.  2. Left  ventricular ejection fraction, by estimation, is >75%. Left ventricular ejection fraction by PLAX is 84 %. The left ventricle has hyperdynamic function. The left ventricle has no regional wall motion abnormalities. There is mild asymmetric left ventricular hypertrophy of the basal and septal segments. Left ventricular diastolic parameters are indeterminate. Elevated left ventricular end-diastolic pressure.  3. Right ventricular systolic function is normal. The right ventricular size is normal. Tricuspid regurgitation signal is inadequate for assessing PA pressure.  4. Left atrial size was severely dilated.  5. The mitral valve is normal in structure. Trivial mitral valve regurgitation. No evidence of mitral stenosis.  6. The inferior vena cava is dilated in size with >50% respiratory variability, suggesting right atrial pressure of 8 mmHg.  7. Increased flow velocities may be secondary to anemia, thyrotoxicosis, hyperdynamic or high flow state. Comparison(s): No prior Echocardiogram. FINDINGS  Left Ventricle: Left ventricular ejection fraction, by estimation, is >75%. Left ventricular ejection fraction by PLAX is 84 %. The left ventricle has hyperdynamic function. The left ventricle has no regional wall motion abnormalities. The left ventricular internal cavity size was normal in size. There is mild asymmetric left ventricular hypertrophy of the basal and septal segments. Left ventricular diastolic parameters are indeterminate. Elevated left ventricular end-diastolic pressure. Right Ventricle: The right ventricular size is normal. No increase in right ventricular wall thickness. Right ventricular systolic function is normal. Tricuspid regurgitation signal is inadequate for assessing PA pressure. Left Atrium: Left atrial size was severely dilated. Right Atrium: Right atrial size was normal in size. Pericardium: There is no evidence of pericardial effusion. Mitral Valve: The mitral valve is normal in structure.  Trivial mitral valve regurgitation. No evidence of mitral valve stenosis. Tricuspid Valve: The tricuspid valve is normal in structure. Tricuspid valve regurgitation is not demonstrated. No evidence of tricuspid stenosis. Aortic Valve: The aortic valve is abnormal. Aortic valve regurgitation is severe. Aortic regurgitation PHT measures 130 msec. Mild aortic stenosis is present. Aortic valve mean gradient measures 13.0 mmHg. Aortic valve peak gradient measures 25.0 mmHg. Aortic valve area, by VTI measures 2.07 cm. Pulmonic Valve: The pulmonic valve was grossly normal. Pulmonic valve regurgitation is  not visualized. No evidence of pulmonic stenosis. Aorta: The aortic root is normal in size and structure. Venous: The inferior vena cava is dilated in size with greater than 50% respiratory variability, suggesting right atrial pressure of 8 mmHg. IAS/Shunts: No atrial level shunt detected by color flow Doppler.  LEFT VENTRICLE PLAX 2D LV EF:         Left            Diastology                ventricular     LV e' medial:    8.16 cm/s                ejection        LV E/e' medial:  21.8                fraction by     LV e' lateral:   6.85 cm/s                PLAX is 84      LV E/e' lateral: 26.0                %. LVIDd:         5.40 cm LVIDs:         2.50 cm LV PW:         1.10 cm LV IVS:        1.00 cm LVOT diam:     2.00 cm LV SV:         104 LV SV Index:   52 LVOT Area:     3.14 cm  RIGHT VENTRICLE RV S prime:     17.30 cm/s TAPSE (M-mode): 3.6 cm LEFT ATRIUM              Index        RIGHT ATRIUM           Index LA diam:        4.40 cm  2.22 cm/m   RA Area:     15.50 cm LA Vol (A2C):   81.6 ml  41.11 ml/m  RA Volume:   38.40 ml  19.35 ml/m LA Vol (A4C):   116.0 ml 58.44 ml/m LA Biplane Vol: 99.5 ml  50.13 ml/m  AORTIC VALVE AV Area (Vmax):    2.24 cm AV Area (Vmean):   2.25 cm AV Area (VTI):     2.07 cm AV Vmax:           250.00 cm/s AV Vmean:          166.000 cm/s AV VTI:            0.500 m AV Peak Grad:       25.0 mmHg AV Mean Grad:      13.0 mmHg LVOT Vmax:         178.00 cm/s LVOT Vmean:        119.000 cm/s LVOT VTI:          0.330 m LVOT/AV VTI ratio: 0.66 AI PHT:            130 msec  AORTA Ao Root diam: 3.60 cm MITRAL VALVE MV Area (PHT): 3.31 cm     SHUNTS MV Decel Time: 229 msec     Systemic VTI:  0.33 m MR Peak grad: 66.3 mmHg     Systemic Diam: 2.00 cm MR Mean grad: 43.0 mmHg MR Vmax:      407.00 cm/s  MR Vmean:     304.0 cm/s MV E velocity: 178.00 cm/s MV A velocity: 106.00 cm/s MV E/A ratio:  1.68 Vishnu Priya Mallipeddi Electronically signed by Diannah Late Mallipeddi Signature Date/Time: 03/09/2023/7:30:08 PM    Final    - Positive ROS: All other systems have been reviewed and were otherwise negative with the exception of those mentioned in the HPI and as above.  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 Admitted to medical service for ongoing cardiac workup and treatment for acute HF and aortic valve vegetation.   Plan: Maintain RUE in splint, NWB RUE Depending on dispo plan, may transition to cast while in house and do suture removal for RUE later this week  Pain control Dispo per primary   Thank you for the consult and the opportunity to see Mr. Johannsen  Chailyn Racette Estela) Arlinda, M.D. [facility] 9:32 AM

## 2023-03-11 NOTE — Progress Notes (Signed)
 Critical blood cx from called in by Marybelle Killings from [facility] lab. Dr. Ella Jubilee notified.

## 2023-03-11 NOTE — Progress Notes (Signed)
 Pharmacy Antibiotic Note  Thomas [name] is a 63 y.o. male admitted on 03/08/2023 with  endocarditis .  Pharmacy has been consulted for vancomycin  and cefepime  dosing.  Echo with echodensity on aortic valve concerning for endocarditis. WBC wnl, tmax 100, Scr stable.   Plan: Vancomycin  1750 mg IV x 1  Then start vancomycin  1250 mg IV every 12 hours (eAUC 534, Scr .0.86, Vd 0.72)  Start cefepime  2g IV every 8 hours  Monitor cultures, renal function, and overall clinical picture  Vanc levels as indicated F/u TEE and ID recs   Height: 5' 11 (180.3 cm) Weight: 78.2 kg (172 lb 6.4 oz) IBW/kg (Calculated) : 75.3  Temp (24hrs), Avg:98.8 F (37.1 C), Min:97.9 F (36.6 C), Max:100 F (37.8 C)  Recent Labs  Lab 03/08/23 0811 03/09/23 1032 03/10/23 0358 03/11/23 0347  WBC 3.5* 4.8 5.8 7.1  CREATININE 0.68 0.77 0.73 0.86    Estimated Creatinine Clearance: 94.9 mL/min (by C-G formula based on SCr of 0.86 mg/dL).    No Known Allergies  Antimicrobials this admission: 1/12 Vancomycin  >>  1/12 Cefepime  >>   Microbiology results: 1/11 BCx: GPCs  Repeat Bcx sent   Thank you for allowing pharmacy to be a part of this patient's care.  Hubert LILLETTE Ruths 03/11/2023 10:30 AM

## 2023-03-11 NOTE — Assessment & Plan Note (Signed)
 Continue blood pressure monitoring

## 2023-03-11 NOTE — Assessment & Plan Note (Addendum)
 Continue pain control with hydrocodone and for severe pain fentanyl.  Follow up with orthopedics.  No apparent post operative local infection.

## 2023-03-11 NOTE — H&P (View-Only) (Signed)
 Progress Note   Patient: LETCHER [name] FMW:[id number] DOB: 1960/03/15 DOA: 03/08/2023     3 DOS: the patient was seen and examined on 03/11/2023   Brief hospital course: Thomas [name] was admitted to the hospital with the working diagnosis of heart failure decompensation.   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 In the ED, the patient was afebrile hemodynamically stable with oxygen saturation 96% room air.  WBC 3.5, hemoglobin 7.2, platelets 146.  Sodium 135, potassium 3.2, bicarbonate 22, serum creatinine 0.68.  AST 79, ALT 34, alk phosphatase 96, total bilirubin 0.8.  BNP 211.  Troponin 27.  Chest x-ray showed increased vascular markings and interstitial markings.  CTA chest was negative for PE.  It showed bilateral pleural effusions with diffuse interstitial and interlobular septal thickening.   The patient was started on IV furosemide .  He was admitted for further evaluation and treatment of his fluid overload.  Echocardiogram with aortic valve vegetation.   01/11 transfer to [facility] for further care.   Assessment and Plan: * Acute on chronic diastolic CHF (congestive heart failure) (HCC) Echocardiogram with preserved LV systolic function with EF >75%, hyperdynamic LV, mild asymmetric LVH of the basal and septal segments. RV systolic function preserved, LA with severe dilatation, severe aortic valve regurgitation, mild aortic stenosis, linear shaped echodensity measuring 2.0 x 0,35 cm attached to the non coronary cusp of the aortic valve leaflet with motion independent of the aortic valve.   Volume status has improved. Since admission fluid balance is negative 4,939 ml  Systolic blood pressure 130 mmHg range.   Transition to oral furosemide  and add spironolactone .  If  blood pressure stable will add ARB.   Aortic valve vegetation Positive blood cultures gram positive cocci.  Pending TEE.   Will start on empiric antibiotic therapy and follow up cultures in am.  Follow up with ID recommendations.   Alcohol abuse No signs of acute alcohol withdrawal.  Will hold on CIWA protocol and will add as needed po lorazepam .   Closed fracture of shaft of right ulna with delayed healing Continue pain control with tramadol  and for severe pain fentanyl .  Follow up with orthopedics.  No apparent post operative local infection.   Essential hypertension Continue blood pressure monitoring.   Thrombocytopenia (HCC) Pancytopenia has resolved.  Anemia of chronic disease.  Cell count has been stable.         Subjective: Patient with no chest pain or dyspnea, edema has resolved, positive right upper extremity pain   Physical Exam: Vitals:   03/10/23 2354 03/11/23 0345 03/11/23 0633 03/11/23 0935  BP: 126/60 (!) 122/57  133/66  Pulse: 87 96  95  Resp: 18 20 19 18   Temp: 98.9 F (37.2 C) 98.6 F (37 C) 97.9 F (36.6 C) 99.3 F (37.4 C)  TempSrc: Oral Oral Oral Oral  SpO2: 96% 99%  97%  Weight:   78.2 kg   Height:       Neurology awake and alert ENT with mild pallor Cardiovascular with S1 and S2 present and regular, positive diastolic murmur at the base, no gallops or rubs No JVD  Respiratory with no rales or wheezing, no rhonchi Abdomen with no distention  No lower extremity edema  Data Reviewed:    Family Communication: no family at the bedside   Disposition: Status is: Inpatient  Remains inpatient appropriate because: heart failure, pending TEE   Planned Discharge Destination: Home      Author: Elidia Toribio Furnace, MD 03/11/2023 11:15 AM  For on call review [email or web address].

## 2023-03-11 NOTE — Progress Notes (Signed)
 Progress Note   Patient: Thomas [name]:[id number] DOB: 1960/03/15 DOA: 03/08/2023     3 DOS: the patient was seen and examined on 03/11/2023   Brief hospital course: Mr. Thomas [name] was admitted to the hospital with the working diagnosis of heart failure decompensation.   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 In the ED, the patient was afebrile hemodynamically stable with oxygen saturation 96% room air.  WBC 3.5, hemoglobin 7.2, platelets 146.  Sodium 135, potassium 3.2, bicarbonate 22, serum creatinine 0.68.  AST 79, ALT 34, alk phosphatase 96, total bilirubin 0.8.  BNP 211.  Troponin 27.  Chest x-ray showed increased vascular markings and interstitial markings.  CTA chest was negative for PE.  It showed bilateral pleural effusions with diffuse interstitial and interlobular septal thickening.   The patient was started on IV furosemide .  He was admitted for further evaluation and treatment of his fluid overload.  Echocardiogram with aortic valve vegetation.   01/11 transfer to [facility] for further care.   Assessment and Plan: * Acute on chronic diastolic CHF (congestive heart failure) (HCC) Echocardiogram with preserved LV systolic function with EF >75%, hyperdynamic LV, mild asymmetric LVH of the basal and septal segments. RV systolic function preserved, LA with severe dilatation, severe aortic valve regurgitation, mild aortic stenosis, linear shaped echodensity measuring 2.0 x 0,35 cm attached to the non coronary cusp of the aortic valve leaflet with motion independent of the aortic valve.   Volume status has improved. Since admission fluid balance is negative 4,939 ml  Systolic blood pressure 130 mmHg range.   Transition to oral furosemide  and add spironolactone .  If  blood pressure stable will add ARB.   Aortic valve vegetation Positive blood cultures gram positive cocci.  Pending TEE.   Will start on empiric antibiotic therapy and follow up cultures in am.  Follow up with ID recommendations.   Alcohol abuse No signs of acute alcohol withdrawal.  Will hold on CIWA protocol and will add as needed po lorazepam .   Closed fracture of shaft of right ulna with delayed healing Continue pain control with tramadol  and for severe pain fentanyl .  Follow up with orthopedics.  No apparent post operative local infection.   Essential hypertension Continue blood pressure monitoring.   Thrombocytopenia (HCC) Pancytopenia has resolved.  Anemia of chronic disease.  Cell count has been stable.         Subjective: Patient with no chest pain or dyspnea, edema has resolved, positive right upper extremity pain   Physical Exam: Vitals:   03/10/23 2354 03/11/23 0345 03/11/23 0633 03/11/23 0935  BP: 126/60 (!) 122/57  133/66  Pulse: 87 96  95  Resp: 18 20 19 18   Temp: 98.9 F (37.2 C) 98.6 F (37 C) 97.9 F (36.6 C) 99.3 F (37.4 C)  TempSrc: Oral Oral Oral Oral  SpO2: 96% 99%  97%  Weight:   78.2 kg   Height:       Neurology awake and alert ENT with mild pallor Cardiovascular with S1 and S2 present and regular, positive diastolic murmur at the base, no gallops or rubs No JVD  Respiratory with no rales or wheezing, no rhonchi Abdomen with no distention  No lower extremity edema  Data Reviewed:    Family Communication: no family at the bedside   Disposition: Status is: Inpatient  Remains inpatient appropriate because: heart failure, pending TEE   Planned Discharge Destination: Home      Author: Elidia Toribio Furnace, MD 03/11/2023 11:15 AM  For on call review [email or web address].

## 2023-03-11 NOTE — Plan of Care (Signed)
  Problem: Health Behavior/Discharge Planning: Goal: Ability to manage health-related needs will improve Outcome: Progressing   Problem: Clinical Measurements: Goal: Cardiovascular complication will be avoided Outcome: Progressing   Problem: Activity: Goal: Risk for activity intolerance will decrease Outcome: Progressing   Problem: Nutrition: Goal: Adequate nutrition will be maintained Outcome: Progressing   Problem: Coping: Goal: Level of anxiety will decrease Outcome: Progressing   Problem: Pain Management: Goal: General experience of comfort will improve Outcome: Progressing   Problem: Safety: Goal: Ability to remain free from injury will improve Outcome: Progressing

## 2023-03-11 NOTE — Assessment & Plan Note (Addendum)
 Positive blood cultures gram positive cocci.  TEE with severe AI with aortic valve vegetation, severe MR with with mitral valve vegetation, likely perforation flail segment.   Continue antibiotic with vancomycin  and ceftriaxone .  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

## 2023-03-11 NOTE — Assessment & Plan Note (Addendum)
 Echocardiogram with preserved LV systolic function with EF >75%, hyperdynamic LV, mild asymmetric LVH of the basal and septal segments. RV systolic function preserved, LA with severe dilatation, severe aortic valve regurgitation, mild aortic stenosis, linear shaped echodensity measuring 2.0 x 0,35 cm attached to the non coronary cusp of the aortic valve leaflet with motion independent of the aortic valve.   Continue with oral furosemide  and spironolactone .  If blood pressure stable will add ARB.

## 2023-03-11 NOTE — Assessment & Plan Note (Addendum)
 Pancytopenia has resolved.  Anemia of chronic disease.  Cell count has been stable.

## 2023-03-11 NOTE — Consult Note (Signed)
 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  Started on IV Lasix .  CTA showed bilateral pleural effusion with interlobular thickening.  TTE showed echodensity on aortic valve leaflet measuring 2X 0.35 cm.   Recommendations:  -Discontinue cefepime , start ceftriaxone  - Follow-up blood cultures - TEE   #Acute on chronic heart failure - Management per primary  #EtOH abuse #Closed fracture of shaft of right ulna status post nonunion repair with with plate fixation on 12/31 -X ray of right forearm and wrist showed no acute findinggs - Ortho consulted recommended to  maintain splint with possible transtion to cast Microbiology:   Antibiotics: Vancomycin  and cefepime   Cultures: Blood 11 Urine  Other   HPI: Thomas [name] is a 63 y.o. 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  On arrival to [facility] patient afebrile WBC 3.5K.  Chest x-ray showed increased vascular markings and interstitial markings.  CTA showed bilateral pleural effusions with interlobar thickening.   Admitted for acute on chronic diastolic heart failure.  Echo showed linear shaped echodensity measuring 2X 0.35 cm on aortic valve leaflet concerning for infective endocarditis.  Patient transferred to [facility] for TEE.  ID engaged for antibiotic recommendations.   Review of Systems: Review of Systems  All other systems reviewed and are negative.   Past Medical History:  Diagnosis Date   Blood dyscrasia    GERD (gastroesophageal reflux disease)    History of kidney stones    Hypertension    Thrombocytopenia (HCC) 04/12/2016    Social History   Tobacco Use   Smoking status: Never   Smokeless tobacco: Never  Substance Use Topics   Alcohol use: Yes    Comment: occasionally   Drug use: No    Family History  Problem Relation Age of Onset   Healthy Mother    Cancer Father    Scheduled Meds:  enoxaparin  (LOVENOX ) injection  40 mg Subcutaneous Q24H   feeding supplement  237 mL Oral BID BM   feeding supplement  237 mL Oral BID BM   folic acid   1 mg Oral Daily   [START ON 03/12/2023] furosemide   40 mg Oral Daily   multivitamin with minerals  1 tablet Oral Daily   spironolactone   12.5 mg Oral Daily   thiamine   100 mg Oral Daily   Or   thiamine   100 mg Intravenous Daily   Continuous Infusions:  ceFEPime  (MAXIPIME ) IV 2 g (03/11/23 1123)   [START ON 03/12/2023] vancomycin      vancomycin  1,750 mg (03/11/23 1232)   PRN Meds:.acetaminophen , diphenhydrAMINE , fentaNYL  (SUBLIMAZE ) injection, LORazepam , methocarbamol , ondansetron  (ZOFRAN ) IV, traMADol  No Known  Allergies  OBJECTIVE: Blood pressure 133/66, pulse 95, temperature 99.3 F (37.4 C), temperature source Oral, resp. rate 18, height 5' 11 (1.803 m), weight 78.2 kg, SpO2 97%.  Physical Exam Constitutional:      General: He is not in acute distress.    Appearance: He is normal weight. He is not toxic-appearing.  HENT:     Head: Normocephalic and atraumatic.     Right Ear: External ear normal.     Left Ear: External  ear normal.     Nose: No congestion or rhinorrhea.     Mouth/Throat:     Mouth: Mucous membranes are moist.     Pharynx: Oropharynx is clear.  Eyes:     Extraocular Movements: Extraocular movements intact.     Conjunctiva/sclera: Conjunctivae normal.     Pupils: Pupils are equal, round, and reactive to light.  Cardiovascular:     Rate and Rhythm: Normal rate and regular rhythm.     Heart sounds: No murmur heard.    No friction rub. No gallop.  Pulmonary:     Effort: Pulmonary effort is normal.     Breath sounds: Normal breath sounds.  Abdominal:     General: Abdomen is flat. Bowel sounds are normal.     Palpations: Abdomen is soft.  Musculoskeletal:     Cervical back: Normal range of motion and neck supple.     Comments: Right forearm splint  Skin:    General: Skin is warm and dry.  Neurological:     General: No focal deficit present.     Mental Status: He is oriented to person, place, and time.  Psychiatric:        Mood and Affect: Mood normal.     Lab Results Lab Results  Component Value Date   WBC 7.1 03/11/2023   HGB 9.4 (L) 03/11/2023   HCT 29.2 (L) 03/11/2023   MCV 87.4 03/11/2023   PLT 144 (L) 03/11/2023    Lab Results  Component Value Date   CREATININE 0.86 03/11/2023   BUN 10 03/11/2023   NA 134 (L) 03/11/2023   K 3.9 03/11/2023   CL 105 03/11/2023   CO2 22 03/11/2023    Lab Results  Component Value Date   ALT 34 03/08/2023   AST 79 (H) 03/08/2023   ALKPHOS 96 03/08/2023   BILITOT 0.8 03/08/2023       Loney Stank, MD [facility] 03/11/2023, 2:08 PM I have personally spent 82 minutes involved in face-to-face and non-face-to-face activities for this patient on the day of the visit. Professional time spent includes the following activities: Preparing to see the patient (review of tests), Obtaining and/or reviewing separately obtained history (admission/discharge record), Performing a medically  appropriate examination and/or evaluation , Ordering medications/tests/procedures, referring and communicating with other health care professionals, Documenting clinical information in the EMR, Independently interpreting results (not separately reported), Communicating results to the patient/family/caregiver, Counseling and educating the patient/family/caregiver and Care coordination (not separately reported).

## 2023-03-11 NOTE — Assessment & Plan Note (Signed)
 No signs of acute alcohol withdrawal.  Will hold on CIWA protocol and will add as needed po lorazepam.

## 2023-03-12 ENCOUNTER — Encounter (HOSPITAL_COMMUNITY)
Admission: EM | Disposition: A | Discharge status: Home Health | Payer: Self-pay | Source: Home / Self Care | Attending: Thoracic Surgery (Cardiothoracic Vascular Surgery)

## 2023-03-12 ENCOUNTER — Inpatient Hospital Stay (HOSPITAL_COMMUNITY): Payer: 59

## 2023-03-12 ENCOUNTER — Encounter (HOSPITAL_COMMUNITY): Payer: Self-pay | Admitting: Internal Medicine

## 2023-03-12 ENCOUNTER — Inpatient Hospital Stay (HOSPITAL_COMMUNITY): Payer: 59 | Admitting: Certified Registered"

## 2023-03-12 DIAGNOSIS — I080 Rheumatic disorders of both mitral and aortic valves: Secondary | ICD-10-CM | POA: Diagnosis not present

## 2023-03-12 DIAGNOSIS — I110 Hypertensive heart disease with heart failure: Secondary | ICD-10-CM

## 2023-03-12 DIAGNOSIS — I351 Nonrheumatic aortic (valve) insufficiency: Secondary | ICD-10-CM

## 2023-03-12 DIAGNOSIS — I5033 Acute on chronic diastolic (congestive) heart failure: Secondary | ICD-10-CM

## 2023-03-12 DIAGNOSIS — I330 Acute and subacute infective endocarditis: Secondary | ICD-10-CM | POA: Diagnosis not present

## 2023-03-12 DIAGNOSIS — I340 Nonrheumatic mitral (valve) insufficiency: Secondary | ICD-10-CM

## 2023-03-12 DIAGNOSIS — I350 Nonrheumatic aortic (valve) stenosis: Secondary | ICD-10-CM | POA: Diagnosis not present

## 2023-03-12 DIAGNOSIS — S52201G Unspecified fracture of shaft of right ulna, subsequent encounter for closed fracture with delayed healing: Secondary | ICD-10-CM | POA: Diagnosis not present

## 2023-03-12 DIAGNOSIS — F1010 Alcohol abuse, uncomplicated: Secondary | ICD-10-CM | POA: Diagnosis not present

## 2023-03-12 HISTORY — PX: TRANSESOPHAGEAL ECHOCARDIOGRAM (CATH LAB): EP1270

## 2023-03-12 LAB — ECHO TEE
AR max vel: 2.75 cm2
AV Area VTI: 2.69 cm2
AV Area mean vel: 2.61 cm2
AV Mean grad: 22 mm[Hg]
AV Peak grad: 35.8 mm[Hg]
Ao pk vel: 2.99 m/s
MV M vel: 5.11 m/s
MV Peak grad: 104.4 mm[Hg]
MV VTI: 4.51 cm2
P 1/2 time: 393 ms
Radius: 0.9 cm

## 2023-03-12 LAB — CBC
HCT: 29 % — ABNORMAL LOW (ref 39.0–52.0)
Hemoglobin: 9.3 g/dL — ABNORMAL LOW (ref 13.0–17.0)
MCH: 28.4 pg (ref 26.0–34.0)
MCHC: 32.1 g/dL (ref 30.0–36.0)
MCV: 88.4 fL (ref 80.0–100.0)
Platelets: 133 10*3/uL — ABNORMAL LOW (ref 150–400)
RBC: 3.28 MIL/uL — ABNORMAL LOW (ref 4.22–5.81)
RDW: 16.2 % — ABNORMAL HIGH (ref 11.5–15.5)
WBC: 6.4 10*3/uL (ref 4.0–10.5)
nRBC: 0 % (ref 0.0–0.2)

## 2023-03-12 LAB — BLOOD CULTURE ID PANEL (REFLEXED) - BCID2

## 2023-03-12 LAB — FUNGUS CULTURE WITH STAIN

## 2023-03-12 LAB — FUNGAL ORGANISM REFLEX

## 2023-03-12 LAB — BASIC METABOLIC PANEL
Anion gap: 7 (ref 5–15)
BUN: 11 mg/dL (ref 8–23)
CO2: 20 mmol/L — ABNORMAL LOW (ref 22–32)
Calcium: 8.3 mg/dL — ABNORMAL LOW (ref 8.9–10.3)
Chloride: 103 mmol/L (ref 98–111)
Creatinine, Ser: 0.79 mg/dL (ref 0.61–1.24)
GFR, Estimated: >60 mL/min (ref >60)
Glucose, Bld: 90 mg/dL (ref 70–99)
Potassium: 4 mmol/L (ref 3.5–5.1)
Sodium: 130 mmol/L — ABNORMAL LOW (ref 135–145)

## 2023-03-12 LAB — FUNGUS CULTURE RESULT

## 2023-03-12 SURGERY — TRANSESOPHAGEAL ECHOCARDIOGRAM (TEE) (CATHLAB)
Anesthesia: Monitor Anesthesia Care

## 2023-03-12 MED ORDER — SODIUM CHLORIDE 0.9% FLUSH: 3.0000 mL | Freq: Two times a day (BID) | INTRAVENOUS | Status: DC

## 2023-03-12 MED ORDER — SODIUM CHLORIDE 0.9 % IV SOLN: INTRAVENOUS | Status: DC | PRN

## 2023-03-12 MED ORDER — HYDROCODONE-ACETAMINOPHEN 10-325 MG PO TABS: 1.0000 | ORAL_TABLET | ORAL | Status: DC | PRN

## 2023-03-12 MED ORDER — PROPOFOL 10 MG/ML IV BOLUS
INTRAVENOUS | Status: DC | PRN
  Administered 2023-03-12: 100 mg via INTRAVENOUS

## 2023-03-12 MED ORDER — SODIUM CHLORIDE 0.9% FLUSH: 3.0000 mL | INTRAVENOUS | Status: DC | PRN

## 2023-03-12 MED ORDER — LIDOCAINE 2% (20 MG/ML) 5 ML SYRINGE
INTRAMUSCULAR | Status: DC | PRN
  Administered 2023-03-12: 80 mg via INTRAVENOUS

## 2023-03-12 MED ORDER — PROPOFOL 500 MG/50ML IV EMUL
INTRAVENOUS | Status: DC | PRN
  Administered 2023-03-12: 200 ug/kg/min via INTRAVENOUS

## 2023-03-12 NOTE — CV Procedure (Signed)
    TRANSESOPHAGEAL ECHOCARDIOGRAM   NAME:  Thomas [name]    MRN: [id number] DOB:  [birth year]    ADMIT DATE: 03/08/2023  INDICATIONS: Bacteremia  PROCEDURE:   Informed consent was obtained prior to the procedure. The risks, benefits and alternatives for the procedure were discussed and the patient comprehended these risks.  Risks include, but are not limited to, cough, sore throat, vomiting, nausea, somnolence, esophageal and stomach trauma or perforation, bleeding, low blood pressure, aspiration, pneumonia, infection, trauma to the teeth and death.    Procedural time out performed. The oropharynx was anesthetized with topical 1% benzocaine.    Anesthesia was administered by Dr. Cleotilde .  The patient was administered 450 mg of propofol  and 100 mg of lidocaine  to achieve and maintain moderate conscious sedation.  The patient's heart rate, blood pressure, and oxygen saturation are monitored continuously during the procedure. The period of conscious sedation is 22 minutes, of which I was present face-to-face 100% of this time.   The transesophageal probe was inserted in the esophagus and stomach without difficulty and multiple views were obtained.   COMPLICATIONS:    There were no immediate complications.  KEY FINDINGS:  Severe AI with aortic valve vegetation. Severe MR with mitral valve vegetation, likely perforation and flail segment.  Normal LV/RV function.  Full report to follow. Further management per primary team.   Darryle T. Barbaraann, MD, [facility]  [facility]  [address] [place], [place] [address] [phone]  10:11 AM

## 2023-03-12 NOTE — Transfer of Care (Signed)
 Immediate Anesthesia Transfer of Care Note  Patient: Thomas [name]  Procedure(s) Performed: TRANSESOPHAGEAL ECHOCARDIOGRAM  Patient Location: PACU  Anesthesia Type:MAC  Level of Consciousness: awake and drowsy  Airway & Oxygen Therapy: Patient Spontanous Breathing and Patient connected to nasal cannula oxygen  Post-op Assessment: Report given to RN and Post -op Vital signs reviewed and stable  Post vital signs: Reviewed and stable  Last Vitals:  Vitals Value Taken Time  BP    Temp    Pulse    Resp    SpO2      Last Pain:  Vitals:   03/12/23 0910  TempSrc:   PainSc: 0-No pain      Patients Stated Pain Goal: 8 (03/12/23 0715)  Complications: No notable events documented.

## 2023-03-12 NOTE — Plan of Care (Signed)
  Problem: Education: Goal: Knowledge of General Education information will improve Description: Including pain rating scale, medication(s)/side effects and non-pharmacologic comfort measures Outcome: Progressing   Problem: Health Behavior/Discharge Planning: Goal: Ability to manage health-related needs will improve Outcome: Progressing   Problem: Clinical Measurements: Goal: Ability to maintain clinical measurements within normal limits will improve Outcome: Progressing   Problem: Activity: Goal: Risk for activity intolerance will decrease Outcome: Progressing   Problem: Nutrition: Goal: Adequate nutrition will be maintained Outcome: Progressing   Problem: Coping: Goal: Level of anxiety will decrease Outcome: Progressing   Problem: Elimination: Goal: Will not experience complications related to bowel motility Outcome: Progressing Goal: Will not experience complications related to urinary retention Outcome: Progressing   Problem: Safety: Goal: Ability to remain free from injury will improve Outcome: Progressing   Problem: Skin Integrity: Goal: Risk for impaired skin integrity will decrease Outcome: Progressing   

## 2023-03-12 NOTE — Progress Notes (Signed)
   [name] has been requested to perform a transesophageal echocardiogram on Thomas [name] [facility] for evaluation of possible endocarditis.  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 Currently admitted with CHF. Echocardiogram 1/10 showed a linear shaped echodensity on the aortic valve, measuring 2.0x0.35. Also showed severe aortic valve regurgitation.    The patient does NOT have any absolute or relative contraindications to a Transesophageal Echocardiogram (TEE).  The patient has: History of Severe Valve Disease (stenosis or regurgitation)  After careful review of history and examination, the risks and benefits of transesophageal echocardiogram have been explained including risks of esophageal damage, perforation (1:10,000 risk), bleeding, pharyngeal hematoma as well as other potential complications associated with conscious sedation including aspiration, arrhythmia, respiratory failure and death. Alternatives to treatment were discussed, questions were answered. Patient is willing to proceed.   Signed, Rollo FABIENE Louder, PA-C  03/12/2023 6:52 AM

## 2023-03-12 NOTE — Progress Notes (Signed)
 PHARMACY - PHYSICIAN COMMUNICATION CRITICAL VALUE ALERT - BLOOD CULTURE IDENTIFICATION (BCID)  Thomas [name] is an 63 y.o. male who presented to [facility] on 03/08/2023 with a chief complaint of endocarditis   Name of physician (or Provider) Contacted: Dr. Shona  Current antibiotics: Vancomycin , Ceftriaxone   Changes to prescribed antibiotics recommended:  No changes for now  Results for orders placed or performed during the hospital encounter of 03/08/23  Blood Culture ID Panel (Reflexed) (Collected: 03/10/2023 10:57 AM)  Result Value Ref Range   Enterococcus faecalis NOT DETECTED NOT DETECTED   Enterococcus Faecium NOT DETECTED NOT DETECTED   Listeria monocytogenes NOT DETECTED NOT DETECTED   Staphylococcus species NOT DETECTED NOT DETECTED   Staphylococcus aureus (BCID) NOT DETECTED NOT DETECTED   Staphylococcus epidermidis NOT DETECTED NOT DETECTED   Staphylococcus lugdunensis NOT DETECTED NOT DETECTED   Streptococcus species DETECTED (A) NOT DETECTED   Streptococcus agalactiae NOT DETECTED NOT DETECTED   Streptococcus pneumoniae NOT DETECTED NOT DETECTED   Streptococcus pyogenes NOT DETECTED NOT DETECTED   A.calcoaceticus-baumannii NOT DETECTED NOT DETECTED   Bacteroides fragilis NOT DETECTED NOT DETECTED   Enterobacterales NOT DETECTED NOT DETECTED   Enterobacter cloacae complex NOT DETECTED NOT DETECTED   Escherichia coli NOT DETECTED NOT DETECTED   Klebsiella aerogenes NOT DETECTED NOT DETECTED   Klebsiella oxytoca NOT DETECTED NOT DETECTED   Klebsiella pneumoniae NOT DETECTED NOT DETECTED   Proteus species NOT DETECTED NOT DETECTED   Salmonella species NOT DETECTED NOT DETECTED   Serratia marcescens NOT DETECTED NOT DETECTED   Haemophilus influenzae NOT DETECTED NOT DETECTED   Neisseria meningitidis NOT DETECTED NOT DETECTED   Pseudomonas aeruginosa NOT DETECTED NOT DETECTED   Stenotrophomonas maltophilia NOT DETECTED NOT DETECTED   Candida albicans NOT DETECTED NOT  DETECTED   Candida auris NOT DETECTED NOT DETECTED   Candida glabrata NOT DETECTED NOT DETECTED   Candida krusei NOT DETECTED NOT DETECTED   Candida parapsilosis NOT DETECTED NOT DETECTED   Candida tropicalis NOT DETECTED NOT DETECTED   Cryptococcus neoformans/gattii NOT DETECTED NOT DETECTED   Lynwood Mckusick, PharmD, BCPS Clinical Pharmacist Phone: [phone]

## 2023-03-12 NOTE — Anesthesia Preprocedure Evaluation (Signed)
 Anesthesia Evaluation  Patient identified by MRN, date of birth, ID band Patient awake    Reviewed: Allergy & Precautions, NPO status , Patient's Chart, lab work & pertinent test results  Airway Mallampati: II  TM Distance: >3 FB Neck ROM: Full    Dental no notable dental hx. (+) Teeth Intact, Dental Advisory Given   Pulmonary neg pulmonary ROS   Pulmonary exam normal breath sounds clear to auscultation       Cardiovascular hypertension, Pt. on medications +CHF  Normal cardiovascular exam Rhythm:Regular Rate:Normal     Neuro/Psych negative neurological ROS  negative psych ROS   GI/Hepatic ,GERD  Medicated and Controlled,,(+)     substance abuse  alcohol use  Endo/Other    Renal/GU      Musculoskeletal negative musculoskeletal ROS (+)    Abdominal   Peds  Hematology   Anesthesia Other Findings   Reproductive/Obstetrics negative OB ROS                             Anesthesia Physical Anesthesia Plan  ASA: 3  Anesthesia Plan: MAC   Post-op Pain Management: Minimal or no pain anticipated   Induction: Intravenous  PONV Risk Score and Plan: Treatment may vary due to age or medical condition and Aprepitant  Airway Management Planned: Natural Airway and Nasal Cannula  Additional Equipment: None  Intra-op Plan:   Post-operative Plan:   Informed Consent: I have reviewed the patients History and Physical, chart, labs and discussed the procedure including the risks, benefits and alternatives for the proposed anesthesia with the patient or authorized representative who has indicated his/her understanding and acceptance.     Dental advisory given  Plan Discussed with: CRNA and Anesthesiologist  Anesthesia Plan Comments:         Anesthesia Quick Evaluation

## 2023-03-12 NOTE — Progress Notes (Addendum)
 Progress Note   Patient: Thomas [name] FMW:[id number] DOB: 20-Feb-1961 DOA: 03/08/2023     4 DOS: the patient was seen and examined on 03/12/2023   Brief hospital course: Mr. Voiles was admitted to the hospital with the working diagnosis of heart failure decompensation.   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 In the ED, the patient was afebrile hemodynamically stable with oxygen saturation 96% room air.  WBC 3.5, hemoglobin 7.2, platelets 146.  Sodium 135, potassium 3.2, bicarbonate 22, serum creatinine 0.68.  AST 79, ALT 34, alk phosphatase 96, total bilirubin 0.8.  BNP 211.  Troponin 27.  Chest x-ray showed increased vascular markings and interstitial markings.  CTA chest was negative for PE.  It showed bilateral pleural effusions with diffuse interstitial and interlobular septal thickening.   The patient was started on IV furosemide .  He was admitted for further evaluation and treatment of his fluid overload.  Echocardiogram with aortic valve vegetation.   01/11 transfer to [facility] for further care.  01/13 TEE with severe AI with aortic valve vegetation and severe MR with mitral valve vegetation, likely perforation and flail segment.   Assessment and Plan: * Acute on chronic diastolic CHF (congestive heart failure) (HCC) Echocardiogram with preserved LV systolic function with EF >75%, hyperdynamic LV, mild asymmetric LVH of the basal and septal segments. RV systolic function preserved, LA with severe dilatation, severe aortic valve regurgitation, mild aortic stenosis, linear shaped echodensity measuring 2.0 x 0,35 cm attached to the non coronary cusp of the aortic valve leaflet with motion independent of the aortic valve.   Continue with oral furosemide  and  spironolactone .  If blood pressure stable will add ARB.   Aortic valve vegetation Positive blood cultures gram positive cocci.  TEE with severe AI with aortic valve vegetation, severe MR with with mitral valve vegetation, likely perforation flail segment.   Continue antibiotic with vancomycin  and ceftriaxone .  Follow up cultures and ID recommendations Will call CT surgery consultation.   Alcohol abuse No signs of acute alcohol withdrawal.  Will hold on CIWA protocol and will add as needed po lorazepam .   Closed fracture of shaft of right ulna with delayed healing Continue pain control with hydrocodone  and for severe pain fentanyl .  Follow up with orthopedics.  No apparent post operative local infection.   Essential hypertension Continue blood pressure monitoring.   Thrombocytopenia (HCC) Pancytopenia has resolved.  Anemia of chronic disease.  Cell count has been stable.         Subjective: Patient with pain in his left upper extremity, no chest pain or dyspnea.,   Physical Exam: Vitals:   03/12/23 1030 03/12/23 1040 03/12/23 1050 03/12/23 1121  BP: (!) 143/75 (!) 160/72 (!) 132/103 (!) 152/63  Pulse: 100 95 95 96  Resp: 17 (!) 27 20 20   Temp:    98.4 F (36.9 C)  TempSrc:    Oral  SpO2: 97% 95% 97% 98%  Weight:      Height:       Neurology awake and alert ENT with mild pallor Cardiovascular with S1 and S2 present, positive diastolic murmur at the base and systolic murmur at the apex No JVD No lower extremity edema Respiratory with no rales or wheezing, no rhonchi Abdomen with no distention  Data Reviewed:    Family Communication: no family at the  bedside   Disposition: Status is: Inpatient Remains inpatient appropriate because: endocarditis   Planned Discharge Destination: Home     Author: Elidia Toribio Furnace, MD 03/12/2023 3:19 PM  For on call review [email or web address].

## 2023-03-12 NOTE — Interval H&P Note (Signed)
 History and Physical Interval Note:  03/12/2023 9:18 AM  Thomas [name]  has presented today for surgery, with the diagnosis of AV vegetation.  The various methods of treatment have been discussed with the patient and family. After consideration of risks, benefits and other options for treatment, the patient has consented to  Procedure(s): TRANSESOPHAGEAL ECHOCARDIOGRAM (N/A) as a surgical intervention.  The patient's history has been reviewed, patient examined, no change in status, stable for surgery.  I have reviewed the patient's chart and labs.  Questions were answered to the patient's satisfaction.    NPO for TEE for AoV endocarditis.   Darryle T. Barbaraann, MD, [facility]  [facility]  [address] [place], [place] [address] [phone]  9:18 AM

## 2023-03-12 NOTE — Anesthesia Postprocedure Evaluation (Signed)
 Anesthesia Post Note  Patient: Thomas [name] [name]  Procedure(s) Performed: TRANSESOPHAGEAL ECHOCARDIOGRAM     Patient location during evaluation: PACU Anesthesia Type: MAC Level of consciousness: awake and alert Pain management: pain level controlled Vital Signs Assessment: post-procedure vital signs reviewed and stable Respiratory status: spontaneous breathing, nonlabored ventilation and respiratory function stable Cardiovascular status: blood pressure returned to baseline and stable Postop Assessment: no apparent nausea or vomiting Anesthetic complications: no   No notable events documented.  Last Vitals:  Vitals:   03/12/23 1040 03/12/23 1050  BP: (!) 160/72 (!) 132/103  Pulse: 95 95  Resp: (!) 27 20  Temp:    SpO2: 95% 97%    Last Pain:  Vitals:   03/12/23 1126  TempSrc:   PainSc: 6                  Butler Levander Pinal

## 2023-03-13 ENCOUNTER — Other Ambulatory Visit (HOSPITAL_COMMUNITY): Payer: 59

## 2023-03-13 ENCOUNTER — Inpatient Hospital Stay (HOSPITAL_COMMUNITY): Payer: 59

## 2023-03-13 ENCOUNTER — Encounter (HOSPITAL_COMMUNITY): Payer: Self-pay | Admitting: Cardiovascular Disease

## 2023-03-13 ENCOUNTER — Encounter (HOSPITAL_COMMUNITY)
Admission: EM | Disposition: A | Discharge status: Home Health | Payer: Self-pay | Source: Home / Self Care | Attending: Thoracic Surgery (Cardiothoracic Vascular Surgery)

## 2023-03-13 DIAGNOSIS — I059 Rheumatic mitral valve disease, unspecified: Secondary | ICD-10-CM | POA: Diagnosis not present

## 2023-03-13 DIAGNOSIS — F1010 Alcohol abuse, uncomplicated: Secondary | ICD-10-CM | POA: Diagnosis not present

## 2023-03-13 DIAGNOSIS — I339 Acute and subacute endocarditis, unspecified: Secondary | ICD-10-CM | POA: Diagnosis not present

## 2023-03-13 DIAGNOSIS — I2510 Atherosclerotic heart disease of native coronary artery without angina pectoris: Secondary | ICD-10-CM | POA: Diagnosis not present

## 2023-03-13 DIAGNOSIS — I5033 Acute on chronic diastolic (congestive) heart failure: Secondary | ICD-10-CM | POA: Diagnosis not present

## 2023-03-13 DIAGNOSIS — I351 Nonrheumatic aortic (valve) insufficiency: Secondary | ICD-10-CM | POA: Diagnosis not present

## 2023-03-13 DIAGNOSIS — I340 Nonrheumatic mitral (valve) insufficiency: Secondary | ICD-10-CM | POA: Diagnosis not present

## 2023-03-13 DIAGNOSIS — S52201G Unspecified fracture of shaft of right ulna, subsequent encounter for closed fracture with delayed healing: Secondary | ICD-10-CM | POA: Diagnosis not present

## 2023-03-13 DIAGNOSIS — I330 Acute and subacute infective endocarditis: Secondary | ICD-10-CM | POA: Diagnosis not present

## 2023-03-13 HISTORY — PX: CORONARY ANGIOGRAPHY: CATH118303

## 2023-03-13 LAB — CBC
HCT: 28.8 % — ABNORMAL LOW (ref 39.0–52.0)
HCT: 28.1 % — ABNORMAL LOW (ref 39.0–52.0)
Hemoglobin: 9.4 g/dL — ABNORMAL LOW (ref 13.0–17.0)
Hemoglobin: 9.2 g/dL — ABNORMAL LOW (ref 13.0–17.0)
MCH: 28.3 pg (ref 26.0–34.0)
MCH: 28.3 pg (ref 26.0–34.0)
MCHC: 32.6 g/dL (ref 30.0–36.0)
MCHC: 32.7 g/dL (ref 30.0–36.0)
MCV: 86.7 fL (ref 80.0–100.0)
MCV: 86.5 fL (ref 80.0–100.0)
Platelets: 138 10*3/uL — ABNORMAL LOW (ref 150–400)
Platelets: 140 10*3/uL — ABNORMAL LOW (ref 150–400)
RBC: 3.32 MIL/uL — ABNORMAL LOW (ref 4.22–5.81)
RBC: 3.25 MIL/uL — ABNORMAL LOW (ref 4.22–5.81)
RDW: 16.1 % — ABNORMAL HIGH (ref 11.5–15.5)
RDW: 15.9 % — ABNORMAL HIGH (ref 11.5–15.5)
WBC: 6.5 10*3/uL (ref 4.0–10.5)
WBC: 6.2 10*3/uL (ref 4.0–10.5)
nRBC: 0 % (ref 0.0–0.2)
nRBC: 0 % (ref 0.0–0.2)

## 2023-03-13 LAB — BASIC METABOLIC PANEL
Anion gap: 7 (ref 5–15)
BUN: 11 mg/dL (ref 8–23)
CO2: 20 mmol/L — ABNORMAL LOW (ref 22–32)
Calcium: 8.2 mg/dL — ABNORMAL LOW (ref 8.9–10.3)
Chloride: 105 mmol/L (ref 98–111)
Creatinine, Ser: 0.7 mg/dL (ref 0.61–1.24)
GFR, Estimated: >60 mL/min (ref >60)
Glucose, Bld: 100 mg/dL — ABNORMAL HIGH (ref 70–99)
Potassium: 3.9 mmol/L (ref 3.5–5.1)
Sodium: 132 mmol/L — ABNORMAL LOW (ref 135–145)

## 2023-03-13 LAB — CREATININE, SERUM
Creatinine, Ser: 0.7 mg/dL (ref 0.61–1.24)
GFR, Estimated: >60 mL/min (ref >60)

## 2023-03-13 SURGERY — CORONARY ANGIOGRAPHY (CATH LAB)
Anesthesia: LOCAL

## 2023-03-13 MED ORDER — ASPIRIN 81 MG PO CHEW
81.0000 mg | CHEWABLE_TABLET | ORAL | Status: DC
  Filled 2023-03-13: qty 1

## 2023-03-13 MED ORDER — SODIUM CHLORIDE 0.9 % IV SOLN
INTRAVENOUS | Status: AC
Start: 2023-03-13 — End: 2023-03-13

## 2023-03-13 MED ORDER — VERAPAMIL HCL 2.5 MG/ML IV SOLN
INTRAVENOUS | Status: DC | PRN
  Administered 2023-03-13: 10 mL via INTRA_ARTERIAL

## 2023-03-13 MED ORDER — MIDAZOLAM HCL 2 MG/2ML IJ SOLN
INTRAMUSCULAR | Status: DC | PRN
  Administered 2023-03-13: 1 mg via INTRAVENOUS

## 2023-03-13 MED ORDER — HEPARIN SODIUM (PORCINE) 1000 UNIT/ML IJ SOLN
INTRAMUSCULAR | Status: DC | PRN
  Administered 2023-03-13: 4000 [IU] via INTRAVENOUS

## 2023-03-13 MED ORDER — VERAPAMIL HCL 2.5 MG/ML IV SOLN
INTRAVENOUS | Status: AC
  Filled 2023-03-13: qty 2

## 2023-03-13 MED ORDER — MIDAZOLAM HCL 2 MG/2ML IJ SOLN
INTRAMUSCULAR | Status: AC
Start: 2023-03-13 — End: ?
  Filled 2023-03-13: qty 2

## 2023-03-13 MED ORDER — IOHEXOL 350 MG/ML SOLN
INTRAVENOUS | Status: DC | PRN
  Administered 2023-03-13: 55 mL

## 2023-03-13 MED ORDER — ASPIRIN 81 MG PO CHEW
CHEWABLE_TABLET | ORAL | Status: DC | PRN
  Administered 2023-03-13: 81 mg via ORAL

## 2023-03-13 MED ORDER — LABETALOL HCL 5 MG/ML IV SOLN: 10.0000 mg | INTRAVENOUS | Status: AC | PRN

## 2023-03-13 MED ORDER — SODIUM CHLORIDE 0.9 % IV SOLN: INTRAVENOUS | Status: DC

## 2023-03-13 MED ORDER — HEPARIN SODIUM (PORCINE) 1000 UNIT/ML IJ SOLN
INTRAMUSCULAR | Status: AC
  Filled 2023-03-13: qty 10

## 2023-03-13 MED ORDER — HYDRALAZINE HCL 20 MG/ML IJ SOLN: 10.0000 mg | INTRAMUSCULAR | Status: AC | PRN

## 2023-03-13 MED ORDER — SODIUM CHLORIDE 0.9 % IV SOLN
INTRAVENOUS | Status: AC | PRN
  Administered 2023-03-13: 500 mL via INTRAVENOUS

## 2023-03-13 MED ORDER — SODIUM CHLORIDE 0.9% FLUSH
3.0000 mL | Freq: Two times a day (BID) | INTRAVENOUS | Status: DC
  Administered 2023-03-13 – 2023-03-14 (×2): 3 mL via INTRAVENOUS

## 2023-03-13 MED ORDER — SODIUM CHLORIDE 0.9 % IV SOLN: 250.0000 mL | INTRAVENOUS | Status: AC | PRN

## 2023-03-13 MED ORDER — LIDOCAINE HCL (PF) 1 % IJ SOLN
INTRAMUSCULAR | Status: DC | PRN
  Administered 2023-03-13: 2 mL

## 2023-03-13 MED ORDER — HEPARIN (PORCINE) IN NACL 1000-0.9 UT/500ML-% IV SOLN
INTRAVENOUS | Status: DC | PRN
  Administered 2023-03-13 (×2): 500 mL

## 2023-03-13 MED ORDER — POLYETHYLENE GLYCOL 3350 17 G PO PACK
17.0000 g | PACK | Freq: Every day | ORAL | Status: DC
Start: 2023-03-13 — End: 2023-03-16
  Administered 2023-03-13 – 2023-03-15 (×3): 17 g via ORAL
  Filled 2023-03-13 (×3): qty 1

## 2023-03-13 MED ORDER — FENTANYL CITRATE (PF) 100 MCG/2ML IJ SOLN
INTRAMUSCULAR | Status: DC | PRN
  Administered 2023-03-13: 25 ug via INTRAVENOUS

## 2023-03-13 MED ORDER — LIDOCAINE HCL (PF) 1 % IJ SOLN
INTRAMUSCULAR | Status: AC
  Filled 2023-03-13: qty 30

## 2023-03-13 MED ORDER — ASPIRIN 81 MG PO CHEW
CHEWABLE_TABLET | ORAL | Status: AC
  Filled 2023-03-13: qty 1

## 2023-03-13 MED ORDER — FENTANYL CITRATE (PF) 100 MCG/2ML IJ SOLN
INTRAMUSCULAR | Status: AC
  Filled 2023-03-13: qty 2

## 2023-03-13 MED ORDER — ASPIRIN 81 MG PO CHEW: 81.0000 mg | CHEWABLE_TABLET | Freq: Every day | ORAL | Status: DC

## 2023-03-13 MED ORDER — SODIUM CHLORIDE 0.9% FLUSH: 3.0000 mL | INTRAVENOUS | Status: DC | PRN

## 2023-03-13 SURGICAL SUPPLY — 8 items
CATH INFINITI 5FR MULTPACK ANG (CATHETERS) IMPLANT
DEVICE RAD COMP TR BAND LRG (VASCULAR PRODUCTS) IMPLANT
GLIDESHEATH SLEND SS 6F .021 (SHEATH) IMPLANT
GUIDEWIRE INQWIRE 1.5J.035X260 (WIRE) IMPLANT
INQWIRE 1.5J .035X260CM (WIRE) ×1 IMPLANT
KIT SINGLE USE MANIFOLD (KITS) IMPLANT
PACK CARDIAC CATHETERIZATION (CUSTOM PROCEDURE TRAY) ×1 IMPLANT
SHEATH PROBE COVER 6X72 (BAG) IMPLANT

## 2023-03-13 NOTE — Progress Notes (Signed)
 Heart Failure Navigator Progress Note  Assessed for Heart & Vascular TOC clinic readiness.  Patient does not meet criteria due to EF 60-65%, TCTS surgery consulted, No HF TOC per Dr. Noralee. .   Navigator will sign off at this time.   Stephane Haddock, BSN, Scientist, Clinical (histocompatibility And Immunogenetics) Only

## 2023-03-13 NOTE — H&P (View-Only) (Signed)
 [address]       [place] [address]             [phone]        QUENTION SCURA [facility] Medical Record #[id number] Date of Birth: [birth year]  Referring: No ref. provider found Primary Care: Assunta Found, MD Primary Cardiologist:None  Chief Complaint:    Chief Complaint  Patient presents with   Wound Infection  Shortness of breath  History of Present Illness:     Mr. 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 He underwent right wrist ulnar shortening osteotomy by ortho on 8/27. He then underwent right wrist ulnar shortening osteotomy plate removal and bone biopsies with cultures of the distal ulna  on 12/13 due to hardware failure and osteolysis. Cultures came back with no growth at that time. 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 He presented to the ED on 1/09 with one week of worsening shortness of breath, fatigue and increasing lower extremity edema. In the ED he was afebrile and hemodynamically stable on RA. BNP was 211, Troponin I was 27 and platelet count was 146,000. CXR showed increased vascular markings and interstitial markings and chest CTA showed bilateral pleural effusions with diffuse interstitial and interlobular septal thickening consistent with congestive heart failure or pulmonary edema. TTE on 1/10 showed LVEF>75%, severe aortic valve regurgitation with a linear shaped echodensity measuring 2.0cmx0.35cm concerning for endocarditis and mild aortic stenosis. TEE on 1/13 noted an aortic valve vegetation measuring 0.6cmx0.8cm located in the commissure between the NCC/LCC, severe aortic regurgitation, likely leaflet perforation, moderate aortic stenosis, severe mitral regurgitation  posteriorly directed, a vegetation of the A3 segment on the mitral valve with a flail A1/A2 segment with likely leaflet perforation. LVEF was estimated at 60-65% and there was mild (grade II) layered plaque involve the descending aorta. Blood cultures were taken and he was started on empiric Cefepime and Vancomycin. Blood cultures were positive for streptococcus, ID was consulted and recommended discontinuing Cefepime and starting Ceftriaxone.       Zubrod Score: At the time of surgery this patient's most appropriate activity status/level should be described as: []     0    Normal activity, no symptoms [x]     1    Restricted in physical strenuous activity but ambulatory, able to do out light work []     2    Ambulatory and capable of self care, unable to do work activities, up and about                 more than 50%  Of the time                            []     3    Only limited self care, in bed greater than 50% of waking hours []     4    Completely disabled, no self care, confined to bed or chair []     5    Moribund  Past Medical History:  Diagnosis Date   Blood dyscrasia    GERD (gastroesophageal reflux disease)    History of kidney stones    Hypertension    Thrombocytopenia (HCC) 04/12/2016  Past Surgical History:  Procedure Laterality Date   BONE BIOPSY Right 02/09/2023   Procedure: BONE BIOPSY;  Surgeon: Samuella Cota, MD;  Location: [facility];  Service: Orthopedics;  Laterality: Right;   HARDWARE REMOVAL Right 02/09/2023   Procedure: RIGHT FOREARM HARDWARE REMOVAL;  Surgeon: Samuella Cota, MD;  Location: [facility];  Service: Orthopedics;  Laterality: Right;   IRRIGATION AND DEBRIDEMENT ELBOW Right 02/09/2023   Procedure: RIGHT FOREARM IRRIGATION AND DEBRIDEMENT;  Surgeon: Samuella Cota, MD;  Location: [facility];  Service: Orthopedics;  Laterality: Right;   ORIF ULNAR FRACTURE Right 02/27/2023   Procedure: RIGHT OPEN  REDUCTION INTERNAL FIXATION (ORIF) ULNAR FRACTURE;  Surgeon: Samuella Cota, MD;  Location: [facility];  Service: Orthopedics;  Laterality: Right;   renalectomy Left    TRANSESOPHAGEAL ECHOCARDIOGRAM (CATH LAB) N/A 03/12/2023   Procedure: TRANSESOPHAGEAL ECHOCARDIOGRAM;  Surgeon: Sande Rives, MD;  Location: [facility];  Service: Cardiovascular;  Laterality: N/A;   WRIST OSTEOTOMY Right 10/24/2022   Procedure: RIGHT WRIST ULNAR SHORTENING OSTEOTOMY;  Surgeon: Samuella Cota, MD;  Location:  SURGERY CENTER;  Service: Orthopedics;  Laterality: Right;  regional block    Social History   Tobacco Use  Smoking Status Never  Smokeless Tobacco Never    Social History   Substance and Sexual Activity  Alcohol Use Yes   Comment: occasionally     No Known Allergies  Current Facility-Administered Medications  Medication Dose Route Frequency Provider Last Rate Last Admin   acetaminophen (TYLENOL) tablet 650 mg  650 mg Oral Q4H PRN Sande Rives, MD   650 mg at 03/10/23 1819   cefTRIAXone (ROCEPHIN) 2 g in sodium chloride 0.9 % 100 mL IVPB  2 g Intravenous Q24H Sande Rives, MD 200 mL/hr at 03/12/23 1458 2 g at 03/12/23 1458   diphenhydrAMINE (BENADRYL) capsule 25 mg  25 mg Oral Q8H PRN Sande Rives, MD   25 mg at 03/12/23 2325   enoxaparin (LOVENOX) injection 40 mg  40 mg Subcutaneous Q24H Sande Rives, MD   40 mg at 03/12/23 1121   feeding supplement (ENSURE ENLIVE / ENSURE PLUS) liquid 237 mL  237 mL Oral BID BM Sande Rives, MD   237 mL at 03/13/23 0731   feeding supplement (ENSURE ENLIVE / ENSURE PLUS) liquid 237 mL  237 mL Oral BID BM Sande Rives, MD   237 mL at 03/12/23 1458   fentaNYL (SUBLIMAZE) injection 25 mcg  25 mcg Intravenous Q2H PRN Sande Rives, MD   25 mcg at 03/13/23 0636   folic acid (FOLVITE) tablet 1 mg  1 mg Oral Daily O'Neal, Ronnald Ramp, MD   1 mg at 03/13/23 1610    furosemide (LASIX) tablet 40 mg  40 mg Oral Daily Sande Rives, MD   40 mg at 03/13/23 9604   HYDROcodone-acetaminophen (NORCO) 10-325 MG per tablet 1 tablet  1 tablet Oral Q4H PRN Arrien, York Ram, MD       LORazepam (ATIVAN) tablet 1 mg  1 mg Oral Q4H PRN Sande Rives, MD       methocarbamol (ROBAXIN) tablet 500 mg  500 mg Oral Q6H PRN Sande Rives, MD   500 mg at 03/11/23 2151   multivitamin with minerals tablet 1 tablet  1 tablet Oral Daily Sande Rives, MD   1 tablet at 03/13/23 0728   ondansetron (ZOFRAN) injection 4 mg  4 mg Intravenous Q6H  PRN Barbaraann Darryle Ned, MD       spironolactone  (ALDACTONE ) tablet 12.5 mg  12.5 mg Oral Daily O'Neal, Darryle Ned, MD   12.5 mg at 03/13/23 9272   thiamine  (VITAMIN B1) tablet 100 mg  100 mg Oral Daily Barbaraann Darryle Ned, MD   100 mg at 03/13/23 9272   Or   thiamine  (VITAMIN B1) injection 100 mg  100 mg Intravenous Daily Barbaraann Darryle Ned, MD       vancomycin  LUCRECIA) IVPB 1250 mg/250 mL  1,250 mg Intravenous Q12H Barbaraann Darryle Ned, MD 166.7 mL/hr at 03/12/23 2335 1,250 mg at 03/12/23 2335    Medications Prior to Admission  Medication Sig Dispense Refill Last Dose/Taking   acetaminophen  (TYLENOL ) 650 MG CR tablet Take 1,300 mg by mouth every 8 (eight) hours as needed for pain.   Past Week   ALPRAZolam  (XANAX ) 1 MG tablet Take 1 mg by mouth at bedtime as needed for anxiety.   Past Week   ibuprofen  (ADVIL ) 800 MG tablet Take 1 tablet (800 mg total) by mouth 3 (three) times daily. Take with food (Patient taking differently: Take 200 mg by mouth every 8 (eight) hours as needed for fever, headache or mild pain (pain score 1-3). Take with food) 60 tablet 0 Taking Differently   olmesartan (BENICAR) 40 MG tablet Take 40 mg by mouth daily.   Past Week   omeprazole (PRILOSEC OTC) 20 MG tablet Take 20 mg by mouth daily.   03/07/2023   oxyCODONE  (ROXICODONE ) 5 MG immediate release tablet Take 1 tablet (5  mg total) by mouth every 6 (six) hours as needed. (Patient not taking: Reported on 03/08/2023) 30 tablet 0 Not Taking   DULoxetine (CYMBALTA) 60 MG capsule Take 60 mg by mouth daily. (Patient not taking: Reported on 03/08/2023)   Not Taking    Family History  Problem Relation Age of Onset   Healthy Mother    Cancer Father    Review of Systems:   Review of Systems  Constitutional:  Positive for malaise/fatigue. Negative for fever.  Respiratory:  Positive for shortness of breath.   Cardiovascular:  Positive for leg swelling. Negative for chest pain.       Physical Exam: BP (!) 143/63 (BP Location: Left Arm)   Pulse 90   Temp 98.5 F (36.9 C) (Oral)   Resp 16   Ht 5' 11 (1.803 m)   Wt 77.2 kg   SpO2 96%   BMI 23.75 kg/m  Neuro: Alert and oriented x 3, no focal deficits HEENT: No obvious dental issue Cardiac: Regular rate and rhythm.  Systolic and diastolic murmurs at right upper sternal border, high-pitched murmur apex Lungs diminished breath sounds bibasilar Abdomen soft nontender Right forearm splint Trace edema both lower extremities  Diagnostic Studies & Radiology Findings:  CLINICAL DATA:  Cough.   EXAM: PORTABLE CHEST 1 VIEW   COMPARISON:  12/20/2021.   FINDINGS: There are increased interstitial markings with lower lobe predominance and several Kerley B-lines, favoring congestive heart failure/pulmonary edema. Bilateral lung fields are otherwise clear. Bilateral costophrenic angles are clear.   Normal cardio-mediastinal silhouette, considering AP technique.   No acute osseous abnormalities.   The soft tissues are within normal limits.   IMPRESSION: *Findings favor mild congestive heart failure/pulmonary edema.     Electronically Signed   By: Ree Molt M.D.   On: 03/08/2023 08:13  CLINICAL DATA:  Recent surgery. Cough. Concern for pulmonary embolism.   EXAM: CT ANGIOGRAPHY CHEST WITH CONTRAST  TECHNIQUE: Multidetector CT imaging of the  chest was performed using the standard protocol during bolus administration of intravenous contrast. Multiplanar CT image reconstructions and MIPs were obtained to evaluate the vascular anatomy.   RADIATION DOSE REDUCTION: This exam was performed according to the departmental dose-optimization program which includes automated exposure control, adjustment of the mA and/or kV according to patient size and/or use of iterative reconstruction technique.   CONTRAST:  75mL OMNIPAQUE IOHEXOL 350 MG/ML SOLN   COMPARISON:  Chest radiograph dated 03/08/2023.   FINDINGS: Evaluation of this exam is limited due to respiratory motion.   Cardiovascular: There is no cardiomegaly or pericardial effusion. There is coronary vascular calcification. Mild atherosclerotic calcification of the thoracic aorta. No aneurysmal dilatation or dissection. The origins of the great vessels of the aortic arch appear patent. Evaluation of the pulmonary arteries is limited due to severe respiratory motion and suboptimal opacification and timing of the contrast. No large or central pulmonary artery embolus identified.   Mediastinum/Nodes: No hilar or mediastinal adenopathy. The esophagus is grossly unremarkable. No mediastinal fluid collection.   Lungs/Pleura: Moderate bilateral pleural effusions with partial compressive atelectasis of the lower lobes. There is diffuse interstitial and interlobular septal prominence suggestive of edema. Areas of airspace ground-glass density in the right upper lobe may represent edema, although pneumonia is not excluded. No pneumothorax. The central airways are patent.   Upper Abdomen: Partially visualized left moderate hydronephrosis versus parapelvic cyst. CT abdomen pelvis or renal ultrasound may provide better evaluation.   Musculoskeletal: No acute osseous pathology.   Review of the MIP images confirms the above findings.   IMPRESSION: 1. No CT evidence of large or  central pulmonary artery embolus. 2. Findings of CHF with moderate bilateral pleural effusions. Superimposed pneumonia is not excluded. 3. Partially visualized left moderate hydronephrosis versus parapelvic cyst. CT abdomen pelvis or renal ultrasound may provide better evaluation. 4.  Aortic Atherosclerosis (ICD10-I70.0).     Electronically Signed   By: Elgie Collard M.D.   On: 03/08/2023 12:03  ECHOCARDIOGRAM REPORT       Patient Name:   BRANKO ALLOR [facility] Date of Exam: 03/09/2023  Medical Rec #:  [id number]         Height:       71.0 in  Accession #:    [id number]        Weight:       173.5 lb  Date of Birth:  Jul 30, 1960         BSA:          1.985 m  Patient Age:    62 years          BP:           132/68 mmHg  Patient Gender: M                 HR:           93 bpm.  [facility]   Procedure: 2D Echo, Cardiac Doppler and Color Doppler   Indications:    CHF-Acute Diastolic I50.31    History:        Patient has no prior history of Echocardiogram  examinations.                 Risk Factors:Hypertension. Alcohol abuse.    Sonographer:    Celesta Gentile RCS  Referring Phys: [phone] Gradyn TAT   IMPRESSIONS     1. The aortic valve is abnormal. There is a linear shaped  echodensity,  measuring 2.0 x 0.35 cm, attached to the non-coronary cusp of aortic valve  leaflet with motion independent of the aortic valve concerning for  infective endocarditis. Best appreciated   in PLAX, SAX and A5C views. Aortic valve regurgitation is severe,  centrally directed. Aortic regurgitation PHT measures 130 msec. Suboptimal  SSN images, possibly has diastolic flow reversal in the descending aorta.  Mild aortic valve stenosis. Aortic  valve mean gradient measures 13.0 mmHg. Aortic valve Vmax measures 2.50  m/s. Strongly consider TEE for further evaluation.   2. Left ventricular ejection fraction, by estimation, is >75%. Left  ventricular ejection fraction by PLAX is 84 %. The left  ventricle has  hyperdynamic function. The left ventricle has no regional wall motion  abnormalities. There is mild asymmetric left  ventricular hypertrophy of the basal and septal segments. Left ventricular  diastolic parameters are indeterminate. Elevated left ventricular  end-diastolic pressure.   3. Right ventricular systolic function is normal. The right ventricular  size is normal. Tricuspid regurgitation signal is inadequate for assessing  PA pressure.   4. Left atrial size was severely dilated.   5. The mitral valve is normal in structure. Trivial mitral valve  regurgitation. No evidence of mitral stenosis.   6. The inferior vena cava is dilated in size with >50% respiratory  variability, suggesting right atrial pressure of 8 mmHg.   7. Increased flow velocities may be secondary to anemia, thyrotoxicosis,  hyperdynamic or high flow state.   Comparison(s): No prior Echocardiogram.   FINDINGS   Left Ventricle: Left ventricular ejection fraction, by estimation, is  >75%. Left ventricular ejection fraction by PLAX is 84 %. The left  ventricle has hyperdynamic function. The left ventricle has no regional  wall motion abnormalities. The left  ventricular internal cavity size was normal in size. There is mild  asymmetric left ventricular hypertrophy of the basal and septal segments.  Left ventricular diastolic parameters are indeterminate. Elevated left  ventricular end-diastolic pressure.   Right Ventricle: The right ventricular size is normal. No increase in  right ventricular wall thickness. Right ventricular systolic function is  normal. Tricuspid regurgitation signal is inadequate for assessing PA  pressure.   Left Atrium: Left atrial size was severely dilated.   Right Atrium: Right atrial size was normal in size.   Pericardium: There is no evidence of pericardial effusion.   Mitral Valve: The mitral valve is normal in structure. Trivial mitral  valve regurgitation. No  evidence of mitral valve stenosis.   Tricuspid Valve: The tricuspid valve is normal in structure. Tricuspid  valve regurgitation is not demonstrated. No evidence of tricuspid  stenosis.   Aortic Valve: The aortic valve is abnormal. Aortic valve regurgitation is  severe. Aortic regurgitation PHT measures 130 msec. Mild aortic stenosis  is present. Aortic valve mean gradient measures 13.0 mmHg. Aortic valve  peak gradient measures 25.0 mmHg.  Aortic valve area, by VTI measures 2.07 cm.   Pulmonic Valve: The pulmonic valve was grossly normal. Pulmonic valve  regurgitation is not visualized. No evidence of pulmonic stenosis.   Aorta: The aortic root is normal in size and structure.   Venous: The inferior vena cava is dilated in size with greater than 50%  respiratory variability, suggesting right atrial pressure of 8 mmHg.   IAS/Shunts: No atrial level shunt detected by color flow Doppler.     LEFT VENTRICLE  PLAX 2D  LV EF:         Left  Diastology                 ventricular     LV e' medial:    8.16 cm/s                 ejection        LV E/e' medial:  21.8                 fraction by     LV e' lateral:   6.85 cm/s                 PLAX is 84      LV E/e' lateral: 26.0                 %.  LVIDd:         5.40 cm  LVIDs:         2.50 cm  LV PW:         1.10 cm  LV IVS:        1.00 cm  LVOT diam:     2.00 cm  LV SV:         104  LV SV Index:   52  LVOT Area:     3.14 cm     RIGHT VENTRICLE  RV S prime:     17.30 cm/s  TAPSE (M-mode): 3.6 cm   LEFT ATRIUM              Index        RIGHT ATRIUM           Index  LA diam:        4.40 cm  2.22 cm/m   RA Area:     15.50 cm  LA Vol (A2C):   81.6 ml  41.11 ml/m  RA Volume:   38.40 ml  19.35 ml/m  LA Vol (A4C):   116.0 ml 58.44 ml/m  LA Biplane Vol: 99.5 ml  50.13 ml/m   AORTIC VALVE  AV Area (Vmax):    2.24 cm  AV Area (Vmean):   2.25 cm  AV Area (VTI):     2.07 cm  AV Vmax:           250.00 cm/s  AV  Vmean:          166.000 cm/s  AV VTI:            0.500 m  AV Peak Grad:      25.0 mmHg  AV Mean Grad:      13.0 mmHg  LVOT Vmax:         178.00 cm/s  LVOT Vmean:        119.000 cm/s  LVOT VTI:          0.330 m  LVOT/AV VTI ratio: 0.66  AI PHT:            130 msec    AORTA  Ao Root diam: 3.60 cm   MITRAL VALVE  MV Area (PHT): 3.31 cm     SHUNTS  MV Decel Time: 229 msec     Systemic VTI:  0.33 m  MR Peak grad: 66.3 mmHg     Systemic Diam: 2.00 cm  MR Mean grad: 43.0 mmHg  MR Vmax:      407.00 cm/s  MR Vmean:     304.0 cm/s  MV E velocity: 178.00 cm/s  MV A velocity: 106.00 cm/s  MV E/A ratio:  1.68  Vishnu Priya Mallipeddi  Electronically signed by Diannah Late Mallipeddi  Signature Date/Time: 03/09/2023/7:30:08 PM      Final       Con JAYSON Helm, PA-C  Assessment & Plan: 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  He presented with shortness of breath and was found to have acute on chronic diastolic heart failure.  Workup included transthoracic echocardiogram which showed aortic and mitral valve vegetations and regurgitation.  Blood cultures are positive for a Streptococcus species.  He had a transesophageal echocardiogram yesterday which showed ejection fraction of 60%.  There was a 6 x 8 mm vegetation on the aortic valve with severe aortic regurgitation and a 4 x 7 mm mitral valve vegetation with a flail A1/A2 segment with likely leaflet perforation and severe mitral regurgitation.  He is afebrile although he never really had fever as part of his presentation.  Complicating factors include history of left nephrectomy and heavy ethanol use.  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  On vancomycin  and ceftriaxone .  Afebrile with normal white count, although that was the case prior to admission.  Small vegetations noted on TEE but significant valvular destruction and severe AI and MR.  Will  need AVR and MVR.  Given his age the preferred option would be mechanical AVR and MVR.  That would require lifelong anticoagulation with Coumadin .  He does have a significant ethanol history, which could be a complicating factor.  Tissue valves are an option but would likely need redo surgery within a relatively short timeframe.  He will give those options consideration.  CT angio chest did show some coronary calcification.  He needs his coronary arteries evaluated.  He needs a formal cardiology consultation.  Would defer to them whether to do catheterization or coronary CT with FFR.  Will order orthopantogram.  Has not seen a dentist in a while.  Will see if we can obtain a dental/oral surgery consult while he is in the hospital.  He was quite taken aback after being informed that he needed heart surgery.  He will need a little time to process that information.  Tentative plan would be surgery either Friday or Monday.  Elspeth MOTE Kerrin, MD [facility] [phone]

## 2023-03-13 NOTE — H&P (View-Only) (Signed)
 Cardiology Consultation   Patient ID: Thomas [name] MRN: [id number]; DOB: 04-09-1960  Admit date: 03/08/2023 Date of Consult: 03/13/2023  PCP:  Thomas Rush, MD   [facility] Providers Cardiologist:  New to [facility] - Dr. Wonda     Patient Profile:   Thomas [name] is a 63 y.o. male with a hx of hypertension, history of left nephrectomy (around age 74 due to kidney stones?), remote substance abuse (cocaine, snort, never injection, last use around 2010), anxiety, GERD and thrombocytopenia who is being seen 03/13/2023 for the cardiovascular evaluation prior to upcoming valve surgery at the request of Dr. Kerrin.  History of Present Illness:   Thomas [name] is a pleasant 63 year old male was past medical history of hypertension, history of left nephrectomy (around age 34 due to kidney stones?), remote substance abuse (cocaine, snort, never injection, last use around 2010), anxiety, GERD and thrombocytopenia.  Patient underwent right wrist ulnar shortening osteotomy in August 2024, he later required a right wrist ulnar shortening osteotomy plate removal and bone biopsy with cultures of distal ulnar in December due to hardware failure and osteolysis.  Culture was negative at the time.  He underwent right ulnar shaft nonunion repair with plate fixation and right distal radial ulnar joint stabilization the pinning on February 27, 2023.  Since his wrist surgery, he has been having intermittent fever up to 102 at home.  He also developed increasing shortness of breath, fatigue and worsening leg edema.  He denies any exertional chest pain.  He lives in [facility] [facility] and has been able to climb stairs recently without any exertional chest pain.    He eventually sought medical attention at the ED on 03/07/2022.  CT of the chest showed bilateral pleural effusion with evidence of congestive heart failure, there was also coronary calcification mainly in the LAD territory.  BNP was 211.   Troponin borderline elevated.  Platelet 146.  Patient was treated for acute diastolic heart failure.  TTE on 03/08/2022 showed EF greater than 75%, severe AI with linear shaped echodensity measuring 2 cm x 0.35 cm concerning for endocarditis.  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  EF was 60 to 65%.  Blood culture came back positive for Streptococcus species.  Patient was started on empiric IV antibiotic.  Dr. Kerrin has seen the patient and discussed either mechanical valve versus bioprosthetic valve.  Patient has opted to proceed with mechanical valve.  Cardiology service consulted for cardiovascular evaluation prior to valve surgery.   Past Medical History:  Diagnosis Date   Blood dyscrasia    GERD (gastroesophageal reflux disease)    History of kidney stones    Hypertension    Thrombocytopenia (HCC) 04/12/2016    Past Surgical History:  Procedure Laterality Date   BONE BIOPSY Right 02/09/2023   Procedure: BONE BIOPSY;  Surgeon: Arlinda Buster, MD;  Location: [facility];  Service: Orthopedics;  Laterality: Right;   HARDWARE REMOVAL Right 02/09/2023   Procedure: RIGHT FOREARM HARDWARE REMOVAL;  Surgeon: Arlinda Buster, MD;  Location: [facility];  Service: Orthopedics;  Laterality: Right;   IRRIGATION AND DEBRIDEMENT ELBOW Right 02/09/2023   Procedure: RIGHT FOREARM IRRIGATION AND DEBRIDEMENT;  Surgeon: Arlinda Buster, MD;  Location: [facility];  Service: Orthopedics;  Laterality: Right;   ORIF ULNAR FRACTURE Right 02/27/2023   Procedure: RIGHT OPEN REDUCTION INTERNAL FIXATION (ORIF) ULNAR FRACTURE;  Surgeon: Arlinda Buster, MD;  Location: [facility];  Service: Orthopedics;  Laterality: Right;   renalectomy Left    TRANSESOPHAGEAL ECHOCARDIOGRAM (CATH LAB) N/A 03/12/2023   Procedure: TRANSESOPHAGEAL ECHOCARDIOGRAM;  Surgeon: Barbaraann Darryle Ned, MD;  Location: [facility] INVASIVE CV [facility];  Service: Cardiovascular;  Laterality: N/A;   WRIST OSTEOTOMY Right 10/24/2022   Procedure: RIGHT WRIST ULNAR SHORTENING OSTEOTOMY;  Surgeon: Arlinda Buster, MD;  Location: [facility];  Service: Orthopedics;  Laterality: Right;  regional block     Home Medications:  Prior to Admission medications   Medication Sig Start Date End Date Taking? Authorizing Provider  acetaminophen  (TYLENOL ) 650 MG CR tablet Take 1,300 mg by mouth every 8 (eight) hours as needed for pain.   Yes [provider]  ALPRAZolam  (XANAX ) 1 MG tablet Take 1 mg by mouth at bedtime as needed for anxiety.   Yes [provider]  ibuprofen  (ADVIL ) 800 MG tablet Take 1 tablet (800 mg total) by mouth 3 (three) times daily. Take with food Patient taking differently: Take 200 mg by mouth every 8 (eight) hours as needed for fever, headache or mild pain (pain score 1-3). Take with food 05/09/19  Yes Avegno, Komlanvi S, FNP  olmesartan (BENICAR) 40 MG tablet Take 40 mg by mouth daily.   Yes [provider]  omeprazole (PRILOSEC OTC) 20 MG tablet Take 20 mg by mouth daily.   Yes [provider]  oxyCODONE  (ROXICODONE ) 5 MG immediate release tablet Take 1 tablet (5 mg total) by mouth every 6 (six) hours as needed. Patient not taking: Reported on 03/08/2023 02/26/23   Agarwala, Buster, MD  DULoxetine (CYMBALTA) 60 MG capsule Take 60 mg by mouth daily. Patient not taking: Reported on 03/08/2023    [provider]    Inpatient Medications: Scheduled Meds:  enoxaparin  (LOVENOX ) injection  40 mg Subcutaneous Q24H   feeding supplement  237 mL Oral BID BM   feeding supplement  237 mL Oral BID BM   folic acid   1 mg Oral Daily   furosemide   40 mg Oral Daily   multivitamin with minerals  1 tablet Oral Daily   spironolactone   12.5 mg Oral Daily   thiamine   100 mg Oral Daily   Or   thiamine   100 mg Intravenous Daily   Continuous Infusions:   cefTRIAXone  (ROCEPHIN )  IV 2 g (03/12/23 1458)   vancomycin  Stopped (03/13/23 0101)   PRN Meds: acetaminophen , diphenhydrAMINE , fentaNYL  (SUBLIMAZE ) injection, HYDROcodone -acetaminophen , LORazepam , methocarbamol , ondansetron  (ZOFRAN ) IV  Allergies:   No Known Allergies  Social History:   Social History   Socioeconomic History   Marital status: Single    Spouse name: Not on file   Number of children: Not on file   Years of education: Not on file   Highest education level: Not on file  Occupational History   Not on file  Tobacco Use   Smoking status: Never   Smokeless tobacco: Never  Substance and Sexual Activity   Alcohol use: Yes    Comment: occasionally   Drug use: No   Sexual activity: Yes  Other Topics Concern   Not on file  Social History Narrative   Not on file   Social Drivers of Health   Financial Resource Strain: Not on file  Food Insecurity: No Food Insecurity (03/10/2023)   Hunger Vital Sign    Worried About Running Out of Food in the Last Year: Never true    Ran Out of Food in the Last Year: Never true  Transportation Needs: No Transportation Needs (03/10/2023)   PRAPARE - Administrator, Civil Service (Medical): No    Lack of Transportation (Non-Medical): No  Physical Activity: Not on file  Stress: Not on file  Social Connections: Socially Integrated (03/10/2023)   Social Connection and Isolation Panel [NHANES]    Frequency of Communication with Friends and Family: Twice a week    Frequency of Social Gatherings with Friends and Family: Twice a week    Attends Religious Services: 1 to 4 times per year    Active Member of [employer] or Organizations: Yes    Attends Engineer, Structural: More than 4 times per year    Marital Status: Living with partner  Intimate Partner Violence: Not At Risk (03/10/2023)   Humiliation, Afraid, Rape, and Kick questionnaire    Fear of Current or Ex-Partner: No    Emotionally Abused: No    Physically Abused:  No    Sexually Abused: No    Family History:    Family History  Problem Relation Age of Onset   Healthy Mother    Cancer Father      ROS:  Please see the history of present illness.   All other ROS reviewed and negative.     Physical Exam/Data:   Vitals:   03/12/23 1726 03/12/23 2012 03/13/23 0321 03/13/23 0500  BP: (!) 119/52 127/62 (!) 143/63   Pulse: 94 95 90   Resp: 16 18 16    Temp: 99 F (37.2 C) 98.5 F (36.9 C) 98.5 F (36.9 C)   TempSrc: Oral Oral Oral   SpO2: 98% 97% 96%   Weight:    77.2 kg  Height:        Intake/Output Summary (Last 24 hours) at 03/13/2023 1015 Last data filed at 03/13/2023 0944 Gross per 24 hour  Intake 2104.32 ml  Output 100 ml  Net 2004.32 ml      03/13/2023    5:00 AM 03/12/2023    3:13 AM 03/11/2023    6:33 AM  Last 3 Weights  Weight (lbs) 170 lb 4.8 oz 170 lb 8 oz 172 lb 6.4 oz  Weight (kg) 77.248 kg 77.338 kg 78.2 kg     Body mass index is 23.75 kg/m.  General:  Well nourished, well developed, in no acute distress HEENT: normal Neck: no JVD Vascular: No carotid bruits; Distal pulses 2+ bilaterally Cardiac:  normal S1, S2; RRR; no murmur  Lungs:  clear to auscultation bilaterally, no wheezing, rhonchi or rales  Abd: soft, nontender, no hepatomegaly  Ext: no edema Musculoskeletal:  No deformities, BUE and BLE strength normal and equal Skin: warm and dry  Neuro:  CNs 2-12 intact, no focal abnormalities noted Psych:  Normal affect   EKG:  The EKG was personally reviewed and demonstrates: Sinus tachycardia, no significant ST-T wave changes. Telemetry:  Telemetry was personally reviewed and demonstrates: Normal sinus rhythm, heart rate in the 80s.  Relevant CV Studies:  Echo 03/09/2023  1. The aortic valve is abnormal. There is a linear shaped echodensity,  measuring 2.0 x 0.35 cm, attached to the non-coronary cusp of aortic valve  leaflet with motion independent of the aortic valve concerning for  infective endocarditis.  Best appreciated   in PLAX, SAX and A5C views. Aortic valve regurgitation is severe,  centrally directed. Aortic regurgitation PHT measures 130 msec. Suboptimal  SSN images, possibly has diastolic flow reversal in the descending aorta.  Mild aortic valve stenosis. Aortic  valve mean gradient  measures 13.0 mmHg. Aortic valve Vmax measures 2.50  m/s. Strongly consider TEE for further evaluation.   2. Left ventricular ejection fraction, by estimation, is >75%. Left  ventricular ejection fraction by PLAX is 84 %. The left ventricle has  hyperdynamic function. The left ventricle has no regional wall motion  abnormalities. There is mild asymmetric left  ventricular hypertrophy of the basal and septal segments. Left ventricular  diastolic parameters are indeterminate. Elevated left ventricular  end-diastolic pressure.   3. Right ventricular systolic function is normal. The right ventricular  size is normal. Tricuspid regurgitation signal is inadequate for assessing  PA pressure.   4. Left atrial size was severely dilated.   5. The mitral valve is normal in structure. Trivial mitral valve  regurgitation. No evidence of mitral stenosis.   6. The inferior vena cava is dilated in size with >50% respiratory  variability, suggesting right atrial pressure of 8 mmHg.   7. Increased flow velocities may be secondary to anemia, thyrotoxicosis,  hyperdynamic or high flow state.   Comparison(s): No prior Echocardiogram.    TEE 03/12/2023  1. Aortic valve vegetation noted 0.6 cm x 0.8 cm located in the  commissure between the NCC/LCC. There is severe aortic regurgitation that  is eccentric and likely leaflet perforation. There is holodiastolic flow  reversal in the descending aorta  consistent with severe AI. 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 Moderate stenosis, likely flow related in  setting of severe AI. The aortic valve is tricuspid. There is moderate  calcification  of the aortic valve. There is  moderate thickening of the aortic valve. Aortic valve regurgitation is  severe. Moderate aortic valve stenosis. Aortic regurgitation PHT measures  393 msec. Aortic valve area, by VTI measures 2.69 cm. Aortic valve mean  gradient measures 22.0 mmHg. Aortic  valve Vmax measures 2.99 m/s.   2. Severe eccentric mitral regurgitation that is posteriorly directed.  Vegetation noted on the A3 segment that measures 0.4 cm x 0.7 cm. There is  also a flail A1/A2 segment with likely leaflet perforation. Findings  consistent with endocarditis and  significant valvular destruction. Systolic blunting in all 4 pulmonary  veins. The mitral valve is abnormal. Severe mitral valve regurgitation. No  evidence of mitral stenosis.   3. Left ventricular ejection fraction, by estimation, is 60 to 65%. The  left ventricle has normal function.   4. Right ventricular systolic function is normal. The right ventricular  size is normal.   5. Left atrial size was mildly dilated. No left atrial/left atrial  appendage thrombus was detected. The LAA emptying velocity was 54 cm/s.   6. There is mild (Grade II) layered plaque involving the descending  aorta.   7. Agitated saline contrast bubble study was positive with shunting  observed after >6 cardiac cycles suggestive of intrapulmonary shunting.   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    Laboratory Data:  High Sensitivity Troponin:   Recent Labs  Lab 03/08/23 0811 03/08/23 1217  TROPONINIHS 27* 27*     Chemistry Recent Labs  Lab 03/09/23 0412 03/09/23 1032 03/10/23 0358 03/11/23 0347 03/12/23 0349 03/13/23 0419  NA  --    < > 133* 134* 130* 132*  K  --    < > 3.6 3.9 4.0 3.9  CL  --    < > 105 105 103 105  CO2  --    < > 24 22 20* 20*  GLUCOSE  --    < >  108* 103* 90 100*  BUN  --    < > 11 10 11 11   CREATININE  --    < > 0.73 0.86 0.79 0.70   CALCIUM   --    < > 8.3* 8.4* 8.3* 8.2*  MG 1.7  --  1.8 2.0  --   --   GFRNONAA  --    < > >60 >60 >60 >60  ANIONGAP  --    < > 4* 7 7 7    < > = values in this interval not displayed.    Recent Labs  Lab 03/08/23 0811  PROT 7.0  ALBUMIN  2.5*  AST 79*  ALT 34  ALKPHOS 96  BILITOT 0.8   Lipids No results for input(s): CHOL, TRIG, HDL, LABVLDL, LDLCALC, CHOLHDL in the last 168 hours.  Hematology Recent Labs  Lab 03/11/23 0347 03/12/23 0349 03/13/23 0419  WBC 7.1 6.4 6.5  RBC 3.34* 3.28* 3.32*  HGB 9.4* 9.3* 9.4*  HCT 29.2* 29.0* 28.8*  MCV 87.4 88.4 86.7  MCH 28.1 28.4 28.3  MCHC 32.2 32.1 32.6  RDW 16.6* 16.2* 16.1*  PLT 144* 133* 138*   Thyroid  Recent Labs  Lab 03/08/23 1217 03/09/23 0412  TSH 3.267  --   FREET4  --  0.88    BNP Recent Labs  Lab 03/08/23 0811  BNP 211.0*    DDimer No results for input(s): DDIMER in the last 168 hours.   Radiology/Studies:  ECHO TEE Result Date: 03/12/2023    TRANSESOPHOGEAL ECHO REPORT   Patient Name:   [name] [facility] Date of Exam: 03/12/2023 Medical Rec #:  [id number]         Height:       71.0 in Accession #:    [id number]        Weight:       170.5 lb Date of Birth:  [birth year]         BSA:          1.970 m Patient Age:    62 years          BP:           147/58 mmHg Patient Gender: M                 HR:           100 bpm. Exam Location:  Inpatient Procedure: Transesophageal Echo, 3D Echo, Color Doppler and Cardiac Doppler Indications:     Endocarditis  History:         Patient has prior history of Echocardiogram examinations, most                  recent 03/09/2023. CHF; Risk Factors:Hypertension.  Sonographer:     Damien Senior RDCS Referring Phys:  [id number] ROLLO JONELLE LOUDER Diagnosing Phys: Darryle Decent MD PROCEDURE: After discussion of the risks and benefits of a TEE, an informed consent was obtained from the patient. TEE procedure time was 22 minutes. The transesophogeal probe was passed without difficulty  through the esophogus of the patient. Imaged were obtained with the patient in a left lateral decubitus position. Local oropharyngeal anesthetic was provided with Cetacaine. Sedation performed by different physician. The patient was monitored while under deep sedation. Anesthestetic sedation was provided intravenously by Anesthesiology: 487mg  of Propofol , 80mg  of Lidocaine . Image quality was excellent. The patient developed no complications during the procedure.  IMPRESSIONS  1. Aortic valve vegetation noted 0.6 cm x 0.8 cm located in the commissure  between the NCC/LCC. There is severe aortic regurgitation that is eccentric and likely leaflet perforation. There is holodiastolic flow reversal in the descending aorta consistent with severe AI. 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 Moderate stenosis, likely flow related in setting of severe AI. The aortic valve is tricuspid. There is moderate calcification of the aortic valve. There is moderate thickening of the aortic valve. Aortic valve regurgitation is severe. Moderate aortic valve stenosis. Aortic regurgitation PHT measures 393 msec. Aortic valve area, by VTI measures 2.69 cm. Aortic valve mean gradient measures 22.0 mmHg. Aortic valve Vmax measures 2.99 m/s.  2. Severe eccentric mitral regurgitation that is posteriorly directed. Vegetation noted on the A3 segment that measures 0.4 cm x 0.7 cm. There is also a flail A1/A2 segment with likely leaflet perforation. Findings consistent with endocarditis and significant valvular destruction. Systolic blunting in all 4 pulmonary veins. The mitral valve is abnormal. Severe mitral valve regurgitation. No evidence of mitral stenosis.  3. Left ventricular ejection fraction, by estimation, is 60 to 65%. The left ventricle has normal function.  4. Right ventricular systolic function is normal. The right ventricular size is normal.  5. Left atrial size was mildly dilated. No left atrial/left atrial  appendage thrombus was detected. The LAA emptying velocity was 54 cm/s.  6. There is mild (Grade II) layered plaque involving the descending aorta.  7. Agitated saline contrast bubble study was positive with shunting observed after >6 cardiac cycles suggestive of intrapulmonary shunting. Conclusion(s)/Recommendation(s): Findings are concerning for vegetation/infective endocarditis as detailed above. FINDINGS  Left Ventricle: Left ventricular ejection fraction, by estimation, is 60 to 65%. The left ventricle has normal function. The left ventricular internal cavity size was normal in size. Right Ventricle: The right ventricular size is normal. No increase in right ventricular wall thickness. Right ventricular systolic function is normal. Left Atrium: Left atrial size was mildly dilated. No left atrial/left atrial appendage thrombus was detected. The LAA emptying velocity was 54 cm/s. Right Atrium: Right atrial size was normal in size. Pericardium: There is no evidence of pericardial effusion. Mitral Valve: Severe eccentric mitral regurgitation that is posteriorly directed. Vegetation noted on the A3 segment that measures 0.4 cm x 0.7 cm. There is also a flail A1/A2 segment with likely leaflet perforation. Findings consistent with endocarditis  and significant valvular destruction. Systolic blunting in all 4 pulmonary veins. The mitral valve is abnormal. Severe mitral valve regurgitation. No evidence of mitral valve stenosis. MV peak gradient, 12.4 mmHg. The mean mitral valve gradient is 5.0 mmHg. Tricuspid Valve: The tricuspid valve is grossly normal. Tricuspid valve regurgitation is trivial. No evidence of tricuspid stenosis. There is no evidence of tricuspid valve vegetation. Aortic Valve: Aortic valve vegetation noted 0.6 cm x 0.8 cm located in the commissure between the NCC/LCC. There is severe aortic regurgitation that is eccentric and likely leaflet perforation. There is holodiastolic flow reversal in the  descending aorta  consistent with severe AI. 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 Moderate stenosis, likely flow related in setting of severe AI. The aortic valve is tricuspid. There is moderate calcification of the aortic valve. There is moderate thickening of the aortic valve. Aortic valve regurgitation is severe. Aortic regurgitation PHT measures 393 msec. Moderate aortic stenosis is present. Aortic valve mean gradient measures 22.0 mmHg. Aortic valve peak gradient measures 35.8 mmHg. Aortic valve area, by VTI measures 2.69 cm. Pulmonic Valve: The pulmonic valve was grossly normal. Pulmonic valve regurgitation is trivial. No evidence of  pulmonic stenosis. There is no evidence of pulmonic valve vegetation. Aorta: The aortic root and ascending aorta are structurally normal, with no evidence of dilitation. There is mild (Grade II) layered plaque involving the descending aorta. Venous: A systolic blunting flow pattern is recorded from the left upper pulmonary vein, the left lower pulmonary vein, the right upper pulmonary vein and the right lower pulmonary vein. IAS/Shunts: The atrial septum is grossly normal. Agitated saline contrast was given intravenously to evaluate for intracardiac shunting. Agitated saline contrast bubble study was positive with shunting observed after >6 cardiac cycles suggestive of intrapulmonary shunting. Additional Comments: Spectral Doppler performed. LEFT VENTRICLE PLAX 2D LVOT diam:     2.30 cm LV SV:         155 LV SV Index:   79 LVOT Area:     4.15 cm  AORTIC VALVE AV Area (Vmax):    2.75 cm AV Area (Vmean):   2.61 cm AV Area (VTI):     2.69 cm AV Vmax:           299.00 cm/s AV Vmean:          223.000 cm/s AV VTI:            0.578 m AV Peak Grad:      35.8 mmHg AV Mean Grad:      22.0 mmHg LVOT Vmax:         198.00 cm/s LVOT Vmean:        140.000 cm/s LVOT VTI:          0.374 m LVOT/AV VTI ratio: 0.65 AI PHT:            393 msec  AORTA Ao Root diam:  3.26 cm Ao Asc diam:  3.30 cm MITRAL VALVE MV Area VTI:  4.51 cm        SHUNTS MV Peak grad: 12.4 mmHg       Systemic VTI:  0.37 m MV Mean grad: 5.0 mmHg        Systemic Diam: 2.30 cm MV Vmax:      1.76 m/s MV Vmean:     103.0 cm/s MR Peak grad:    104.4 mmHg MR Mean grad:    72.0 mmHg MR Vmax:         511.00 cm/s MR Vmean:        403.0 cm/s MR PISA:         5.09 cm MR PISA Eff ROA: 35 mm MR PISA Radius:  0.90 cm Darryle Decent MD Electronically signed by Darryle Decent MD Signature Date/Time: 03/12/2023/12:34:06 PM    Final    EP STUDY Result Date: 03/12/2023 See surgical note for result.  DG Wrist 2 Views Right Result Date: 03/10/2023 CLINICAL DATA:  Right wrist pain. EXAM: RIGHT WRIST - 2 VIEW COMPARISON:  Right wrist x-rays dated February 05, 2023. FINDINGS: Postsurgical changes related to interval revision ORIF of the distal ulnar fracture and pinning of the distal radioulnar joint. No acute fracture or dislocation. Old healed distal radius fracture with chronic dorsal angulation. Chronic avulsion fracture of the ulnar styloid process again noted. Joint spaces are preserved. Bone mineralization is normal. Soft tissue swelling of the distal forearm. IMPRESSION: 1. Interval revision ORIF of the distal ulnar fracture and pinning of the distal radioulnar joint. No acute osseous abnormality. 2. Old distal radius and ulnar styloid process fractures. Electronically Signed   By: Elsie ONEIDA Shoulder M.D.   On: 03/10/2023 12:34   DG Forearm Right Result Date:  03/10/2023 CLINICAL DATA:  RIGHT wrist pain EXAM: RIGHT FOREARM - 2 VIEW COMPARISON:  02/05/2023 FINDINGS: Interval hardware revision with replacement of dynamic fixation plate along the distal ulna. Improved alignment. K-wire extends through the distal ulna into the radial metaphysis. IMPRESSION: No acute findings.  Interval hardware revision as above. Electronically Signed   By: Jackquline Boxer M.D.   On: 03/10/2023 12:31   ECHOCARDIOGRAM COMPLETE Result  Date: 03/09/2023    ECHOCARDIOGRAM REPORT   Patient Name:   [name] [facility] Date of Exam: 03/09/2023 Medical Rec #:  [id number]         Height:       71.0 in Accession #:    [id number]        Weight:       173.5 lb Date of Birth:  04/21/1960         BSA:          1.985 m Patient Age:    62 years          BP:           132/68 mmHg Patient Gender: M                 HR:           93 bpm. Exam Location:  [facility] Procedure: 2D Echo, Cardiac Doppler and Color Doppler Indications:    CHF-Acute Diastolic I50.31  History:        Patient has no prior history of Echocardiogram examinations.                 Risk Factors:Hypertension. Alcohol abuse.  Sonographer:    Aida Pizza RCS Referring Phys: [phone] [place] TAT IMPRESSIONS  1. The aortic valve is abnormal. There is a linear shaped echodensity, measuring 2.0 x 0.35 cm, attached to the non-coronary cusp of aortic valve leaflet with motion independent of the aortic valve concerning for infective endocarditis. Best appreciated  in PLAX, SAX and A5C views. Aortic valve regurgitation is severe, centrally directed. Aortic regurgitation PHT measures 130 msec. Suboptimal SSN images, possibly has diastolic flow reversal in the descending aorta. Mild aortic valve stenosis. Aortic valve mean gradient measures 13.0 mmHg. Aortic valve Vmax measures 2.50 m/s. Strongly consider TEE for further evaluation.  2. Left ventricular ejection fraction, by estimation, is >75%. Left ventricular ejection fraction by PLAX is 84 %. The left ventricle has hyperdynamic function. The left ventricle has no regional wall motion abnormalities. There is mild asymmetric left ventricular hypertrophy of the basal and septal segments. Left ventricular diastolic parameters are indeterminate. Elevated left ventricular end-diastolic pressure.  3. Right ventricular systolic function is normal. The right ventricular size is normal. Tricuspid regurgitation signal is inadequate for assessing PA pressure.  4. Left  atrial size was severely dilated.  5. The mitral valve is normal in structure. Trivial mitral valve regurgitation. No evidence of mitral stenosis.  6. The inferior vena cava is dilated in size with >50% respiratory variability, suggesting right atrial pressure of 8 mmHg.  7. Increased flow velocities may be secondary to anemia, thyrotoxicosis, hyperdynamic or high flow state. Comparison(s): No prior Echocardiogram. FINDINGS  Left Ventricle: Left ventricular ejection fraction, by estimation, is >75%. Left ventricular ejection fraction by PLAX is 84 %. The left ventricle has hyperdynamic function. The left ventricle has no regional wall motion abnormalities. The left ventricular internal cavity size was normal in size. There is mild asymmetric left ventricular hypertrophy of the basal and septal segments. Left ventricular diastolic parameters  are indeterminate. Elevated left ventricular end-diastolic pressure. Right Ventricle: The right ventricular size is normal. No increase in right ventricular wall thickness. Right ventricular systolic function is normal. Tricuspid regurgitation signal is inadequate for assessing PA pressure. Left Atrium: Left atrial size was severely dilated. Right Atrium: Right atrial size was normal in size. Pericardium: There is no evidence of pericardial effusion. Mitral Valve: The mitral valve is normal in structure. Trivial mitral valve regurgitation. No evidence of mitral valve stenosis. Tricuspid Valve: The tricuspid valve is normal in structure. Tricuspid valve regurgitation is not demonstrated. No evidence of tricuspid stenosis. Aortic Valve: The aortic valve is abnormal. Aortic valve regurgitation is severe. Aortic regurgitation PHT measures 130 msec. Mild aortic stenosis is present. Aortic valve mean gradient measures 13.0 mmHg. Aortic valve peak gradient measures 25.0 mmHg. Aortic valve area, by VTI measures 2.07 cm. Pulmonic Valve: The pulmonic valve was grossly normal. Pulmonic  valve regurgitation is not visualized. No evidence of pulmonic stenosis. Aorta: The aortic root is normal in size and structure. Venous: The inferior vena cava is dilated in size with greater than 50% respiratory variability, suggesting right atrial pressure of 8 mmHg. IAS/Shunts: No atrial level shunt detected by color flow Doppler.  LEFT VENTRICLE PLAX 2D LV EF:         Left            Diastology                ventricular     LV e' medial:    8.16 cm/s                ejection        LV E/e' medial:  21.8                fraction by     LV e' lateral:   6.85 cm/s                PLAX is 84      LV E/e' lateral: 26.0                %. LVIDd:         5.40 cm LVIDs:         2.50 cm LV PW:         1.10 cm LV IVS:        1.00 cm LVOT diam:     2.00 cm LV SV:         104 LV SV Index:   52 LVOT Area:     3.14 cm  RIGHT VENTRICLE RV S prime:     17.30 cm/s TAPSE (M-mode): 3.6 cm LEFT ATRIUM              Index        RIGHT ATRIUM           Index LA diam:        4.40 cm  2.22 cm/m   RA Area:     15.50 cm LA Vol (A2C):   81.6 ml  41.11 ml/m  RA Volume:   38.40 ml  19.35 ml/m LA Vol (A4C):   116.0 ml 58.44 ml/m LA Biplane Vol: 99.5 ml  50.13 ml/m  AORTIC VALVE AV Area (Vmax):    2.24 cm AV Area (Vmean):   2.25 cm AV Area (VTI):     2.07 cm AV Vmax:           250.00 cm/s AV Vmean:  166.000 cm/s AV VTI:            0.500 m AV Peak Grad:      25.0 mmHg AV Mean Grad:      13.0 mmHg LVOT Vmax:         178.00 cm/s LVOT Vmean:        119.000 cm/s LVOT VTI:          0.330 m LVOT/AV VTI ratio: 0.66 AI PHT:            130 msec  AORTA Ao Root diam: 3.60 cm MITRAL VALVE MV Area (PHT): 3.31 cm     SHUNTS MV Decel Time: 229 msec     Systemic VTI:  0.33 m MR Peak grad: 66.3 mmHg     Systemic Diam: 2.00 cm MR Mean grad: 43.0 mmHg MR Vmax:      407.00 cm/s MR Vmean:     304.0 cm/s MV E velocity: 178.00 cm/s MV A velocity: 106.00 cm/s MV E/A ratio:  1.68 Vishnu Priya Mallipeddi Electronically signed by Diannah Late Mallipeddi  Signature Date/Time: 03/09/2023/7:30:08 PM    Final      Assessment and Plan:   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  Culture was negative at the time.  Patient complained of intermittent fever at home up to 102.  He has been having worsening shortness of breath since wrist surgery.  He presented to the hospital with heart failure symptoms.  Initial TTE suggestive of normal EF but evidence of aortic endocarditis.  Subsequent TEE confirmed both severe AI and severe MR with endocarditis involving both valves.  -Seen by Dr. Kerrin will discuss with the patient mechanical versus bioprosthetic valve replacement surgery either this Friday or next Monday.  Per patient, he has decided to proceed with mechanical valve replacement.  -Cardiology service consulted for preoperative cardiac evaluation.  Patient denies any exertional chest pain.  CT of the chest showed coronary calcification in the LAD territory.  Will discuss with MD regarding definitive evaluation using cardiac catheterization.  -Risk and benefit of procedure explained to the patient who display clear understanding and agree to proceed. Discussed with patient possible procedural risk include bleeding, vascular injury, renal injury, arrythmia, MI, stroke and loss of limb or life.  Acute diastolic heart failure: Euvolemic at this point.  Last dose of IV Lasix  on 1/12, patient has been transition to oral Lasix .  Patient appears to be euvolemic on exam.  Normal EF on echo.  Hypertension: Home olmesartan stopped, currently on low-dose spironolactone .  Remote history of substance abuse: Remote use of cocaine, snorted, never injected.  Last used 2010.  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  Due to complication from kidney stone.  Thrombocytopenia: Platelet 138.   Risk Assessment/Risk Scores:        [place]  Heart  Association (NYHA) Functional Class NYHA Class III        For questions or updates, please contact [facility] Please consult [email or web address] for contact info under    Bonney Scot Ford, [place]  03/13/2023 10:15 AM  Patient seen, examined. Available data reviewed. Agree with findings, assessment, and plan as outlined by Scot Ford, PA. Pt independently interviewed and examined. Alert, oriented male in NAD. HEENT normal, lungs clear, heart RRR with 3/6 harsh holosystolic murmur at the apex and LLSB, abdomen soft and NT, extremities with no edema. Right arm  in ACE wrap. Pt with progressive symptoms of heart failure and febrile illness found to have aortic and mitral valve endocarditis with plans for double valve replacement per Dr Kerrin. He appears well-compensated from a heart failure standpoint after IV diuretic Rx. TEE images reviewed and agree. Cultures have grown strep species.  Continue oral lasix  and spiro. Avoid aggressive GDMT with pending cardiac surgery. Pt needs preoperative coronary evaluation, noted to have coronary calcification on CT of the chest. He should undergo catheter angiography for definitive evaluation. I have reviewed the risks, indications, and alternatives to cardiac catheterization, possible angioplasty, and stenting with the patient. Risks include but are not limited to bleeding, infection, vascular injury, stroke, myocardial infection, arrhythmia, kidney injury, radiation-related injury in the case of prolonged fluoroscopy use, emergency cardiac surgery, and death. The patient understands the risks of serious complication is 1-2 in 1000 with diagnostic cardiac cath. He understands there is some increase in risk of embolic event in the setting of aortic valve vegetation, but caution will be taken to avoid crossing the aortic valve. He provides full informed consent for the procedure. He has 2+ left radial pulse should be appropriate for vascular access.  Otherwise, as  outlined above.   Ozell Fell, M.D. 03/13/2023 10:51 AM

## 2023-03-13 NOTE — Progress Notes (Signed)
 Progress Note   Patient: Thomas [name] FMW:[id number] DOB: 1960-11-07 DOA: 03/08/2023     5 DOS: the patient was seen and examined on 03/13/2023   Brief hospital course: [name] was admitted to the hospital with the working diagnosis of heart failure decompensation.   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 In the ED, the patient was afebrile hemodynamically stable with oxygen saturation 96% room air.  WBC 3.5, hemoglobin 7.2, platelets 146.  Sodium 135, potassium 3.2, bicarbonate 22, serum creatinine 0.68.  AST 79, ALT 34, alk phosphatase 96, total bilirubin 0.8.  BNP 211.  Troponin 27.  Chest x-ray showed increased vascular markings and interstitial markings.  CTA chest was negative for PE.  It showed bilateral pleural effusions with diffuse interstitial and interlobular septal thickening.   The patient was started on IV furosemide .  He was admitted for further evaluation and treatment of his fluid overload.  Echocardiogram with aortic valve vegetation.   01/11 transfer to [facility] for [facility] care.  01/13 TEE with severe AI with aortic valve vegetation and severe MR with mitral valve vegetation, likely perforation and flail segment.  01/14 cardiac catheterization in preparation for valvular intervention on Friday.   Assessment and Plan: * Acute on chronic diastolic CHF (congestive heart failure) (HCC) Echocardiogram with preserved LV systolic function with EF >75%, hyperdynamic LV, mild asymmetric LVH of the basal and septal segments. RV systolic function preserved, LA with severe dilatation, severe aortic valve regurgitation, mild aortic stenosis, linear shaped echodensity measuring 2.0 x 0,35 cm attached to the non coronary cusp of the aortic valve leaflet with motion  independent of the aortic valve.   Continue with oral furosemide  and spironolactone .  If blood pressure stable will add ARB.   Acute bacterial endocarditis Positive blood cultures gram positive cocci.  TEE with severe AI with aortic valve vegetation, severe MR with with mitral valve vegetation, likely perforation flail segment.   Continue antibiotic with vancomycin  and ceftriaxone .  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   Alcohol abuse No signs of acute alcohol withdrawal.  Will hold on CIWA protocol and will add as needed po lorazepam .   Closed fracture of shaft of right ulna with delayed healing Continue pain control with hydrocodone  and for severe pain fentanyl .  Follow up with orthopedics.  No apparent post operative local infection.   Essential hypertension Continue blood pressure monitoring.   Thrombocytopenia (HCC) Pancytopenia has resolved.  Anemia of chronic disease.  Cell count has been stable.         Subjective: Patient's right arm pain is improved with analgesics, he has no dyspnea or chest pain, no PND or orthopnea   Physical Exam: Vitals:   03/13/23 1705 03/13/23 1710 03/13/23 1715 03/13/23 1720  BP: (!) 141/51 (!) 146/49 (!) 147/49 (!) 158/55  Pulse: 91 91 88 91  Resp: 20 (!) 23 (!) 24 (!) 27  Temp:      TempSrc:      SpO2: 95% 97% 98% 97%  Weight:      Height:       Neurology awake and alert ENT with mild pallor Cardiovascular with S1 and S2 present and regular, positive diastolic murmur at the base, with systolic murmur at the apex,  No JVD No lower extremity edema Respiratory with no  rales or wheezing, no rhonchi Abdomen with no distention  No lower extremity edema Right arm with splint in place   Data Reviewed:    Family Communication: no family   Disposition: Status is: Inpatient Remains inpatient appropriate because: cardiac surgery   Planned Discharge Destination:  Home      Author: Elidia Toribio Furnace, MD 03/13/2023 5:51 PM  For on call review [email or web address].

## 2023-03-13 NOTE — Interval H&P Note (Signed)
 History and Physical Interval Note:  03/13/2023 4:18 PM  Thomas [name]  has presented today for surgery, with the diagnosis of nstemi.  The various methods of treatment have been discussed with the patient and family. After consideration of risks, benefits and other options for treatment, the patient has consented to  Procedure(s): CORONARY ANGIOGRAPHY (N/A) as a surgical intervention.  The patient's history has been reviewed, patient examined, no change in status, stable for surgery.  I have reviewed the patient's chart and labs.  Questions were answered to the patient's satisfaction.     Alm Clay

## 2023-03-13 NOTE — Consult Note (Signed)
 Reason for Consult: Dental   Referring Physician: [name] is an 63 y.o. male.  CC: No dental pain or issues.     Past Medical History:  Diagnosis Date   Blood dyscrasia    GERD (gastroesophageal reflux disease)    History of kidney stones    Hypertension    Thrombocytopenia (HCC) 04/12/2016    Past Surgical History:  Procedure Laterality Date   BONE BIOPSY Right 02/09/2023   Procedure: BONE BIOPSY;  Surgeon: Thomas Buster, MD;  Location: [facility];  Service: Orthopedics;  Laterality: Right;   HARDWARE REMOVAL Right 02/09/2023   Procedure: RIGHT FOREARM HARDWARE REMOVAL;  Surgeon: Thomas Buster, MD;  Location: [facility];  Service: Orthopedics;  Laterality: Right;   IRRIGATION AND DEBRIDEMENT ELBOW Right 02/09/2023   Procedure: RIGHT FOREARM IRRIGATION AND DEBRIDEMENT;  Surgeon: Thomas Buster, MD;  Location: [facility];  Service: Orthopedics;  Laterality: Right;   ORIF ULNAR FRACTURE Right 02/27/2023   Procedure: RIGHT OPEN REDUCTION INTERNAL FIXATION (ORIF) ULNAR FRACTURE;  Surgeon: Thomas Buster, MD;  Location: [facility];  Service: Orthopedics;  Laterality: Right;   renalectomy Left    TRANSESOPHAGEAL ECHOCARDIOGRAM (CATH LAB) N/A 03/12/2023   Procedure: TRANSESOPHAGEAL ECHOCARDIOGRAM;  Surgeon: Thomas Darryle Ned, MD;  Location: [facility];  Service: Cardiovascular;  Laterality: N/A;   WRIST OSTEOTOMY Right 10/24/2022   Procedure: RIGHT WRIST ULNAR SHORTENING OSTEOTOMY;  Surgeon: Thomas Buster, MD;  Location: [facility];  Service: Orthopedics;  Laterality: Right;  regional block    Family History  Problem Relation Age of Onset   Healthy Mother    Cancer Father     Social History:  reports that he has never smoked. He has never used smokeless tobacco. He reports current alcohol use. He reports that he does not use drugs.  Allergies: No Known  Allergies  Medications: I have reviewed the patient's current medications.  Results for orders placed or performed during the hospital encounter of 03/08/23 (from the past 48 hours)  Culture, blood (Routine X 2) w Reflex to ID Panel     Status: None (Preliminary result)   Collection Time: 03/11/23  8:53 PM   Specimen: BLOOD  Result Value Ref Range   Specimen Description BLOOD BLOOD LEFT HAND    Special Requests      BOTTLES DRAWN AEROBIC AND ANAEROBIC Blood Culture results may not be optimal due to an inadequate volume of blood received in culture bottles   Culture      NO GROWTH 2 DAYS Performed at [facility], [address], [place], [place] [address]    Report Status PENDING   Culture, blood (Routine X 2) w Reflex to ID Panel     Status: None (Preliminary result)   Collection Time: 03/11/23  8:53 PM   Specimen: BLOOD  Result Value Ref Range   Specimen Description BLOOD BLOOD LEFT ARM    Special Requests      BOTTLES DRAWN AEROBIC AND ANAEROBIC Blood Culture results may not be optimal due to an inadequate volume of blood received in culture bottles   Culture      NO GROWTH 2 DAYS Performed at [facility], [address] [place], [place] [address]    Report Status PENDING   Basic metabolic panel     Status: Abnormal   Collection Time: 03/12/23  3:49 AM  Result Value Ref Range   Sodium 130 (L)  135 - 145 mmol/L   Potassium 4.0 3.5 - 5.1 mmol/L   Chloride 103 98 - 111 mmol/L   CO2 20 (L) 22 - 32 mmol/L   Glucose, Bld 90 70 - 99 mg/dL    Comment: Glucose reference range applies only to samples taken after fasting for at least 8 hours.   BUN 11 8 - 23 mg/dL   Creatinine, Ser 9.20 0.61 - 1.24 mg/dL   Calcium  8.3 (L) 8.9 - 10.3 mg/dL   GFR, Estimated >39 >39 mL/min    Comment: (NOTE) Calculated using the CKD-EPI Creatinine Equation (2021)    Anion gap 7 5 - 15    Comment: Performed at [facility], [address] [place], [place] [address]  CBC      Status: Abnormal   Collection Time: 03/12/23  3:49 AM  Result Value Ref Range   WBC 6.4 4.0 - 10.5 K/uL   RBC 3.28 (L) 4.22 - 5.81 MIL/uL   Hemoglobin 9.3 (L) 13.0 - 17.0 g/dL   HCT 70.9 (L) 60.9 - 47.9 %   MCV 88.4 80.0 - 100.0 fL   MCH 28.4 26.0 - 34.0 pg   MCHC 32.1 30.0 - 36.0 g/dL   RDW 83.7 (H) 88.4 - 84.4 %   Platelets 133 (L) 150 - 400 K/uL   nRBC 0.0 0.0 - 0.2 %    Comment: Performed at [facility], [address] [place], [place] [address]  Basic metabolic panel     Status: Abnormal   Collection Time: 03/13/23  4:19 AM  Result Value Ref Range   Sodium 132 (L) 135 - 145 mmol/L   Potassium 3.9 3.5 - 5.1 mmol/L   Chloride 105 98 - 111 mmol/L   CO2 20 (L) 22 - 32 mmol/L   Glucose, Bld 100 (H) 70 - 99 mg/dL    Comment: Glucose reference range applies only to samples taken after fasting for at least 8 hours.   BUN 11 8 - 23 mg/dL   Creatinine, Ser 9.29 0.61 - 1.24 mg/dL   Calcium  8.2 (L) 8.9 - 10.3 mg/dL   GFR, Estimated >39 >39 mL/min    Comment: (NOTE) Calculated using the CKD-EPI Creatinine Equation (2021)    Anion gap 7 5 - 15    Comment: Performed at [facility], [address], [place], [place] [address]  CBC     Status: Abnormal   Collection Time: 03/13/23  4:19 AM  Result Value Ref Range   WBC 6.5 4.0 - 10.5 K/uL   RBC 3.32 (L) 4.22 - 5.81 MIL/uL   Hemoglobin 9.4 (L) 13.0 - 17.0 g/dL   HCT 71.1 (L) 60.9 - 47.9 %   MCV 86.7 80.0 - 100.0 fL   MCH 28.3 26.0 - 34.0 pg   MCHC 32.6 30.0 - 36.0 g/dL   RDW 83.8 (H) 88.4 - 84.4 %   Platelets 138 (L) 150 - 400 K/uL   nRBC 0.0 0.0 - 0.2 %    Comment: Performed at [facility], [address] [place], [place] [address]    DG Orthopantogram Result Date: 03/13/2023 CLINICAL DATA:  Aortic insufficiency.  Endocarditis. EXAM: ORTHOPANTOGRAM/PANORAMIC COMPARISON:  None Available. FINDINGS: No fracture or definite lytic abnormality is seen involving the mandible. IMPRESSION: No significant abnormality  seen. Electronically Signed   By: Thomas [name] M.D.   On: 03/13/2023 14:19   ECHO TEE Result Date: 03/12/2023    TRANSESOPHOGEAL ECHO REPORT  Patient Name:   [name] [facility] Date of Exam: 03/12/2023 Medical Rec #:  [id number]         Height:       71.0 in Accession #:    [id number]        Weight:       170.5 lb Date of Birth:  10-05-1960         BSA:          1.970 m Patient Age:    62 years          BP:           147/58 mmHg Patient Gender: M                 HR:           100 bpm. Exam Location:  Inpatient Procedure: Transesophageal Echo, 3D Echo, Color Doppler and Cardiac Doppler Indications:     Endocarditis  History:         Patient has prior history of Echocardiogram examinations, most                  recent 03/09/2023. CHF; Risk Factors:Hypertension.  Sonographer:     Damien Senior RDCS Referring Phys:  [id number] ROLLO JONELLE LOUDER Diagnosing Phys: Darryle Decent MD PROCEDURE: After discussion of the risks and benefits of a TEE, an informed consent was obtained from the patient. TEE procedure time was 22 minutes. The transesophogeal probe was passed without difficulty through the esophogus of the patient. Imaged were obtained with the patient in a left lateral decubitus position. Local oropharyngeal anesthetic was provided with Cetacaine. Sedation performed by different physician. The patient was monitored while under deep sedation. Anesthestetic sedation was provided intravenously by Anesthesiology: 487mg  of Propofol , 80mg  of Lidocaine . Image quality was excellent. The patient developed no complications during the procedure.  IMPRESSIONS  1. Aortic valve vegetation noted 0.6 cm x 0.8 cm located in the commissure between the NCC/LCC. There is severe aortic regurgitation that is eccentric and likely leaflet perforation. There is holodiastolic flow reversal in the descending aorta consistent with severe AI. 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 Moderate stenosis, likely flow  related in setting of severe AI. The aortic valve is tricuspid. There is moderate calcification of the aortic valve. There is moderate thickening of the aortic valve. Aortic valve regurgitation is severe. Moderate aortic valve stenosis. Aortic regurgitation PHT measures 393 msec. Aortic valve area, by VTI measures 2.69 cm. Aortic valve mean gradient measures 22.0 mmHg. Aortic valve Vmax measures 2.99 m/s.  2. Severe eccentric mitral regurgitation that is posteriorly directed. Vegetation noted on the A3 segment that measures 0.4 cm x 0.7 cm. There is also a flail A1/A2 segment with likely leaflet perforation. Findings consistent with endocarditis and significant valvular destruction. Systolic blunting in all 4 pulmonary veins. The mitral valve is abnormal. Severe mitral valve regurgitation. No evidence of mitral stenosis.  3. Left ventricular ejection fraction, by estimation, is 60 to 65%. The left ventricle has normal function.  4. Right ventricular systolic function is normal. The right ventricular size is normal.  5. Left atrial size was mildly dilated. No left atrial/left atrial appendage thrombus was detected. The LAA emptying velocity was 54 cm/s.  6. There is mild (Grade II) layered plaque involving the descending aorta.  7. Agitated saline contrast bubble study was positive with shunting observed after >6 cardiac cycles suggestive of intrapulmonary shunting. Conclusion(s)/Recommendation(s): Findings are concerning for vegetation/infective endocarditis as detailed above.  FINDINGS  Left Ventricle: Left ventricular ejection fraction, by estimation, is 60 to 65%. The left ventricle has normal function. The left ventricular internal cavity size was normal in size. Right Ventricle: The right ventricular size is normal. No increase in right ventricular wall thickness. Right ventricular systolic function is normal. Left Atrium: Left atrial size was mildly dilated. No left atrial/left atrial appendage thrombus was  detected. The LAA emptying velocity was 54 cm/s. Right Atrium: Right atrial size was normal in size. Pericardium: There is no evidence of pericardial effusion. Mitral Valve: Severe eccentric mitral regurgitation that is posteriorly directed. Vegetation noted on the A3 segment that measures 0.4 cm x 0.7 cm. There is also a flail A1/A2 segment with likely leaflet perforation. Findings consistent with endocarditis  and significant valvular destruction. Systolic blunting in all 4 pulmonary veins. The mitral valve is abnormal. Severe mitral valve regurgitation. No evidence of mitral valve stenosis. MV peak gradient, 12.4 mmHg. The mean mitral valve gradient is 5.0 mmHg. Tricuspid Valve: The tricuspid valve is grossly normal. Tricuspid valve regurgitation is trivial. No evidence of tricuspid stenosis. There is no evidence of tricuspid valve vegetation. Aortic Valve: Aortic valve vegetation noted 0.6 cm x 0.8 cm located in the commissure between the NCC/LCC. There is severe aortic regurgitation that is eccentric and likely leaflet perforation. There is holodiastolic flow reversal in the descending aorta  consistent with severe AI. 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 Moderate stenosis, likely flow related in setting of severe AI. The aortic valve is tricuspid. There is moderate calcification of the aortic valve. There is moderate thickening of the aortic valve. Aortic valve regurgitation is severe. Aortic regurgitation PHT measures 393 msec. Moderate aortic stenosis is present. Aortic valve mean gradient measures 22.0 mmHg. Aortic valve peak gradient measures 35.8 mmHg. Aortic valve area, by VTI measures 2.69 cm. Pulmonic Valve: The pulmonic valve was grossly normal. Pulmonic valve regurgitation is trivial. No evidence of pulmonic stenosis. There is no evidence of pulmonic valve vegetation. Aorta: The aortic root and ascending aorta are structurally normal, with no evidence of dilitation.  There is mild (Grade II) layered plaque involving the descending aorta. Venous: A systolic blunting flow pattern is recorded from the left upper pulmonary vein, the left lower pulmonary vein, the right upper pulmonary vein and the right lower pulmonary vein. IAS/Shunts: The atrial septum is grossly normal. Agitated saline contrast was given intravenously to evaluate for intracardiac shunting. Agitated saline contrast bubble study was positive with shunting observed after >6 cardiac cycles suggestive of intrapulmonary shunting. Additional Comments: Spectral Doppler performed. LEFT VENTRICLE PLAX 2D LVOT diam:     2.30 cm LV SV:         155 LV SV Index:   79 LVOT Area:     4.15 cm  AORTIC VALVE AV Area (Vmax):    2.75 cm AV Area (Vmean):   2.61 cm AV Area (VTI):     2.69 cm AV Vmax:           299.00 cm/s AV Vmean:          223.000 cm/s AV VTI:            0.578 m AV Peak Grad:      35.8 mmHg AV Mean Grad:      22.0 mmHg LVOT Vmax:         198.00 cm/s LVOT Vmean:        140.000 cm/s LVOT VTI:  0.374 m LVOT/AV VTI ratio: 0.65 AI PHT:            393 msec  AORTA Ao Root diam: 3.26 cm Ao Asc diam:  3.30 cm MITRAL VALVE MV Area VTI:  4.51 cm        SHUNTS MV Peak grad: 12.4 mmHg       Systemic VTI:  0.37 m MV Mean grad: 5.0 mmHg        Systemic Diam: 2.30 cm MV Vmax:      1.76 m/s MV Vmean:     103.0 cm/s MR Peak grad:    104.4 mmHg MR Mean grad:    72.0 mmHg MR Vmax:         511.00 cm/s MR Vmean:        403.0 cm/s MR PISA:         5.09 cm MR PISA Eff ROA: 35 mm MR PISA Radius:  0.90 cm Darryle Decent MD Electronically signed by Darryle Decent MD Signature Date/Time: 03/12/2023/12:34:06 PM    Final    EP STUDY Result Date: 03/12/2023 See surgical note for result.   ROS Blood pressure (!) 143/63, pulse 90, temperature 98.5 F (36.9 C), temperature source Oral, resp. rate 16, height 5' 11 (1.803 m), weight 77.2 kg, SpO2 96%. General appearance: alert, cooperative, and no distress Head: Normocephalic,  without obvious abnormality, atraumatic Eyes: negative Nose: Nares normal. Septum midline. Mucosa normal. No drainage or sinus tenderness. Throat: Healthy dentition without caries, mobility, or tenderness. Large bilateral mandibular lingual tori present. Normal opening. Pharynx clear.  Neck: no adenopathy  Assessment/Plan: No pre-surgical dental intervention necessary. Follow up with general dentist for routine check-ups.   Glendia Primrose 03/13/2023, 2:55 PM

## 2023-03-13 NOTE — Consult Note (Addendum)
 Cardiology Consultation   Thomas [name] ID: [name] MRN: [id number]; DOB: 04-09-1960  Admit date: 03/08/2023 Date of Consult: 03/13/2023  PCP:  Marvine Rush, MD   [facility] Providers Cardiologist:  New to [facility] - Dr. Wonda     Thomas [name] Profile:   Thomas [name] is a 63 y.o. male with a hx of hypertension, history of left nephrectomy (around age 74 due to kidney stones?), remote substance abuse (cocaine, snort, never injection, last use around 2010), anxiety, GERD and thrombocytopenia who is being seen 03/13/2023 for Thomas cardiovascular evaluation prior to upcoming valve surgery at Thomas request of Dr. Kerrin.  History of Present Illness:   Thomas [name] is a pleasant 63 year old male was past medical history of hypertension, history of left nephrectomy (around age 34 due to kidney stones?), remote substance abuse (cocaine, snort, never injection, last use around 2010), anxiety, GERD and thrombocytopenia.  Thomas [name] underwent right wrist ulnar shortening osteotomy in August 2024, he later required a right wrist ulnar shortening osteotomy plate removal and bone biopsy with cultures of distal ulnar in December due to hardware failure and osteolysis.  Culture was negative at Thomas time.  He underwent right ulnar shaft nonunion repair with plate fixation and right distal radial ulnar joint stabilization Thomas pinning on February 27, 2023.  Since his wrist surgery, he has been having intermittent fever up to 102 at home.  He also developed increasing shortness of breath, fatigue and worsening leg edema.  He denies any exertional chest pain.  He lives in [facility] and has been able to climb stairs recently without any exertional chest pain.    He eventually sought medical attention at Thomas ED on 03/07/2022.  CT of Thomas chest showed bilateral pleural effusion with evidence of congestive heart failure, there was also coronary calcification mainly in Thomas LAD territory.  BNP was 211.   Troponin borderline elevated.  Platelet 146.  Thomas [name] was treated for acute diastolic heart failure.  TTE on 03/08/2022 showed EF greater than 75%, severe AI with linear shaped echodensity measuring 2 cm x 0.35 cm concerning for endocarditis.  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  EF was 60 to 65%.  Blood culture came back positive for Streptococcus species.  Thomas [name] was started on empiric IV antibiotic.  Dr. Kerrin has seen Thomas [name] and discussed either mechanical valve versus bioprosthetic valve.  Thomas [name] has opted to proceed with mechanical valve.  Cardiology service consulted for cardiovascular evaluation prior to valve surgery.   Past Medical History:  Diagnosis Date   Blood dyscrasia    GERD (gastroesophageal reflux disease)    History of kidney stones    Hypertension    Thrombocytopenia (HCC) 04/12/2016    Past Surgical History:  Procedure Laterality Date   BONE BIOPSY Right 02/09/2023   Procedure: BONE BIOPSY;  Surgeon: Arlinda Buster, MD;  Location: [facility];  Service: Orthopedics;  Laterality: Right;   HARDWARE REMOVAL Right 02/09/2023   Procedure: RIGHT FOREARM HARDWARE REMOVAL;  Surgeon: Arlinda Buster, MD;  Location: [facility];  Service: Orthopedics;  Laterality: Right;   IRRIGATION AND DEBRIDEMENT ELBOW Right 02/09/2023   Procedure: RIGHT FOREARM IRRIGATION AND DEBRIDEMENT;  Surgeon: Arlinda Buster, MD;  Location: [facility];  Service: Orthopedics;  Laterality: Right;   ORIF ULNAR FRACTURE Right 02/27/2023   Procedure: RIGHT OPEN REDUCTION INTERNAL FIXATION (ORIF) ULNAR FRACTURE;  Surgeon: Arlinda Buster, MD;  Location: [facility];  Service: Orthopedics;  Laterality: Right;   renalectomy Left    TRANSESOPHAGEAL ECHOCARDIOGRAM (CATH LAB) N/A 03/12/2023   Procedure: TRANSESOPHAGEAL ECHOCARDIOGRAM;  Surgeon: Barbaraann Darryle Ned, MD;  Location: [facility] INVASIVE CV [facility];  Service: Cardiovascular;  Laterality: N/A;   WRIST OSTEOTOMY Right 10/24/2022   Procedure: RIGHT WRIST ULNAR SHORTENING OSTEOTOMY;  Surgeon: Arlinda Buster, MD;  Location: [facility];  Service: Orthopedics;  Laterality: Right;  regional block     Home Medications:  Prior to Admission medications   Medication Sig Start Date End Date Taking? Authorizing Provider  acetaminophen  (TYLENOL ) 650 MG CR tablet Take 1,300 mg by mouth every 8 (eight) hours as needed for pain.   Yes [provider]  ALPRAZolam  (XANAX ) 1 MG tablet Take 1 mg by mouth at bedtime as needed for anxiety.   Yes [provider]  ibuprofen  (ADVIL ) 800 MG tablet Take 1 tablet (800 mg total) by mouth 3 (three) times daily. Take with food Thomas [name] taking differently: Take 200 mg by mouth every 8 (eight) hours as needed for fever, headache or mild pain (pain score 1-3). Take with food 05/09/19  Yes Avegno, Komlanvi S, FNP  olmesartan (BENICAR) 40 MG tablet Take 40 mg by mouth daily.   Yes [provider]  omeprazole (PRILOSEC OTC) 20 MG tablet Take 20 mg by mouth daily.   Yes [provider]  oxyCODONE  (ROXICODONE ) 5 MG immediate release tablet Take 1 tablet (5 mg total) by mouth every 6 (six) hours as needed. Thomas [name] not taking: Reported on 03/08/2023 02/26/23   Agarwala, Buster, MD  DULoxetine (CYMBALTA) 60 MG capsule Take 60 mg by mouth daily. Thomas [name] not taking: Reported on 03/08/2023    [provider]    Inpatient Medications: Scheduled Meds:  enoxaparin  (LOVENOX ) injection  40 mg Subcutaneous Q24H   feeding supplement  237 mL Oral BID BM   feeding supplement  237 mL Oral BID BM   folic acid   1 mg Oral Daily   furosemide   40 mg Oral Daily   multivitamin with minerals  1 tablet Oral Daily   spironolactone   12.5 mg Oral Daily   thiamine   100 mg Oral Daily   Or   thiamine   100 mg Intravenous Daily   Continuous Infusions:   cefTRIAXone  (ROCEPHIN )  IV 2 g (03/12/23 1458)   vancomycin  Stopped (03/13/23 0101)   PRN Meds: acetaminophen , diphenhydrAMINE , fentaNYL  (SUBLIMAZE ) injection, HYDROcodone -acetaminophen , LORazepam , methocarbamol , ondansetron  (ZOFRAN ) IV  Allergies:   No Known Allergies  Social History:   Social History   Socioeconomic History   Marital status: Single    Spouse name: Not on file   Number of children: Not on file   Years of education: Not on file   Highest education level: Not on file  Occupational History   Not on file  Tobacco Use   Smoking status: Never   Smokeless tobacco: Never  Substance and Sexual Activity   Alcohol use: Yes    Comment: occasionally   Drug use: No   Sexual activity: Yes  Other Topics Concern   Not on file  Social History Narrative   Not on file   Social Drivers of Health   Financial Resource Strain: Not on file  Food Insecurity: No Food Insecurity (03/10/2023)   Hunger Vital Sign    Worried About Running Out of Food in Thomas Last Year: Never true    Ran Out of Food in Thomas Last Year: Never true  Transportation Needs: No Transportation Needs (03/10/2023)   PRAPARE - Administrator, Civil Service (Medical): No    Lack of Transportation (Non-Medical): No  Physical Activity: Not on file  Stress: Not on file  Social Connections: Socially Integrated (03/10/2023)   Social Connection and Isolation Panel [NHANES]    Frequency of Communication with Friends and Family: Twice a week    Frequency of Social Gatherings with Friends and Family: Twice a week    Attends Religious Services: 1 to 4 times per year    Active Member of [employer] or Organizations: Yes    Attends Engineer, Structural: More than 4 times per year    Marital Status: Living with partner  Intimate Partner Violence: Not At Risk (03/10/2023)   Humiliation, Afraid, Rape, and Kick questionnaire    Fear of Current or Ex-Partner: No    Emotionally Abused: No    Physically Abused:  No    Sexually Abused: No    Family History:    Family History  Problem Relation Age of Onset   Healthy Mother    Cancer Father      ROS:  Please see Thomas history of present illness.   All other ROS reviewed and negative.     Physical Exam/Data:   Vitals:   03/12/23 1726 03/12/23 2012 03/13/23 0321 03/13/23 0500  BP: (!) 119/52 127/62 (!) 143/63   Pulse: 94 95 90   Resp: 16 18 16    Temp: 99 F (37.2 C) 98.5 F (36.9 C) 98.5 F (36.9 C)   TempSrc: Oral Oral Oral   SpO2: 98% 97% 96%   Weight:    77.2 kg  Height:        Intake/Output Summary (Last 24 hours) at 03/13/2023 1015 Last data filed at 03/13/2023 0944 Gross per 24 hour  Intake 2104.32 ml  Output 100 ml  Net 2004.32 ml      03/13/2023    5:00 AM 03/12/2023    3:13 AM 03/11/2023    6:33 AM  Last 3 Weights  Weight (lbs) 170 lb 4.8 oz 170 lb 8 oz 172 lb 6.4 oz  Weight (kg) 77.248 kg 77.338 kg 78.2 kg     Body mass index is 23.75 kg/m.  General:  Well nourished, well developed, in no acute distress HEENT: normal Neck: no JVD Vascular: No carotid bruits; Distal pulses 2+ bilaterally Cardiac:  normal S1, S2; RRR; no murmur  Lungs:  clear to auscultation bilaterally, no wheezing, rhonchi or rales  Abd: soft, nontender, no hepatomegaly  Ext: no edema Musculoskeletal:  No deformities, BUE and BLE strength normal and equal Skin: warm and dry  Neuro:  CNs 2-12 intact, no focal abnormalities noted Psych:  Normal affect   EKG:  Thomas EKG was personally reviewed and demonstrates: Sinus tachycardia, no significant ST-T wave changes. Telemetry:  Telemetry was personally reviewed and demonstrates: Normal sinus rhythm, heart rate in Thomas 80s.  Relevant CV Studies:  Echo 03/09/2023  1. Thomas aortic valve is abnormal. There is a linear shaped echodensity,  measuring 2.0 x 0.35 cm, attached to Thomas non-coronary cusp of aortic valve  leaflet with motion independent of Thomas aortic valve concerning for  infective endocarditis.  Best appreciated   in PLAX, SAX and A5C views. Aortic valve regurgitation is severe,  centrally directed. Aortic regurgitation PHT measures 130 msec. Suboptimal  SSN images, possibly has diastolic flow reversal in Thomas descending aorta.  Mild aortic valve stenosis. Aortic  valve mean gradient  measures 13.0 mmHg. Aortic valve Vmax measures 2.50  m/s. Strongly consider TEE for further evaluation.   2. Left ventricular ejection fraction, by estimation, is >75%. Left  ventricular ejection fraction by PLAX is 84 %. Thomas left ventricle has  hyperdynamic function. Thomas left ventricle has no regional wall motion  abnormalities. There is mild asymmetric left  ventricular hypertrophy of Thomas basal and septal segments. Left ventricular  diastolic parameters are indeterminate. Elevated left ventricular  end-diastolic pressure.   3. Right ventricular systolic function is normal. Thomas right ventricular  size is normal. Tricuspid regurgitation signal is inadequate for assessing  PA pressure.   4. Left atrial size was severely dilated.   5. Thomas mitral valve is normal in structure. Trivial mitral valve  regurgitation. No evidence of mitral stenosis.   6. Thomas inferior vena cava is dilated in size with >50% respiratory  variability, suggesting right atrial pressure of 8 mmHg.   7. Increased flow velocities may be secondary to anemia, thyrotoxicosis,  hyperdynamic or high flow state.   Comparison(s): No prior Echocardiogram.    TEE 03/12/2023  1. Aortic valve vegetation noted 0.6 cm x 0.8 cm located in Thomas  commissure between Thomas NCC/LCC. There is severe aortic regurgitation that  is eccentric and likely leaflet perforation. There is holodiastolic flow  reversal in Thomas descending aorta  consistent with severe AI. 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 Moderate stenosis, likely flow related in  setting of severe AI. Thomas aortic valve is tricuspid. There is moderate  calcification  of Thomas aortic valve. There is  moderate thickening of Thomas aortic valve. Aortic valve regurgitation is  severe. Moderate aortic valve stenosis. Aortic regurgitation PHT measures  393 msec. Aortic valve area, by VTI measures 2.69 cm. Aortic valve mean  gradient measures 22.0 mmHg. Aortic  valve Vmax measures 2.99 m/s.   2. Severe eccentric mitral regurgitation that is posteriorly directed.  Vegetation noted on Thomas A3 segment that measures 0.4 cm x 0.7 cm. There is  also a flail A1/A2 segment with likely leaflet perforation. Findings  consistent with endocarditis and  significant valvular destruction. Systolic blunting in all 4 pulmonary  veins. Thomas mitral valve is abnormal. Severe mitral valve regurgitation. No  evidence of mitral stenosis.   3. Left ventricular ejection fraction, by estimation, is 60 to 65%. Thomas  left ventricle has normal function.   4. Right ventricular systolic function is normal. Thomas right ventricular  size is normal.   5. Left atrial size was mildly dilated. No left atrial/left atrial  appendage thrombus was detected. Thomas LAA emptying velocity was 54 cm/s.   6. There is mild (Grade II) layered plaque involving Thomas descending  aorta.   7. Agitated saline contrast bubble study was positive with shunting  observed after >6 cardiac cycles suggestive of intrapulmonary shunting.   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    Laboratory Data:  High Sensitivity Troponin:   Recent Labs  Lab 03/08/23 0811 03/08/23 1217  TROPONINIHS 27* 27*     Chemistry Recent Labs  Lab 03/09/23 0412 03/09/23 1032 03/10/23 0358 03/11/23 0347 03/12/23 0349 03/13/23 0419  NA  --    < > 133* 134* 130* 132*  K  --    < > 3.6 3.9 4.0 3.9  CL  --    < > 105 105 103 105  CO2  --    < > 24 22 20* 20*  GLUCOSE  --    < >  108* 103* 90 100*  BUN  --    < > 11 10 11 11   CREATININE  --    < > 0.73 0.86 0.79 0.70   CALCIUM   --    < > 8.3* 8.4* 8.3* 8.2*  MG 1.7  --  1.8 2.0  --   --   GFRNONAA  --    < > >60 >60 >60 >60  ANIONGAP  --    < > 4* 7 7 7    < > = values in this interval not displayed.    Recent Labs  Lab 03/08/23 0811  PROT 7.0  ALBUMIN  2.5*  AST 79*  ALT 34  ALKPHOS 96  BILITOT 0.8   Lipids No results for input(s): CHOL, TRIG, HDL, LABVLDL, LDLCALC, CHOLHDL in Thomas last 168 hours.  Hematology Recent Labs  Lab 03/11/23 0347 03/12/23 0349 03/13/23 0419  WBC 7.1 6.4 6.5  RBC 3.34* 3.28* 3.32*  HGB 9.4* 9.3* 9.4*  HCT 29.2* 29.0* 28.8*  MCV 87.4 88.4 86.7  MCH 28.1 28.4 28.3  MCHC 32.2 32.1 32.6  RDW 16.6* 16.2* 16.1*  PLT 144* 133* 138*   Thyroid  Recent Labs  Lab 03/08/23 1217 03/09/23 0412  TSH 3.267  --   FREET4  --  0.88    BNP Recent Labs  Lab 03/08/23 0811  BNP 211.0*    DDimer No results for input(s): DDIMER in Thomas last 168 hours.   Radiology/Studies:  ECHO TEE Result Date: 03/12/2023    TRANSESOPHOGEAL ECHO REPORT   Thomas [name] Name:   [name] [facility] Date of Exam: 03/12/2023 Medical Rec #:  [id number]         Height:       71.0 in Accession #:    [id number]        Weight:       170.5 lb Date of Birth:  [birth year]         BSA:          1.970 m Thomas [name] Age:    62 years          BP:           147/58 mmHg Thomas [name] Gender: M                 HR:           100 bpm. Exam Location:  Inpatient Procedure: Transesophageal Echo, 3D Echo, Color Doppler and Cardiac Doppler Indications:     Endocarditis  History:         Thomas [name] has prior history of Echocardiogram examinations, most                  recent 03/09/2023. CHF; Risk Factors:Hypertension.  Sonographer:     Damien Senior RDCS Referring Phys:  [id number] ROLLO JONELLE LOUDER Diagnosing Phys: Darryle Decent MD PROCEDURE: After discussion of Thomas risks and benefits of a TEE, an informed consent was obtained from Thomas [name]. TEE procedure time was 22 minutes. Thomas transesophogeal probe was passed without difficulty  through Thomas esophogus of Thomas [name]. Imaged were obtained with Thomas [name] in a left lateral decubitus position. Local oropharyngeal anesthetic was provided with Cetacaine. Sedation performed by different physician. Thomas [name] was monitored while under deep sedation. Anesthestetic sedation was provided intravenously by Anesthesiology: 487mg  of Propofol , 80mg  of Lidocaine . Image quality was excellent. Thomas [name] developed no complications during Thomas procedure.  IMPRESSIONS  1. Aortic valve vegetation noted 0.6 cm x 0.8 cm located in Thomas commissure  between Thomas NCC/LCC. There is severe aortic regurgitation that is eccentric and likely leaflet perforation. There is holodiastolic flow reversal in Thomas descending aorta consistent with severe AI. 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 Moderate stenosis, likely flow related in setting of severe AI. Thomas aortic valve is tricuspid. There is moderate calcification of Thomas aortic valve. There is moderate thickening of Thomas aortic valve. Aortic valve regurgitation is severe. Moderate aortic valve stenosis. Aortic regurgitation PHT measures 393 msec. Aortic valve area, by VTI measures 2.69 cm. Aortic valve mean gradient measures 22.0 mmHg. Aortic valve Vmax measures 2.99 m/s.  2. Severe eccentric mitral regurgitation that is posteriorly directed. Vegetation noted on Thomas A3 segment that measures 0.4 cm x 0.7 cm. There is also a flail A1/A2 segment with likely leaflet perforation. Findings consistent with endocarditis and significant valvular destruction. Systolic blunting in all 4 pulmonary veins. Thomas mitral valve is abnormal. Severe mitral valve regurgitation. No evidence of mitral stenosis.  3. Left ventricular ejection fraction, by estimation, is 60 to 65%. Thomas left ventricle has normal function.  4. Right ventricular systolic function is normal. Thomas right ventricular size is normal.  5. Left atrial size was mildly dilated. No left atrial/left atrial  appendage thrombus was detected. Thomas LAA emptying velocity was 54 cm/s.  6. There is mild (Grade II) layered plaque involving Thomas descending aorta.  7. Agitated saline contrast bubble study was positive with shunting observed after >6 cardiac cycles suggestive of intrapulmonary shunting. Conclusion(s)/Recommendation(s): Findings are concerning for vegetation/infective endocarditis as detailed above. FINDINGS  Left Ventricle: Left ventricular ejection fraction, by estimation, is 60 to 65%. Thomas left ventricle has normal function. Thomas left ventricular internal cavity size was normal in size. Right Ventricle: Thomas right ventricular size is normal. No increase in right ventricular wall thickness. Right ventricular systolic function is normal. Left Atrium: Left atrial size was mildly dilated. No left atrial/left atrial appendage thrombus was detected. Thomas LAA emptying velocity was 54 cm/s. Right Atrium: Right atrial size was normal in size. Pericardium: There is no evidence of pericardial effusion. Mitral Valve: Severe eccentric mitral regurgitation that is posteriorly directed. Vegetation noted on Thomas A3 segment that measures 0.4 cm x 0.7 cm. There is also a flail A1/A2 segment with likely leaflet perforation. Findings consistent with endocarditis  and significant valvular destruction. Systolic blunting in all 4 pulmonary veins. Thomas mitral valve is abnormal. Severe mitral valve regurgitation. No evidence of mitral valve stenosis. MV peak gradient, 12.4 mmHg. Thomas mean mitral valve gradient is 5.0 mmHg. Tricuspid Valve: Thomas tricuspid valve is grossly normal. Tricuspid valve regurgitation is trivial. No evidence of tricuspid stenosis. There is no evidence of tricuspid valve vegetation. Aortic Valve: Aortic valve vegetation noted 0.6 cm x 0.8 cm located in Thomas commissure between Thomas NCC/LCC. There is severe aortic regurgitation that is eccentric and likely leaflet perforation. There is holodiastolic flow reversal in Thomas  descending aorta  consistent with severe AI. 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 Moderate stenosis, likely flow related in setting of severe AI. Thomas aortic valve is tricuspid. There is moderate calcification of Thomas aortic valve. There is moderate thickening of Thomas aortic valve. Aortic valve regurgitation is severe. Aortic regurgitation PHT measures 393 msec. Moderate aortic stenosis is present. Aortic valve mean gradient measures 22.0 mmHg. Aortic valve peak gradient measures 35.8 mmHg. Aortic valve area, by VTI measures 2.69 cm. Pulmonic Valve: Thomas pulmonic valve was grossly normal. Pulmonic valve regurgitation is trivial. No evidence of  pulmonic stenosis. There is no evidence of pulmonic valve vegetation. Aorta: Thomas aortic root and ascending aorta are structurally normal, with no evidence of dilitation. There is mild (Grade II) layered plaque involving Thomas descending aorta. Venous: A systolic blunting flow pattern is recorded from Thomas left upper pulmonary vein, Thomas left lower pulmonary vein, Thomas right upper pulmonary vein and Thomas right lower pulmonary vein. IAS/Shunts: Thomas atrial septum is grossly normal. Agitated saline contrast was given intravenously to evaluate for intracardiac shunting. Agitated saline contrast bubble study was positive with shunting observed after >6 cardiac cycles suggestive of intrapulmonary shunting. Additional Comments: Spectral Doppler performed. LEFT VENTRICLE PLAX 2D LVOT diam:     2.30 cm LV SV:         155 LV SV Index:   79 LVOT Area:     4.15 cm  AORTIC VALVE AV Area (Vmax):    2.75 cm AV Area (Vmean):   2.61 cm AV Area (VTI):     2.69 cm AV Vmax:           299.00 cm/s AV Vmean:          223.000 cm/s AV VTI:            0.578 m AV Peak Grad:      35.8 mmHg AV Mean Grad:      22.0 mmHg LVOT Vmax:         198.00 cm/s LVOT Vmean:        140.000 cm/s LVOT VTI:          0.374 m LVOT/AV VTI ratio: 0.65 AI PHT:            393 msec  AORTA Ao Root diam:  3.26 cm Ao Asc diam:  3.30 cm MITRAL VALVE MV Area VTI:  4.51 cm        SHUNTS MV Peak grad: 12.4 mmHg       Systemic VTI:  0.37 m MV Mean grad: 5.0 mmHg        Systemic Diam: 2.30 cm MV Vmax:      1.76 m/s MV Vmean:     103.0 cm/s MR Peak grad:    104.4 mmHg MR Mean grad:    72.0 mmHg MR Vmax:         511.00 cm/s MR Vmean:        403.0 cm/s MR PISA:         5.09 cm MR PISA Eff ROA: 35 mm MR PISA Radius:  0.90 cm Darryle Decent MD Electronically signed by Darryle Decent MD Signature Date/Time: 03/12/2023/12:34:06 PM    Final    EP STUDY Result Date: 03/12/2023 See surgical note for result.  DG Wrist 2 Views Right Result Date: 03/10/2023 CLINICAL DATA:  Right wrist pain. EXAM: RIGHT WRIST - 2 VIEW COMPARISON:  Right wrist x-rays dated February 05, 2023. FINDINGS: Postsurgical changes related to interval revision ORIF of Thomas distal ulnar fracture and pinning of Thomas distal radioulnar joint. No acute fracture or dislocation. Old healed distal radius fracture with chronic dorsal angulation. Chronic avulsion fracture of Thomas ulnar styloid process again noted. Joint spaces are preserved. Bone mineralization is normal. Soft tissue swelling of Thomas distal forearm. IMPRESSION: 1. Interval revision ORIF of Thomas distal ulnar fracture and pinning of Thomas distal radioulnar joint. No acute osseous abnormality. 2. Old distal radius and ulnar styloid process fractures. Electronically Signed   By: Elsie ONEIDA Shoulder M.D.   On: 03/10/2023 12:34   DG Forearm Right Result Date:  03/10/2023 CLINICAL DATA:  RIGHT wrist pain EXAM: RIGHT FOREARM - 2 VIEW COMPARISON:  02/05/2023 FINDINGS: Interval hardware revision with replacement of dynamic fixation plate along Thomas distal ulna. Improved alignment. K-wire extends through Thomas distal ulna into Thomas radial metaphysis. IMPRESSION: No acute findings.  Interval hardware revision as above. Electronically Signed   By: Jackquline Boxer M.D.   On: 03/10/2023 12:31   ECHOCARDIOGRAM COMPLETE Result  Date: 03/09/2023    ECHOCARDIOGRAM REPORT   Thomas [name] Name:   [name] [facility] Date of Exam: 03/09/2023 Medical Rec #:  [id number]         Height:       71.0 in Accession #:    [id number]        Weight:       173.5 lb Date of Birth:  04/21/1960         BSA:          1.985 m Thomas [name] Age:    62 years          BP:           132/68 mmHg Thomas [name] Gender: M                 HR:           93 bpm. Exam Location:  [facility] Procedure: 2D Echo, Cardiac Doppler and Color Doppler Indications:    CHF-Acute Diastolic I50.31  History:        Thomas [name] has no prior history of Echocardiogram examinations.                 Risk Factors:Hypertension. Alcohol abuse.  Sonographer:    Aida Pizza RCS Referring Phys: [phone] [place] TAT IMPRESSIONS  1. Thomas aortic valve is abnormal. There is a linear shaped echodensity, measuring 2.0 x 0.35 cm, attached to Thomas non-coronary cusp of aortic valve leaflet with motion independent of Thomas aortic valve concerning for infective endocarditis. Best appreciated  in PLAX, SAX and A5C views. Aortic valve regurgitation is severe, centrally directed. Aortic regurgitation PHT measures 130 msec. Suboptimal SSN images, possibly has diastolic flow reversal in Thomas descending aorta. Mild aortic valve stenosis. Aortic valve mean gradient measures 13.0 mmHg. Aortic valve Vmax measures 2.50 m/s. Strongly consider TEE for further evaluation.  2. Left ventricular ejection fraction, by estimation, is >75%. Left ventricular ejection fraction by PLAX is 84 %. Thomas left ventricle has hyperdynamic function. Thomas left ventricle has no regional wall motion abnormalities. There is mild asymmetric left ventricular hypertrophy of Thomas basal and septal segments. Left ventricular diastolic parameters are indeterminate. Elevated left ventricular end-diastolic pressure.  3. Right ventricular systolic function is normal. Thomas right ventricular size is normal. Tricuspid regurgitation signal is inadequate for assessing PA pressure.  4. Left  atrial size was severely dilated.  5. Thomas mitral valve is normal in structure. Trivial mitral valve regurgitation. No evidence of mitral stenosis.  6. Thomas inferior vena cava is dilated in size with >50% respiratory variability, suggesting right atrial pressure of 8 mmHg.  7. Increased flow velocities may be secondary to anemia, thyrotoxicosis, hyperdynamic or high flow state. Comparison(s): No prior Echocardiogram. FINDINGS  Left Ventricle: Left ventricular ejection fraction, by estimation, is >75%. Left ventricular ejection fraction by PLAX is 84 %. Thomas left ventricle has hyperdynamic function. Thomas left ventricle has no regional wall motion abnormalities. Thomas left ventricular internal cavity size was normal in size. There is mild asymmetric left ventricular hypertrophy of Thomas basal and septal segments. Left ventricular diastolic parameters  are indeterminate. Elevated left ventricular end-diastolic pressure. Right Ventricle: Thomas right ventricular size is normal. No increase in right ventricular wall thickness. Right ventricular systolic function is normal. Tricuspid regurgitation signal is inadequate for assessing PA pressure. Left Atrium: Left atrial size was severely dilated. Right Atrium: Right atrial size was normal in size. Pericardium: There is no evidence of pericardial effusion. Mitral Valve: Thomas mitral valve is normal in structure. Trivial mitral valve regurgitation. No evidence of mitral valve stenosis. Tricuspid Valve: Thomas tricuspid valve is normal in structure. Tricuspid valve regurgitation is not demonstrated. No evidence of tricuspid stenosis. Aortic Valve: Thomas aortic valve is abnormal. Aortic valve regurgitation is severe. Aortic regurgitation PHT measures 130 msec. Mild aortic stenosis is present. Aortic valve mean gradient measures 13.0 mmHg. Aortic valve peak gradient measures 25.0 mmHg. Aortic valve area, by VTI measures 2.07 cm. Pulmonic Valve: Thomas pulmonic valve was grossly normal. Pulmonic  valve regurgitation is not visualized. No evidence of pulmonic stenosis. Aorta: Thomas aortic root is normal in size and structure. Venous: Thomas inferior vena cava is dilated in size with greater than 50% respiratory variability, suggesting right atrial pressure of 8 mmHg. IAS/Shunts: No atrial level shunt detected by color flow Doppler.  LEFT VENTRICLE PLAX 2D LV EF:         Left            Diastology                ventricular     LV e' medial:    8.16 cm/s                ejection        LV E/e' medial:  21.8                fraction by     LV e' lateral:   6.85 cm/s                PLAX is 84      LV E/e' lateral: 26.0                %. LVIDd:         5.40 cm LVIDs:         2.50 cm LV PW:         1.10 cm LV IVS:        1.00 cm LVOT diam:     2.00 cm LV SV:         104 LV SV Index:   52 LVOT Area:     3.14 cm  RIGHT VENTRICLE RV S prime:     17.30 cm/s TAPSE (M-mode): 3.6 cm LEFT ATRIUM              Index        RIGHT ATRIUM           Index LA diam:        4.40 cm  2.22 cm/m   RA Area:     15.50 cm LA Vol (A2C):   81.6 ml  41.11 ml/m  RA Volume:   38.40 ml  19.35 ml/m LA Vol (A4C):   116.0 ml 58.44 ml/m LA Biplane Vol: 99.5 ml  50.13 ml/m  AORTIC VALVE AV Area (Vmax):    2.24 cm AV Area (Vmean):   2.25 cm AV Area (VTI):     2.07 cm AV Vmax:           250.00 cm/s AV Vmean:  166.000 cm/s AV VTI:            0.500 m AV Peak Grad:      25.0 mmHg AV Mean Grad:      13.0 mmHg LVOT Vmax:         178.00 cm/s LVOT Vmean:        119.000 cm/s LVOT VTI:          0.330 m LVOT/AV VTI ratio: 0.66 AI PHT:            130 msec  AORTA Ao Root diam: 3.60 cm MITRAL VALVE MV Area (PHT): 3.31 cm     SHUNTS MV Decel Time: 229 msec     Systemic VTI:  0.33 m MR Peak grad: 66.3 mmHg     Systemic Diam: 2.00 cm MR Mean grad: 43.0 mmHg MR Vmax:      407.00 cm/s MR Vmean:     304.0 cm/s MV E velocity: 178.00 cm/s MV A velocity: 106.00 cm/s MV E/A ratio:  1.68 Vishnu Priya Mallipeddi Electronically signed by Diannah Late Mallipeddi  Signature Date/Time: 03/09/2023/7:30:08 PM    Final      Assessment and Plan:   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  Culture was negative at Thomas time.  Thomas [name] complained of intermittent fever at home up to 102.  He has been having worsening shortness of breath since wrist surgery.  He presented to Thomas hospital with heart failure symptoms.  Initial TTE suggestive of normal EF but evidence of aortic endocarditis.  Subsequent TEE confirmed both severe AI and severe MR with endocarditis involving both valves.  -Seen by Dr. Kerrin will discuss with Thomas [name] mechanical versus bioprosthetic valve replacement surgery either this Friday or next Monday.  Per Thomas [name], he has decided to proceed with mechanical valve replacement.  -Cardiology service consulted for preoperative cardiac evaluation.  Thomas [name] denies any exertional chest pain.  CT of Thomas chest showed coronary calcification in Thomas LAD territory.  Will discuss with MD regarding definitive evaluation using cardiac catheterization.  -Risk and benefit of procedure explained to Thomas [name] who display clear understanding and agree to proceed. Discussed with Thomas [name] possible procedural risk include bleeding, vascular injury, renal injury, arrythmia, MI, stroke and loss of limb or life.  Acute diastolic heart failure: Euvolemic at this point.  Last dose of IV Lasix  on 1/12, Thomas [name] has been transition to oral Lasix .  Thomas [name] appears to be euvolemic on exam.  Normal EF on echo.  Hypertension: Home olmesartan stopped, currently on low-dose spironolactone .  Remote history of substance abuse: Remote use of cocaine, snorted, never injected.  Last used 2010.  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  Due to complication from kidney stone.  Thrombocytopenia: Platelet 138.   Risk Assessment/Risk Scores:        [place]  Heart  Association (NYHA) Functional Class NYHA Class III        For questions or updates, please contact [facility] Please consult [email or web address] for contact info under    Bonney Scot Ford, [place]  03/13/2023 10:15 AM  Thomas [name] seen, examined. Available data reviewed. Agree with findings, assessment, and plan as outlined by Scot Ford, PA. Pt independently interviewed and examined. Alert, oriented male in NAD. HEENT normal, lungs clear, heart RRR with 3/6 harsh holosystolic murmur at Thomas apex and LLSB, abdomen soft and NT, extremities with no edema. Right arm  in ACE wrap. Pt with progressive symptoms of heart failure and febrile illness found to have aortic and mitral valve endocarditis with plans for double valve replacement per Dr Kerrin. He appears well-compensated from a heart failure standpoint after IV diuretic Rx. TEE images reviewed and agree. Cultures have grown strep species.  Continue oral lasix  and spiro. Avoid aggressive GDMT with pending cardiac surgery. Pt needs preoperative coronary evaluation, noted to have coronary calcification on CT of Thomas chest. He should undergo catheter angiography for definitive evaluation. I have reviewed Thomas risks, indications, and alternatives to cardiac catheterization, possible angioplasty, and stenting with Thomas [name]. Risks include but are not limited to bleeding, infection, vascular injury, stroke, myocardial infection, arrhythmia, kidney injury, radiation-related injury in Thomas case of prolonged fluoroscopy use, emergency cardiac surgery, and death. Thomas [name] understands Thomas risks of serious complication is 1-2 in 1000 with diagnostic cardiac cath. He understands there is some increase in risk of embolic event in Thomas setting of aortic valve vegetation, but caution will be taken to avoid crossing Thomas aortic valve. He provides full informed consent for Thomas procedure. He has 2+ left radial pulse should be appropriate for vascular access.  Otherwise, as  outlined above.   Ozell Fell, M.D. 03/13/2023 10:51 AM

## 2023-03-13 NOTE — Plan of Care (Signed)
  Problem: Education: Goal: Knowledge of General Education information will improve Description: Including pain rating scale, medication(s)/side effects and non-pharmacologic comfort measures Outcome: Progressing   Problem: Health Behavior/Discharge Planning: Goal: Ability to manage health-related needs will improve Outcome: Progressing   Problem: Clinical Measurements: Goal: Ability to maintain clinical measurements within normal limits will improve Outcome: Progressing Goal: Will remain free from infection Outcome: Progressing Goal: Diagnostic test results will improve Outcome: Progressing Goal: Respiratory complications will improve Outcome: Progressing Goal: Cardiovascular complication will be avoided Outcome: Progressing   Problem: Activity: Goal: Risk for activity intolerance will decrease Outcome: Progressing   Problem: Nutrition: Goal: Adequate nutrition will be maintained Outcome: Progressing   Problem: Coping: Goal: Level of anxiety will decrease Outcome: Progressing   Problem: Elimination: Goal: Will not experience complications related to bowel motility Outcome: Progressing Goal: Will not experience complications related to urinary retention Outcome: Progressing   Problem: Pain Management: Goal: General experience of comfort will improve Outcome: Progressing   Problem: Safety: Goal: Ability to remain free from injury will improve Outcome: Progressing   Problem: Skin Integrity: Goal: Risk for impaired skin integrity will decrease Outcome: Progressing   Problem: Activity: Goal: Capacity to carry out activities will improve Outcome: Progressing   Problem: Cardiac: Goal: Ability to achieve and maintain adequate cardiopulmonary perfusion will improve Outcome: Progressing   Problem: Education: Goal: Understanding of CV disease, CV risk reduction, and recovery process will improve Outcome: Progressing Goal: Individualized Educational  Video(s) Outcome: Progressing   Problem: Activity: Goal: Ability to return to baseline activity level will improve Outcome: Progressing   Problem: Cardiovascular: Goal: Ability to achieve and maintain adequate cardiovascular perfusion will improve Outcome: Progressing Goal: Vascular access site(s) Level 0-1 will be maintained Outcome: Progressing   Problem: Health Behavior/Discharge Planning: Goal: Ability to safely manage health-related needs after discharge will improve Outcome: Progressing

## 2023-03-13 NOTE — Plan of Care (Signed)
  Problem: Clinical Measurements: Goal: Respiratory complications will improve Outcome: Progressing Goal: Cardiovascular complication will be avoided Outcome: Progressing   Problem: Safety: Goal: Ability to remain free from injury will improve Outcome: Progressing   Problem: Cardiac: Goal: Ability to achieve and maintain adequate cardiopulmonary perfusion will improve Outcome: Progressing

## 2023-03-13 NOTE — Consult Note (Signed)
 [address]       [place] [address]             [phone]        DARIUSZ BRASE [place] Medical Record #[id number] Date of Birth: 1961-02-19  Referring: No ref. provider found Primary Care: Marvine Rush, MD Primary Cardiologist:None  Chief Complaint:    Chief Complaint  Patient presents with   Wound Infection  Shortness of breath  History of Present Illness:     Mr. 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 He underwent right wrist ulnar shortening osteotomy by ortho on 8/27. He then underwent right wrist ulnar shortening osteotomy plate removal and bone biopsies with cultures of the distal ulna  on 12/13 due to hardware failure and osteolysis. Cultures came back with no growth at that time. 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 He presented to the ED on 1/09 with one week of worsening shortness of breath, fatigue and increasing lower extremity edema. In the ED he was afebrile and hemodynamically stable on RA. BNP was 211, Troponin I was 27 and platelet count was 146,000. CXR showed increased vascular markings and interstitial markings and chest CTA showed bilateral pleural effusions with diffuse interstitial and interlobular septal thickening consistent with congestive heart failure or pulmonary edema. TTE on 1/10 showed LVEF>75%, severe aortic valve regurgitation with a linear shaped echodensity measuring 2.0cmx0.35cm concerning for endocarditis and mild aortic stenosis. TEE on 1/13 noted an aortic valve vegetation measuring 0.6cmx0.8cm located in the commissure between the NCC/LCC, severe aortic regurgitation, likely leaflet perforation, moderate aortic stenosis, severe mitral regurgitation  posteriorly directed, a vegetation of the A3 segment on the mitral valve with a flail A1/A2 segment with likely leaflet perforation. LVEF was estimated at 60-65% and there was mild (grade II) layered plaque involve the descending aorta. Blood cultures were taken and he was started on empiric Cefepime  and Vancomycin . Blood cultures were positive for streptococcus, ID was consulted and recommended discontinuing Cefepime  and starting Ceftriaxone .       Zubrod Score: At the time of surgery this patient's most appropriate activity status/level should be described as: []     0    Normal activity, no symptoms [x]     1    Restricted in physical strenuous activity but ambulatory, able to do out light work []     2    Ambulatory and capable of self care, unable to do work activities, up and about                 more than 50%  Of the time                            []     3    Only limited self care, in bed greater than 50% of waking hours []     4    Completely disabled, no self care, confined to bed or chair []     5    Moribund  Past Medical History:  Diagnosis Date   Blood dyscrasia    GERD (gastroesophageal reflux disease)    History of kidney stones    Hypertension    Thrombocytopenia (HCC) 04/12/2016  Past Surgical History:  Procedure Laterality Date   BONE BIOPSY Right 02/09/2023   Procedure: BONE BIOPSY;  Surgeon: Arlinda Buster, MD;  Location: [facility];  Service: Orthopedics;  Laterality: Right;   HARDWARE REMOVAL Right 02/09/2023   Procedure: RIGHT FOREARM HARDWARE REMOVAL;  Surgeon: Arlinda Buster, MD;  Location: [facility];  Service: Orthopedics;  Laterality: Right;   IRRIGATION AND DEBRIDEMENT ELBOW Right 02/09/2023   Procedure: RIGHT FOREARM IRRIGATION AND DEBRIDEMENT;  Surgeon: Arlinda Buster, MD;  Location: [facility];  Service: Orthopedics;  Laterality: Right;   ORIF ULNAR FRACTURE Right 02/27/2023   Procedure: RIGHT OPEN  REDUCTION INTERNAL FIXATION (ORIF) ULNAR FRACTURE;  Surgeon: Arlinda Buster, MD;  Location: [facility];  Service: Orthopedics;  Laterality: Right;   renalectomy Left    TRANSESOPHAGEAL ECHOCARDIOGRAM (CATH LAB) N/A 03/12/2023   Procedure: TRANSESOPHAGEAL ECHOCARDIOGRAM;  Surgeon: Barbaraann Darryle Ned, MD;  Location[facility] INVAS[facility] CV [facility];  Service: Cardiovascular;  Laterality: N/A;   WRIST OSTEOTOMY Right 10/24/2022   Procedure: RIGHT WRIST ULNAR SHORTENING OSTEOTOMY;  Surgeon: Arlinda Buster, MD;  Location:  SURGERY CENTER;  Service: Orthopedics;  Laterality: Right;  regional block    Social History   Tobacco Use  Smoking Status Never  Smokeless Tobacco Never    Social History   Substance and Sexual Activity  Alcohol Use Yes   Comment: occasionally     No Known Allergies  Current Facility-Administered Medications  Medication Dose Route Frequency Provider Last Rate Last Admin   acetaminophen  (TYLENOL ) tablet 650 mg  650 mg Oral Q4H PRN Barbaraann Darryle Ned, MD   650 mg at 03/10/23 1819   cefTRIAXone  (ROCEPHIN ) 2 g in sodium chloride  0.9 % 100 mL IVPB  2 g Intravenous Q24H Barbaraann Darryle Ned, MD 200 mL/hr at 03/12/23 1458 2 g at 03/12/23 1458   diphenhydrAMINE  (BENADRYL ) capsule 25 mg  25 mg Oral Q8H PRN Barbaraann Darryle Ned, MD   25 mg at 03/12/23 2325   enoxaparin  (LOVENOX ) injection 40 mg  40 mg Subcutaneous Q24H Barbaraann Darryle Ned, MD   40 mg at 03/12/23 1121   feeding supplement (ENSURE ENLIVE / ENSURE PLUS) liquid 237 mL  237 mL Oral BID BM Barbaraann Darryle Ned, MD   237 mL at 03/13/23 0731   feeding supplement (ENSURE ENLIVE / ENSURE PLUS) liquid 237 mL  237 mL Oral BID BM Barbaraann Darryle Ned, MD   237 mL at 03/12/23 1458   fentaNYL  (SUBLIMAZE ) injection 25 mcg  25 mcg Intravenous Q2H PRN Barbaraann Darryle Ned, MD   25 mcg at 03/13/23 0636   folic acid  (FOLVITE ) tablet 1 mg  1 mg Oral Daily Barbaraann Darryle Ned, MD   1 mg at 03/13/23 9271    furosemide  (LASIX ) tablet 40 mg  40 mg Oral Daily Barbaraann Darryle Ned, MD   40 mg at 03/13/23 9271   HYDROcodone -acetaminophen  (NORCO) 10-325 MG per tablet 1 tablet  1 tablet Oral Q4H PRN Arrien, Elidia Sieving, MD       LORazepam  (ATIVAN ) tablet 1 mg  1 mg Oral Q4H PRN Barbaraann Darryle Ned, MD       methocarbamol  (ROBAXIN ) tablet 500 mg  500 mg Oral Q6H PRN Barbaraann Darryle Ned, MD   500 mg at 03/11/23 2151   multivitamin with minerals tablet 1 tablet  1 tablet Oral Daily Barbaraann Darryle Ned, MD   1 tablet at 03/13/23 0728   ondansetron  (ZOFRAN ) injection 4 mg  4 mg Intravenous Q6H  PRN Barbaraann Darryle Ned, MD       spironolactone  (ALDACTONE ) tablet 12.5 mg  12.5 mg Oral Daily O'Neal, Darryle Ned, MD   12.5 mg at 03/13/23 9272   thiamine  (VITAMIN B1) tablet 100 mg  100 mg Oral Daily Barbaraann Darryle Ned, MD   100 mg at 03/13/23 9272   Or   thiamine  (VITAMIN B1) injection 100 mg  100 mg Intravenous Daily Barbaraann Darryle Ned, MD       vancomycin  LUCRECIA) IVPB 1250 mg/250 mL  1,250 mg Intravenous Q12H Barbaraann Darryle Ned, MD 166.7 mL/hr at 03/12/23 2335 1,250 mg at 03/12/23 2335    Medications Prior to Admission  Medication Sig Dispense Refill Last Dose/Taking   acetaminophen  (TYLENOL ) 650 MG CR tablet Take 1,300 mg by mouth every 8 (eight) hours as needed for pain.   Past Week   ALPRAZolam  (XANAX ) 1 MG tablet Take 1 mg by mouth at bedtime as needed for anxiety.   Past Week   ibuprofen  (ADVIL ) 800 MG tablet Take 1 tablet (800 mg total) by mouth 3 (three) times daily. Take with food (Patient taking differently: Take 200 mg by mouth every 8 (eight) hours as needed for fever, headache or mild pain (pain score 1-3). Take with food) 60 tablet 0 Taking Differently   olmesartan (BENICAR) 40 MG tablet Take 40 mg by mouth daily.   Past Week   omeprazole (PRILOSEC OTC) 20 MG tablet Take 20 mg by mouth daily.   03/07/2023   oxyCODONE  (ROXICODONE ) 5 MG immediate release tablet Take 1 tablet (5  mg total) by mouth every 6 (six) hours as needed. (Patient not taking: Reported on 03/08/2023) 30 tablet 0 Not Taking   DULoxetine (CYMBALTA) 60 MG capsule Take 60 mg by mouth daily. (Patient not taking: Reported on 03/08/2023)   Not Taking    Family History  Problem Relation Age of Onset   Healthy Mother    Cancer Father    Review of Systems:   Review of Systems  Constitutional:  Positive for malaise/fatigue. Negative for fever.  Respiratory:  Positive for shortness of breath.   Cardiovascular:  Positive for leg swelling. Negative for chest pain.       Physical Exam: BP (!) 143/63 (BP Location: Left Arm)   Pulse 90   Temp 98.5 F (36.9 C) (Oral)   Resp 16   Ht 5' 11 (1.803 m)   Wt 77.2 kg   SpO2 96%   BMI 23.75 kg/m  Neuro: Alert and oriented x 3, no focal deficits HEENT: No obvious dental issue Cardiac: Regular rate and rhythm.  Systolic and diastolic murmurs at right upper sternal border, high-pitched murmur apex Lungs diminished breath sounds bibasilar Abdomen soft nontender Right forearm splint Trace edema both lower extremities  Diagnostic Studies & Radiology Findings:  CLINICAL DATA:  Cough.   EXAM: PORTABLE CHEST 1 VIEW   COMPARISON:  12/20/2021.   FINDINGS: There are increased interstitial markings with lower lobe predominance and several Kerley B-lines, favoring congestive heart failure/pulmonary edema. Bilateral lung fields are otherwise clear. Bilateral costophrenic angles are clear.   Normal cardio-mediastinal silhouette, considering AP technique.   No acute osseous abnormalities.   The soft tissues are within normal limits.   IMPRESSION: *Findings favor mild congestive heart failure/pulmonary edema.     Electronically Signed   By: Ree Molt M.D.   On: 03/08/2023 08:13  CLINICAL DATA:  Recent surgery. Cough. Concern for pulmonary embolism.   EXAM: CT ANGIOGRAPHY CHEST WITH CONTRAST  TECHNIQUE: Multidetector CT imaging of the  chest was performed using the standard protocol during bolus administration of intravenous contrast. Multiplanar CT image reconstructions and MIPs were obtained to evaluate the vascular anatomy.   RADIATION DOSE REDUCTION: This exam was performed according to the departmental dose-optimization program which includes automated exposure control, adjustment of the mA and/or kV according to patient size and/or use of iterative reconstruction technique.   CONTRAST:  75mL OMNIPAQUE  IOHEXOL  350 MG/ML SOLN   COMPARISON:  Chest radiograph dated 03/08/2023.   FINDINGS: Evaluation of this exam is limited due to respiratory motion.   Cardiovascular: There is no cardiomegaly or pericardial effusion. There is coronary vascular calcification. Mild atherosclerotic calcification of the thoracic aorta. No aneurysmal dilatation or dissection. The origins of the great vessels of the aortic arch appear patent. Evaluation of the pulmonary arteries is limited due to severe respiratory motion and suboptimal opacification and timing of the contrast. No large or central pulmonary artery embolus identified.   Mediastinum/Nodes: No hilar or mediastinal adenopathy. The esophagus is grossly unremarkable. No mediastinal fluid collection.   Lungs/Pleura: Moderate bilateral pleural effusions with partial compressive atelectasis of the lower lobes. There is diffuse interstitial and interlobular septal prominence suggestive of edema. Areas of airspace ground-glass density in the right upper lobe may represent edema, although pneumonia is not excluded. No pneumothorax. The central airways are patent.   Upper Abdomen: Partially visualized left moderate hydronephrosis versus parapelvic cyst. CT abdomen pelvis or renal ultrasound may provide better evaluation.   Musculoskeletal: No acute osseous pathology.   Review of the MIP images confirms the above findings.   IMPRESSION: 1. No CT evidence of large or  central pulmonary artery embolus. 2. Findings of CHF with moderate bilateral pleural effusions. Superimposed pneumonia is not excluded. 3. Partially visualized left moderate hydronephrosis versus parapelvic cyst. CT abdomen pelvis or renal ultrasound may provide better evaluation. 4.  Aortic Atherosclerosis (ICD10-I70.0).     Electronically Signed   By: Vanetta Chou M.D.   On: 03/08/2023 12:03  ECHOCARDIOGRAM REPORT       Patient Name:   [name] [facility] Date of Exam: 03/09/2023  Medical Rec #:  [id number]         Height:       71.0 in  Accession #:    [id number]        Weight:       173.5 lb  Date of Birth:  1961/02/09         BSA:          1.985 m  Patient Age:    62 years          BP:           132/68 mmHg  Patient Gender: M                 HR:           93 bpm.  Exam Location:  [facility]   Procedure: 2D Echo, Cardiac Doppler and Color Doppler   Indications:    CHF-Acute Diastolic I50.31    History:        Patient has no prior history of Echocardiogram  examinations.                 Risk Factors:Hypertension. Alcohol abuse.    Sonographer:    Aida Pizza RCS  Referring Phys: [phone] A[place] TAT   IMPRESSIONS     1. The aortic valve is abnormal. There is a linear shaped  echodensity,  measuring 2.0 x 0.35 cm, attached to the non-coronary cusp of aortic valve  leaflet with motion independent of the aortic valve concerning for  infective endocarditis. Best appreciated   in PLAX, SAX and A5C views. Aortic valve regurgitation is severe,  centrally directed. Aortic regurgitation PHT measures 130 msec. Suboptimal  SSN images, possibly has diastolic flow reversal in the descending aorta.  Mild aortic valve stenosis. Aortic  valve mean gradient measures 13.0 mmHg. Aortic valve Vmax measures 2.50  m/s. Strongly consider TEE for further evaluation.   2. Left ventricular ejection fraction, by estimation, is >75%. Left  ventricular ejection fraction by PLAX is 84 %. The left  ventricle has  hyperdynamic function. The left ventricle has no regional wall motion  abnormalities. There is mild asymmetric left  ventricular hypertrophy of the basal and septal segments. Left ventricular  diastolic parameters are indeterminate. Elevated left ventricular  end-diastolic pressure.   3. Right ventricular systolic function is normal. The right ventricular  size is normal. Tricuspid regurgitation signal is inadequate for assessing  PA pressure.   4. Left atrial size was severely dilated.   5. The mitral valve is normal in structure. Trivial mitral valve  regurgitation. No evidence of mitral stenosis.   6. The inferior vena cava is dilated in size with >50% respiratory  variability, suggesting right atrial pressure of 8 mmHg.   7. Increased flow velocities may be secondary to anemia, thyrotoxicosis,  hyperdynamic or high flow state.   Comparison(s): No prior Echocardiogram.   FINDINGS   Left Ventricle: Left ventricular ejection fraction, by estimation, is  >75%. Left ventricular ejection fraction by PLAX is 84 %. The left  ventricle has hyperdynamic function. The left ventricle has no regional  wall motion abnormalities. The left  ventricular internal cavity size was normal in size. There is mild  asymmetric left ventricular hypertrophy of the basal and septal segments.  Left ventricular diastolic parameters are indeterminate. Elevated left  ventricular end-diastolic pressure.   Right Ventricle: The right ventricular size is normal. No increase in  right ventricular wall thickness. Right ventricular systolic function is  normal. Tricuspid regurgitation signal is inadequate for assessing PA  pressure.   Left Atrium: Left atrial size was severely dilated.   Right Atrium: Right atrial size was normal in size.   Pericardium: There is no evidence of pericardial effusion.   Mitral Valve: The mitral valve is normal in structure. Trivial mitral  valve regurgitation. No  evidence of mitral valve stenosis.   Tricuspid Valve: The tricuspid valve is normal in structure. Tricuspid  valve regurgitation is not demonstrated. No evidence of tricuspid  stenosis.   Aortic Valve: The aortic valve is abnormal. Aortic valve regurgitation is  severe. Aortic regurgitation PHT measures 130 msec. Mild aortic stenosis  is present. Aortic valve mean gradient measures 13.0 mmHg. Aortic valve  peak gradient measures 25.0 mmHg.  Aortic valve area, by VTI measures 2.07 cm.   Pulmonic Valve: The pulmonic valve was grossly normal. Pulmonic valve  regurgitation is not visualized. No evidence of pulmonic stenosis.   Aorta: The aortic root is normal in size and structure.   Venous: The inferior vena cava is dilated in size with greater than 50%  respiratory variability, suggesting right atrial pressure of 8 mmHg.   IAS/Shunts: No atrial level shunt detected by color flow Doppler.     LEFT VENTRICLE  PLAX 2D  LV EF:         Left  Diastology                 ventricular     LV e' medial:    8.16 cm/s                 ejection        LV E/e' medial:  21.8                 fraction by     LV e' lateral:   6.85 cm/s                 PLAX is 84      LV E/e' lateral: 26.0                 %.  LVIDd:         5.40 cm  LVIDs:         2.50 cm  LV PW:         1.10 cm  LV IVS:        1.00 cm  LVOT diam:     2.00 cm  LV SV:         104  LV SV Index:   52  LVOT Area:     3.14 cm     RIGHT VENTRICLE  RV S prime:     17.30 cm/s  TAPSE (M-mode): 3.6 cm   LEFT ATRIUM              Index        RIGHT ATRIUM           Index  LA diam:        4.40 cm  2.22 cm/m   RA Area:     15.50 cm  LA Vol (A2C):   81.6 ml  41.11 ml/m  RA Volume:   38.40 ml  19.35 ml/m  LA Vol (A4C):   116.0 ml 58.44 ml/m  LA Biplane Vol: 99.5 ml  50.13 ml/m   AORTIC VALVE  AV Area (Vmax):    2.24 cm  AV Area (Vmean):   2.25 cm  AV Area (VTI):     2.07 cm  AV Vmax:           250.00 cm/s  AV  Vmean:          166.000 cm/s  AV VTI:            0.500 m  AV Peak Grad:      25.0 mmHg  AV Mean Grad:      13.0 mmHg  LVOT Vmax:         178.00 cm/s  LVOT Vmean:        119.000 cm/s  LVOT VTI:          0.330 m  LVOT/AV VTI ratio: 0.66  AI PHT:            130 msec    AORTA  Ao Root diam: 3.60 cm   MITRAL VALVE  MV Area (PHT): 3.31 cm     SHUNTS  MV Decel Time: 229 msec     Systemic VTI:  0.33 m  MR Peak grad: 66.3 mmHg     Systemic Diam: 2.00 cm  MR Mean grad: 43.0 mmHg  MR Vmax:      407.00 cm/s  MR Vmean:     304.0 cm/s  MV E velocity: 178.00 cm/s  MV A velocity: 106.00 cm/s  MV E/A ratio:  1.68  Vishnu Priya Mallipeddi  Electronically signed by Diannah Late Mallipeddi  Signature Date/Time: 03/09/2023/7:30:08 PM      Final       Con JAYSON Helm, PA-C  Assessment & Plan: 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  He presented with shortness of breath and was found to have acute on chronic diastolic heart failure.  Workup included transthoracic echocardiogram which showed aortic and mitral valve vegetations and regurgitation.  Blood cultures are positive for a Streptococcus species.  He had a transesophageal echocardiogram yesterday which showed ejection fraction of 60%.  There was a 6 x 8 mm vegetation on the aortic valve with severe aortic regurgitation and a 4 x 7 mm mitral valve vegetation with a flail A1/A2 segment with likely leaflet perforation and severe mitral regurgitation.  He is afebrile although he never really had fever as part of his presentation.  Complicating factors include history of left nephrectomy and heavy ethanol use.  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  On vancomycin  and ceftriaxone .  Afebrile with normal white count, although that was the case prior to admission.  Small vegetations noted on TEE but significant valvular destruction and severe AI and MR.  Will  need AVR and MVR.  Given his age the preferred option would be mechanical AVR and MVR.  That would require lifelong anticoagulation with Coumadin .  He does have a significant ethanol history, which could be a complicating factor.  Tissue valves are an option but would likely need redo surgery within a relatively short timeframe.  He will give those options consideration.  CT angio chest did show some coronary calcification.  He needs his coronary arteries evaluated.  He needs a formal cardiology consultation.  Would defer to them whether to do catheterization or coronary CT with FFR.  Will order orthopantogram.  Has not seen a dentist in a while.  Will see if we can obtain a dental/oral surgery consult while he is in the hospital.  He was quite taken aback after being informed that he needed heart surgery.  He will need a little time to process that information.  Tentative plan would be surgery either Friday or Monday.  Elspeth MOTE Kerrin, MD [facility] [phone]

## 2023-03-13 NOTE — Progress Notes (Signed)
 [facility]   Reason for visit: Follow up on AV endocarditis, MV endocarditis  Interval History: culture with Streptococcus on BC ID and micro still pending.  No fever or chills.    Physical Exam: Constitutional:  Vitals:   03/12/23 2012 03/13/23 0321  BP: 127/62 (!) 143/63  Pulse: 95 90  Resp: 18 16  Temp: 98.5 F (36.9 C) 98.5 F (36.9 C)  SpO2: 97% 96%   patient appears in NAD Respiratory: Normal respiratory effort  Review of Systems: Constitutional: negative for fevers and chills  Lab Results  Component Value Date   WBC 6.5 03/13/2023   HGB 9.4 (L) 03/13/2023   HCT 28.8 (L) 03/13/2023   MCV 86.7 03/13/2023   PLT 138 (L) 03/13/2023    Lab Results  Component Value Date   CREATININE 0.70 03/13/2023   BUN 11 03/13/2023   NA 132 (L) 03/13/2023   K 3.9 03/13/2023   CL 105 03/13/2023   CO2 20 (L) 03/13/2023    Lab Results  Component Value Date   ALT 34 03/08/2023   AST 79 (H) 03/08/2023   ALKPHOS 96 03/08/2023     Microbiology: Recent Results (from the past 240 hours)  Resp panel by RT-PCR (RSV, Flu A&B, Covid) Anterior Nasal Swab     Status: None   Collection Time: 03/08/23  8:20 AM   Specimen: Anterior Nasal Swab  Result Value Ref Range Status   SARS Coronavirus 2 by RT PCR NEGATIVE NEGATIVE Final    Comment: (NOTE) SARS-CoV-2 target nucleic acids are NOT DETECTED.  The SARS-CoV-2 RNA is generally detectable in upper respiratory specimens during the acute phase of infection. The lowest concentration of SARS-CoV-2 viral copies this assay can detect is 138 copies/mL. A negative result does not preclude SARS-Cov-2 infection and should not be used as the sole basis for treatment or other patient management decisions. A negative result may occur with  improper specimen collection/handling, submission of specimen other than nasopharyngeal swab, presence of viral mutation(s) within the areas targeted by this assay, and  inadequate number of viral copies(<138 copies/mL). A negative result must be combined with clinical observations, patient history, and epidemiological information. The expected result is Negative.  Fact Sheet for Patients:  bloggercourse.com  Fact Sheet for Healthcare Providers:  seriousbroker.it  This test is no t yet approved or cleared by the [place]  FDA and  has been authorized for detection and/or diagnosis of SARS-CoV-2 by FDA under an Emergency Use Authorization (EUA). This EUA will remain  in effect (meaning this test can be used) for the duration of the COVID-19 declaration under Section 564(b)(1) of the Act, 21 U.S.C.section 360bbb-3(b)(1), unless the authorization is terminated  or revoked sooner.       Influenza A by PCR NEGATIVE NEGATIVE Final   Influenza B by PCR NEGATIVE NEGATIVE Final    Comment: (NOTE) The Xpert Xpress SARS-CoV-2/FLU/RSV plus assay is intended as an aid in the diagnosis of influenza from Nasopharyngeal swab specimens and should not be used as a sole basis for treatment. Nasal washings and aspirates are unacceptable for Xpert Xpress SARS-CoV-2/FLU/RSV testing.  Fact Sheet for Patients: bloggercourse.com  Fact Sheet for Healthcare Providers: seriousbroker.it  This test is not yet approved or cleared by the [place]  FDA and has been authorized for detection and/or diagnosis of SARS-CoV-2 by FDA under an Emergency Use Authorization (EUA). This EUA will remain in effect (meaning this test can be used) for the duration of the  COVID-19 declaration under Section 564(b)(1) of the Act, 21 U.S.C. section 360bbb-3(b)(1), unless the authorization is terminated or revoked.     Resp Syncytial Virus by PCR NEGATIVE NEGATIVE Final    Comment: (NOTE) Fact Sheet for Patients: bloggercourse.com  Fact Sheet for Healthcare  Providers: seriousbroker.it  This test is not yet approved or cleared by the [place]  FDA and has been authorized for detection and/or diagnosis of SARS-CoV-2 by FDA under an Emergency Use Authorization (EUA). This EUA will remain in effect (meaning this test can be used) for the duration of the COVID-19 declaration under Section 564(b)(1) of the Act, 21 U.S.C. section 360bbb-3(b)(1), unless the authorization is terminated or revoked.  Performed at [facility], [address], [place], [place] [address]   Culture, blood (Routine X 2) w Reflex to ID Panel     Status: None (Preliminary result)   Collection Time: 03/10/23 10:57 AM   Specimen: BLOOD  Result Value Ref Range Status   Specimen Description   Final    BLOOD BLOOD LEFT FOREARM Performed at [facility], [address] [place] [address], [place], [place] [address]    Special Requests   Final    BOTTLES DRAWN AEROBIC AND ANAEROBIC Blood Culture adequate volume Performed at [facility], [address] [place], [place] [address]    Culture  Setup Time   Final    GRAM POSITIVE COCCI ANAEROBIC BOTTLE ONLY Gram Stain Report Called to,Read Back By and Verified With: N.DORMON ON  03/11/2023 @10 :19 BY T.HAMER CRITICAL RESULT CALLED TO, READ BACK BY AND VERIFIED WITH: PHARMD J LEFDFORD 03/12/2023 @ 0142 BY AB    Culture   Final    GRAM POSITIVE COCCI CULTURE REINCUBATED FOR BETTER GROWTH Performed at [facility], [address], [place], [place] [address]    Report Status PENDING  Incomplete  Culture, blood (Routine X 2) w Reflex to ID Panel     Status: None (Preliminary result)   Collection Time: 03/10/23 10:57 AM   Specimen: BLOOD  Result Value Ref Range Status   Specimen Description BLOOD BLOOD LEFT HAND  Final   Special Requests   Final    BOTTLES DRAWN AEROBIC AND ANAEROBIC Blood Culture adequate volume   Culture   Final    NO GROWTH 3 DAYS Performed at [facility], [address], [place], [place]  [address]    Report Status PENDING  Incomplete  Blood Culture ID Panel (Reflexed)     Status: Abnormal   Collection Time: 03/10/23 10:57 AM  Result Value Ref Range Status   Enterococcus faecalis NOT DETECTED NOT DETECTED Final   Enterococcus Faecium NOT DETECTED NOT DETECTED Final   Listeria monocytogenes NOT DETECTED NOT DETECTED Final   Staphylococcus species NOT DETECTED NOT DETECTED Final   Staphylococcus aureus (BCID) NOT DETECTED NOT DETECTED Final   Staphylococcus epidermidis NOT DETECTED NOT DETECTED Final   Staphylococcus lugdunensis NOT DETECTED NOT DETECTED Final   Streptococcus species DETECTED (A) NOT DETECTED Final    Comment: Not Enterococcus species, Streptococcus agalactiae, Streptococcus pyogenes, or Streptococcus pneumoniae. CRITICAL RESULT CALLED TO, READ BACK BY AND VERIFIED WITH: PHARMD J LEFDFORD 03/12/2023 @ 0142 BY AB    Streptococcus agalactiae NOT DETECTED NOT DETECTED Final   Streptococcus pneumoniae NOT DETECTED NOT DETECTED Final   Streptococcus pyogenes NOT DETECTED NOT DETECTED Final   A.calcoaceticus-baumannii NOT DETECTED NOT DETECTED Final   Bacteroides fragilis NOT DETECTED NOT DETECTED Final   Enterobacterales NOT DETECTED NOT DETECTED Final   Enterobacter cloacae  complex NOT DETECTED NOT DETECTED Final   Escherichia coli NOT DETECTED NOT DETECTED Final   Klebsiella aerogenes NOT DETECTED NOT DETECTED Final   Klebsiella oxytoca NOT DETECTED NOT DETECTED Final   Klebsiella pneumoniae NOT DETECTED NOT DETECTED Final   Proteus species NOT DETECTED NOT DETECTED Final   Salmonella species NOT DETECTED NOT DETECTED Final   Serratia marcescens NOT DETECTED NOT DETECTED Final   Haemophilus influenzae NOT DETECTED NOT DETECTED Final   Neisseria meningitidis NOT DETECTED NOT DETECTED Final   Pseudomonas aeruginosa NOT DETECTED NOT DETECTED Final   Stenotrophomonas maltophilia NOT DETECTED NOT DETECTED Final   Candida albicans NOT DETECTED NOT DETECTED Final    Candida auris NOT DETECTED NOT DETECTED Final   Candida glabrata NOT DETECTED NOT DETECTED Final   Candida krusei NOT DETECTED NOT DETECTED Final   Candida parapsilosis NOT DETECTED NOT DETECTED Final   Candida tropicalis NOT DETECTED NOT DETECTED Final   Cryptococcus neoformans/gattii NOT DETECTED NOT DETECTED Final    Comment: Performed at [facility], [address] [place], [place] [address]  Culture, blood (Routine X 2) w Reflex to ID Panel     Status: None (Preliminary result)   Collection Time: 03/11/23  8:53 PM   Specimen: BLOOD  Result Value Ref Range Status   Specimen Description BLOOD BLOOD LEFT HAND  Final   Special Requests   Final    BOTTLES DRAWN AEROBIC AND ANAEROBIC Blood Culture results may not be optimal due to an inadequate volume of blood received in culture bottles   Culture   Final    NO GROWTH 2 DAYS Performed at [facility], [address] [place], [place] [address]    Report Status PENDING  Incomplete  Culture, blood (Routine X 2) w Reflex to ID Panel     Status: None (Preliminary result)   Collection Time: 03/11/23  8:53 PM   Specimen: BLOOD  Result Value Ref Range Status   Specimen Description BLOOD BLOOD LEFT ARM  Final   Special Requests   Final    BOTTLES DRAWN AEROBIC AND ANAEROBIC Blood Culture results may not be optimal due to an inadequate volume of blood received in culture bottles   Culture   Final    NO GROWTH 2 DAYS Performed at [facility], [address] [place], [place] [address]    Report Status PENDING  Incomplete    Impression/Plan:  1.  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  1 blood culture growing Streptococcus organism and still waiting for identification of the organism.  Cardiothoracic surgery note reviewed and plan for surgery Friday or Monday.  Will have him continue with current antibiotics including vancomycin  and ceftriaxone  pending ID of the organism.  He will get post surgery  antibiotics for prolonged period as well.  This was discussed with him.  I have personally spent 41 minutes involved in face-to-face and non-face-to-face activities for this patient on the day of the visit. Professional time spent includes the following activities: Preparing to see the patient (review of tests), Obtaining and/or reviewing separately obtained history (admission/discharge record), Performing a medically appropriate examination and/or evaluation , Ordering medications/tests/procedures, referring and communicating with other health care professionals, Documenting clinical information in the EMR, Independently interpreting results (not separately reported), Communicating results to the patient/family/caregiver, Counseling and educating the patient/family/caregiver and Care coordination (not separately reported).

## 2023-03-14 ENCOUNTER — Inpatient Hospital Stay (HOSPITAL_COMMUNITY): Payer: 59

## 2023-03-14 ENCOUNTER — Encounter: Payer: 59 | Admitting: Orthopedic Surgery

## 2023-03-14 ENCOUNTER — Encounter (HOSPITAL_COMMUNITY): Payer: Self-pay | Admitting: Cardiology

## 2023-03-14 DIAGNOSIS — I059 Rheumatic mitral valve disease, unspecified: Secondary | ICD-10-CM | POA: Diagnosis not present

## 2023-03-14 DIAGNOSIS — I5033 Acute on chronic diastolic (congestive) heart failure: Secondary | ICD-10-CM | POA: Diagnosis not present

## 2023-03-14 DIAGNOSIS — I351 Nonrheumatic aortic (valve) insufficiency: Secondary | ICD-10-CM | POA: Diagnosis not present

## 2023-03-14 DIAGNOSIS — Z01810 Encounter for preprocedural cardiovascular examination: Secondary | ICD-10-CM

## 2023-03-14 DIAGNOSIS — I339 Acute and subacute endocarditis, unspecified: Secondary | ICD-10-CM | POA: Diagnosis not present

## 2023-03-14 DIAGNOSIS — R7881 Bacteremia: Secondary | ICD-10-CM | POA: Diagnosis not present

## 2023-03-14 DIAGNOSIS — I340 Nonrheumatic mitral (valve) insufficiency: Secondary | ICD-10-CM | POA: Diagnosis not present

## 2023-03-14 LAB — CBC
HCT: 27.5 % — ABNORMAL LOW (ref 39.0–52.0)
Hemoglobin: 8.9 g/dL — ABNORMAL LOW (ref 13.0–17.0)
MCH: 28.3 pg (ref 26.0–34.0)
MCHC: 32.4 g/dL (ref 30.0–36.0)
MCV: 87.3 fL (ref 80.0–100.0)
Platelets: 128 10*3/uL — ABNORMAL LOW (ref 150–400)
RBC: 3.15 MIL/uL — ABNORMAL LOW (ref 4.22–5.81)
RDW: 16 % — ABNORMAL HIGH (ref 11.5–15.5)
WBC: 5.4 10*3/uL (ref 4.0–10.5)
nRBC: 0 % (ref 0.0–0.2)

## 2023-03-14 LAB — CULTURE, BLOOD (ROUTINE X 2): Special Requests: ADEQUATE

## 2023-03-14 LAB — BASIC METABOLIC PANEL
Anion gap: 6 (ref 5–15)
BUN: 10 mg/dL (ref 8–23)
CO2: 21 mmol/L — ABNORMAL LOW (ref 22–32)
Calcium: 8.2 mg/dL — ABNORMAL LOW (ref 8.9–10.3)
Chloride: 106 mmol/L (ref 98–111)
Creatinine, Ser: 0.69 mg/dL (ref 0.61–1.24)
GFR, Estimated: >60 mL/min (ref >60)
Glucose, Bld: 155 mg/dL — ABNORMAL HIGH (ref 70–99)
Potassium: 3.3 mmol/L — ABNORMAL LOW (ref 3.5–5.1)
Sodium: 133 mmol/L — ABNORMAL LOW (ref 135–145)

## 2023-03-14 MED ORDER — POTASSIUM CHLORIDE CRYS ER 20 MEQ PO TBCR
40.0000 meq | EXTENDED_RELEASE_TABLET | Freq: Once | ORAL | Status: AC
  Administered 2023-03-14: 40 meq via ORAL
  Filled 2023-03-14: qty 2

## 2023-03-14 MED ORDER — ASPIRIN 81 MG PO TBEC
81.0000 mg | DELAYED_RELEASE_TABLET | Freq: Every day | ORAL | Status: DC
  Administered 2023-03-14 – 2023-03-15 (×2): 81 mg via ORAL
  Filled 2023-03-14 (×2): qty 1

## 2023-03-14 MED ORDER — SPIRONOLACTONE 25 MG PO TABS
25.0000 mg | ORAL_TABLET | Freq: Every day | ORAL | Status: DC
  Administered 2023-03-15: 25 mg via ORAL
  Filled 2023-03-14: qty 1

## 2023-03-14 MED ORDER — SODIUM CHLORIDE 0.9 % IV SOLN
12.0000 10*6.[IU] | Freq: Two times a day (BID) | INTRAVENOUS | Status: DC
  Administered 2023-03-14 – 2023-03-26 (×23): 12 10*6.[IU] via INTRAVENOUS
  Filled 2023-03-14 (×27): qty 12

## 2023-03-14 MED ORDER — METHOCARBAMOL 500 MG PO TABS
750.0000 mg | ORAL_TABLET | Freq: Four times a day (QID) | ORAL | Status: DC | PRN
  Administered 2023-03-14 – 2023-03-15 (×5): 750 mg via ORAL
  Filled 2023-03-14 (×5): qty 2

## 2023-03-14 MED ORDER — PENICILLIN G POTASSIUM 20000000 UNITS IJ SOLR
12.0000 10*6.[IU] | Freq: Two times a day (BID) | INTRAVENOUS | Status: DC
  Filled 2023-03-14 (×2): qty 12

## 2023-03-14 MED ORDER — OXYCODONE-ACETAMINOPHEN 5-325 MG PO TABS
1.0000 | ORAL_TABLET | Freq: Four times a day (QID) | ORAL | Status: DC | PRN
  Administered 2023-03-14 (×2): 1 via ORAL
  Filled 2023-03-14 (×2): qty 1

## 2023-03-14 MED ORDER — PANTOPRAZOLE SODIUM 40 MG PO TBEC
40.0000 mg | DELAYED_RELEASE_TABLET | Freq: Every day | ORAL | Status: DC
  Administered 2023-03-14 – 2023-03-15 (×2): 40 mg via ORAL
  Filled 2023-03-14 (×2): qty 1

## 2023-03-14 MED ORDER — ORAL CARE MOUTH RINSE: 15.0000 mL | OROMUCOSAL | Status: DC | PRN

## 2023-03-14 MED ORDER — ATORVASTATIN CALCIUM 40 MG PO TABS
40.0000 mg | ORAL_TABLET | Freq: Every day | ORAL | Status: DC
  Administered 2023-03-14 – 2023-03-26 (×12): 40 mg via ORAL
  Filled 2023-03-14 (×12): qty 1

## 2023-03-14 MED ORDER — LORAZEPAM 1 MG PO TABS
0.5000 mg | ORAL_TABLET | ORAL | Status: DC | PRN
  Administered 2023-03-15 – 2023-03-26 (×9): 0.5 mg via ORAL
  Filled 2023-03-14 (×10): qty 1

## 2023-03-14 MED ORDER — FENTANYL CITRATE PF 50 MCG/ML IJ SOSY
12.5000 ug | PREFILLED_SYRINGE | INTRAMUSCULAR | Status: DC | PRN
  Administered 2023-03-14 – 2023-03-15 (×6): 12.5 ug via INTRAVENOUS
  Filled 2023-03-14 (×6): qty 1

## 2023-03-14 NOTE — Progress Notes (Signed)
   Thomas [name] - 63 y.o. male MRN [id number]  Date of birth: May 22, 1960    Subjective:  63 year old male 2-week status post prior Right ulna shaft nonunion repair with spanning plate fixation and Right distal radial ulnar joint stabilization and pinning.  Pain is controlled in the right upper extremity.  Objective:   VITALS:   Vitals:   03/14/23 0248 03/14/23 0500 03/14/23 0527 03/14/23 0646  BP:      Pulse:      Resp:  18    Temp:  98.7 F (37.1 C)    TempSrc:  Oral    SpO2: 97%  98%   Weight:    76.9 kg  Height:        Gen: Awake and alert Pulm: Unlabored CV: Palpable pulses extremities  Right upper extremity: - Sugar-tong splint removed, sutures removed today from the forearm, no erythema or drainage, sterile dressings applied - Short arm cast was applied as well today - Digital range of motion is well-preserved, sensation intact distally, hand warm well-perfused - Pin site at the wrist remains clean dry and intact    Lab Results  Component Value Date   WBC 5.4 03/14/2023   HGB 8.9 (L) 03/14/2023   HCT 27.5 (L) 03/14/2023   MCV 87.3 03/14/2023   PLT 128 (L) 03/14/2023     Assessment/Plan:  63 year old male 2-week status post prior Right ulna shaft nonunion repair with spanning plate fixation and Right distal radial ulnar joint stabilization and pinning.    -Maintain nonweightbearing right upper extremity in short arm cast, sling when out of bed - Encourage digital range of motion, encourage elevation of the right upper extremity - Continue pain control - Hand surgery follow-up in additional 2 weeks, likely as an outpatient pending dispo from current admission     Merrill Abide, MD [place] 03/14/2023, 2:53 PM

## 2023-03-14 NOTE — Plan of Care (Signed)
  Problem: Clinical Measurements: Goal: Cardiovascular complication will be avoided Outcome: Progressing   Problem: Pain Management: Goal: General experience of comfort will improve Outcome: Progressing   Problem: Safety: Goal: Ability to remain free from injury will improve Outcome: Progressing   Problem: Cardiac: Goal: Ability to achieve and maintain adequate cardiopulmonary perfusion will improve Outcome: Progressing   Problem: Cardiovascular: Goal: Ability to achieve and maintain adequate cardiovascular perfusion will improve Outcome: Progressing Goal: Vascular access site(s) Level 0-1 will be maintained Outcome: Progressing

## 2023-03-14 NOTE — Plan of Care (Signed)
  Problem: Education: Goal: Knowledge of General Education information will improve Description: Including pain rating scale, medication(s)/side effects and non-pharmacologic comfort measures Outcome: Progressing   Problem: Health Behavior/Discharge Planning: Goal: Ability to manage health-related needs will improve Outcome: Progressing   Problem: Clinical Measurements: Goal: Ability to maintain clinical measurements within normal limits will improve Outcome: Progressing Goal: Will remain free from infection Outcome: Progressing Goal: Diagnostic test results will improve Outcome: Progressing Goal: Respiratory complications will improve Outcome: Progressing Goal: Cardiovascular complication will be avoided Outcome: Progressing   Problem: Clinical Measurements: Goal: Ability to maintain clinical measurements within normal limits will improve Outcome: Progressing Goal: Will remain free from infection Outcome: Progressing Goal: Diagnostic test results will improve Outcome: Progressing Goal: Respiratory complications will improve Outcome: Progressing Goal: Cardiovascular complication will be avoided Outcome: Progressing   Problem: Activity: Goal: Risk for activity intolerance will decrease Outcome: Progressing   Problem: Nutrition: Goal: Adequate nutrition will be maintained Outcome: Progressing   Problem: Coping: Goal: Level of anxiety will decrease Outcome: Progressing   Problem: Elimination: Goal: Will not experience complications related to bowel motility Outcome: Progressing Goal: Will not experience complications related to urinary retention Outcome: Progressing   Problem: Pain Management: Goal: General experience of comfort will improve Outcome: Progressing   Problem: Safety: Goal: Ability to remain free from injury will improve Outcome: Progressing   Problem: Skin Integrity: Goal: Risk for impaired skin integrity will decrease Outcome: Progressing    Problem: Education: Goal: Ability to demonstrate management of disease process will improve Outcome: Progressing Goal: Ability to verbalize understanding of medication therapies will improve Outcome: Progressing Goal: Individualized Educational Video(s) Outcome: Progressing   Problem: Activity: Goal: Capacity to carry out activities will improve Outcome: Progressing   Problem: Cardiac: Goal: Ability to achieve and maintain adequate cardiopulmonary perfusion will improve Outcome: Progressing   Problem: Education: Goal: Understanding of CV disease, CV risk reduction, and recovery process will improve Outcome: Progressing Goal: Individualized Educational Video(s) Outcome: Progressing   Problem: Activity: Goal: Ability to return to baseline activity level will improve Outcome: Progressing   Problem: Cardiovascular: Goal: Ability to achieve and maintain adequate cardiovascular perfusion will improve Outcome: Progressing Goal: Vascular access site(s) Level 0-1 will be maintained Outcome: Progressing   Problem: Health Behavior/Discharge Planning: Goal: Ability to safely manage health-related needs after discharge will improve Outcome: Progressing

## 2023-03-14 NOTE — Progress Notes (Signed)
 Pre-op (MVR/AVR) vascular studies have been completed.   Results can be found under chart review under CV PROC. 03/14/2023 12:34 PM Blaize Nipper RVT, RDMS

## 2023-03-14 NOTE — Progress Notes (Signed)
 1 Day Post-Op Procedure(s) (LRB): CORONARY ANGIOGRAPHY (N/A) Subjective: No complaints this AM  Objective: Vital signs in last 24 hours: Temp:  [98.1 F (36.7 C)-98.7 F (37.1 C)] 98.7 F (37.1 C) (01/15 0500) Pulse Rate:  [84-96] 93 (01/14 2345) Cardiac Rhythm: Normal sinus rhythm (01/15 0705) Resp:  [15-31] 18 (01/15 0500) BP: (129-176)/(46-61) 146/50 (01/15 0200) SpO2:  [93 %-100 %] 98 % (01/15 0527) Weight:  [76.9 kg] 76.9 kg (01/15 0646)  Hemodynamic parameters for last 24 hours:    Intake/Output from previous day: 01/14 0701 - 01/15 0700 In: 2064.3 [P.O.:829; IV Piggyback:1235.3] Out: 1320 [Urine:1320] Intake/Output this shift: No intake/output data recorded.  General appearance: alert, cooperative, and no distress Neurologic: intact Heart: regular rate and rhythm and murmurs Lungs: clear to auscultation bilaterally Extremities: edema trace  Lab Results: Recent Labs    03/13/23 1850 03/14/23 0815  WBC 6.2 5.4  HGB 9.2* 8.9*  HCT 28.1* 27.5*  PLT 140* 128*   BMET:  Recent Labs    03/13/23 0419 03/13/23 1850 03/14/23 0815  NA 132*  --  133*  K 3.9  --  3.3*  CL 105  --  106  CO2 20*  --  21*  GLUCOSE 100*  --  155*  BUN 11  --  10  CREATININE 0.70 0.70 0.69  CALCIUM  8.2*  --  8.2*    PT/INR: No results for input(s): "LABPROT", "INR" in the last 72 hours. ABG No results found for: "PHART", "HCO3", "TCO2", "ACIDBASEDEF", "O2SAT" CBG (last 3)  No results for input(s): "GLUCAP" in the last 72 hours.  Assessment/Plan: S/P Procedure(s) (LRB): CORONARY ANGIOGRAPHY (N/A) Aortic and mitral endocarditis On PCN G for strep We reviewed need for aortic and mitral valve replacement, small possibility of mitral repair.  I reviewed the general nature of the procedure including the need for general anesthesia, the incisions to be used, the use of cardiopulmonary bypass, ans the use of temporary pacemaker wires and drainage tubes postoperatively.  I informed  him of the indications, risks, benefits and alternatives.  He is aware the risks include but are not limited to death, MI, DVT, PE, bleeding, need for transfusion, infection, stroke, cardiac arrhythmias, complete heart block requiring permanent pacemaker placement, respiratory or renal failure, as well as the possibility of other unforeseeable complications.  He accepts the risks and agrees to proceed.  He wishes to have mechanical valves understanding the need for life long anticoagulation and limited ethanol intake.  Cath images reviewed.  Has non hemodynamically significant LAD disease- will not require CABG.  For OR Friday 1/17   LOS: 6 days    Zelphia Higashi 03/14/2023

## 2023-03-14 NOTE — Progress Notes (Addendum)
 Patient Name: Thomas [name] [facility] Date of Encounter: 03/14/2023 [facility] Cardiologist: None   Interval Summary  .    Laying in bed, no complaints. Plans for valve surgery on Friday.   Vital Signs .    Vitals:   03/14/23 0248 03/14/23 0500 03/14/23 0527 03/14/23 0646  BP:      Pulse:      Resp:  18    Temp:  98.7 F (37.1 C)    TempSrc:  Oral    SpO2: 97%  98%   Weight:    76.9 kg  Height:        Intake/Output Summary (Last 24 hours) at 03/14/2023 1012 Last data filed at 03/14/2023 0600 Gross per 24 hour  Intake 974 ml  Output 1320 ml  Net -346 ml      03/14/2023    6:46 AM 03/13/2023    5:00 AM 03/12/2023    3:13 AM  Last 3 Weights  Weight (lbs) 169 lb 8 oz 170 lb 4.8 oz 170 lb 8 oz  Weight (kg) 76.885 kg 77.248 kg 77.338 kg      Telemetry/ECG    Sinus Rhythm, intermittent tachycardia - Personally Reviewed  Physical Exam .   GEN: No acute distress.   Neck: No JVD Cardiac: RRR, no murmurs, rubs, or gallops.  Respiratory: Clear to auscultation bilaterally. GI: Soft, nontender, non-distended  MS: No edema, right forearm in cast   Assessment & Plan .     63 y.o. male with a hx of hypertension, history of left nephrectomy (around age 42 due to kidney stones?), remote substance abuse (cocaine, snort, never injection, last use around 2010), anxiety, GERD and thrombocytopenia who was seen 03/13/2023 for the cardiovascular evaluation prior to upcoming valve surgery at the request of Dr. Luna Salinas.   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  Blood cultures positive for Streptococcus species.  -- ID following on IV antibiotics  -- Seen by CT surgery with recommendations to undergo mechanical AVR/MVR planned for 1/17 -- Underwent cardiac catheterization 1/14 with moderate single-vessel CAD of proximal and mid LAD and 60%, recommendations for medical therapy as lesions were not flow  limiting. -- Continue aspirin , start atorvastatin  40 mg daily  HFpEF -- LVEF of greater than 75% -- s/p IV Lasix , transition to 40 mg daily p.o. Net -1.7 L. Appears euvolemic, will stop PO lasix    Hypertension -- BP elevated  -- increase spiro to 25mg  daily   Hypokalemia -- K+ 3.3, suppl  Per Primary Closed fracture of right ulna/late healing Thrombocytopenia EtOH abuse  For questions or updates, please contact [facility] Please consult [email or web address] for contact info under        Signed, Johnie Nailer, NP    ATTENDING ATTESTATION:  After conducting a review of all available clinical information with the care team, interviewing the patient, and performing a physical exam, I agree with the findings and plan described in this note.   GEN: No acute distress.   HEENT:  MMM, no JVD, no scleral icterus Cardiac: RRR, 3/6 holosystolic murmur apex Respiratory: Clear to auscultation bilaterally. GI: Soft, nontender, non-distended  MS: No edema; No deformity. Neuro:  Nonfocal  Vasc:  +2 radial pulses  Patient here with infective endocarditis affecting both the mitral and aortic valves.  The patient's blood cultures are positive for strep infantarius formerly known as strep bovis.  This strep species is associated with gastrointestinal malignancy.  The patient will need a colonoscopy following cardiac surgery.  I discussed this with the patient at length.    Otherwise continue current medical therapy.  The patient is euvolemic on exam today.  OR scheduled for Friday.  Alyssa Backbone, MD Pager [phone]

## 2023-03-14 NOTE — Progress Notes (Signed)
 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 Discussed plan to change antibiotics to Penicillin  G and need for protracted course of antibiotics following surgery which is currently planned for Friday. Therapeutic drug monitoring of CMET and CBC during treatment.  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 Remaining medical and supportive care per Internal Medicine.   PLAN:  Change antibiotics to Penicillin  G.  Await aortic and mitral valve replacement on Friday per CVTS.  Monitor blood cultures for clearance of bacteremia.  Therapeutic monitoring of CBC and CMET during treatment. Recommend colonoscopy following treatment Remaining medical and supportive care per Internal Medicine.   Principal Problem:   Acute on chronic diastolic CHF (congestive heart failure) (HCC) Active Problems:   Thrombocytopenia (HCC)   Closed fracture of shaft of right ulna with delayed healing   Alcohol abuse   Essential hypertension   Acute bacterial endocarditis    aspirin  EC  81 mg Oral Daily   atorvastatin   40 mg Oral Daily   enoxaparin  (LOVENOX ) injection  40 mg Subcutaneous Q24H   feeding supplement  237 mL Oral BID BM   feeding supplement  237 mL Oral BID BM   folic acid   1 mg Oral Daily   multivitamin with minerals  1 tablet Oral Daily   pantoprazole   40 mg Oral Daily   polyethylene glycol  17 g Oral Daily   sodium chloride  flush  3 mL Intravenous Q12H   [START ON 03/15/2023] spironolactone   25 mg Oral Daily   thiamine   100 mg Oral Daily   Or   thiamine   100 mg Intravenous Daily    SUBJECTIVE:  Afebrile overnight with no acute events.  Awaiting surgery on Friday. Has not had a colonoscopy.   Allergies  Allergen Reactions   Hydrocodone  Rash     Review of Systems: Review of Systems  Constitutional:  Negative for chills, fever and weight loss.  Respiratory:  Negative for cough, shortness of breath and wheezing.   Cardiovascular:  Negative for chest pain and leg swelling.  Gastrointestinal:  Positive for constipation. Negative for abdominal pain, diarrhea, nausea and vomiting.  Skin:  Negative for rash.      OBJECTIVE: Vitals:   03/14/23 0248 03/14/23 0500 03/14/23 0527 03/14/23 0646  BP:      Pulse:      Resp:  18    Temp:  98.7 F (37.1 C)    TempSrc:  Oral    SpO2: 97%  98%   Weight:    76.9 kg  Height:       Body mass index is 23.64 kg/m.  Physical Exam Constitutional:      General: He is not in acute distress.    Appearance: He is well-developed.  Cardiovascular:     Rate and Rhythm: Normal rate and regular rhythm.     Heart sounds: Normal heart sounds.  Pulmonary:     Effort: Pulmonary effort is normal.     Breath sounds: Normal breath sounds.  Skin:    General: Skin is warm and dry.  Neurological:     Mental Status: He is alert and oriented to person, place, and time.  Lab Results Lab Results  Component Value Date   WBC 5.4 03/14/2023   HGB 8.9 (L) 03/14/2023   HCT 27.5 (L) 03/14/2023   MCV 87.3 03/14/2023   PLT 128 (L) 03/14/2023    Lab Results  Component Value Date   CREATININE 0.69 03/14/2023   BUN 10 03/14/2023   NA 133 (L) 03/14/2023   K 3.3 (L) 03/14/2023   CL 106 03/14/2023   CO2 21 (L) 03/14/2023    Lab Results  Component Value Date   ALT 34 03/08/2023   AST 79 (H) 03/08/2023   ALKPHOS 96 03/08/2023   BILITOT 0.8 03/08/2023     Microbiology: Recent Results (from the past 240 hours)  Resp panel by RT-PCR (RSV, Flu A&B, Covid) Anterior Nasal Swab     Status: None   Collection Time: 03/08/23  8:20 AM   Specimen: Anterior Nasal Swab  Result Value Ref Range  Status   SARS Coronavirus 2 by RT PCR NEGATIVE NEGATIVE Final    Comment: (NOTE) SARS-CoV-2 target nucleic acids are NOT DETECTED.  The SARS-CoV-2 RNA is generally detectable in upper respiratory specimens during the acute phase of infection. The lowest concentration of SARS-CoV-2 viral copies this assay can detect is 138 copies/mL. A negative result does not preclude SARS-Cov-2 infection and should not be used as the sole basis for treatment or other patient management decisions. A negative result may occur with  improper specimen collection/handling, submission of specimen other than nasopharyngeal swab, presence of viral mutation(s) within the areas targeted by this assay, and inadequate number of viral copies(<138 copies/mL). A negative result must be combined with clinical observations, patient history, and epidemiological information. The expected result is Negative.  Fact Sheet for Patients:  BloggerCourse.com  Fact Sheet for Healthcare Providers:  SeriousBroker.it  This test is no t yet approved or cleared by the [place]  FDA and  has been authorized for detection and/or diagnosis of SARS-CoV-2 by FDA under an Emergency Use Authorization (EUA). This EUA will remain  in effect (meaning this test can be used) for the duration of the COVID-19 declaration under Section 564(b)(1) of the Act, 21 U.S.C.section 360bbb-3(b)(1), unless the authorization is terminated  or revoked sooner.       Influenza A by PCR NEGATIVE NEGATIVE Final   Influenza B by PCR NEGATIVE NEGATIVE Final    Comment: (NOTE) The Xpert Xpress SARS-CoV-2/FLU/RSV plus assay is intended as an aid in the diagnosis of influenza from Nasopharyngeal swab specimens and should not be used as a sole basis for treatment. Nasal washings and aspirates are unacceptable for Xpert Xpress SARS-CoV-2/FLU/RSV testing.  Fact Sheet for  Patients: BloggerCourse.com  Fact Sheet for Healthcare Providers: SeriousBroker.it  This test is not yet approved or cleared by the [place]  FDA and has been authorized for detection and/or diagnosis of SARS-CoV-2 by FDA under an Emergency Use Authorization (EUA). This EUA will remain in effect (meaning this test can be used) for the duration of the COVID-19 declaration under Section 564(b)(1) of the Act, 21 U.S.C. section 360bbb-3(b)(1), unless the authorization is terminated or revoked.     Resp Syncytial Virus by PCR NEGATIVE NEGATIVE Final    Comment: (NOTE) Fact Sheet for Patients: BloggerCourse.com  Fact Sheet for Healthcare Providers: SeriousBroker.it  This test is not yet approved or cleared by the [place]  FDA and has been authorized for detection and/or diagnosis of SARS-CoV-2 by FDA under an Emergency Use Authorization (EUA). This EUA will remain in effect (meaning this test can  be used) for the duration of the COVID-19 declaration under Section 564(b)(1) of the Act, 21 U.S.C. section 360bbb-3(b)(1), unless the authorization is terminated or revoked.  Performed at [facility], [address] [place], [place] [address]   Culture, blood (Routine X 2) w Reflex to ID Panel     Status: Abnormal   Collection Time: 03/10/23 10:57 AM   Specimen: BLOOD  Result Value Ref Range Status   Specimen Description   Final    BLOOD BLOOD LEFT FOREARM Performed at [facility], [address] [place], [place] [address]    Special Requests   Final    BOTTLES DRAWN AEROBIC AND ANAEROBIC Blood Culture adequate volume Performed at [facility], [address] [place], [place] [address]    Culture  Setup Time   Final    GRAM POSITIVE COCCI ANAEROBIC BOTTLE ONLY Gram Stain Report Called to,Read Back By and Verified With: N.DORMON ON  03/11/2023 @10 :19 BY T.HAMER CRITICAL  RESULT CALLED TO, READ BACK BY AND VERIFIED WITH: PHARMD J LEFDFORD 03/12/2023 @ 0142 BY AB Performed at [facility], [address] [place], [place] [address]    Culture STREPTOCOCCUS INFANTARIUS (A)  Final   Report Status 03/14/2023 FINAL  Final   Organism ID, Bacteria STREPTOCOCCUS INFANTARIUS  Final      Susceptibility   Streptococcus infantarius - MIC*    ERYTHROMYCIN <=0.12 SENSITIVE Sensitive     TETRACYCLINE <=0.25 SENSITIVE Sensitive     VANCOMYCIN  0.25 SENSITIVE Sensitive     CLINDAMYCIN <=0.25 SENSITIVE Sensitive     PENICILLIN  <=0.06      CEFTAZIDIME <=0.12      * STREPTOCOCCUS INFANTARIUS  Culture, blood (Routine X 2) w Reflex to ID Panel     Status: None (Preliminary result)   Collection Time: 03/10/23 10:57 AM   Specimen: BLOOD  Result Value Ref Range Status   Specimen Description BLOOD BLOOD LEFT HAND  Final   Special Requests   Final    BOTTLES DRAWN AEROBIC AND ANAEROBIC Blood Culture adequate volume   Culture   Final    NO GROWTH 4 DAYS Performed at [facility], [address], [place], [place] [address]    Report Status PENDING  Incomplete  Blood Culture ID Panel (Reflexed)     Status: Abnormal   Collection Time: 03/10/23 10:57 AM  Result Value Ref Range Status   Enterococcus faecalis NOT DETECTED NOT DETECTED Final   Enterococcus Faecium NOT DETECTED NOT DETECTED Final   Listeria monocytogenes NOT DETECTED NOT DETECTED Final   Staphylococcus species NOT DETECTED NOT DETECTED Final   Staphylococcus aureus (BCID) NOT DETECTED NOT DETECTED Final   Staphylococcus epidermidis NOT DETECTED NOT DETECTED Final   Staphylococcus lugdunensis NOT DETECTED NOT DETECTED Final   Streptococcus species DETECTED (A) NOT DETECTED Final    Comment: Not Enterococcus species, Streptococcus agalactiae, Streptococcus pyogenes, or Streptococcus pneumoniae. CRITICAL RESULT CALLED TO, READ BACK BY AND VERIFIED WITH: PHARMD J LEFDFORD 03/12/2023 @ 0142 BY AB    Streptococcus  agalactiae NOT DETECTED NOT DETECTED Final   Streptococcus pneumoniae NOT DETECTED NOT DETECTED Final   Streptococcus pyogenes NOT DETECTED NOT DETECTED Final   A.calcoaceticus-baumannii NOT DETECTED NOT DETECTED Final   Bacteroides fragilis NOT DETECTED NOT DETECTED Final   Enterobacterales NOT DETECTED NOT DETECTED Final   Enterobacter cloacae complex NOT DETECTED NOT DETECTED Final   Escherichia coli NOT DETECTED NOT DETECTED Final   Klebsiella aerogenes NOT DETECTED NOT DETECTED Final   Klebsiella oxytoca  NOT DETECTED NOT DETECTED Final   Klebsiella pneumoniae NOT DETECTED NOT DETECTED Final   Proteus species NOT DETECTED NOT DETECTED Final   Salmonella species NOT DETECTED NOT DETECTED Final   Serratia marcescens NOT DETECTED NOT DETECTED Final   Haemophilus influenzae NOT DETECTED NOT DETECTED Final   Neisseria meningitidis NOT DETECTED NOT DETECTED Final   Pseudomonas aeruginosa NOT DETECTED NOT DETECTED Final   Stenotrophomonas maltophilia NOT DETECTED NOT DETECTED Final   Candida albicans NOT DETECTED NOT DETECTED Final   Candida auris NOT DETECTED NOT DETECTED Final   Candida glabrata NOT DETECTED NOT DETECTED Final   Candida krusei NOT DETECTED NOT DETECTED Final   Candida parapsilosis NOT DETECTED NOT DETECTED Final   Candida tropicalis NOT DETECTED NOT DETECTED Final   Cryptococcus neoformans/gattii NOT DETECTED NOT DETECTED Final    Comment: Performed at [facility], [address], [place], [place] [address]  Culture, blood (Routine X 2) w Reflex to ID Panel     Status: None (Preliminary result)   Collection Time: 03/11/23  8:53 PM   Specimen: BLOOD  Result Value Ref Range Status   Specimen Description BLOOD BLOOD LEFT HAND  Final   Special Requests   Final    BOTTLES DRAWN AEROBIC AND ANAEROBIC Blood Culture results may not be optimal due to an inadequate volume of blood received in culture bottles   Culture   Final    NO GROWTH 3 DAYS Performed at [facility], [address], [place], [place] [address]    Report Status PENDING  Incomplete  Culture, blood (Routine X 2) w Reflex to ID Panel     Status: None (Preliminary result)   Collection Time: 03/11/23  8:53 PM   Specimen: BLOOD  Result Value Ref Range Status   Specimen Description BLOOD BLOOD LEFT ARM  Final   Special Requests   Final    BOTTLES DRAWN AEROBIC AND ANAEROBIC Blood Culture results may not be optimal due to an inadequate volume of blood received in culture bottles   Culture   Final    NO GROWTH 3 DAYS Performed at [facility], [address], [place], [place] [address]    Report Status PENDING  Incomplete     Marlan Silva, NP [facility]  03/14/2023  1:00 PM

## 2023-03-14 NOTE — Progress Notes (Addendum)
 PROGRESS NOTE    [name]  ZOX:[id number] DOB: [birth year] DOA: 03/08/2023 PCP: Minus Amel, MD  62/M with history of EtOH use, anxiety, thrombocytopenia, left nephrectomy following kidney stone complication, presented to the ED with edema and progressive dyspnea on exertion.  In the ER volume overloaded, hemoglobin 7.2, creatinine 0.6, BNP 211, chest x-ray with pulmonary edema, bilateral pleural effusions -Echo noted aortic valve vegetation -1/11 transferred to [facility] -Blood cultures with Streptococcus Infantarius, infectious disease consulting, started on penicillin  G for endocarditis -TEE 1/13 with vegetation on aortic valve, severe aortic regurgitation, mitral valve vegetation and severe mitral regurgitation -Also seen by orthopedics, infectious disease, oral surgery -T CTS consulting, plan for AVR and MVR  Subjective: -Feels okay, denies any complaints  Assessment and Plan:  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  -Has been on as needed Ativan  will decrease dose  Closed fracture of shaft of right ulna with delayed healing Pain control, continue splint Evaluated by orthopedics this admission No apparent post operative local infection.   Essential hypertension Stable  Thrombocytopenia (HCC) Pancytopenia has resolved.  Anemia of chronic disease.  Cell count has been stable.   DVT prophylaxis: Lovenox  Code  Status: Full code Family Communication: None present Disposition Plan: Home pending valve replacement  Consultants:    Procedures:   Antimicrobials:    Objective: Vitals:   03/14/23 0248 03/14/23 0500 03/14/23 0527 03/14/23 0646  BP:      Pulse:      Resp:  18    Temp:  98.7 F (37.1 C)    TempSrc:  Oral    SpO2: 97%  98%   Weight:    76.9 kg  Height:        Intake/Output Summary (Last 24 hours) at 03/14/2023 1020 Last data filed at 03/14/2023 0600 Gross per 24 hour  Intake 974 ml  Output 1320 ml  Net -346 ml   Filed Weights   03/12/23 0313 03/13/23 0500 03/14/23 0646  Weight: 77.3 kg 77.2 kg 76.9 kg    Examination:  General exam: Appears calm and comfortable  Respiratory system: Clear to auscultation Cardiovascular system: S1 & S2 heard, RRR.,  Systolic murmur Abd: nondistended, soft and nontender.Normal bowel sounds heard. Central nervous system: Alert and oriented. No focal neurological deficits. Extremities: Right arm in a splint Skin: No rashes Psychiatry:  Mood & affect appropriate.     Data Reviewed:   CBC: Recent Labs  Lab 03/08/23 0811 03/09/23 1032 03/11/23 0347 03/12/23 0349 03/13/23 0419 03/13/23 1850 03/14/23 0815  WBC 3.5*   < > 7.1 6.4 6.5 6.2 5.4  NEUTROABS 2.0  --   --   --   --   --   --   HGB 7.2*   < > 9.4* 9.3* 9.4* 9.2* 8.9*  HCT 23.3*   < > 29.2* 29.0* 28.8* 28.1* 27.5*  MCV 92.8   < > 87.4 88.4 86.7 86.5 87.3  PLT 146*   < > 144* 133* 138* 140* 128*   < > = values in this interval not displayed.  Basic Metabolic Panel: Recent Labs  Lab 03/09/23 0412 03/09/23 1032 03/10/23 0358 03/11/23 0347 03/12/23 0349 03/13/23 0419 03/13/23 1850 03/14/23 0815  NA  --    < > 133* 134* 130* 132*  --  133*  K  --    < > 3.6 3.9 4.0 3.9  --  3.3*  CL  --    < > 105 105 103 105  --  106  CO2  --    < > 24 22 20* 20*  --  21*  GLUCOSE  --    < > 108* 103* 90 100*  --  155*  BUN  --    < > 11 10 11 11   --  10  CREATININE  --     < > 0.73 0.86 0.79 0.70 0.70 0.69  CALCIUM   --    < > 8.3* 8.4* 8.3* 8.2*  --  8.2*  MG 1.7  --  1.8 2.0  --   --   --   --    < > = values in this interval not displayed.   GFR: Estimated Creatinine Clearance: 102 mL/min (by C-G formula based on SCr of 0.69 mg/dL). Liver Function Tests: Recent Labs  Lab 03/08/23 0811  AST 79*  ALT 34  ALKPHOS 96  BILITOT 0.8  PROT 7.0  ALBUMIN  2.5*   No results for input(s): "LIPASE", "AMYLASE" in the last 168 hours. No results for input(s): "AMMONIA" in the last 168 hours. Coagulation Profile: No results for input(s): "INR", "PROTIME" in the last 168 hours. Cardiac Enzymes: No results for input(s): "CKTOTAL", "CKMB", "CKMBINDEX", "TROPONINI" in the last 168 hours. BNP (last 3 results) No results for input(s): "PROBNP" in the last 8760 hours. HbA1C: No results for input(s): "HGBA1C" in the last 72 hours. CBG: No results for input(s): "GLUCAP" in the last 168 hours. Lipid Profile: No results for input(s): "CHOL", "HDL", "LDLCALC", "TRIG", "CHOLHDL", "LDLDIRECT" in the last 72 hours. Thyroid Function Tests: No results for input(s): "TSH", "T4TOTAL", "FREET4", "T3FREE", "THYROIDAB" in the last 72 hours. Anemia Panel: No results for input(s): "VITAMINB12", "FOLATE", "FERRITIN", "TIBC", "IRON", "RETICCTPCT" in the last 72 hours. Urine analysis:    Component Value Date/Time   COLORURINE YELLOW 01/08/2023 1720   APPEARANCEUR CLEAR 01/08/2023 1720   LABSPEC 1.015 01/08/2023 1720   PHURINE 5.0 01/08/2023 1720   GLUCOSEU NEGATIVE 01/08/2023 1720   HGBUR MODERATE (A) 01/08/2023 1720   BILIRUBINUR NEGATIVE 01/08/2023 1720   KETONESUR NEGATIVE 01/08/2023 1720   PROTEINUR NEGATIVE 01/08/2023 1720   UROBILINOGEN 0.2 02/27/2008 1111   NITRITE NEGATIVE 01/08/2023 1720   LEUKOCYTESUR TRACE (A) 01/08/2023 1720   Sepsis Labs: @LABRCNTIP (procalcitonin:4,lacticidven:4)  ) Recent Results (from the past 240 hours)  Resp panel by RT-PCR (RSV, Flu  A&B, Covid) Anterior Nasal Swab     Status: None   Collection Time: 03/08/23  8:20 AM   Specimen: Anterior Nasal Swab  Result Value Ref Range Status   SARS Coronavirus 2 by RT PCR NEGATIVE NEGATIVE Final    Comment: (NOTE) SARS-CoV-2 target nucleic acids are NOT DETECTED.  The SARS-CoV-2 RNA is generally detectable in upper respiratory specimens during the acute phase of infection. The lowest concentration of SARS-CoV-2 viral copies this assay can detect is 138 copies/mL. A negative result does not preclude SARS-Cov-2 infection and should not be used as the sole basis for treatment or other patient management decisions. A negative result may occur with  improper specimen collection/handling, submission of specimen other than nasopharyngeal  swab, presence of viral mutation(s) within the areas targeted by this assay, and inadequate number of viral copies(<138 copies/mL). A negative result must be combined with clinical observations, patient history, and epidemiological information. The expected result is Negative.  Fact Sheet for Patients:  BloggerCourse.com  Fact Sheet for Healthcare Providers:  SeriousBroker.it  This test is no t yet approved or cleared by the [place]  FDA and  has been authorized for detection and/or diagnosis of SARS-CoV-2 by FDA under an Emergency Use Authorization (EUA). This EUA will remain  in effect (meaning this test can be used) for the duration of the COVID-19 declaration under Section 564(b)(1) of the Act, 21 U.S.C.section 360bbb-3(b)(1), unless the authorization is terminated  or revoked sooner.       Influenza A by PCR NEGATIVE NEGATIVE Final   Influenza B by PCR NEGATIVE NEGATIVE Final    Comment: (NOTE) The Xpert Xpress SARS-CoV-2/FLU/RSV plus assay is intended as an aid in the diagnosis of influenza from Nasopharyngeal swab specimens and should not be used as a sole basis for treatment.  Nasal washings and aspirates are unacceptable for Xpert Xpress SARS-CoV-2/FLU/RSV testing.  Fact Sheet for Patients: BloggerCourse.com  Fact Sheet for Healthcare Providers: SeriousBroker.it  This test is not yet approved or cleared by the [place]  FDA and has been authorized for detection and/or diagnosis of SARS-CoV-2 by FDA under an Emergency Use Authorization (EUA). This EUA will remain in effect (meaning this test can be used) for the duration of the COVID-19 declaration under Section 564(b)(1) of the Act, 21 U.S.C. section 360bbb-3(b)(1), unless the authorization is terminated or revoked.     Resp Syncytial Virus by PCR NEGATIVE NEGATIVE Final    Comment: (NOTE) Fact Sheet for Patients: BloggerCourse.com  Fact Sheet for Healthcare Providers: SeriousBroker.it  This test is not yet approved or cleared by the [place]  FDA and has been authorized for detection and/or diagnosis of SARS-CoV-2 by FDA under an Emergency Use Authorization (EUA). This EUA will remain in effect (meaning this test can be used) for the duration of the COVID-19 declaration under Section 564(b)(1) of the Act, 21 U.S.C. section 360bbb-3(b)(1), unless the authorization is terminated or revoked.  Performed at [facility], [address] [place], [place] [address]   Culture, blood (Routine X 2) w Reflex to ID Panel     Status: Abnormal   Collection Time: 03/10/23 10:57 AM   Specimen: BLOOD  Result Value Ref Range Status   Specimen Description   Final    BLOOD BLOOD LEFT FOREARM Performed at [facility], [address] [place], [place] [address]    Special Requests   Final    BOTTLES DRAWN AEROBIC AND ANAEROBIC Blood Culture adequate volume Performed at [facility], [address], [place], [place] [address]    Culture  Setup Time   Final    GRAM POSITIVE COCCI ANAEROBIC BOTTLE ONLY Gram  Stain Report Called to,Read Back By and Verified With: N.DORMON ON  03/11/2023 @10 :19 BY T.HAMER CRITICAL RESULT CALLED TO, READ BACK BY AND VERIFIED WITH: PHARMD J LEFDFORD 03/12/2023 @ 0142 BY AB Performed at [facility], [address], [place], [place] [address]    Culture STREPTOCOCCUS INFANTARIUS (A)  Final   Report Status 03/14/2023 FINAL  Final   Organism ID, Bacteria STREPTOCOCCUS INFANTARIUS  Final      Susceptibility   Streptococcus infantarius - MIC*    ERYTHROMYCIN <=0.12 SENSITIVE Sensitive     TETRACYCLINE <=0.25 SENSITIVE Sensitive  VANCOMYCIN  0.25 SENSITIVE Sensitive     CLINDAMYCIN <=0.25 SENSITIVE Sensitive     PENICILLIN  <=0.06      CEFTAZIDIME <=0.12      * STREPTOCOCCUS INFANTARIUS  Culture, blood (Routine X 2) w Reflex to ID Panel     Status: None (Preliminary result)   Collection Time: 03/10/23 10:57 AM   Specimen: BLOOD  Result Value Ref Range Status   Specimen Description BLOOD BLOOD LEFT HAND  Final   Special Requests   Final    BOTTLES DRAWN AEROBIC AND ANAEROBIC Blood Culture adequate volume   Culture   Final    NO GROWTH 4 DAYS Performed at [facility], [address] [place], [place] [address]    Report Status PENDING  Incomplete  Blood Culture ID Panel (Reflexed)     Status: Abnormal   Collection Time: 03/10/23 10:57 AM  Result Value Ref Range Status   Enterococcus faecalis NOT DETECTED NOT DETECTED Final   Enterococcus Faecium NOT DETECTED NOT DETECTED Final   Listeria monocytogenes NOT DETECTED NOT DETECTED Final   Staphylococcus species NOT DETECTED NOT DETECTED Final   Staphylococcus aureus (BCID) NOT DETECTED NOT DETECTED Final   Staphylococcus epidermidis NOT DETECTED NOT DETECTED Final   Staphylococcus lugdunensis NOT DETECTED NOT DETECTED Final   Streptococcus species DETECTED (A) NOT DETECTED Final    Comment: Not Enterococcus species, Streptococcus agalactiae, Streptococcus pyogenes, or Streptococcus pneumoniae. CRITICAL  RESULT CALLED TO, READ BACK BY AND VERIFIED WITH: PHARMD J LEFDFORD 03/12/2023 @ 0142 BY AB    Streptococcus agalactiae NOT DETECTED NOT DETECTED Final   Streptococcus pneumoniae NOT DETECTED NOT DETECTED Final   Streptococcus pyogenes NOT DETECTED NOT DETECTED Final   A.calcoaceticus-baumannii NOT DETECTED NOT DETECTED Final   Bacteroides fragilis NOT DETECTED NOT DETECTED Final   Enterobacterales NOT DETECTED NOT DETECTED Final   Enterobacter cloacae complex NOT DETECTED NOT DETECTED Final   Escherichia coli NOT DETECTED NOT DETECTED Final   Klebsiella aerogenes NOT DETECTED NOT DETECTED Final   Klebsiella oxytoca NOT DETECTED NOT DETECTED Final   Klebsiella pneumoniae NOT DETECTED NOT DETECTED Final   Proteus species NOT DETECTED NOT DETECTED Final   Salmonella species NOT DETECTED NOT DETECTED Final   Serratia marcescens NOT DETECTED NOT DETECTED Final   Haemophilus influenzae NOT DETECTED NOT DETECTED Final   Neisseria meningitidis NOT DETECTED NOT DETECTED Final   Pseudomonas aeruginosa NOT DETECTED NOT DETECTED Final   Stenotrophomonas maltophilia NOT DETECTED NOT DETECTED Final   Candida albicans NOT DETECTED NOT DETECTED Final   Candida auris NOT DETECTED NOT DETECTED Final   Candida glabrata NOT DETECTED NOT DETECTED Final   Candida krusei NOT DETECTED NOT DETECTED Final   Candida parapsilosis NOT DETECTED NOT DETECTED Final   Candida tropicalis NOT DETECTED NOT DETECTED Final   Cryptococcus neoformans/gattii NOT DETECTED NOT DETECTED Final    Comment: Performed at [facility], [address] [place], [place] [address]  Culture, blood (Routine X 2) w Reflex to ID Panel     Status: None (Preliminary result)   Collection Time: 03/11/23  8:53 PM   Specimen: BLOOD  Result Value Ref Range Status   Specimen Description BLOOD BLOOD LEFT HAND  Final   Special Requests   Final    BOTTLES DRAWN AEROBIC AND ANAEROBIC Blood Culture results may not be optimal due to an  inadequate volume of blood received in culture bottles   Culture   Final    NO GROWTH 3 DAYS Performed at [facility], [address], [place], [place] [address]    Report Status PENDING  Incomplete  Culture, blood (Routine X 2) w Reflex to ID Panel     Status: None (Preliminary result)   Collection Time: 03/11/23  8:53 PM   Specimen: BLOOD  Result Value Ref Range Status   Specimen Description BLOOD BLOOD LEFT ARM  Final   Special Requests   Final    BOTTLES DRAWN AEROBIC AND ANAEROBIC Blood Culture results may not be optimal due to an inadequate volume of blood received in culture bottles   Culture   Final    NO GROWTH 3 DAYS Performed at [facility], [address], [place], [place] [address]    Report Status PENDING  Incomplete     Radiology Studies: CARDIAC CATHETERIZATION Result Date: 03/13/2023 Images from the original result were not included.   Moderate Single-Vessel Disease: Prox LAD to Mid LAD lesion is 60% stenosed with 60% stenosed side branch in 1st Diag. Mid LAD to Dist LAD lesion is 30% stenosed.  Dist LAD lesion is 50% stenosed. Dominance: Right The patient was hypotensive with central aortic pressures in the 70s to 80s during the procedure.  This improved with 500 mL bolus.  Post procedural radial arterial pressures were in the 110-115/48-50 mmHg.  This differs by at least 30 to 35 mmHg from the femoral arterial blood pressure cuff. RECOMMENDATIONS Patient will return to the nursing floor for ongoing care with CVTS consultation. LAD lesions are not likely flow-limiting and were not causing symptoms prior to this hospitalization.  Based on the extent of operation, would consider medical therapy with PCI at a later date if indicated by symptoms/stress test. Restart aspirin  81 mg daily Randene Bustard, MD  DG Orthopantogram Result Date: 03/13/2023 CLINICAL DATA:  Aortic insufficiency.  Endocarditis. EXAM: ORTHOPANTOGRAM/PANORAMIC COMPARISON:  None Available.  FINDINGS: No fracture or definite lytic abnormality is seen involving the mandible. IMPRESSION: No significant abnormality seen. Electronically Signed   By: Rosalene Colon M.D.   On: 03/13/2023 14:19   ECHO TEE Result Date: 03/12/2023    TRANSESOPHOGEAL ECHO REPORT   Patient Name:   KYLLIAN ERRANTE [facility] Date of Exam: 03/12/2023 Medical Rec #:  [id number]         Height:       71.0 in Accession #:    [id number]        Weight:       170.5 lb Date of Birth:  [birth year]         BSA:          1.970 m Patient Age:    62 years          BP:           147/58 mmHg Patient Gender: M                 HR:           100 bpm. Exam Location:  Inpatient Procedure: Transesophageal Echo, 3D Echo, Color Doppler and Cardiac Doppler Indications:     Endocarditis  History:         Patient has prior history of Echocardiogram examinations, most                  recent 03/09/2023. CHF; Risk Factors:Hypertension.  Sonographer:     Sherline Distel Senior RDCS Referring Phys:  [id number] Debria Fang Diagnosing Phys: Jackquelyn Mass MD PROCEDURE: After discussion of the risks and benefits of a  TEE, an informed consent was obtained from the patient. TEE procedure time was 22 minutes. The transesophogeal probe was passed without difficulty through the esophogus of the patient. Imaged were obtained with the patient in a left lateral decubitus position. Local oropharyngeal anesthetic was provided with Cetacaine. Sedation performed by different physician. The patient was monitored while under deep sedation. Anesthestetic sedation was provided intravenously by Anesthesiology: 487mg  of Propofol , 80mg  of Lidocaine . Image quality was excellent. The patient developed no complications during the procedure.  IMPRESSIONS  1. Aortic valve vegetation noted 0.6 cm x 0.8 cm located in the commissure between the NCC/LCC. There is severe aortic regurgitation that is eccentric and likely leaflet perforation. There is holodiastolic flow reversal in the descending aorta  consistent with severe AI. 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 Moderate stenosis, likely flow related in setting of severe AI. The aortic valve is tricuspid. There is moderate calcification of the aortic valve. There is moderate thickening of the aortic valve. Aortic valve regurgitation is severe. Moderate aortic valve stenosis. Aortic regurgitation PHT measures 393 msec. Aortic valve area, by VTI measures 2.69 cm. Aortic valve mean gradient measures 22.0 mmHg. Aortic valve Vmax measures 2.99 m/s.  2. Severe eccentric mitral regurgitation that is posteriorly directed. Vegetation noted on the A3 segment that measures 0.4 cm x 0.7 cm. There is also a flail A1/A2 segment with likely leaflet perforation. Findings consistent with endocarditis and significant valvular destruction. Systolic blunting in all 4 pulmonary veins. The mitral valve is abnormal. Severe mitral valve regurgitation. No evidence of mitral stenosis.  3. Left ventricular ejection fraction, by estimation, is 60 to 65%. The left ventricle has normal function.  4. Right ventricular systolic function is normal. The right ventricular size is normal.  5. Left atrial size was mildly dilated. No left atrial/left atrial appendage thrombus was detected. The LAA emptying velocity was 54 cm/s.  6. There is mild (Grade II) layered plaque involving the descending aorta.  7. Agitated saline contrast bubble study was positive with shunting observed after >6 cardiac cycles suggestive of intrapulmonary shunting. Conclusion(s)/Recommendation(s): Findings are concerning for vegetation/infective endocarditis as detailed above. FINDINGS  Left Ventricle: Left ventricular ejection fraction, by estimation, is 60 to 65%. The left ventricle has normal function. The left ventricular internal cavity size was normal in size. Right Ventricle: The right ventricular size is normal. No increase in right ventricular wall thickness. Right ventricular  systolic function is normal. Left Atrium: Left atrial size was mildly dilated. No left atrial/left atrial appendage thrombus was detected. The LAA emptying velocity was 54 cm/s. Right Atrium: Right atrial size was normal in size. Pericardium: There is no evidence of pericardial effusion. Mitral Valve: Severe eccentric mitral regurgitation that is posteriorly directed. Vegetation noted on the A3 segment that measures 0.4 cm x 0.7 cm. There is also a flail A1/A2 segment with likely leaflet perforation. Findings consistent with endocarditis  and significant valvular destruction. Systolic blunting in all 4 pulmonary veins. The mitral valve is abnormal. Severe mitral valve regurgitation. No evidence of mitral valve stenosis. MV peak gradient, 12.4 mmHg. The mean mitral valve gradient is 5.0 mmHg. Tricuspid Valve: The tricuspid valve is grossly normal. Tricuspid valve regurgitation is trivial. No evidence of tricuspid stenosis. There is no evidence of tricuspid valve vegetation. Aortic Valve: Aortic valve vegetation noted 0.6 cm x 0.8 cm located in the commissure between the NCC/LCC. There is severe aortic regurgitation that is eccentric and likely leaflet perforation. There is holodiastolic flow reversal in  the descending aorta  consistent with severe AI. 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 Moderate stenosis, likely flow related in setting of severe AI. The aortic valve is tricuspid. There is moderate calcification of the aortic valve. There is moderate thickening of the aortic valve. Aortic valve regurgitation is severe. Aortic regurgitation PHT measures 393 msec. Moderate aortic stenosis is present. Aortic valve mean gradient measures 22.0 mmHg. Aortic valve peak gradient measures 35.8 mmHg. Aortic valve area, by VTI measures 2.69 cm. Pulmonic Valve: The pulmonic valve was grossly normal. Pulmonic valve regurgitation is trivial. No evidence of pulmonic stenosis. There is no evidence of  pulmonic valve vegetation. Aorta: The aortic root and ascending aorta are structurally normal, with no evidence of dilitation. There is mild (Grade II) layered plaque involving the descending aorta. Venous: A systolic blunting flow pattern is recorded from the left upper pulmonary vein, the left lower pulmonary vein, the right upper pulmonary vein and the right lower pulmonary vein. IAS/Shunts: The atrial septum is grossly normal. Agitated saline contrast was given intravenously to evaluate for intracardiac shunting. Agitated saline contrast bubble study was positive with shunting observed after >6 cardiac cycles suggestive of intrapulmonary shunting. Additional Comments: Spectral Doppler performed. LEFT VENTRICLE PLAX 2D LVOT diam:     2.30 cm LV SV:         155 LV SV Index:   79 LVOT Area:     4.15 cm  AORTIC VALVE AV Area (Vmax):    2.75 cm AV Area (Vmean):   2.61 cm AV Area (VTI):     2.69 cm AV Vmax:           299.00 cm/s AV Vmean:          223.000 cm/s AV VTI:            0.578 m AV Peak Grad:      35.8 mmHg AV Mean Grad:      22.0 mmHg LVOT Vmax:         198.00 cm/s LVOT Vmean:        140.000 cm/s LVOT VTI:          0.374 m LVOT/AV VTI ratio: 0.65 AI PHT:            393 msec  AORTA Ao Root diam: 3.26 cm Ao Asc diam:  3.30 cm MITRAL VALVE MV Area VTI:  4.51 cm        SHUNTS MV Peak grad: 12.4 mmHg       Systemic VTI:  0.37 m MV Mean grad: 5.0 mmHg        Systemic Diam: 2.30 cm MV Vmax:      1.76 m/s MV Vmean:     103.0 cm/s MR Peak grad:    104.4 mmHg MR Mean grad:    72.0 mmHg MR Vmax:         511.00 cm/s MR Vmean:        403.0 cm/s MR PISA:         5.09 cm MR PISA Eff ROA: 35 mm MR PISA Radius:  0.90 cm Jackquelyn Mass MD Electronically signed by Jackquelyn Mass MD Signature Date/Time: 03/12/2023/12:34:06 PM    Final      Scheduled Meds:  aspirin  EC  81 mg Oral Daily   enoxaparin  (LOVENOX ) injection  40 mg Subcutaneous Q24H   feeding supplement  237 mL Oral BID BM   feeding supplement  237 mL Oral  BID BM   folic acid   1 mg Oral Daily   furosemide   40 mg Oral Daily   multivitamin with minerals  1 tablet Oral Daily   pantoprazole   40 mg Oral Daily   polyethylene glycol  17 g Oral Daily   sodium chloride  flush  3 mL Intravenous Q12H   spironolactone   12.5 mg Oral Daily   thiamine   100 mg Oral Daily   Or   thiamine   100 mg Intravenous Daily   Continuous Infusions:  sodium chloride      penicillin  G potassium 12 Million Units in dextrose  5 % 500 mL CONTINUOUS infusion       LOS: 6 days    Time spent:    Deforest Fast, MD [facility]   03/14/2023, 10:20 AM

## 2023-03-15 DIAGNOSIS — I5033 Acute on chronic diastolic (congestive) heart failure: Secondary | ICD-10-CM | POA: Diagnosis not present

## 2023-03-15 LAB — BASIC METABOLIC PANEL
Anion gap: 5 (ref 5–15)
BUN: 10 mg/dL (ref 8–23)
CO2: 22 mmol/L (ref 22–32)
Calcium: 8.5 mg/dL — ABNORMAL LOW (ref 8.9–10.3)
Chloride: 105 mmol/L (ref 98–111)
Creatinine, Ser: 0.66 mg/dL (ref 0.61–1.24)
GFR, Estimated: >60 mL/min (ref >60)
Glucose, Bld: 88 mg/dL (ref 70–99)
Potassium: 4.1 mmol/L (ref 3.5–5.1)
Sodium: 132 mmol/L — ABNORMAL LOW (ref 135–145)

## 2023-03-15 LAB — CULTURE, BLOOD (ROUTINE X 2): Special Requests: ADEQUATE

## 2023-03-15 LAB — LIPOPROTEIN A (LPA): Lipoprotein (a): 27.4 nmol/L (ref <75.0)

## 2023-03-15 LAB — PROTIME-INR
INR: 1.2 (ref 0.8–1.2)
Prothrombin Time: 15.5 s — ABNORMAL HIGH (ref 11.4–15.2)

## 2023-03-15 MED ORDER — TRAMADOL HCL 50 MG PO TABS
50.0000 mg | ORAL_TABLET | Freq: Two times a day (BID) | ORAL | Status: DC | PRN
  Administered 2023-03-15: 50 mg via ORAL
  Filled 2023-03-15 (×2): qty 1

## 2023-03-15 MED ORDER — POTASSIUM CHLORIDE 2 MEQ/ML IV SOLN
80.0000 meq | INTRAVENOUS | Status: DC
  Filled 2023-03-15: qty 40

## 2023-03-15 MED ORDER — HEPARIN 30,000 UNITS/1000 ML (OHS) CELLSAVER SOLUTION
Status: DC
  Filled 2023-03-15: qty 1000

## 2023-03-15 MED ORDER — PHENYLEPHRINE HCL-NACL 20-0.9 MG/250ML-% IV SOLN
30.0000 ug/min | INTRAVENOUS | Status: AC
  Administered 2023-03-16: 50 ug/min via INTRAVENOUS
  Filled 2023-03-15: qty 250

## 2023-03-15 MED ORDER — TRANEXAMIC ACID (OHS) PUMP PRIME SOLUTION
2.0000 mg/kg | INTRAVENOUS | Status: DC
  Filled 2023-03-15: qty 1.55

## 2023-03-15 MED ORDER — METOPROLOL TARTRATE 12.5 MG HALF TABLET
12.5000 mg | ORAL_TABLET | Freq: Once | ORAL | Status: AC
  Administered 2023-03-16: 12.5 mg via ORAL
  Filled 2023-03-15: qty 1

## 2023-03-15 MED ORDER — CEFAZOLIN SODIUM-DEXTROSE 2-4 GM/100ML-% IV SOLN
2.0000 g | INTRAVENOUS | Status: AC
  Administered 2023-03-16: 2 g via INTRAVENOUS
  Filled 2023-03-15: qty 100

## 2023-03-15 MED ORDER — EPINEPHRINE HCL 5 MG/250ML IV SOLN IN NS
0.0000 ug/min | INTRAVENOUS | Status: AC
  Administered 2023-03-16: 2 ug/min via INTRAVENOUS
  Filled 2023-03-15: qty 250

## 2023-03-15 MED ORDER — CHLORHEXIDINE GLUCONATE CLOTH 2 % EX PADS
6.0000 | MEDICATED_PAD | Freq: Once | CUTANEOUS | Status: AC
  Administered 2023-03-16: 6 via TOPICAL

## 2023-03-15 MED ORDER — INSULIN REGULAR(HUMAN) IN NACL 100-0.9 UT/100ML-% IV SOLN
INTRAVENOUS | Status: AC
  Administered 2023-03-16: 1.2 [IU]/h via INTRAVENOUS
  Filled 2023-03-15: qty 100

## 2023-03-15 MED ORDER — CHLORHEXIDINE GLUCONATE 0.12 % MT SOLN
15.0000 mL | Freq: Once | OROMUCOSAL | Status: AC
  Administered 2023-03-16: 15 mL via OROMUCOSAL
  Filled 2023-03-15: qty 15

## 2023-03-15 MED ORDER — MILRINONE LACTATE IN DEXTROSE 20-5 MG/100ML-% IV SOLN
0.3000 ug/kg/min | INTRAVENOUS | Status: DC
  Filled 2023-03-15: qty 100

## 2023-03-15 MED ORDER — NOREPINEPHRINE 4 MG/250ML-% IV SOLN
0.0000 ug/min | INTRAVENOUS | Status: AC
  Administered 2023-03-16: 2 ug/min via INTRAVENOUS
  Filled 2023-03-15: qty 250

## 2023-03-15 MED ORDER — PLASMA-LYTE A IV SOLN
INTRAVENOUS | Status: DC
  Filled 2023-03-15: qty 2.5

## 2023-03-15 MED ORDER — DIAZEPAM 5 MG PO TABS
5.0000 mg | ORAL_TABLET | Freq: Once | ORAL | Status: AC
  Administered 2023-03-16: 5 mg via ORAL
  Filled 2023-03-15: qty 1

## 2023-03-15 MED ORDER — BISACODYL 5 MG PO TBEC
5.0000 mg | DELAYED_RELEASE_TABLET | Freq: Once | ORAL | Status: AC
  Administered 2023-03-15: 5 mg via ORAL
  Filled 2023-03-15: qty 1

## 2023-03-15 MED ORDER — DEXMEDETOMIDINE HCL IN NACL 400 MCG/100ML IV SOLN
0.1000 ug/kg/h | INTRAVENOUS | Status: AC
  Administered 2023-03-16: .7 ug/kg/h via INTRAVENOUS
  Filled 2023-03-15: qty 100

## 2023-03-15 MED ORDER — MANNITOL 20 % IV SOLN
INTRAVENOUS | Status: DC
  Filled 2023-03-15: qty 13

## 2023-03-15 MED ORDER — VANCOMYCIN HCL 1250 MG/250ML IV SOLN
1250.0000 mg | INTRAVENOUS | Status: AC
  Administered 2023-03-16: 1250 mg via INTRAVENOUS
  Filled 2023-03-15: qty 250

## 2023-03-15 MED ORDER — TRANEXAMIC ACID (OHS) BOLUS VIA INFUSION
15.0000 mg/kg | INTRAVENOUS | Status: AC
  Administered 2023-03-16: 1162.5 mg via INTRAVENOUS
  Filled 2023-03-15: qty 1163

## 2023-03-15 MED ORDER — TRANEXAMIC ACID 1000 MG/10ML IV SOLN
1.5000 mg/kg/h | INTRAVENOUS | Status: AC
  Administered 2023-03-16: 1.5 mg/kg/h via INTRAVENOUS
  Filled 2023-03-15: qty 25

## 2023-03-15 MED ORDER — NITROGLYCERIN IN D5W 200-5 MCG/ML-% IV SOLN
2.0000 ug/min | INTRAVENOUS | Status: DC
  Filled 2023-03-15: qty 250

## 2023-03-15 MED ORDER — CHLORHEXIDINE GLUCONATE CLOTH 2 % EX PADS
6.0000 | MEDICATED_PAD | Freq: Once | CUTANEOUS | Status: AC
  Administered 2023-03-15: 6 via TOPICAL

## 2023-03-15 MED ORDER — OXYCODONE-ACETAMINOPHEN 5-325 MG PO TABS
1.0000 | ORAL_TABLET | Freq: Four times a day (QID) | ORAL | Status: DC | PRN
  Filled 2023-03-15: qty 1

## 2023-03-15 NOTE — TOC Initial Note (Signed)
Transition of Care [facility]) - Initial/Assessment Note    Patient Details  Name: Thomas [name] MRN: [id number] Date of Birth: [birth year]  Transition of Care [facility]) CM/SW Contact:    Gala Lewandowsky, RN Phone Number: 03/15/2023, 3:49 PM  Clinical Narrative:  Patient was discussed in progression rounds. Patient presented for acute CHF. PTA patient was independent from home with significant other. Patient planned for AVR 03-16-23. Case Manager will continue to follow for transition of care needs as the patient progresses.                Expected Discharge Plan: Home/Self Care Barriers to Discharge: Continued Medical Work up   Patient Goals and CMS Choice     Choice offered to / list presented to : NA      Expected Discharge Plan and Services In-house Referral: NA Discharge Planning Services: CM Consult   Living arrangements for the past 2 months: Single Family Home                   DME Agency: NA  Prior Living Arrangements/Services Living arrangements for the past 2 months: Single Family Home Lives with:: Significant Other Patient language and need for interpreter reviewed:: Yes        Need for Family Participation in Patient Care: Yes (Comment) Care giver support system in place?: Yes (comment)   Criminal Activity/Legal Involvement Pertinent to Current Situation/Hospitalization: No - Comment as needed  Activities of Daily Living   ADL Screening (condition at time of admission) Independently performs ADLs?: Yes (appropriate for developmental age) Is the patient deaf or have difficulty hearing?: No Does the patient have difficulty seeing, even when wearing glasses/contacts?: No Does the patient have difficulty concentrating, remembering, or making decisions?: No  Permission Sought/Granted Permission sought to share information with : Case Manager, Family Supports                Emotional Assessment Appearance:: Appears stated  age Attitude/Demeanor/Rapport: Engaged Affect (typically observed): Appropriate Orientation: : Oriented to Self, Oriented to Place, Oriented to  Time, Oriented to Situation Alcohol / Substance Use: Not Applicable Psych Involvement: No (comment)  Admission diagnosis:  Acute CHF (HCC) [I50.9] Systolic congestive heart failure, unspecified HF chronicity (HCC) [I50.20] Patient Active Problem List   Diagnosis Date Noted   Acute on chronic diastolic CHF (congestive heart failure) (HCC) 03/11/2023   Acute bacterial endocarditis 03/10/2023   Acute heart failure with preserved ejection fraction (HFpEF) (HCC) 03/10/2023   Acute CHF (HCC) 03/08/2023   Pancytopenia (HCC) 03/08/2023   Alcohol abuse 03/08/2023   Essential hypertension 03/08/2023   Closed fracture of shaft of right ulna with delayed healing 02/27/2023   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 [address] [place] [address] Phone: [phone] Fax: [phone]     Social Drivers of Health (SDOH) Social History: SDOH Screenings   Food Insecurity: No Food Insecurity (03/10/2023)  Housing: Low Risk  (03/10/2023)  Transportation Needs: No Transportation Needs (03/10/2023)  Utilities: Not At Risk (03/10/2023)  Social Connections: Socially Integrated (03/10/2023)  Tobacco Use: Low Risk  (03/12/2023)   SDOH Interventions:     Readmission Risk Interventions     No data to display

## 2023-03-15 NOTE — Progress Notes (Signed)
PROGRESS NOTE    Thomas [name] [facility]  ZOX:[id number] DOB: Oct 10, 1960 DOA: 03/08/2023 PCP: Assunta Found, MD  62/M with history of EtOH use, anxiety, thrombocytopenia, left nephrectomy following kidney stone complication, presented to the ED with edema and progressive dyspnea on exertion.  In the ER volume overloaded, hemoglobin 7.2, creatinine 0.6, BNP 211, chest x-ray with pulmonary edema, bilateral pleural effusions -Echo noted aortic valve vegetation -1/11 transferred to 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  -Ativan dose decreased  Hypokalemia Replaced  Closed fracture of shaft of right ulna with delayed healing Pain control, continue splint Seen by orthopedics, without concern for ongoing infection  Essential hypertension Stable  Thrombocytopenia (HCC) Pancytopenia has resolved.  Anemia of chronic disease.  Cell count has been stable.   DVT prophylaxis:  Lovenox Code Status: Full code Family Communication: None present Disposition Plan: Home pending valve replacement  Consultants:    Procedures:   Antimicrobials:    Objective: Vitals:   03/14/23 2023 03/15/23 0511 03/15/23 0830 03/15/23 1200  BP: (!) 128/46  (!) 152/53 (!) 150/56  Pulse: 88 79 79 97  Resp: 17 18 18 17   Temp: 98.3 F (36.8 C) 98.7 F (37.1 C) 98.3 F (36.8 C) 98.2 F (36.8 C)  TempSrc: Oral Oral Oral Oral  SpO2: 92% 95% 98%   Weight:  77.5 kg    Height:       No intake or output data in the 24 hours ending 03/15/23 1402  Filed Weights   03/13/23 0500 03/14/23 0646 03/15/23 0511  Weight: 77.2 kg 76.9 kg 77.5 kg    Examination:  General exam: Appears calm and comfortable  Respiratory system: Clear to auscultation Cardiovascular system: S1 & S2 heard, RRR.,  Systolic murmur Abd: nondistended, soft and nontender.Normal bowel sounds heard. Central nervous system: Alert and oriented. No focal neurological deficits. Extremities: Right arm in a splint Skin: No rashes Psychiatry:  Mood & affect appropriate.     Data Reviewed:   CBC: Recent Labs  Lab 03/11/23 0347 03/12/23 0349 03/13/23 0419 03/13/23 1850 03/14/23 0815  WBC 7.1 6.4 6.5 6.2 5.4  HGB 9.4* 9.3* 9.4* 9.2* 8.9*  HCT 29.2* 29.0* 28.8* 28.1* 27.5*  MCV 87.4 88.4 86.7 86.5 87.3  PLT 144* 133* 138* 140* 128*   Basic Metabolic Panel: Recent Labs  Lab 03/09/23 0412 03/09/23 1032 03/10/23 0358 03/11/23 0347 03/12/23 0349 03/13/23 0419 03/13/23 1850 03/14/23 0815 03/15/23 0805  NA  --    < > 133* 134* 130* 132*  --  133* 132*  K  --    < > 3.6 3.9 4.0 3.9  --  3.3* 4.1  CL  --    < >  105 105 103 105  --  106 105  CO2  --    < > 24 22 20* 20*  --  21* 22  GLUCOSE  --    < > 108* 103* 90 100*  --  155* 88  BUN  --    < > 11 10 11 11   --  10 10  CREATININE  --    < > 0.73 0.86 0.79 0.70 0.70 0.69 0.66  CALCIUM  --    < > 8.3* 8.4* 8.3* 8.2*  --  8.2* 8.5*  MG 1.7  --  1.8 2.0   --   --   --   --   --    < > = values in this interval not displayed.   GFR: Estimated Creatinine Clearance: 102 mL/min (by C-G formula based on SCr of 0.66 mg/dL). Liver Function Tests: No results for input(s): "AST", "ALT", "ALKPHOS", "BILITOT", "PROT", "ALBUMIN" in the last 168 hours.  No results for input(s): "LIPASE", "AMYLASE" in the last 168 hours. No results for input(s): "AMMONIA" in the last 168 hours. Coagulation Profile: No results for input(s): "INR", "PROTIME" in the last 168 hours. Cardiac Enzymes: No results for input(s): "CKTOTAL", "CKMB", "CKMBINDEX", "TROPONINI" in the last 168 hours. BNP (last 3 results) No results for input(s): "PROBNP" in the last 8760 hours. HbA1C: No results for input(s): "HGBA1C" in the last 72 hours. CBG: No results for input(s): "GLUCAP" in the last 168 hours. Lipid Profile: No results for input(s): "CHOL", "HDL", "LDLCALC", "TRIG", "CHOLHDL", "LDLDIRECT" in the last 72 hours. Thyroid Function Tests: No results for input(s): "TSH", "T4TOTAL", "FREET4", "T3FREE", "THYROIDAB" in the last 72 hours. Anemia Panel: No results for input(s): "VITAMINB12", "FOLATE", "FERRITIN", "TIBC", "IRON", "RETICCTPCT" in the last 72 hours. Urine analysis:    Component Value Date/Time   COLORURINE YELLOW 01/08/2023 1720   APPEARANCEUR CLEAR 01/08/2023 1720   LABSPEC 1.015 01/08/2023 1720   PHURINE 5.0 01/08/2023 1720   GLUCOSEU NEGATIVE 01/08/2023 1720   HGBUR MODERATE (A) 01/08/2023 1720   BILIRUBINUR NEGATIVE 01/08/2023 1720   KETONESUR NEGATIVE 01/08/2023 1720   PROTEINUR NEGATIVE 01/08/2023 1720   UROBILINOGEN 0.2 02/27/2008 1111   NITRITE NEGATIVE 01/08/2023 1720   LEUKOCYTESUR TRACE (A) 01/08/2023 1720   Sepsis Labs: @LABRCNTIP (procalcitonin:4,lacticidven:4)  ) Recent Results (from the past 240 hours)  Resp panel by RT-PCR (RSV, Flu A&B, Covid) Anterior Nasal Swab     Status: None   Collection Time: 03/08/23  8:20 AM   Specimen: Anterior  Nasal Swab  Result Value Ref Range Status   SARS Coronavirus 2 by RT PCR NEGATIVE NEGATIVE Final    Comment: (NOTE) SARS-CoV-2 target nucleic acids are NOT DETECTED.  The SARS-CoV-2 RNA is generally detectable in upper respiratory specimens during the acute phase of infection. The lowest concentration of SARS-CoV-2 viral copies this assay can detect is 138 copies/mL. A negative result does not preclude SARS-Cov-2 infection and should not be used as the sole basis for treatment or other patient management decisions. A negative result may occur with  improper specimen collection/handling, submission of specimen other than nasopharyngeal swab, presence of viral mutation(s) within the areas targeted by this assay, and inadequate number of viral copies(<138 copies/mL). A negative result must be combined with clinical observations, patient history, and epidemiological information. The expected result is Negative.  Fact Sheet for Patients:  BloggerCourse.com  Fact Sheet for Healthcare Providers:  SeriousBroker.it  This test is no t yet approved or cleared by the [place]  FDA and  has been authorized for detection and/or diagnosis of SARS-CoV-2 by FDA under an Emergency Use Authorization (EUA). This EUA will remain  in effect (meaning this test can be used) for the duration of the COVID-19 declaration under Section 564(b)(1) of the Act, 21 U.S.C.section 360bbb-3(b)(1), unless the authorization is terminated  or revoked sooner.       Influenza A by PCR NEGATIVE NEGATIVE Final   Influenza B by PCR NEGATIVE NEGATIVE Final    Comment: (NOTE) The Xpert Xpress SARS-CoV-2/FLU/RSV plus assay is intended as an aid in the diagnosis of influenza from Nasopharyngeal swab specimens and should not be used as a sole basis for treatment. Nasal washings and aspirates are unacceptable for Xpert Xpress SARS-CoV-2/FLU/RSV testing.  Fact Sheet for  Patients: BloggerCourse.com  Fact Sheet for Healthcare Providers: SeriousBroker.it  This test is not yet approved or cleared by the [place] FDA and has been authorized for detection and/or diagnosis of SARS-CoV-2 by FDA under an Emergency Use Authorization (EUA). This EUA will remain in effect (meaning this test can be used) for the duration of the COVID-19 declaration under Section 564(b)(1) of the Act, 21 U.S.C. section 360bbb-3(b)(1), unless the authorization is terminated or revoked.     Resp Syncytial Virus by PCR NEGATIVE NEGATIVE Final    Comment: (NOTE) Fact Sheet for Patients: BloggerCourse.com  Fact Sheet for Healthcare Providers: SeriousBroker.it  This test is not yet approved or cleared by the [place] FDA and has been authorized for detection and/or diagnosis of SARS-CoV-2 by FDA under an Emergency Use Authorization (EUA). This EUA will remain in effect (meaning this test can be used) for the duration of the COVID-19 declaration under Section 564(b)(1) of the Act, 21 U.S.C. section 360bbb-3(b)(1), unless the authorization is terminated or revoked.  Performed at [facility], [address] [place], [place] [address]   Culture, blood (Routine X 2) w Reflex to ID Panel     Status: Abnormal   Collection Time: 03/10/23 10:57 AM   Specimen: BLOOD  Result Value Ref Range Status   Specimen Description   Final    BLOOD BLOOD LEFT FOREARM Performed at [facility], [address], [place], [place] [address]    Special Requests   Final    BOTTLES DRAWN AEROBIC AND ANAEROBIC Blood Culture adequate volume Performed at [facility], [address], [place], [place] [address]    Culture  Setup Time   Final    GRAM POSITIVE COCCI ANAEROBIC BOTTLE ONLY Gram Stain Report Called to,Read Back By and Verified With: N.DORMON ON  03/11/2023 @10 :19 BY T.HAMER CRITICAL  RESULT CALLED TO, READ BACK BY AND VERIFIED WITH: PHARMD J LEFDFORD 03/12/2023 @ 0142 BY AB Performed at [facility], [address] [place], [place] [address]    Culture STREPTOCOCCUS INFANTARIUS (A)  Final   Report Status 03/14/2023 FINAL  Final   Organism ID, Bacteria STREPTOCOCCUS INFANTARIUS  Final      Susceptibility   Streptococcus infantarius - MIC*    ERYTHROMYCIN <=0.12 SENSITIVE Sensitive     TETRACYCLINE <=0.25 SENSITIVE Sensitive     VANCOMYCIN 0.25 SENSITIVE Sensitive     CLINDAMYCIN <=0.25 SENSITIVE Sensitive     PENICILLIN <=0.06      CEFTAZIDIME <=0.12      * STREPTOCOCCUS INFANTARIUS  Culture, blood (Routine X 2) w Reflex to ID Panel     Status: None   Collection Time: 03/10/23 10:57 AM   Specimen: BLOOD  Result Value Ref Range  Status   Specimen Description BLOOD BLOOD LEFT HAND  Final   Special Requests   Final    BOTTLES DRAWN AEROBIC AND ANAEROBIC Blood Culture adequate volume   Culture   Final    NO GROWTH 5 DAYS Performed at [facility], [address] [place], [place] [address]    Report Status 03/15/2023 FINAL  Final  Blood Culture ID Panel (Reflexed)     Status: Abnormal   Collection Time: 03/10/23 10:57 AM  Result Value Ref Range Status   Enterococcus faecalis NOT DETECTED NOT DETECTED Final   Enterococcus Faecium NOT DETECTED NOT DETECTED Final   Listeria monocytogenes NOT DETECTED NOT DETECTED Final   Staphylococcus species NOT DETECTED NOT DETECTED Final   Staphylococcus aureus (BCID) NOT DETECTED NOT DETECTED Final   Staphylococcus epidermidis NOT DETECTED NOT DETECTED Final   Staphylococcus lugdunensis NOT DETECTED NOT DETECTED Final   Streptococcus species DETECTED (A) NOT DETECTED Final    Comment: Not Enterococcus species, Streptococcus agalactiae, Streptococcus pyogenes, or Streptococcus pneumoniae. CRITICAL RESULT CALLED TO, READ BACK BY AND VERIFIED WITH: PHARMD J LEFDFORD 03/12/2023 @ 0142 BY AB    Streptococcus agalactiae NOT  DETECTED NOT DETECTED Final   Streptococcus pneumoniae NOT DETECTED NOT DETECTED Final   Streptococcus pyogenes NOT DETECTED NOT DETECTED Final   A.calcoaceticus-baumannii NOT DETECTED NOT DETECTED Final   Bacteroides fragilis NOT DETECTED NOT DETECTED Final   Enterobacterales NOT DETECTED NOT DETECTED Final   Enterobacter cloacae complex NOT DETECTED NOT DETECTED Final   Escherichia coli NOT DETECTED NOT DETECTED Final   Klebsiella aerogenes NOT DETECTED NOT DETECTED Final   Klebsiella oxytoca NOT DETECTED NOT DETECTED Final   Klebsiella pneumoniae NOT DETECTED NOT DETECTED Final   Proteus species NOT DETECTED NOT DETECTED Final   Salmonella species NOT DETECTED NOT DETECTED Final   Serratia marcescens NOT DETECTED NOT DETECTED Final   Haemophilus influenzae NOT DETECTED NOT DETECTED Final   Neisseria meningitidis NOT DETECTED NOT DETECTED Final   Pseudomonas aeruginosa NOT DETECTED NOT DETECTED Final   Stenotrophomonas maltophilia NOT DETECTED NOT DETECTED Final   Candida albicans NOT DETECTED NOT DETECTED Final   Candida auris NOT DETECTED NOT DETECTED Final   Candida glabrata NOT DETECTED NOT DETECTED Final   Candida krusei NOT DETECTED NOT DETECTED Final   Candida parapsilosis NOT DETECTED NOT DETECTED Final   Candida tropicalis NOT DETECTED NOT DETECTED Final   Cryptococcus neoformans/gattii NOT DETECTED NOT DETECTED Final    Comment: Performed at [facility], [address] [place], [place] [address]  Culture, blood (Routine X 2) w Reflex to ID Panel     Status: None (Preliminary result)   Collection Time: 03/11/23  8:53 PM   Specimen: BLOOD  Result Value Ref Range Status   Specimen Description BLOOD BLOOD LEFT HAND  Final   Special Requests   Final    BOTTLES DRAWN AEROBIC AND ANAEROBIC Blood Culture results may not be optimal due to an inadequate volume of blood received in culture bottles   Culture   Final    NO GROWTH 4 DAYS Performed at [facility],  [address], [place], [place] [address]    Report Status PENDING  Incomplete  Culture, blood (Routine X 2) w Reflex to ID Panel     Status: None (Preliminary result)   Collection Time: 03/11/23  8:53 PM   Specimen: BLOOD  Result Value Ref Range Status   Specimen Description BLOOD BLOOD LEFT ARM  Final  Special Requests   Final    BOTTLES DRAWN AEROBIC AND ANAEROBIC Blood Culture results may not be optimal due to an inadequate volume of blood received in culture bottles   Culture   Final    NO GROWTH 4 DAYS Performed at [facility], [address], [place], [place] [address]    Report Status PENDING  Incomplete     Radiology Studies: VAS US DOPPLER PRE CABG Result Date: 03/14/2023 PREOPERATIVE VASCULAR EVALUATION Patient Name:  CHIMAOBI GARGUS [facility]  Date of Exam:   03/14/2023 Medical Rec #: [id number]          Accession #:    [id number] Date of Birth: [birth year]          Patient Gender: M Patient Age:   61 years Exam Location:  [facility] Procedure:      VAS US DOPPLER PRE CABG Referring Phys: DAVID HARDING --------------------------------------------------------------------------------  Indications:      Pre-op AVR/MVR. Risk Factors:     Hypertension, no history of smoking. Other Factors:    CHF. Comparison Study: No previous exams Performing Technologist: Jody Hill RVT, RDMS  Examination Guidelines: A complete evaluation includes B-mode imaging, spectral Doppler, color Doppler, and power Doppler as needed of all accessible portions of each vessel. Bilateral testing is considered an integral part of a complete examination. Limited examinations for reoccurring indications may be performed as noted.  Right Carotid Findings: +----------+--------+--------+--------+------------------+------------------+           PSV cm/sEDV cm/sStenosisDescribe          Comments           +----------+--------+--------+--------+------------------+------------------+ CCA Prox  157     0                                                     +----------+--------+--------+--------+------------------+------------------+ CCA Distal148     12              heterogenous      intimal thickening +----------+--------+--------+--------+------------------+------------------+ ICA Prox  122     25      1-39%   focal and calcific                   +----------+--------+--------+--------+------------------+------------------+ ICA Distal132     25                                                   +----------+--------+--------+--------+------------------+------------------+ ECA       175     0                                                    +----------+--------+--------+--------+------------------+------------------+ +----------+--------+-------+----------------+------------+           PSV cm/sEDV cmsDescribe        Arm Pressure +----------+--------+-------+----------------+------------+ Subclavian271            Multiphasic, WNL             +----------+--------+-------+----------------+------------+ +---------+--------+--+--------+--+---------+ VertebralPSV cm/s95EDV cm/s20Antegrade +---------+--------+--+--------+--+---------+ Elevated velocities seen throughout without evidence of significant stenosis. Left Carotid  Findings: +----------+--------+--------+--------+--------+------------------+           PSV cm/sEDV cm/sStenosisDescribeComments           +----------+--------+--------+--------+--------+------------------+ CCA Prox  131     12                                         +----------+--------+--------+--------+--------+------------------+ CCA Distal155     10                      intimal thickening +----------+--------+--------+--------+--------+------------------+ ICA Prox  150     21      1-39%                              +----------+--------+--------+--------+--------+------------------+ ICA Distal134     31                                          +----------+--------+--------+--------+--------+------------------+ ECA       122     0                       intimal thickening +----------+--------+--------+--------+--------+------------------+ +----------+--------+--------+----------------+------------+ SubclavianPSV cm/sEDV cm/sDescribe        Arm Pressure +----------+--------+--------+----------------+------------+           194             Multiphasic, WNL             +----------+--------+--------+----------------+------------+ +---------+--------+---+--------+--+ VertebralPSV cm/s100EDV cm/s21 +---------+--------+---+--------+--+ Elevated velocities seen throughout without evidence of significant stenosis.   Right Doppler Findings: +-----------+---------+-------------------+ Site       Doppler  Comments            +-----------+---------+-------------------+ Brachial   triphasic                    +-----------+---------+-------------------+ Radial              not visualiazed   +-----------+---------+-------------------+ Ulnar               not visualiazed   +-----------+---------+-------------------+ Palmar Arch         not visualiazed   +-----------+---------+-------------------+  Left Doppler Findings: +-----------+---------+ Site       Doppler   +-----------+---------+ Brachial   triphasic +-----------+---------+ Radial     triphasic +-----------+---------+ Ulnar      triphasic +-----------+---------+ Palmar Archtriphasic +-----------+---------+  Technologist Notes:   Unable to visualize radial, ulnar, and palmer arch arteries due to cast placement.  Summary: Right Carotid: Velocities in the right ICA are consistent with a 1-39% stenosis. Left Carotid: Velocities in the left ICA are consistent with a 1-39% stenosis. Left Upper Extremity: Doppler waveforms remain within normal limits with left radial compression. Doppler waveforms remain within normal limits with left ulnar compression.   Electronically signed by Coral Else MD on 03/14/2023 at 8:33:01 PM.    Final    CARDIAC CATHETERIZATION Result Date: 03/13/2023 Images from the original result were not included.   Moderate Single-Vessel Disease: Prox LAD to Mid LAD lesion is 60% stenosed with 60% stenosed side branch in 1st Diag. Mid LAD to Dist LAD lesion is 30% stenosed.  Dist LAD lesion is 50% stenosed. Dominance: Right The patient was hypotensive with central aortic pressures  in the 70s to 80s during the procedure.  This improved with 500 mL bolus.  Post procedural radial arterial pressures were in the 110-115/48-50 mmHg.  This differs by at least 30 to 35 mmHg from the femoral arterial blood pressure cuff. RECOMMENDATIONS Patient will return to the nursing floor for ongoing care with CVTS consultation. LAD lesions are not likely flow-limiting and were not causing symptoms prior to this hospitalization.  Based on the extent of operation, would consider medical therapy with PCI at a later date if indicated by symptoms/stress test. Restart aspirin 81 mg daily Bryan Lemma, MD    Scheduled Meds:  aspirin EC  81 mg Oral Daily   atorvastatin  40 mg Oral Daily   enoxaparin (LOVENOX) injection  40 mg Subcutaneous Q24H   [START ON 03/16/2023] epinephrine  0-10 mcg/min Intravenous To OR   feeding supplement  237 mL Oral BID BM   feeding supplement  237 mL Oral BID BM   folic acid  1 mg Oral Daily   [START ON 03/16/2023] heparin sodium (porcine) 2,500 Units, papaverine 30 mg in electrolyte-A (PLASMALYTE-A PH 7.4) 500 mL irrigation   Irrigation To OR   [START ON 03/16/2023] insulin   Intravenous To OR   [START ON 03/16/2023] [place] Blood Cardioplegia vial (lidocaine/magnesium/mannitol 0.26g-4g-6.4g)   Intracoronary To OR   multivitamin with minerals  1 tablet Oral Daily   pantoprazole  40 mg Oral Daily   [START ON 03/16/2023] phenylephrine  30-200 mcg/min Intravenous To OR   polyethylene glycol  17 g Oral Daily   [START ON  03/16/2023] potassium chloride  80 mEq Other To OR   sodium chloride flush  3 mL Intravenous Q12H   spironolactone  25 mg Oral Daily   thiamine  100 mg Oral Daily   Or   thiamine  100 mg Intravenous Daily   [START ON 03/16/2023] tranexamic acid  15 mg/kg Intravenous To OR   [START ON 03/16/2023] tranexamic acid  2 mg/kg Intracatheter To OR   Continuous Infusions:  [START ON 03/16/2023]  ceFAZolin (ANCEF) IV     [START ON 03/16/2023]  ceFAZolin (ANCEF) IV     [START ON 03/16/2023] dexmedetomidine     [START ON 03/16/2023] heparin 30,000 units/NS 1000 mL solution for CELLSAVER     [START ON 03/16/2023] milrinone     [START ON 03/16/2023] nitroGLYCERIN     [START ON 03/16/2023] norepinephrine     penicillin G potassium 12 Million Units in sodium chloride 0.9 % 500 mL CONTINUOUS infusion 12 Million Units (03/15/23 1101)   [START ON 03/16/2023] tranexamic acid (CYKLOKAPRON) 2,500 mg in sodium chloride 0.9 % 250 mL (10 mg/mL) infusion     [START ON 03/16/2023] vancomycin       LOS: 7 days    Time spent:    Zannie Cove, MD [facility]   03/15/2023, 2:02 PM

## 2023-03-15 NOTE — Anesthesia Preprocedure Evaluation (Signed)
Anesthesia Evaluation  Patient identified by MRN, date of birth, ID band Patient awake    Reviewed: Allergy & Precautions, H&P , NPO status , Patient's Chart, lab work & pertinent test results  Airway Mallampati: II  TM Distance: >3 FB Neck ROM: Full    Dental no notable dental hx.    Pulmonary neg pulmonary ROS   Pulmonary exam normal breath sounds clear to auscultation       Cardiovascular hypertension, Normal cardiovascular exam Rhythm:Regular Rate:Normal  Endocarditis Severe AI and MR with valve vegetation.    Neuro/Psych negative neurological ROS  negative psych ROS   GI/Hepatic Neg liver ROS,GERD  ,,  Endo/Other  negative endocrine ROS    Renal/GU negative Renal ROS  negative genitourinary   Musculoskeletal negative musculoskeletal ROS (+)    Abdominal   Peds negative pediatric ROS (+)  Hematology  (+) Blood dyscrasia, anemia thromocytopenia   Anesthesia Other Findings   Reproductive/Obstetrics negative OB ROS                              Anesthesia Physical Anesthesia Plan  ASA: 4  Anesthesia Plan: General   Post-op Pain Management:    Induction: Intravenous  PONV Risk Score and Plan: Ondansetron and Treatment may vary due to age or medical condition  Airway Management Planned: Oral ETT  Additional Equipment: Arterial line, CVP, PA Cath, 3D TEE and Ultrasound Guidance Line Placement  Intra-op Plan:   Post-operative Plan: Post-operative intubation/ventilation  Informed Consent: I have reviewed the patients History and Physical, chart, labs and discussed the procedure including the risks, benefits and alternatives for the proposed anesthesia with the patient or authorized representative who has indicated his/her understanding and acceptance.     Dental advisory given  Plan Discussed with: CRNA  Anesthesia Plan Comments:          Anesthesia Quick  Evaluation

## 2023-03-15 NOTE — Progress Notes (Addendum)
   Patient Name: Nethaniel Cifuentes [facility] Date of Encounter: 03/15/2023 [facility] Cardiologist: None   Interval Summary  .    Patient resting comfortably in bed. Denies any symptoms.  Scheduled for AVR/MVR tomorrow 1/17  Vital Signs .    Vitals:   03/14/23 1512 03/14/23 2023 03/15/23 0511 03/15/23 0830  BP: (!) 153/42 (!) 128/46  (!) 152/53  Pulse: 91 88 79 79  Resp: 16 17 18 18   Temp: 99.2 F (37.3 C) 98.3 F (36.8 C) 98.7 F (37.1 C) 98.3 F (36.8 C)  TempSrc: Oral Oral Oral Oral  SpO2: 94% 92% 95% 98%  Weight:   77.5 kg   Height:        Intake/Output Summary (Last 24 hours) at 03/15/2023 0919 Last data filed at 03/14/2023 1339 Gross per 24 hour  Intake 118 ml  Output --  Net 118 ml      03/15/2023    5:11 AM 03/14/2023    6:46 AM 03/13/2023    5:00 AM  Last 3 Weights  Weight (lbs) 170 lb 12.8 oz 169 lb 8 oz 170 lb 4.8 oz  Weight (kg) 77.474 kg 76.885 kg 77.248 kg     Telemetry/ECG    Sinus rhythm, HR 90s - Personally Reviewed  Physical Exam .   GEN: No acute distress, resting comfortably in bed, on RA.   Neck: No JVD Cardiac: RRR, no murmurs.  Respiratory: Clear to auscultation bilaterally. GI: Soft, nontender, non-distended  MS: No edema, right forearm in cast   Assessment & Plan .     Severe aortic insufficiency and mitral regurgitation with valve vegetations Acute bacterial endocarditis  CAD TEE which showed aortic and mitral valve vegetations and regurgitation. Blood cultures positive for strep infantarius formerly known as strep bovis  >> WILL NEED OUTPATIENT COLONOSCOPY AFTER CARDIAC SURGERY Cardiac cath showed moderate single-vessel CAD of proximal and mid LAD and 60%, recommendations for medical therapy as lesions were not flow limiting -- Antibiotic regimen per infectious disease  -- Continue ASA 81 mg daily -- Continue Lipitor 40 mg daily  -- Scheduled for mitral and aortic valve replacement 1/17  Acute on chronic diastolic heart  failure  Stopped Lasix 1/15 -- Continue spironolactone 25 mg daily   Hypertension  Recent BP reading 152/53  -- Continue spironolactone 25 mg daily  -- Consider adding on additional medications post surgery  Hypokalemia Potassium yesterday 3.3  Given 40 mEq PO yesterday  Waiting updated BMP today   Per primary Alcohol abuse  Thrombocytopenia  Closed fracture of R ulna, delayed healing   For questions or updates, please contact [facility] Please consult [email or web address] for contact info under     Signed, Olena Leatherwood, PA-C     ATTENDING ATTESTATION:  After conducting a review of all available clinical information with the care team, interviewing the patient, and performing a physical exam, I agree with the findings and plan described in this note.   GEN: No acute distress.   HEENT:  MMM, no JVD, no scleral icterus Cardiac: RRR, 3/6 holosystolic murmur  Respiratory: Clear to auscultation bilaterally. GI: Soft, nontender, non-distended  MS: No edema; No deformity. Neuro:  Nonfocal  Vasc:  +2 radial pulses  Patient remains stable without signs or symptoms of heart failure.  OR tomorrow for AV and MV endocarditis.  Stressed need for colonoscopy in the future given Strep Bovis infection.  Patient understands.   Alverda Skeans, MD Pager [phone]

## 2023-03-15 NOTE — Progress Notes (Addendum)
CARDIAC REHAB PHASE I      Pre-op OHS education including OHS booklet, OHS handout, IS use( 1000 today) mobility importance, home needs at discharge and sternal precautions/move in the tube reviewed. All questions and concerns addressed. Will continue to follow.  [phone] Woodroe Chen, RN BSN 03/15/2023 9:10 AM

## 2023-03-15 NOTE — Plan of Care (Signed)
  Problem: Education: Goal: Knowledge of General Education information will improve Description: Including pain rating scale, medication(s)/side effects and non-pharmacologic comfort measures Outcome: Progressing   Problem: Health Behavior/Discharge Planning: Goal: Ability to manage health-related needs will improve Outcome: Progressing   Problem: Clinical Measurements: Goal: Ability to maintain clinical measurements within normal limits will improve Outcome: Progressing Goal: Will remain free from infection Outcome: Progressing Goal: Diagnostic test results will improve Outcome: Progressing Goal: Respiratory complications will improve Outcome: Progressing Goal: Cardiovascular complication will be avoided Outcome: Progressing   Problem: Activity: Goal: Risk for activity intolerance will decrease Outcome: Progressing   Problem: Nutrition: Goal: Adequate nutrition will be maintained Outcome: Progressing   Problem: Coping: Goal: Level of anxiety will decrease Outcome: Progressing   Problem: Elimination: Goal: Will not experience complications related to bowel motility Outcome: Progressing Goal: Will not experience complications related to urinary retention Outcome: Progressing   Problem: Pain Management: Goal: General experience of comfort will improve Outcome: Progressing   Problem: Safety: Goal: Ability to remain free from injury will improve Outcome: Progressing   Problem: Skin Integrity: Goal: Risk for impaired skin integrity will decrease Outcome: Progressing   Problem: Education: Goal: Ability to demonstrate management of disease process will improve Outcome: Progressing Goal: Ability to verbalize understanding of medication therapies will improve Outcome: Progressing Goal: Individualized Educational Video(s) Outcome: Progressing   Problem: Activity: Goal: Capacity to carry out activities will improve Outcome: Progressing   Problem: Cardiac: Goal:  Ability to achieve and maintain adequate cardiopulmonary perfusion will improve Outcome: Progressing   Problem: Education: Goal: Understanding of CV disease, CV risk reduction, and recovery process will improve Outcome: Progressing Goal: Individualized Educational Video(s) Outcome: Progressing   Problem: Activity: Goal: Ability to return to baseline activity level will improve Outcome: Progressing   Problem: Cardiovascular: Goal: Ability to achieve and maintain adequate cardiovascular perfusion will improve Outcome: Progressing Goal: Vascular access site(s) Level 0-1 will be maintained Outcome: Progressing   Problem: Health Behavior/Discharge Planning: Goal: Ability to safely manage health-related needs after discharge will improve Outcome: Progressing

## 2023-03-16 ENCOUNTER — Inpatient Hospital Stay (HOSPITAL_COMMUNITY): Payer: 59 | Admitting: Anesthesiology

## 2023-03-16 ENCOUNTER — Inpatient Hospital Stay (HOSPITAL_COMMUNITY): Payer: 59

## 2023-03-16 ENCOUNTER — Other Ambulatory Visit: Payer: Self-pay

## 2023-03-16 ENCOUNTER — Encounter (HOSPITAL_COMMUNITY): Payer: Self-pay | Admitting: Internal Medicine

## 2023-03-16 ENCOUNTER — Encounter (HOSPITAL_COMMUNITY)
Admission: EM | Disposition: A | Discharge status: Home Health | Payer: Self-pay | Source: Home / Self Care | Attending: Thoracic Surgery (Cardiothoracic Vascular Surgery)

## 2023-03-16 DIAGNOSIS — I5033 Acute on chronic diastolic (congestive) heart failure: Secondary | ICD-10-CM

## 2023-03-16 DIAGNOSIS — I339 Acute and subacute endocarditis, unspecified: Secondary | ICD-10-CM | POA: Diagnosis not present

## 2023-03-16 DIAGNOSIS — I340 Nonrheumatic mitral (valve) insufficiency: Secondary | ICD-10-CM | POA: Diagnosis not present

## 2023-03-16 DIAGNOSIS — G9340 Encephalopathy, unspecified: Secondary | ICD-10-CM

## 2023-03-16 DIAGNOSIS — I351 Nonrheumatic aortic (valve) insufficiency: Secondary | ICD-10-CM

## 2023-03-16 DIAGNOSIS — I080 Rheumatic disorders of both mitral and aortic valves: Secondary | ICD-10-CM | POA: Diagnosis not present

## 2023-03-16 DIAGNOSIS — I110 Hypertensive heart disease with heart failure: Secondary | ICD-10-CM | POA: Diagnosis not present

## 2023-03-16 DIAGNOSIS — I330 Acute and subacute infective endocarditis: Secondary | ICD-10-CM

## 2023-03-16 DIAGNOSIS — Z952 Presence of prosthetic heart valve: Secondary | ICD-10-CM

## 2023-03-16 HISTORY — PX: MITRAL VALVE REPLACEMENT: SHX147

## 2023-03-16 HISTORY — PX: AORTIC VALVE REPLACEMENT: SHX41

## 2023-03-16 HISTORY — PX: TEE WITHOUT CARDIOVERSION: SHX5443

## 2023-03-16 LAB — POCT I-STAT 7, (LYTES, BLD GAS, ICA,H+H)
Acid-base deficit: 4 mmol/L — ABNORMAL HIGH (ref 0.0–2.0)
Acid-base deficit: 2 mmol/L (ref 0.0–2.0)
Acid-base deficit: 4 mmol/L — ABNORMAL HIGH (ref 0.0–2.0)
Acid-base deficit: 4 mmol/L — ABNORMAL HIGH (ref 0.0–2.0)
Acid-base deficit: 4 mmol/L — ABNORMAL HIGH (ref 0.0–2.0)
Acid-base deficit: 3 mmol/L — ABNORMAL HIGH (ref 0.0–2.0)
Bicarbonate: 20.6 mmol/L (ref 20.0–28.0)
Bicarbonate: 23.1 mmol/L (ref 20.0–28.0)
Bicarbonate: 21.9 mmol/L (ref 20.0–28.0)
Bicarbonate: 21.5 mmol/L (ref 20.0–28.0)
Bicarbonate: 22.9 mmol/L (ref 20.0–28.0)
Bicarbonate: 22.8 mmol/L (ref 20.0–28.0)
Calcium, Ion: 1.06 mmol/L — ABNORMAL LOW (ref 1.15–1.40)
Calcium, Ion: 1.21 mmol/L (ref 1.15–1.40)
Calcium, Ion: 1.16 mmol/L (ref 1.15–1.40)
Calcium, Ion: 1.28 mmol/L (ref 1.15–1.40)
Calcium, Ion: 1.29 mmol/L (ref 1.15–1.40)
Calcium, Ion: 1.27 mmol/L (ref 1.15–1.40)
HCT: 21 % — ABNORMAL LOW (ref 39.0–52.0)
HCT: 24 % — ABNORMAL LOW (ref 39.0–52.0)
HCT: 26 % — ABNORMAL LOW (ref 39.0–52.0)
HCT: 21 % — ABNORMAL LOW (ref 39.0–52.0)
HCT: 21 % — ABNORMAL LOW (ref 39.0–52.0)
HCT: 22 % — ABNORMAL LOW (ref 39.0–52.0)
Hemoglobin: 7.1 g/dL — ABNORMAL LOW (ref 13.0–17.0)
Hemoglobin: 8.2 g/dL — ABNORMAL LOW (ref 13.0–17.0)
Hemoglobin: 8.8 g/dL — ABNORMAL LOW (ref 13.0–17.0)
Hemoglobin: 7.1 g/dL — ABNORMAL LOW (ref 13.0–17.0)
Hemoglobin: 7.1 g/dL — ABNORMAL LOW (ref 13.0–17.0)
Hemoglobin: 7.5 g/dL — ABNORMAL LOW (ref 13.0–17.0)
O2 Saturation: 100 %
O2 Saturation: 99 %
O2 Saturation: 93 %
O2 Saturation: 94 %
O2 Saturation: 99 %
O2 Saturation: 100 %
Patient temperature: 35.1
Patient temperature: 36.2
Patient temperature: 36.4
Patient temperature: 36.8
Potassium: 4.9 mmol/L (ref 3.5–5.1)
Potassium: 4.9 mmol/L (ref 3.5–5.1)
Potassium: 4.1 mmol/L (ref 3.5–5.1)
Potassium: 3.8 mmol/L (ref 3.5–5.1)
Potassium: 3.9 mmol/L (ref 3.5–5.1)
Potassium: 4.1 mmol/L (ref 3.5–5.1)
Sodium: 135 mmol/L (ref 135–145)
Sodium: 137 mmol/L (ref 135–145)
Sodium: 140 mmol/L (ref 135–145)
Sodium: 141 mmol/L (ref 135–145)
Sodium: 141 mmol/L (ref 135–145)
Sodium: 139 mmol/L (ref 135–145)
TCO2: 22 mmol/L (ref 22–32)
TCO2: 24 mmol/L (ref 22–32)
TCO2: 23 mmol/L (ref 22–32)
TCO2: 23 mmol/L (ref 22–32)
TCO2: 24 mmol/L (ref 22–32)
TCO2: 24 mmol/L (ref 22–32)
pCO2 arterial: 35.9 mm[Hg] (ref 32–48)
pCO2 arterial: 41 mm[Hg] (ref 32–48)
pCO2 arterial: 38.5 mm[Hg] (ref 32–48)
pCO2 arterial: 41.4 mm[Hg] (ref 32–48)
pCO2 arterial: 46.6 mm[Hg] (ref 32–48)
pCO2 arterial: 40.9 mm[Hg] (ref 32–48)
pH, Arterial: 7.367 (ref 7.35–7.45)
pH, Arterial: 7.358 (ref 7.35–7.45)
pH, Arterial: 7.355 (ref 7.35–7.45)
pH, Arterial: 7.295 — ABNORMAL LOW (ref 7.35–7.45)
pH, Arterial: 7.321 — ABNORMAL LOW (ref 7.35–7.45)
pH, Arterial: 7.353 (ref 7.35–7.45)
pO2, Arterial: 374 mm[Hg] — ABNORMAL HIGH (ref 83–108)
pO2, Arterial: 134 mm[Hg] — ABNORMAL HIGH (ref 83–108)
pO2, Arterial: 64 mm[Hg] — ABNORMAL LOW (ref 83–108)
pO2, Arterial: 144 mm[Hg] — ABNORMAL HIGH (ref 83–108)
pO2, Arterial: 73 mm[Hg] — ABNORMAL LOW (ref 83–108)
pO2, Arterial: 214 mm[Hg] — ABNORMAL HIGH (ref 83–108)

## 2023-03-16 LAB — POCT I-STAT, CHEM 8
BUN: 10 mg/dL (ref 8–23)
BUN: 10 mg/dL (ref 8–23)
BUN: 10 mg/dL (ref 8–23)
BUN: 12 mg/dL (ref 8–23)
BUN: 12 mg/dL (ref 8–23)
BUN: 11 mg/dL (ref 8–23)
Calcium, Ion: 1.25 mmol/L (ref 1.15–1.40)
Calcium, Ion: 1.21 mmol/L (ref 1.15–1.40)
Calcium, Ion: 0.99 mmol/L — ABNORMAL LOW (ref 1.15–1.40)
Calcium, Ion: 1 mmol/L — ABNORMAL LOW (ref 1.15–1.40)
Calcium, Ion: 1.02 mmol/L — ABNORMAL LOW (ref 1.15–1.40)
Calcium, Ion: 1.21 mmol/L (ref 1.15–1.40)
Chloride: 104 mmol/L (ref 98–111)
Chloride: 105 mmol/L (ref 98–111)
Chloride: 101 mmol/L (ref 98–111)
Chloride: 103 mmol/L (ref 98–111)
Chloride: 103 mmol/L (ref 98–111)
Chloride: 104 mmol/L (ref 98–111)
Creatinine, Ser: 0.4 mg/dL — ABNORMAL LOW (ref 0.61–1.24)
Creatinine, Ser: 0.6 mg/dL — ABNORMAL LOW (ref 0.61–1.24)
Creatinine, Ser: 0.5 mg/dL — ABNORMAL LOW (ref 0.61–1.24)
Creatinine, Ser: 0.6 mg/dL — ABNORMAL LOW (ref 0.61–1.24)
Creatinine, Ser: 0.6 mg/dL — ABNORMAL LOW (ref 0.61–1.24)
Creatinine, Ser: 0.6 mg/dL — ABNORMAL LOW (ref 0.61–1.24)
Glucose, Bld: 99 mg/dL (ref 70–99)
Glucose, Bld: 103 mg/dL — ABNORMAL HIGH (ref 70–99)
Glucose, Bld: 128 mg/dL — ABNORMAL HIGH (ref 70–99)
Glucose, Bld: 145 mg/dL — ABNORMAL HIGH (ref 70–99)
Glucose, Bld: 147 mg/dL — ABNORMAL HIGH (ref 70–99)
Glucose, Bld: 126 mg/dL — ABNORMAL HIGH (ref 70–99)
HCT: 26 % — ABNORMAL LOW (ref 39.0–52.0)
HCT: 24 % — ABNORMAL LOW (ref 39.0–52.0)
HCT: 24 % — ABNORMAL LOW (ref 39.0–52.0)
HCT: 26 % — ABNORMAL LOW (ref 39.0–52.0)
HCT: 26 % — ABNORMAL LOW (ref 39.0–52.0)
HCT: 26 % — ABNORMAL LOW (ref 39.0–52.0)
Hemoglobin: 8.8 g/dL — ABNORMAL LOW (ref 13.0–17.0)
Hemoglobin: 8.2 g/dL — ABNORMAL LOW (ref 13.0–17.0)
Hemoglobin: 8.2 g/dL — ABNORMAL LOW (ref 13.0–17.0)
Hemoglobin: 8.8 g/dL — ABNORMAL LOW (ref 13.0–17.0)
Hemoglobin: 8.8 g/dL — ABNORMAL LOW (ref 13.0–17.0)
Hemoglobin: 8.8 g/dL — ABNORMAL LOW (ref 13.0–17.0)
Potassium: 4.4 mmol/L (ref 3.5–5.1)
Potassium: 4.5 mmol/L (ref 3.5–5.1)
Potassium: 6.1 mmol/L — ABNORMAL HIGH (ref 3.5–5.1)
Potassium: 6.3 mmol/L (ref 3.5–5.1)
Potassium: 6.5 mmol/L (ref 3.5–5.1)
Potassium: 5 mmol/L (ref 3.5–5.1)
Sodium: 137 mmol/L (ref 135–145)
Sodium: 135 mmol/L (ref 135–145)
Sodium: 135 mmol/L (ref 135–145)
Sodium: 138 mmol/L (ref 135–145)
Sodium: 134 mmol/L — ABNORMAL LOW (ref 135–145)
Sodium: 138 mmol/L (ref 135–145)
TCO2: 25 mmol/L (ref 22–32)
TCO2: 21 mmol/L — ABNORMAL LOW (ref 22–32)
TCO2: 24 mmol/L (ref 22–32)
TCO2: 25 mmol/L (ref 22–32)
TCO2: 24 mmol/L (ref 22–32)
TCO2: 22 mmol/L (ref 22–32)

## 2023-03-16 LAB — BASIC METABOLIC PANEL
Anion gap: 6 (ref 5–15)
Anion gap: 7 (ref 5–15)
Anion gap: 7 (ref 5–15)
BUN: 10 mg/dL (ref 8–23)
BUN: 12 mg/dL (ref 8–23)
BUN: 13 mg/dL (ref 8–23)
CO2: 20 mmol/L — ABNORMAL LOW (ref 22–32)
CO2: 22 mmol/L (ref 22–32)
CO2: 22 mmol/L (ref 22–32)
Calcium: 8.3 mg/dL — ABNORMAL LOW (ref 8.9–10.3)
Calcium: 8.5 mg/dL — ABNORMAL LOW (ref 8.9–10.3)
Calcium: 8.5 mg/dL — ABNORMAL LOW (ref 8.9–10.3)
Chloride: 103 mmol/L (ref 98–111)
Chloride: 105 mmol/L (ref 98–111)
Chloride: 108 mmol/L (ref 98–111)
Creatinine, Ser: 0.58 mg/dL — ABNORMAL LOW (ref 0.61–1.24)
Creatinine, Ser: 0.6 mg/dL — ABNORMAL LOW (ref 0.61–1.24)
Creatinine, Ser: 0.81 mg/dL (ref 0.61–1.24)
GFR, Estimated: >60 mL/min (ref >60)
GFR, Estimated: >60 mL/min (ref >60)
GFR, Estimated: >60 mL/min (ref >60)
Glucose, Bld: 100 mg/dL — ABNORMAL HIGH (ref 70–99)
Glucose, Bld: 168 mg/dL — ABNORMAL HIGH (ref 70–99)
Glucose, Bld: 96 mg/dL (ref 70–99)
Potassium: 4.2 mmol/L (ref 3.5–5.1)
Potassium: 4.3 mmol/L (ref 3.5–5.1)
Potassium: 4.3 mmol/L (ref 3.5–5.1)
Sodium: 130 mmol/L — ABNORMAL LOW (ref 135–145)
Sodium: 133 mmol/L — ABNORMAL LOW (ref 135–145)
Sodium: 137 mmol/L (ref 135–145)

## 2023-03-16 LAB — CBC
HCT: 24.1 % — ABNORMAL LOW (ref 39.0–52.0)
HCT: 24.4 % — ABNORMAL LOW (ref 39.0–52.0)
HCT: 27.2 % — ABNORMAL LOW (ref 39.0–52.0)
HCT: 27.4 % — ABNORMAL LOW (ref 39.0–52.0)
Hemoglobin: 7.9 g/dL — ABNORMAL LOW (ref 13.0–17.0)
Hemoglobin: 8 g/dL — ABNORMAL LOW (ref 13.0–17.0)
Hemoglobin: 8.9 g/dL — ABNORMAL LOW (ref 13.0–17.0)
Hemoglobin: 8.9 g/dL — ABNORMAL LOW (ref 13.0–17.0)
MCH: 28.2 pg (ref 26.0–34.0)
MCH: 28.3 pg (ref 26.0–34.0)
MCH: 28.4 pg (ref 26.0–34.0)
MCH: 28.5 pg (ref 26.0–34.0)
MCHC: 32.5 g/dL (ref 30.0–36.0)
MCHC: 32.7 g/dL (ref 30.0–36.0)
MCHC: 32.8 g/dL (ref 30.0–36.0)
MCHC: 32.8 g/dL (ref 30.0–36.0)
MCV: 86.4 fL (ref 80.0–100.0)
MCV: 86.7 fL (ref 80.0–100.0)
MCV: 86.8 fL (ref 80.0–100.0)
MCV: 86.9 fL (ref 80.0–100.0)
Platelets: 107 10*3/uL — ABNORMAL LOW (ref 150–400)
Platelets: 135 10*3/uL — ABNORMAL LOW (ref 150–400)
Platelets: 139 10*3/uL — ABNORMAL LOW (ref 150–400)
Platelets: 91 10*3/uL — ABNORMAL LOW (ref 150–400)
RBC: 2.79 MIL/uL — ABNORMAL LOW (ref 4.22–5.81)
RBC: 2.81 MIL/uL — ABNORMAL LOW (ref 4.22–5.81)
RBC: 3.13 MIL/uL — ABNORMAL LOW (ref 4.22–5.81)
RBC: 3.16 MIL/uL — ABNORMAL LOW (ref 4.22–5.81)
RDW: 15.9 % — ABNORMAL HIGH (ref 11.5–15.5)
RDW: 15.9 % — ABNORMAL HIGH (ref 11.5–15.5)
RDW: 18 % — ABNORMAL HIGH (ref 11.5–15.5)
RDW: 18.2 % — ABNORMAL HIGH (ref 11.5–15.5)
WBC: 20.1 10*3/uL — ABNORMAL HIGH (ref 4.0–10.5)
WBC: 4.8 10*3/uL (ref 4.0–10.5)
WBC: 5.2 10*3/uL (ref 4.0–10.5)
WBC: 9.4 10*3/uL (ref 4.0–10.5)
nRBC: 0 % (ref 0.0–0.2)
nRBC: 0 % (ref 0.0–0.2)
nRBC: 0 % (ref 0.0–0.2)
nRBC: 0 % (ref 0.0–0.2)

## 2023-03-16 LAB — CULTURE, BLOOD (ROUTINE X 2)
Culture: NO GROWTH
Culture: NO GROWTH

## 2023-03-16 LAB — PREPARE RBC (CROSSMATCH)

## 2023-03-16 LAB — ECHO INTRAOPERATIVE TEE
AR max vel: 2.12 cm2
AV Area VTI: 1.83 cm2
AV Area mean vel: 1.97 cm2
AV Mean grad: 15 mm[Hg]
AV Peak grad: 26.5 mm[Hg]
Ao pk vel: 2.58 m/s
Area-P 1/2: 4.15 cm2
Calc EF: 61.5 %
Height: 71 in
MV VTI: 2.86 cm2
P 1/2 time: 230 ms
Single Plane A2C EF: 63.4 %
Single Plane A4C EF: 60.6 %
Weight: 2699.2 [oz_av]

## 2023-03-16 LAB — POCT I-STAT EG7
Acid-base deficit: 4 mmol/L — ABNORMAL HIGH (ref 0.0–2.0)
Bicarbonate: 21.5 mmol/L (ref 20.0–28.0)
Calcium, Ion: 1.09 mmol/L — ABNORMAL LOW (ref 1.15–1.40)
HCT: 22 % — ABNORMAL LOW (ref 39.0–52.0)
Hemoglobin: 7.5 g/dL — ABNORMAL LOW (ref 13.0–17.0)
O2 Saturation: 86 %
Potassium: 4.9 mmol/L (ref 3.5–5.1)
Sodium: 136 mmol/L (ref 135–145)
TCO2: 23 mmol/L (ref 22–32)
pCO2, Ven: 39.6 mm[Hg] — ABNORMAL LOW (ref 44–60)
pH, Ven: 7.343 (ref 7.25–7.43)
pO2, Ven: 53 mm[Hg] — ABNORMAL HIGH (ref 32–45)

## 2023-03-16 LAB — HEMOGLOBIN AND HEMATOCRIT, BLOOD
HCT: 24.7 % — ABNORMAL LOW (ref 39.0–52.0)
Hemoglobin: 8.1 g/dL — ABNORMAL LOW (ref 13.0–17.0)

## 2023-03-16 LAB — GLUCOSE, CAPILLARY
Glucose-Capillary: 126 mg/dL — ABNORMAL HIGH (ref 70–99)
Glucose-Capillary: 154 mg/dL — ABNORMAL HIGH (ref 70–99)
Glucose-Capillary: 168 mg/dL — ABNORMAL HIGH (ref 70–99)
Glucose-Capillary: 149 mg/dL — ABNORMAL HIGH (ref 70–99)
Glucose-Capillary: 161 mg/dL — ABNORMAL HIGH (ref 70–99)
Glucose-Capillary: 167 mg/dL — ABNORMAL HIGH (ref 70–99)
Glucose-Capillary: 148 mg/dL — ABNORMAL HIGH (ref 70–99)
Glucose-Capillary: 172 mg/dL — ABNORMAL HIGH (ref 70–99)
Glucose-Capillary: 203 mg/dL — ABNORMAL HIGH (ref 70–99)
Glucose-Capillary: 187 mg/dL — ABNORMAL HIGH (ref 70–99)
Glucose-Capillary: 167 mg/dL — ABNORMAL HIGH (ref 70–99)

## 2023-03-16 LAB — SURGICAL PCR SCREEN
MRSA, PCR: NEGATIVE
Staphylococcus aureus: NEGATIVE

## 2023-03-16 LAB — PLATELET COUNT: Platelets: 147 10*3/uL — ABNORMAL LOW (ref 150–400)

## 2023-03-16 LAB — PROTIME-INR
INR: 1.7 — ABNORMAL HIGH (ref 0.8–1.2)
Prothrombin Time: 20.1 s — ABNORMAL HIGH (ref 11.4–15.2)

## 2023-03-16 LAB — MAGNESIUM: Magnesium: 2.8 mg/dL — ABNORMAL HIGH (ref 1.7–2.4)

## 2023-03-16 LAB — AMMONIA: Ammonia: 36 umol/L — ABNORMAL HIGH (ref 9–35)

## 2023-03-16 LAB — APTT: aPTT: 42 s — ABNORMAL HIGH (ref 24–36)

## 2023-03-16 SURGERY — REPLACEMENT, AORTIC VALVE, OPEN
Anesthesia: General | Site: Chest

## 2023-03-16 MED ORDER — MIDAZOLAM HCL (PF) 10 MG/2ML IJ SOLN
INTRAMUSCULAR | Status: AC
Start: 2023-03-16 — End: ?
  Filled 2023-03-16: qty 2

## 2023-03-16 MED ORDER — METHYLPREDNISOLONE SODIUM SUCC 125 MG IJ SOLR
60.0000 mg | Freq: Once | INTRAMUSCULAR | Status: AC
  Administered 2023-03-16: 60 mg via INTRAVENOUS
  Filled 2023-03-16: qty 2

## 2023-03-16 MED ORDER — SODIUM CHLORIDE 0.9 % IV SOLN: 250.0000 mL | INTRAVENOUS | Status: AC

## 2023-03-16 MED ORDER — PROPOFOL 10 MG/ML IV BOLUS
INTRAVENOUS | Status: DC | PRN
  Administered 2023-03-16 (×3): 50 mg via INTRAVENOUS

## 2023-03-16 MED ORDER — ONDANSETRON HCL 4 MG/2ML IJ SOLN
4.0000 mg | Freq: Four times a day (QID) | INTRAMUSCULAR | Status: DC | PRN
Start: 2023-03-16 — End: 2023-03-26

## 2023-03-16 MED ORDER — ASPIRIN 81 MG PO CHEW: 324.0000 mg | CHEWABLE_TABLET | Freq: Every day | ORAL | Status: DC

## 2023-03-16 MED ORDER — ALBUMIN HUMAN 5 % IV SOLN: INTRAVENOUS | Status: DC | PRN

## 2023-03-16 MED ORDER — SODIUM CHLORIDE 0.9 % IV SOLN: INTRAVENOUS | Status: DC | PRN

## 2023-03-16 MED ORDER — KETAMINE HCL 50 MG/5ML IJ SOSY
PREFILLED_SYRINGE | INTRAMUSCULAR | Status: AC
  Filled 2023-03-16: qty 10

## 2023-03-16 MED ORDER — FENTANYL CITRATE (PF) 250 MCG/5ML IJ SOLN
INTRAMUSCULAR | Status: AC
  Filled 2023-03-16: qty 5

## 2023-03-16 MED ORDER — METOCLOPRAMIDE HCL 5 MG/ML IJ SOLN
10.0000 mg | Freq: Four times a day (QID) | INTRAMUSCULAR | Status: AC
  Administered 2023-03-16 – 2023-03-17 (×5): 10 mg via INTRAVENOUS
  Filled 2023-03-16 (×5): qty 2

## 2023-03-16 MED ORDER — CHLORHEXIDINE GLUCONATE 0.12 % MT SOLN
15.0000 mL | OROMUCOSAL | Status: AC
  Administered 2023-03-16: 15 mL via OROMUCOSAL
  Filled 2023-03-16: qty 15

## 2023-03-16 MED ORDER — FENTANYL CITRATE (PF) 250 MCG/5ML IJ SOLN
INTRAMUSCULAR | Status: DC | PRN
  Administered 2023-03-16: 100 ug via INTRAVENOUS
  Administered 2023-03-16: 150 ug via INTRAVENOUS
  Administered 2023-03-16: 100 ug via INTRAVENOUS
  Administered 2023-03-16: 250 ug via INTRAVENOUS
  Administered 2023-03-16: 150 ug via INTRAVENOUS

## 2023-03-16 MED ORDER — POTASSIUM CHLORIDE 10 MEQ/50ML IV SOLN: 10.0000 meq | INTRAVENOUS | Status: AC

## 2023-03-16 MED ORDER — MUPIROCIN 2 % EX OINT
1.0000 | TOPICAL_OINTMENT | Freq: Two times a day (BID) | CUTANEOUS | Status: AC
Start: 2023-03-16 — End: 2023-03-21
  Administered 2023-03-16 – 2023-03-20 (×9): 1 via NASAL
  Filled 2023-03-16 (×2): qty 22

## 2023-03-16 MED ORDER — MIDAZOLAM HCL 2 MG/2ML IJ SOLN
INTRAMUSCULAR | Status: AC
  Filled 2023-03-16: qty 2

## 2023-03-16 MED ORDER — DIPHENHYDRAMINE HCL 50 MG/ML IJ SOLN
INTRAMUSCULAR | Status: DC | PRN
  Administered 2023-03-16: 12.5 mg via INTRAVENOUS

## 2023-03-16 MED ORDER — LACTATED RINGERS IV SOLN: INTRAVENOUS | Status: DC | PRN

## 2023-03-16 MED ORDER — METOPROLOL TARTRATE 12.5 MG HALF TABLET: 12.5000 mg | ORAL_TABLET | Freq: Two times a day (BID) | ORAL | Status: DC

## 2023-03-16 MED ORDER — PROPOFOL 500 MG/50ML IV EMUL
INTRAVENOUS | Status: DC | PRN
  Administered 2023-03-16: 50 ug/kg/min via INTRAVENOUS

## 2023-03-16 MED ORDER — VANCOMYCIN HCL IN DEXTROSE 1-5 GM/200ML-% IV SOLN
1000.0000 mg | Freq: Once | INTRAVENOUS | Status: AC
  Administered 2023-03-16: 1000 mg via INTRAVENOUS
  Filled 2023-03-16: qty 200

## 2023-03-16 MED ORDER — MAGNESIUM SULFATE 4 GM/100ML IV SOLN
4.0000 g | Freq: Once | INTRAVENOUS | Status: AC
  Administered 2023-03-16: 4 g via INTRAVENOUS
  Filled 2023-03-16: qty 100

## 2023-03-16 MED ORDER — PANTOPRAZOLE SODIUM 40 MG IV SOLR
40.0000 mg | Freq: Every day | INTRAVENOUS | Status: AC
  Administered 2023-03-16 – 2023-03-17 (×2): 40 mg via INTRAVENOUS
  Filled 2023-03-16 (×2): qty 10

## 2023-03-16 MED ORDER — PROPOFOL 10 MG/ML IV BOLUS
INTRAVENOUS | Status: AC
  Filled 2023-03-16: qty 20

## 2023-03-16 MED ORDER — SODIUM CHLORIDE 0.9% FLUSH: 10.0000 mL | INTRAVENOUS | Status: DC | PRN

## 2023-03-16 MED ORDER — ROCURONIUM BROMIDE 10 MG/ML (PF) SYRINGE
PREFILLED_SYRINGE | INTRAVENOUS | Status: AC
Start: 2023-03-16 — End: ?
  Filled 2023-03-16: qty 10

## 2023-03-16 MED ORDER — MORPHINE SULFATE (PF) 2 MG/ML IV SOLN
1.0000 mg | INTRAVENOUS | Status: DC | PRN
  Administered 2023-03-16: 1 mg via INTRAVENOUS
  Administered 2023-03-16 – 2023-03-17 (×5): 2 mg via INTRAVENOUS
  Administered 2023-03-17: 4 mg via INTRAVENOUS
  Administered 2023-03-17 – 2023-03-19 (×7): 2 mg via INTRAVENOUS
  Administered 2023-03-20 (×2): 4 mg via INTRAVENOUS
  Administered 2023-03-20: 1 mg via INTRAVENOUS
  Administered 2023-03-20: 2 mg via INTRAVENOUS
  Administered 2023-03-21 – 2023-03-22 (×7): 4 mg via INTRAVENOUS
  Filled 2023-03-16: qty 1
  Filled 2023-03-16: qty 2
  Filled 2023-03-16: qty 1
  Filled 2023-03-16: qty 2
  Filled 2023-03-16 (×2): qty 1
  Filled 2023-03-16 (×3): qty 2
  Filled 2023-03-16 (×4): qty 1
  Filled 2023-03-16 (×3): qty 2
  Filled 2023-03-16: qty 1
  Filled 2023-03-16: qty 2
  Filled 2023-03-16 (×3): qty 1
  Filled 2023-03-16: qty 2
  Filled 2023-03-16 (×3): qty 1

## 2023-03-16 MED ORDER — OXYCODONE HCL 5 MG PO TABS: 5.0000 mg | ORAL_TABLET | ORAL | Status: DC | PRN

## 2023-03-16 MED ORDER — ALBUMIN HUMAN 5 % IV SOLN
INTRAVENOUS | Status: AC
  Administered 2023-03-16: 12.5 g via INTRAVENOUS
  Filled 2023-03-16: qty 250

## 2023-03-16 MED ORDER — SODIUM CHLORIDE (PF) 0.9 % IJ SOLN: OROMUCOSAL | Status: DC | PRN

## 2023-03-16 MED ORDER — NOREPINEPHRINE 4 MG/250ML-% IV SOLN
0.0000 ug/min | INTRAVENOUS | Status: DC
  Administered 2023-03-16: 3 ug/min via INTRAVENOUS
  Filled 2023-03-16: qty 250

## 2023-03-16 MED ORDER — SODIUM CHLORIDE 0.9% FLUSH
10.0000 mL | Freq: Two times a day (BID) | INTRAVENOUS | Status: DC
Start: 2023-03-16 — End: 2023-03-22
  Administered 2023-03-16 – 2023-03-21 (×11): 10 mL

## 2023-03-16 MED ORDER — SUCCINYLCHOLINE CHLORIDE 200 MG/10ML IV SOSY
PREFILLED_SYRINGE | INTRAVENOUS | Status: AC
  Filled 2023-03-16: qty 10

## 2023-03-16 MED ORDER — PANTOPRAZOLE SODIUM 40 MG PO TBEC
40.0000 mg | DELAYED_RELEASE_TABLET | Freq: Every day | ORAL | Status: DC
  Administered 2023-03-18 – 2023-03-26 (×9): 40 mg via ORAL
  Filled 2023-03-16 (×9): qty 1

## 2023-03-16 MED ORDER — METOPROLOL TARTRATE 25 MG/10 ML ORAL SUSPENSION: 12.5000 mg | Freq: Two times a day (BID) | ORAL | Status: DC

## 2023-03-16 MED ORDER — HEMOSTATIC AGENTS (NO CHARGE) OPTIME
TOPICAL | Status: DC | PRN
  Administered 2023-03-16: 1 via TOPICAL

## 2023-03-16 MED ORDER — ONDANSETRON HCL 4 MG/2ML IJ SOLN
INTRAMUSCULAR | Status: AC
Start: 2023-03-16 — End: ?
  Filled 2023-03-16: qty 2

## 2023-03-16 MED ORDER — ACETAMINOPHEN 160 MG/5ML PO SOLN: 650.0000 mg | Freq: Once | ORAL | Status: DC

## 2023-03-16 MED ORDER — ONDANSETRON HCL 4 MG/2ML IJ SOLN
INTRAMUSCULAR | Status: DC | PRN
  Administered 2023-03-16: 4 mg via INTRAVENOUS

## 2023-03-16 MED ORDER — ACETAMINOPHEN 650 MG RE SUPP
650.0000 mg | Freq: Once | RECTAL | Status: AC
  Administered 2023-03-16: 650 mg via RECTAL

## 2023-03-16 MED ORDER — ROCURONIUM BROMIDE 100 MG/10ML IV SOLN
INTRAVENOUS | Status: DC | PRN
  Administered 2023-03-16: 60 mg via INTRAVENOUS
  Administered 2023-03-16 (×2): 40 mg via INTRAVENOUS
  Administered 2023-03-16: 100 mg via INTRAVENOUS

## 2023-03-16 MED ORDER — PROPOFOL 10 MG/ML IV BOLUS
INTRAVENOUS | Status: AC
Start: 2023-03-16 — End: ?
  Filled 2023-03-16: qty 20

## 2023-03-16 MED ORDER — CHLORHEXIDINE GLUCONATE CLOTH 2 % EX PADS
6.0000 | MEDICATED_PAD | Freq: Every day | CUTANEOUS | Status: DC
  Administered 2023-03-16 – 2023-03-26 (×9): 6 via TOPICAL

## 2023-03-16 MED ORDER — DOCUSATE SODIUM 100 MG PO CAPS
200.0000 mg | ORAL_CAPSULE | Freq: Every day | ORAL | Status: DC
  Administered 2023-03-17 – 2023-03-18 (×2): 200 mg via ORAL
  Filled 2023-03-16 (×2): qty 2

## 2023-03-16 MED ORDER — EPINEPHRINE HCL 5 MG/250ML IV SOLN IN NS: 0.5000 ug/min | INTRAVENOUS | Status: DC

## 2023-03-16 MED ORDER — SODIUM CHLORIDE 0.45 % IV SOLN: INTRAVENOUS | Status: AC | PRN

## 2023-03-16 MED ORDER — LACTATED RINGERS IV SOLN: INTRAVENOUS | Status: AC

## 2023-03-16 MED ORDER — MIDAZOLAM HCL 2 MG/2ML IJ SOLN: 2.0000 mg | INTRAMUSCULAR | Status: DC | PRN

## 2023-03-16 MED ORDER — ETOMIDATE 2 MG/ML IV SOLN
INTRAVENOUS | Status: AC
  Filled 2023-03-16: qty 20

## 2023-03-16 MED ORDER — FAMOTIDINE IN NACL 20-0.9 MG/50ML-% IV SOLN
20.0000 mg | Freq: Once | INTRAVENOUS | Status: AC
  Administered 2023-03-16: 20 mg via INTRAVENOUS
  Filled 2023-03-16: qty 50

## 2023-03-16 MED ORDER — CALCIUM CHLORIDE 10 % IV SOLN
1.0000 g | INTRAVENOUS | Status: AC
Start: 2023-03-16 — End: 2023-03-16
  Administered 2023-03-16: 1 g via INTRAVENOUS

## 2023-03-16 MED ORDER — BISACODYL 5 MG PO TBEC
10.0000 mg | DELAYED_RELEASE_TABLET | Freq: Every day | ORAL | Status: DC
  Administered 2023-03-17 – 2023-03-22 (×6): 10 mg via ORAL
  Filled 2023-03-16 (×9): qty 2

## 2023-03-16 MED ORDER — ALBUMIN HUMAN 5 % IV SOLN
12.5000 g | Freq: Once | INTRAVENOUS | Status: AC
  Administered 2023-03-16: 12.5 g via INTRAVENOUS
  Filled 2023-03-16: qty 250

## 2023-03-16 MED ORDER — ACETAMINOPHEN 160 MG/5ML PO SOLN: 1000.0000 mg | Freq: Four times a day (QID) | ORAL | Status: AC

## 2023-03-16 MED ORDER — MIDAZOLAM HCL (PF) 5 MG/ML IJ SOLN
INTRAMUSCULAR | Status: DC | PRN
  Administered 2023-03-16: 1 mg via INTRAVENOUS
  Administered 2023-03-16 (×2): 2 mg via INTRAVENOUS
  Administered 2023-03-16: 3 mg via INTRAVENOUS
  Administered 2023-03-16: 2 mg via INTRAVENOUS

## 2023-03-16 MED ORDER — ASPIRIN 325 MG PO TBEC: 325.0000 mg | DELAYED_RELEASE_TABLET | Freq: Every day | ORAL | Status: DC

## 2023-03-16 MED ORDER — ASPIRIN 81 MG PO CHEW
324.0000 mg | CHEWABLE_TABLET | Freq: Once | ORAL | Status: DC
Start: 2023-03-16 — End: 2023-03-16

## 2023-03-16 MED ORDER — HEPARIN SODIUM (PORCINE) 1000 UNIT/ML IJ SOLN
INTRAMUSCULAR | Status: AC
  Filled 2023-03-16: qty 1

## 2023-03-16 MED ORDER — CHLORHEXIDINE GLUCONATE 0.12 % MT SOLN: 15.0000 mL | Freq: Once | OROMUCOSAL | Status: DC

## 2023-03-16 MED ORDER — SODIUM BICARBONATE 8.4 % IV SOLN
50.0000 meq | Freq: Once | INTRAVENOUS | Status: AC
  Administered 2023-03-16: 50 meq via INTRAVENOUS

## 2023-03-16 MED ORDER — ALBUMIN HUMAN 5 % IV SOLN
250.0000 mL | INTRAVENOUS | Status: AC | PRN
  Administered 2023-03-16 (×3): 12.5 g via INTRAVENOUS
  Filled 2023-03-16 (×2): qty 250

## 2023-03-16 MED ORDER — INSULIN REGULAR(HUMAN) IN NACL 100-0.9 UT/100ML-% IV SOLN: INTRAVENOUS | Status: DC

## 2023-03-16 MED ORDER — METOPROLOL TARTRATE 5 MG/5ML IV SOLN
2.5000 mg | INTRAVENOUS | Status: DC | PRN
  Filled 2023-03-16: qty 5

## 2023-03-16 MED ORDER — PHENYLEPHRINE 80 MCG/ML (10ML) SYRINGE FOR IV PUSH (FOR BLOOD PRESSURE SUPPORT)
PREFILLED_SYRINGE | INTRAVENOUS | Status: DC | PRN
  Administered 2023-03-16: 80 ug via INTRAVENOUS
  Administered 2023-03-16: 160 ug via INTRAVENOUS
  Administered 2023-03-16: 80 ug via INTRAVENOUS
  Administered 2023-03-16: 160 ug via INTRAVENOUS
  Administered 2023-03-16 (×2): 80 ug via INTRAVENOUS

## 2023-03-16 MED ORDER — CEFAZOLIN SODIUM-DEXTROSE 2-4 GM/100ML-% IV SOLN
2.0000 g | Freq: Three times a day (TID) | INTRAVENOUS | Status: AC
Start: 2023-03-16 — End: 2023-03-18
  Administered 2023-03-16 – 2023-03-18 (×6): 2 g via INTRAVENOUS
  Filled 2023-03-16 (×6): qty 100

## 2023-03-16 MED ORDER — HEPARIN SODIUM (PORCINE) 1000 UNIT/ML IJ SOLN
INTRAMUSCULAR | Status: DC | PRN
  Administered 2023-03-16: 27000 [IU] via INTRAVENOUS

## 2023-03-16 MED ORDER — LACTATED RINGERS IV SOLN
INTRAVENOUS | Status: AC
Start: 2023-03-16 — End: 2023-03-17
  Administered 2023-03-16: 20 mL/h via INTRAVENOUS

## 2023-03-16 MED ORDER — ACETAMINOPHEN 500 MG PO TABS
1000.0000 mg | ORAL_TABLET | Freq: Four times a day (QID) | ORAL | Status: AC
  Administered 2023-03-17 – 2023-03-21 (×18): 1000 mg via ORAL
  Filled 2023-03-16 (×19): qty 2

## 2023-03-16 MED ORDER — ASPIRIN 300 MG RE SUPP
300.0000 mg | Freq: Once | RECTAL | Status: AC
  Administered 2023-03-16: 300 mg via RECTAL
  Filled 2023-03-16: qty 1

## 2023-03-16 MED ORDER — PROTAMINE SULFATE 10 MG/ML IV SOLN
INTRAVENOUS | Status: AC
  Filled 2023-03-16: qty 25

## 2023-03-16 MED ORDER — TRAMADOL HCL 50 MG PO TABS
50.0000 mg | ORAL_TABLET | ORAL | Status: DC | PRN
  Administered 2023-03-17 – 2023-03-24 (×19): 100 mg via ORAL
  Administered 2023-03-25: 50 mg via ORAL
  Administered 2023-03-25: 100 mg via ORAL
  Administered 2023-03-25: 50 mg via ORAL
  Administered 2023-03-25 (×2): 100 mg via ORAL
  Administered 2023-03-26: 50 mg via ORAL
  Administered 2023-03-26 (×2): 100 mg via ORAL
  Filled 2023-03-16 (×5): qty 2
  Filled 2023-03-16: qty 1
  Filled 2023-03-16 (×14): qty 2
  Filled 2023-03-16: qty 1
  Filled 2023-03-16 (×6): qty 2

## 2023-03-16 MED ORDER — DEXAMETHASONE SODIUM PHOSPHATE 10 MG/ML IJ SOLN
INTRAMUSCULAR | Status: DC | PRN
  Administered 2023-03-16: 10 mg via INTRAVENOUS

## 2023-03-16 MED ORDER — ORAL CARE MOUTH RINSE: 15.0000 mL | Freq: Once | OROMUCOSAL | Status: DC

## 2023-03-16 MED ORDER — SODIUM CHLORIDE 0.9% FLUSH: 3.0000 mL | INTRAVENOUS | Status: DC | PRN

## 2023-03-16 MED ORDER — BISACODYL 10 MG RE SUPP
10.0000 mg | Freq: Every day | RECTAL | Status: DC
  Filled 2023-03-16 (×2): qty 1

## 2023-03-16 MED ORDER — SODIUM CHLORIDE 0.9 % IV SOLN
INTRAVENOUS | Status: AC
  Administered 2023-03-16: 10 mL/h via INTRAVENOUS

## 2023-03-16 MED ORDER — PROTAMINE SULFATE 10 MG/ML IV SOLN
INTRAVENOUS | Status: DC | PRN
  Administered 2023-03-16: 270 mg via INTRAVENOUS

## 2023-03-16 MED ORDER — DEXTROSE 50 % IV SOLN: 0.0000 mL | INTRAVENOUS | Status: DC | PRN

## 2023-03-16 MED ORDER — FENTANYL CITRATE PF 50 MCG/ML IJ SOSY
PREFILLED_SYRINGE | INTRAMUSCULAR | Status: AC
  Filled 2023-03-16: qty 2

## 2023-03-16 MED ORDER — ROCURONIUM BROMIDE 10 MG/ML (PF) SYRINGE
PREFILLED_SYRINGE | INTRAVENOUS | Status: AC
  Filled 2023-03-16: qty 10

## 2023-03-16 MED ORDER — SODIUM CHLORIDE 0.9% FLUSH
3.0000 mL | Freq: Two times a day (BID) | INTRAVENOUS | Status: DC
  Administered 2023-03-17 – 2023-03-21 (×9): 3 mL via INTRAVENOUS

## 2023-03-16 MED ORDER — DEXMEDETOMIDINE HCL IN NACL 400 MCG/100ML IV SOLN
0.0000 ug/kg/h | INTRAVENOUS | Status: DC
  Administered 2023-03-16: 0.2 ug/kg/h via INTRAVENOUS
  Filled 2023-03-16: qty 100

## 2023-03-16 MED ORDER — LIDOCAINE 2% (20 MG/ML) 5 ML SYRINGE
INTRAMUSCULAR | Status: AC
Start: 2023-03-16 — End: ?
  Filled 2023-03-16: qty 5

## 2023-03-16 MED ORDER — 0.9 % SODIUM CHLORIDE (POUR BTL) OPTIME
TOPICAL | Status: DC | PRN
  Administered 2023-03-16: 6000 mL

## 2023-03-16 SURGICAL SUPPLY — 106 items
ADAPTER CARDIO PERF ANTE/RETRO (ADAPTER) ×2 IMPLANT
APPLICATOR COTTON TIP 6 STRL (MISCELLANEOUS) IMPLANT
APPLICATOR COTTON TIP 6IN STRL (MISCELLANEOUS) IMPLANT
BAG DECANTER FOR FLEXI CONT (MISCELLANEOUS) ×2 IMPLANT
BLADE CLIPPER SURG (BLADE) ×2 IMPLANT
BLADE STERNUM SYSTEM 6 (BLADE) ×2 IMPLANT
BLADE SURG 15 STRL LF DISP TIS (BLADE) ×2 IMPLANT
BLOOD HAEMOCONCENTR 700 MIDI (MISCELLANEOUS) IMPLANT
CANISTER SUCT 3000ML PPV (MISCELLANEOUS) ×2 IMPLANT
CANN PRFSN 3/8XRT ANG TPR 14 (MISCELLANEOUS) ×2 IMPLANT
CANNULA ARTERIAL NVNT 3/8 22FR (MISCELLANEOUS) IMPLANT
CANNULA EZ GLIDE AORTIC 21FR (CANNULA) ×2 IMPLANT
CANNULA GUNDRY RCSP 15FR (MISCELLANEOUS) ×2 IMPLANT
CANNULA PRFSN 3/8XRT ANG TPR14 (MISCELLANEOUS) IMPLANT
CANNULA SUMP PERICARDIAL (CANNULA) ×2 IMPLANT
CANNULA VRC MALB SNGL STG 36FR (MISCELLANEOUS) IMPLANT
CATH CPB KIT HENDRICKSON (MISCELLANEOUS) ×2 IMPLANT
CATH HEART VENT LEFT (CATHETERS) ×2 IMPLANT
CATH ROBINSON RED A/P 18FR (CATHETERS) ×4 IMPLANT
CATH THORACIC 36FR RT ANG (CATHETERS) ×4 IMPLANT
CLIP FOGARTY SPRING 6M (CLIP) IMPLANT
CONN 1/2X1/2X1/2 BEN (MISCELLANEOUS) ×2 IMPLANT
CONN ST 3/8 X 1/2 (MISCELLANEOUS) ×4 IMPLANT
CONN Y 3/8X3/8X3/8 BEN (MISCELLANEOUS) IMPLANT
CONTAINER PROTECT SURGISLUSH (MISCELLANEOUS) ×4 IMPLANT
DEVICE SUT CK QUICK LOAD MINI (Prosthesis & Implant Heart) IMPLANT
DRAPE SRG 135X102X78XABS (DRAPES) ×2 IMPLANT
DRAPE WARM FLUID 44X44 (DRAPES) ×2 IMPLANT
DRSG COVADERM 4X14 (GAUZE/BANDAGES/DRESSINGS) ×2 IMPLANT
ELECT CAUTERY BLADE 6.4 (BLADE) IMPLANT
ELECT REM PT RETURN 9FT ADLT (ELECTROSURGICAL) ×4 IMPLANT
ELECTRODE REM PT RTRN 9FT ADLT (ELECTROSURGICAL) ×4 IMPLANT
FELT TEFLON 1X6 (MISCELLANEOUS) ×4 IMPLANT
GAUZE 4X4 16PLY ~~LOC~~+RFID DBL (SPONGE) ×2 IMPLANT
GAUZE SPONGE 4X4 12PLY STRL (GAUZE/BANDAGES/DRESSINGS) ×4 IMPLANT
GAUZE SPONGE 4X4 12PLY STRL LF (GAUZE/BANDAGES/DRESSINGS) IMPLANT
GLOVE SS BIOGEL STRL SZ 7.5 (GLOVE) ×2 IMPLANT
GLOVE SURG SIGNA 7.5 PF LTX (GLOVE) ×6 IMPLANT
GOWN STRL REUS W/ TWL LRG LVL3 (GOWN DISPOSABLE) ×8 IMPLANT
GOWN STRL REUS W/ TWL XL LVL3 (GOWN DISPOSABLE) ×2 IMPLANT
HEMOSTAT POWDER SURGIFOAM 1G (HEMOSTASIS) ×6 IMPLANT
HEMOSTAT SURGICEL 2X14 (HEMOSTASIS) ×2 IMPLANT
INSERT FOGARTY XLG (MISCELLANEOUS) IMPLANT
KIT BASIN OR (CUSTOM PROCEDURE TRAY) ×2 IMPLANT
KIT SUCTION CATH 14FR (SUCTIONS) ×4 IMPLANT
KIT SUT CK MINI COMBO 4X17 (Prosthesis & Implant Heart) IMPLANT
KIT TURNOVER KIT B (KITS) ×2 IMPLANT
LINE VENT (MISCELLANEOUS) IMPLANT
LOOP VASCLR EXTRA MAXI WHITE (MISCELLANEOUS) ×2 IMPLANT
LOOPS VASCLR EXTRA MAXI WHITE (MISCELLANEOUS) ×2 IMPLANT
NS IRRIG 1000ML POUR BTL (IV SOLUTION) ×10 IMPLANT
PACK E OPEN HEART (SUTURE) ×2 IMPLANT
PACK OPEN HEART (CUSTOM PROCEDURE TRAY) ×2 IMPLANT
PAD ARMBOARD 7.5X6 YLW CONV (MISCELLANEOUS) ×4 IMPLANT
POSITIONER HEAD DONUT 9IN (MISCELLANEOUS) ×2 IMPLANT
SET MPS 3-ND DEL (MISCELLANEOUS) IMPLANT
SPONGE T-LAP 18X18 ~~LOC~~+RFID (SPONGE) ×8 IMPLANT
SPONGE T-LAP 4X18 ~~LOC~~+RFID (SPONGE) ×2 IMPLANT
SUT BONE WAX W31G (SUTURE) ×2 IMPLANT
SUT EB EXC GRN/WHT 2-0 D/A SH (SUTURE) ×2 IMPLANT
SUT EB EXC GRN/WHT 2-0 V-5 (SUTURE) ×4 IMPLANT
SUT ETHIBON EXCEL 2-0 V-5 (SUTURE) IMPLANT
SUT ETHIBOND 2 0 SH (SUTURE) IMPLANT
SUT ETHIBOND 2 0 SH 36X2 (SUTURE) ×4 IMPLANT
SUT ETHIBOND 2 0 V4 (SUTURE) IMPLANT
SUT ETHIBOND 2 0V4 GREEN (SUTURE) IMPLANT
SUT ETHIBOND 4 0 RB 1 (SUTURE) IMPLANT
SUT ETHIBOND V-5 VALVE (SUTURE) IMPLANT
SUT PROLENE 3 0 SH 1 (SUTURE) IMPLANT
SUT PROLENE 3 0 SH DA (SUTURE) ×2 IMPLANT
SUT PROLENE 3 0 SH1 36 (SUTURE) ×2 IMPLANT
SUT PROLENE 4 0 SH DA (SUTURE) IMPLANT
SUT PROLENE 4-0 RB1 .5 CRCL 36 (SUTURE) ×4 IMPLANT
SUT PROLENE 5 0 C 1 36 (SUTURE) ×4 IMPLANT
SUT PROLENE 5 0 CC1 (SUTURE) ×2 IMPLANT
SUT PROLENE 7 0 BV 1 (SUTURE) IMPLANT
SUT SILK 1 MH (SUTURE) ×4 IMPLANT
SUT SILK 1 TIES 10X30 (SUTURE) ×2 IMPLANT
SUT SILK 2 0 SH CR/8 (SUTURE) ×4 IMPLANT
SUT SILK 2-0 18XBRD TIE 12 (SUTURE) ×2 IMPLANT
SUT SILK 3 0 SH CR/8 (SUTURE) ×2 IMPLANT
SUT SILK 4-0 18XBRD TIE 12 (SUTURE) ×2 IMPLANT
SUT STEEL 6MS V (SUTURE) IMPLANT
SUT STEEL SZ 6 DBL 3X14 BALL (SUTURE) IMPLANT
SUT TEM PAC WIRE 2 0 SH (SUTURE) ×8 IMPLANT
SUT VIC AB 1 CTX36XBRD ANBCTR (SUTURE) ×4 IMPLANT
SUT VIC AB 2-0 CTX 27 (SUTURE) IMPLANT
SUT VIC AB 3-0 X1 27 (SUTURE) IMPLANT
SUTURE EB EXC GRN/WHT 2-0 D/A (SUTURE) ×2 IMPLANT
SYR 10ML KIT SKIN ADHESIVE (MISCELLANEOUS) IMPLANT
SYSTEM SAHARA CHEST DRAIN ATS (WOUND CARE) ×2 IMPLANT
TAPE CLOTH SURG 4X10 WHT LF (GAUZE/BANDAGES/DRESSINGS) IMPLANT
TAPE PAPER 2X10 WHT MICROPORE (GAUZE/BANDAGES/DRESSINGS) IMPLANT
TIE VASCULAR EXTRA MAXI WHITE (MISCELLANEOUS) ×2 IMPLANT
TOWEL GREEN STERILE (TOWEL DISPOSABLE) ×2 IMPLANT
TOWEL GREEN STERILE FF (TOWEL DISPOSABLE) ×2 IMPLANT
TRAY FOLEY SLVR 16FR TEMP STAT (SET/KITS/TRAYS/PACK) ×2 IMPLANT
TUBE SUCT INTRACARD DLP 20F (MISCELLANEOUS) IMPLANT
TUBE SUCTION CARDIAC 10FR (CANNULA) IMPLANT
UNDERPAD 30X36 HEAVY ABSORB (UNDERPADS AND DIAPERS) ×2 IMPLANT
VALVE ON-X AORTIC 21MM (Prosthesis & Implant Heart) IMPLANT
VALVE ON-X MITRAL 31/33MM (Prosthesis & Implant Heart) IMPLANT
VASCULAR TIE EXTRA MAXI WHITE (MISCELLANEOUS) ×2
VENT LEFT HEART 12002 (CATHETERS) ×2 IMPLANT
VRC MALLEABLE SINGLE STG 36FR (MISCELLANEOUS) ×2 IMPLANT
WATER STERILE IRR 1000ML POUR (IV SOLUTION) ×4 IMPLANT

## 2023-03-16 NOTE — Progress Notes (Signed)
      [address]       [place] [address]             [phone]      On arrival to ICU Mr. Bouch became profoundly hypotensive requiring high doses of epi (10 mcg/min) and norepi (40 mcg/min). Low filling pressures and no significant bleeding.  Resuscitated with albumin, calcium and bicarb.  Noted to have hives c/w allergic reaction.  Had received Benadryl in OR, was given solumedrol IV as well.  Currently 120/77, DDD paced at 90 PA 23/13 CI 2.5  Down to 6 of epi and 20 of norepi  Zelta Enfield C. Dorris Fetch, MD [facility] [phone]

## 2023-03-16 NOTE — Hospital Course (Addendum)
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 He underwent right wrist ulnar shortening osteotomy by ortho on 8/27. He then underwent right wrist ulnar shortening osteotomy plate removal and bone biopsies with cultures of the distal ulna  on 12/13 due to hardware failure and osteolysis. Cultures came back with no growth at that time. 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 He presented to the ED on 1/09 with one week of worsening shortness of breath, fatigue and increasing lower extremity edema. In the ED he was afebrile and hemodynamically stable on RA. BNP was 211, Troponin I was 27 and platelet count was 146,000. CXR showed increased vascular markings and interstitial markings and chest CTA showed bilateral pleural effusions with diffuse interstitial and interlobular septal thickening consistent with congestive heart failure or pulmonary edema. TTE on 1/10 showed LVEF>75%, severe aortic valve regurgitation with a linear shaped echodensity measuring 2.0cmx0.35cm concerning for endocarditis and mild aortic stenosis. TEE on 1/13 noted an aortic valve vegetation measuring 0.6cmx0.8cm located in the commissure between the NCC/LCC, severe aortic regurgitation, likely leaflet perforation, moderate aortic stenosis, severe mitral regurgitation posteriorly directed, a vegetation of the A3 segment on the mitral valve with a flail A1/A2 segment with likely leaflet perforation. LVEF was estimated at 60-65% and there was mild (grade II) layered plaque involve the descending aorta. Blood cultures were taken and he was started on empiric Cefepime and Vancomycin. Blood cultures were positive for streptococcus, ID was consulted and  recommended discontinuing Cefepime and starting Ceftriaxone.   Dr. Dorris Fetch reviewed the patient's diagnostic studies and determined he would benefit from surgical intervention. He reviewed the treatment options as well as the risks and benefits of surgery. Thomas [name] was agreeable to surgery.  Hospital Course:  Thomas [name] was taken to the operating room on 03/16/2023.  He underwent Aortic valve replacement with a 21 mm Ony-x Mechanical Valve, Mitral Valve Replacement with a 31-33 mm mechanical valve, and drainage of bilateral pleural effusions.  He tolerated the procedure without difficulty and was taken to the SICU in stable condition.  Vital signs and hemodynamics remained stable.  He was weaned from the ventilator and extubated during the late afternoon on the day of surgery.  The monitoring lines were removed on the first postoperative day.  He was started on Coumadin coagulation for the mechanical valves.  He had expected acute blood loss anemia and thrombocytopenia noted on the first postoperative day any bleeding issues.  Chest tubes were removed on postop day 1.  He was mobilized routinely and diuresis was begun.  He was noted to be in a junctional rhythm with a rate in the high 50s on postop day 2.  Beta-blocker was not started.  He was mildly hypertensive so he was started back on his usual ARB and Norvasc.  The INR was monitored daily and Coumadin dosing adjusted accordingly.  He continues to have a junctional rhythm with prolonged PR interval.  EP consult was obtained and they followed patient closely.  However he started to show some recovery of conduction and it was felt pacemaker would not need to be placed. This was placed on 03/21/2023.  He remained on PCN G for strep species.  He will require prolonged course and  PICC line was placed on 03/19/2023.  OR cultures showed no growth and infectious disease recommended 4 weeks of post operative antibiotics.  His pacing wires were removed  without difficulty on 03/22/2023.  He was in NSR with a prolong PR interval.  He was felt stable for transfer to the progressive care unit on 03/22/2023.  He remains on coumadin for mechanical AVR and MVR.  His current INR is 2.9 and he will be discharged home on 4.  He was noted to have a pleural effusion that was moderate in size on the left.  IR was consulted for thoracentesis with successful removal of 1200 cc fluid.  Follow up chest xray showed trace pleural effusions, no pneumothorax.  He does have an expected acute blood loss anemia and values stabilized although notably hemoglobin was borderline at 7.7.  Renal function remained within normal limits.  Incisions are healing well without evidence of infection.  He was tolerating diet and routine activities.  Overall, at the time of discharge she was felt to be quite stable.  All home health arrangements were in place.

## 2023-03-16 NOTE — Brief Op Note (Signed)
03/08/2023 - 03/16/2023  3:52 PM  PATIENT:  Thomas [name]  63 y.o. male  PRE-OPERATIVE DIAGNOSIS:  SEVERE AORTIC INSUFFICIENCY, SEVERE MITRAL REGURGITATION, ENDOCARDITIS  POST-OPERATIVE DIAGNOSIS:  SEVERE AORTIC INSUFFICIENCY, SEVERE MITRAL REGURGITATION, ENDOCARDITIS  PROCEDURE:  Procedure(s):  AORTIC VALVE REPLACEMENT  - 21 mm Ony-x Mechanical Valve Prosthesis  MITRAL VALVE (MV) REPLACEMENT  -31-33 mm Ony-x Mechanical Valve Prosthesis  DRAINAGE OF BILATERAL PLEURAL EFFUSIONS  TRANSESOPHAGEAL ECHOCARDIOGRAM (TEE) (N/A)  SURGEON:  Surgeons and Role:    * [name], MD - Primary  PHYSICIAN ASSISTANT: Lowella Dandy PA-C  ASSISTANTS: Kristie Cowman RNFA   ANESTHESIA:   general  EBL:  750 ml  BLOOD ADMINISTERED: 1 unit CC PRBC and -443  CC CELLSAVER  DRAINS:  Mediastinal Chest Drains, Pleural tubes    LOCAL MEDICATIONS USED:  NONE  SPECIMEN:  Source of Specimen:  Mitral Valve Leaflets, Aortic Valve Leaflets, Vegetations from Aortic Valve, Mitral Valve  DISPOSITION OF SPECIMEN:   Microbiology, Pathology  COUNTS:  YES  TOURNIQUET:  * No tourniquets in log *  DICTATION: .Dragon Dictation  PLAN OF CARE: Admit to inpatient   PATIENT DISPOSITION:  ICU - intubated and hemodynamically stable.   Delay start of Pharmacological VTE agent (>24hrs) due to surgical blood loss or risk of bleeding: no

## 2023-03-16 NOTE — Addendum Note (Signed)
Addendum  created 03/16/23 1427 by April Holding, CRNA   Intraprocedure Event edited

## 2023-03-16 NOTE — Consult Note (Signed)
NAME:  Thomas [name], MRN:  [id number], DOB:  24-Mar-1960, LOS: 8 ADMISSION DATE:  03/08/2023, CONSULTATION DATE:  03/16/23  REFERRING MD:  Mammie Russian, CHIEF COMPLAINT:  Respiratory status change    History of Present Illness:  63 yo M PMH etoh use disorder, thrombocytopenia, s/p L nephrectomy, s/p R ulnar shortening 09/2022 was admitted to [facility] 1/9 after presenting w generalized malaise, SOB. Admitted for suspected decomp HF. Had an echo 1/10 which revealed severe AI and a density attached to the AV leaflet. Bcx obtained 1/11 -- resulted w strep infantarius.  ID consulted 1/12 for this, rec rocephin and TEE which occurred 1/13 and revealed severe AI w AV vegetation, Severe MR w MV vegetation possible perf and flail segment + Moderate AS. CVTS was consulted 1/14 in this setting. 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 Abx changed to pen G when bcx resulted pan sensitive   Underwent MV replacement, AV replacement, drainage of bilat pleural effusions 1/17 with CVTS.  Post op had some hives which improved w benadryl and solumedrol. Fairly vasodilated and on epi and NE.  Extubated. Following extubation had a change in resp status with poor cough/gag, decr effort.  PCCM consulted in this setting   Pertinent  Medical History  Etoh use HTN L nephrectomy CHF thrombocytopenia  Significant Hospital Events: Including procedures, antibiotic start and stop dates in addition to other pertinent events   1/9 admit to [facility]  1/10 abnormal echo c/f AV vegetation 1/11 bcx w strep 1/12 ID consult abx to rocephin  1/13 TEE -- AV and MV endocarditis, possible MV perf. Moderate AS.   1/14 CVTS consult. Cards consult for cath -- single vessel dz of proximal and mid LAD 1/15 abx changed to Pen G -- pan sensitive strep 1/17 AV replacement MV replacement pleural effusion drainage. PCCM consulted post extubation  Interim History / Subjective:   POD 0  Extubated  Objective   Blood pressure 116/67, pulse 90, temperature 97.9 F (36.6 C), resp. rate (!) 21, height 5\' 11"  (1.803 m), weight 76.5 kg, SpO2 100%. PAP: (16-63)/(7-29) 24/13 CO:  [5.3 L/min-11 L/min] 9.6 L/min CI:  [2.7 L/min/m2-5.6 L/min/m2] 4.9 L/min/m2  Vent Mode: PCV;BIPAP FiO2 (%):  [40 %-60 %] 60 % Set Rate:  [4 bmp-24 bmp] 24 bmp Vt Set:  [600 mL] 600 mL PEEP:  [5 cmH20] 5 cmH20 Pressure Support:  [10 cmH20] 10 cmH20   Intake/Output Summary (Last 24 hours) at 03/16/2023 1803 Last data filed at 03/16/2023 1800 Gross per 24 hour  Intake 7599.46 ml  Output 1185 ml  Net 6414.46 ml   Filed Weights   03/14/23 0646 03/15/23 0511 03/16/23 0420  Weight: 76.9 kg 77.5 kg 76.5 kg    Examination: General: Ill appearing middle aged M  HENT: BiPAP in place.  Lungs: Some rhonchi in his bilat upper lobes. No stridor, no wheezing.  Cardiovascular: paced. Chest tubes w bloody output  Abdomen: soft flat ndnt  Extremities: RUE forearm cast  Neuro: Lethargic. Inconsistently following commands but when he does he follows BUE BLE. Some slight vertical asterixis.   GU: foley   Resolved Hospital Problem list     Assessment & Plan:   Encephalopathy, post op  Resp insufficiency  Severe AI s/p AVR  Severe mitral regurg s/p MVR  AV and MV Strep infantarius Endocarditis  Vasoplegia   Hives, improved  AoC diastolic HF  EtOH [place] disorder  P -post  op per CVTS -- surgical ppx, insulin gtt, mo -NE, Epi, wean as able  -STAT cxr - STAT gas obtained and reviewed, a bit of metabolic acidosis. Repeat in an hour  -trial BiPAP  -ID following --pen G for his BE -re his variable neuro exam -- think this probably will continue to improve; is POD 0 from GA and PAD for MV post op. With his etoh hx and slight asterixis, will send an ammonia however. If exam not improving, would send for CT H   Best Practice (right click and "Reselect all SmartList Selections" daily)   Diet/type:  NPO DVT prophylaxis other per cvts Pressure ulcer(s): N/A GI prophylaxis: H2B and PPI Lines: Central line, Arterial Line, and yes and it is still needed Foley:  Yes, and it is still needed Code Status:  full code Last date of multidisciplinary goals of care discussion [--]  Labs   CBC: Recent Labs  Lab 03/13/23 1850 03/14/23 0815 03/15/23 2355 03/16/23 0513 03/16/23 0754 03/16/23 1100 03/16/23 1153 03/16/23 1156 03/16/23 1306 03/16/23 1330 03/16/23 1654 03/16/23 1727  WBC 6.2 5.4 5.2 4.8  --   --   --   --  20.1*  --   --   --   HGB 9.2* 8.9* 8.9* 8.9*   < > 8.1*   < > 8.8* 8.0* 8.8* 7.1* 7.1*  HCT 28.1* 27.5* 27.2* 27.4*   < > 24.7*   < > 26.0* 24.4* 26.0* 21.0* 21.0*  MCV 86.5 87.3 86.9 86.7  --   --   --   --  86.8  --   --   --   PLT 140* 128* 135* 139*  --  147*  --   --  107*  --   --   --    < > = values in this interval not displayed.    Basic Metabolic Panel: Recent Labs  Lab 03/10/23 0358 03/11/23 0347 03/12/23 0349 03/13/23 0419 03/13/23 1850 03/14/23 0815 03/15/23 0805 03/15/23 2355 03/16/23 0513 03/16/23 0754 03/16/23 0847 03/16/23 0904 03/16/23 0933 03/16/23 1000 03/16/23 1059 03/16/23 1153 03/16/23 1156 03/16/23 1330 03/16/23 1654 03/16/23 1727  NA 133* 134*   < > 132*  --  133* 132* 130* 133*   < > 135   < > 135 138 134* 137 138 140 141 141  K 3.6 3.9   < > 3.9  --  3.3* 4.1 4.3 4.2   < > 4.5   < > 6.3* 6.1* 6.5* 4.9 5.0 4.1 3.8 3.9  CL 105 105   < > 105  --  106 105 103 105   < > 105  --  103 101 103  --  104  --   --   --   CO2 24 22   < > 20*  --  21* 22 20* 22  --   --   --   --   --   --   --   --   --   --   --   GLUCOSE 108* 103*   < > 100*  --  155* 88 96 100*   < > 103*  --  128* 145* 147*  --  126*  --   --   --   BUN 11 10   < > 11  --  10 10 12 10    < > 10  --  10 12 12   --  11  --   --   --  CREATININE 0.73 0.86   < > 0.70   < > 0.69 0.66 0.58* 0.60*   < > 0.60*  --  0.60* 0.50* 0.60*  --  0.60*  --   --   --   CALCIUM 8.3*  8.4*   < > 8.2*  --  8.2* 8.5* 8.3* 8.5*  --   --   --   --   --   --   --   --   --   --   --   MG 1.8 2.0  --   --   --   --   --   --   --   --   --   --   --   --   --   --   --   --   --   --    < > = values in this interval not displayed.   GFR: Estimated Creatinine Clearance: 102 mL/min (A) (by C-G formula based on SCr of 0.6 mg/dL (L)). Recent Labs  Lab 03/14/23 0815 03/15/23 2355 03/16/23 0513 03/16/23 1306  WBC 5.4 5.2 4.8 20.1*    Liver Function Tests: No results for input(s): "AST", "ALT", "ALKPHOS", "BILITOT", "PROT", "ALBUMIN" in the last 168 hours. No results for input(s): "LIPASE", "AMYLASE" in the last 168 hours. No results for input(s): "AMMONIA" in the last 168 hours.  ABG    Component Value Date/Time   PHART 7.295 (L) 03/16/2023 1727   PCO2ART 46.6 03/16/2023 1727   PO2ART 144 (H) 03/16/2023 1727   HCO3 22.9 03/16/2023 1727   TCO2 24 03/16/2023 1727   ACIDBASEDEF 4.0 (H) 03/16/2023 1727   O2SAT 99 03/16/2023 1727     Coagulation Profile: Recent Labs  Lab 03/15/23 1834 03/16/23 1306  INR 1.2 1.7*    Cardiac Enzymes: No results for input(s): "CKTOTAL", "CKMB", "CKMBINDEX", "TROPONINI" in the last 168 hours.  HbA1C: No results found for: "HGBA1C"  CBG: Recent Labs  Lab 03/16/23 1401 03/16/23 1502 03/16/23 1557 03/16/23 1658 03/16/23 1752  GLUCAP 154* 168* 149* 161* 167*    Review of Systems:   Unable to obtain --encephalopathic   Past Medical History:  He,  has a past medical history of Blood dyscrasia, GERD (gastroesophageal reflux disease), History of kidney stones, Hypertension, and Thrombocytopenia (HCC) (04/12/2016).   Surgical History:   Past Surgical History:  Procedure Laterality Date   BONE BIOPSY Right 02/09/2023   Procedure: BONE BIOPSY;  Surgeon: Samuella Cota, MD;  Location: [facility];  Service: Orthopedics;  Laterality: Right;   CORONARY ANGIOGRAPHY N/A 03/13/2023   Procedure: CORONARY ANGIOGRAPHY;   Surgeon: Marykay Lex, MD;  Location: [facility] INVASIVE CV LAB;  Service: Cardiovascular;  Laterality: N/A;   HARDWARE REMOVAL Right 02/09/2023   Procedure: RIGHT FOREARM HARDWARE REMOVAL;  Surgeon: Samuella Cota, MD;  Location: [facility];  Service: Orthopedics;  Laterality: Right;   IRRIGATION AND DEBRIDEMENT ELBOW Right 02/09/2023   Procedure: RIGHT FOREARM IRRIGATION AND DEBRIDEMENT;  Surgeon: Samuella Cota, MD;  Location: [facility];  Service: Orthopedics;  Laterality: Right;   ORIF ULNAR FRACTURE Right 02/27/2023   Procedure: RIGHT OPEN REDUCTION INTERNAL FIXATION (ORIF) ULNAR FRACTURE;  Surgeon: Samuella Cota, MD;  Location: [facility];  Service: Orthopedics;  Laterality: Right;   renalectomy Left    TRANSESOPHAGEAL ECHOCARDIOGRAM (CATH LAB) N/A 03/12/2023   Procedure: TRANSESOPHAGEAL ECHOCARDIOGRAM;  Surgeon: Sande Rives, MD;  Location: [facility] INVASIVE CV LAB;  Service: Cardiovascular;  Laterality: N/A;   WRIST OSTEOTOMY Right 10/24/2022   Procedure: RIGHT WRIST ULNAR SHORTENING OSTEOTOMY;  Surgeon: Samuella Cota, MD;  Location: [facility];  Service: Orthopedics;  Laterality: Right;  regional block     Social History:   reports that he has never smoked. He has never used smokeless tobacco. He reports current alcohol use. He reports that he does not use drugs.   Family History:  His family history includes Cancer in his father; Healthy in his mother.   Allergies Allergies  Allergen Reactions   Hydrocodone Rash     Home Medications  Prior to Admission medications   Medication Sig Start Date End Date Taking? Authorizing Provider  acetaminophen (TYLENOL) 650 MG CR tablet Take 1,300 mg by mouth every 8 (eight) hours as needed for pain.   Yes [provider]  ALPRAZolam Prudy Feeler) 1 MG tablet Take 1 mg by mouth at bedtime as needed for anxiety.   Yes [provider]  ibuprofen (ADVIL) 800 MG  tablet Take 1 tablet (800 mg total) by mouth 3 (three) times daily. Take with food Patient taking differently: Take 200 mg by mouth every 8 (eight) hours as needed for fever, headache or mild pain (pain score 1-3). Take with food 05/09/19  Yes Avegno, Zachery Dakins, FNP  olmesartan (BENICAR) 40 MG tablet Take 40 mg by mouth daily.   Yes [provider]  omeprazole (PRILOSEC OTC) 20 MG tablet Take 20 mg by mouth daily.   Yes [provider]  oxyCODONE (ROXICODONE) 5 MG immediate release tablet Take 1 tablet (5 mg total) by mouth every 6 (six) hours as needed. Patient not taking: Reported on 03/08/2023 02/26/23   Agarwala, Lajuan Lines, MD  DULoxetine (CYMBALTA) 60 MG capsule Take 60 mg by mouth daily. Patient not taking: Reported on 03/08/2023    [provider]     Critical care time: 40 min     CRITICAL CARE Performed by: Lanier Clam   Total critical care time: 40 minutes  Critical care time was exclusive of separately billable procedures and treating other patients.  Critical care was necessary to treat or prevent imminent or life-threatening deterioration.  Critical care was time spent personally by me on the following activities: development of treatment plan with patient and/or surrogate as well as nursing, discussions with consultants, evaluation of patient's response to treatment, examination of patient, obtaining history from patient or surrogate, ordering and performing treatments and interventions, ordering and review of laboratory studies, ordering and review of radiographic studies, pulse oximetry and re-evaluation of patient's condition.  Tessie Fass MSN, AGACNP-BC [facility] Pulmonary/Critical Care Medicine Amion for pager  03/16/2023, 6:03 PM

## 2023-03-16 NOTE — Transfer of Care (Signed)
Immediate Anesthesia Transfer of Care Note  Patient: Thomas [name] [facility]  Procedure(s) Performed: AORTIC VALVE REPLACEMENT (AVR) USING ON-X PROSTHETIC AORTIC HEART VALVE WITH ANATOMIC SEWING RING SIZE (Chest) MITRAL VALVE (MV) REPLACEMENT USING ON-X PROSTHETIC MITRAL HEART VALVE WITH STANDARD SEWING RING SIZE 31/33MM TRANSESOPHAGEAL ECHOCARDIOGRAM (TEE)  Patient Location: SICU  Anesthesia Type:General  Level of Consciousness: sedated and Patient remains intubated per anesthesia plan  Airway & Oxygen Therapy: Patient remains intubated per anesthesia plan and Patient placed on Ventilator (see vital sign flow sheet for setting)  Post-op Assessment: Report given to RN and Post -op Vital signs reviewed and stable  Post vital signs: Reviewed and stable  Last Vitals:  Vitals Value Taken Time  BP 102/46 03/16/23 1308  Temp 35.4 C 03/16/23 1317  Pulse 88 03/16/23 1317  Resp 16 03/16/23 1317  SpO2 97 % 03/16/23 1317  Vitals shown include unfiled device data.  Last Pain:  Vitals:   03/16/23 0647  TempSrc:   PainSc: 0-No pain      Patients Stated Pain Goal: 4 (03/15/23 0830)  Complications: No notable events documented.

## 2023-03-16 NOTE — Procedures (Signed)
Extubation Procedure Note  Patient Details:   Name: Thomas [name] DOB: 09-10-1960 MRN: [id number]   Airway Documentation:    Vent end date: 03/16/23 Vent end time: 1705   Evaluation  O2 sats: stable throughout Complications: No apparent complications Patient did not tolerate procedure well. Bilateral Breath Sounds: Clear, Diminished   No  NIF-20 VC 1.21  Pt extubated to 4l [place] with RN at bedside. Positive cuff leak noted, Pt not able to talk. When suctioned pt had no gag reflex. Pt would not respond as before the tube was pulled. MD and CCM notified. PT placed on BIPAP per CCM.  Lajuan Lines 03/16/2023, 5:34 PM

## 2023-03-16 NOTE — Anesthesia Procedure Notes (Signed)
Central Venous Catheter Insertion Performed by: [place] Nation, MD, anesthesiologist Start/End1/17/2025 7:05 AM, 03/16/2023 7:15 AM Patient location: Pre-op. Preanesthetic checklist: patient identified, IV checked, site marked, risks and benefits discussed, surgical consent, monitors and equipment checked, pre-op evaluation, timeout performed and anesthesia consent Lidocaine 1% used for infiltration and patient sedated Hand hygiene performed  and maximum sterile barriers used  Catheter size: 8.5 Fr PA cath was placed.MAC introducer Swan type:thermodilution Procedure performed using ultrasound guided technique. Ultrasound Notes:anatomy identified, needle tip was noted to be adjacent to the nerve/plexus identified, no ultrasound evidence of intravascular and/or intraneural injection and image(s) printed for medical record Attempts: 1 Following insertion, line sutured and dressing applied. Post procedure assessment: blood return through all ports, free fluid flow and no air  Patient tolerated the procedure well with no immediate complications.

## 2023-03-16 NOTE — Anesthesia Procedure Notes (Signed)
Procedure Name: Intubation Date/Time: 03/16/2023 6:50 AM  Performed by: April Holding, CRNAPre-anesthesia Checklist: Patient identified, Emergency Drugs available, Suction available and Patient being monitored Patient Re-evaluated:Patient Re-evaluated prior to induction Oxygen Delivery Method: Circle System Utilized Preoxygenation: Pre-oxygenation with 100% oxygen Induction Type: IV induction Ventilation: Mask ventilation without difficulty Laryngoscope Size: Miller and 2 Grade View: Grade II Tube type: Oral Tube size: 8.0 mm Number of attempts: 1 Airway Equipment and Method: Stylet Placement Confirmation: ETT inserted through vocal cords under direct vision, positive ETCO2 and breath sounds checked- equal and bilateral Secured at: 23 cm Tube secured with: Tape Dental Injury: Teeth and Oropharynx as per pre-operative assessment

## 2023-03-16 NOTE — Anesthesia Postprocedure Evaluation (Signed)
Anesthesia Post Note  Patient: Thomas [name] [facility]  Procedure(s) Performed: AORTIC VALVE REPLACEMENT (AVR) USING ON-X PROSTHETIC AORTIC HEART VALVE WITH ANATOMIC SEWING RING SIZE (Chest) MITRAL VALVE (MV) REPLACEMENT USING ON-X PROSTHETIC MITRAL HEART VALVE WITH STANDARD SEWING RING SIZE 31/33MM TRANSESOPHAGEAL ECHOCARDIOGRAM (TEE)     Patient location during evaluation: SICU Anesthesia Type: General Level of consciousness: sedated Pain management: pain level controlled Vital Signs Assessment: post-procedure vital signs reviewed and stable Respiratory status: patient remains intubated per anesthesia plan Cardiovascular status: stable Postop Assessment: no apparent nausea or vomiting Anesthetic complications: no   No notable events documented.  Last Vitals:  Vitals:   03/16/23 0722 03/16/23 0723  BP:    Pulse: 78 77  Resp: 15 13  Temp:    SpO2: 98% 98%    Last Pain:  Vitals:   03/16/23 0647  TempSrc:   PainSc: 0-No pain                 [place] Nation

## 2023-03-16 NOTE — Op Note (Signed)
NAME: Thomas [name], VOLAND MEDICAL RECORD NO: [id number] ACCOUNT NO: [id number] DATE OF BIRTH: 12-02-1960 FACILITY: [facility] LOCATION: [facility] PHYSICIAN: Salvatore Decent. 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  PROCEDURE:   Median sternotomy, extracorporeal circulation,  Mitral valve replacement using 31/33 Onyx mechanical valve, serial number [id number],  Aortic valve replacement using 21 mm Onyx mechanical valve, serial number [id number].  SURGEON:  Salvatore Decent. Dorris Fetch, MD  ASSISTANT:  Lowella Dandy, PA  Experienced assistance was necessary for this case due to surgical complexity.  Erin Barrett assisted with exposure, retraction of delicate tissues, suture management and suctioning during the valve replacements, and closure of left atrium and aorta respectively.  ANESTHESIA:  General.  FINDINGS:  Transesophageal echocardiography showed severe aortic and mitral regurgitation with vegetations, minimal tricuspid regurgitation with no vegetation, preserved left and right ventricular systolic function.  Vegetations involving anterior and posterior leaflets of mitral valve and all three leaflets of the aortic valve.  Good function of both prostheses post bypass.  CLINICAL NOTE: Thomas [name] is a 63 year old gentleman who presented with shortness of breath, fatigue, and worsening lower extremity edema.  He was found to be in congestive heart failure.  Workup revealed severe aortic and mitral valve endocarditis.  Blood cultures grew Streptococcus infantarius.  He was treated with intravenous antibiotics, ultimately settling on IV penicillin G once the bacteria was identified.  Given the severe regurgitation of both the aortic and mitral valves, the patient was advised to  undergo valve replacement with a possibility, albeit small, of repairing the mitral valve.  The indications, risks, benefits, and alternatives were discussed in detail with the patient.  He understood and accepted the risks and agreed to proceed.  We discussed the use of mechanical versus tissue valves.  Given his young age, there is a high likelihood of need for redo surgery or other intervention in a relatively short timeframe with tissue valve in the mitral location.  He understood the need for lifelong anticoagulation with the mechanical valves and wished to have those placed.  OPERATIVE NOTE: Thomas [name] was brought to the operating room on 03/16/2023.  He had induction of general anesthesia and was intubated.  Dr. Charlynn Grimes of anesthesia performed transesophageal echocardiography.  Please see his separately dictated note for full details.  Findings were as noted above.  A Foley catheter was placed.  Intravenous antibiotics were administered.  The chest, abdomen, and legs were prepped and draped in the usual sterile fashion.  A timeout was performed.  A median sternotomy was performed.  Initial hemostasis was obtained.  A full dose of heparin was administered.  A sternal retractor was placed and was gradually opened over time.  The pericardium was opened.  The ascending aorta was inspected.  It was normal caliber with no evidence of atherosclerotic disease.  After confirming adequate anticoagulation with ACT measurement, the aorta was cannulated via concentric 2-0 Ethibond pledgeted pursestring sutures.  A 31-French venous cannula was placed via a pursestring suture in  the superior vena cava.  Cardiopulmonary bypass was initiated.  A 36-French malleable venous cannula was placed via a pursestring suture in the inferior aspect of the right atrium and directed into the inferior vena cava.  Caval tapes were placed but were only tightened during the mitral portion of the procedure.  A foam pad was  placed in  the pericardium to insulate the heart.  A temperature probe was placed in the myocardial septum.  A left ventricular vent was placed via a pursestring suture in the right superior pulmonary vein and directed into the left ventricle.  A retrograde cardioplegia cannula was placed via a pursestring suture in the right atrium and directed into the coronary sinus.  An antegrade cardioplegia cannula was placed via a  pursestring suture in the ascending aorta.  The aorta was cross-clamped.  The left ventricle was emptied via the aortic root vent.  Cardiac arrest was achieved with a combination of cold antegrade and retrograde KBC blood cardioplegia.  Initial 200 mL of cardioplegia was administered antegrade, but there was relatively low root pressure.  The remainder of the 1.2 liter dose was given via the retrograde cannula.  There was septal cooling to 11 degrees Celsius.  Additional cardioplegia was administered at 60 minutes via the retrograde cannula.  The intraatrial groove was dissected out and the left atrium was opened.  The retractor was placed.  The valve was inspected.  There were vegetations on both the anterior and posterior leaflets.  The valve did not appear to be repairable.  The areas of vegetations were removed.  The majority of the anterior leaflet was removed.  Small portions of the anterior leaflet attached to cords were preserved and sewn to the annulus with 2-0 Ethibond pledgeted sutures.  Posteriorly, the majority of the A1 leaflet was removed.  Portions of A2 and A3 were salvaged.  The annulus sized for a 31/33 mm Onyx mitral prosthesis.  2-0 Ethibond pledgetted horizontal mattress sutures were placed circumferentially around the annulus with supra annular pledgets.  The sutures then were passed through the sewing ring of the valve.  The valve was lowered into place and the sutures were secured using the Cor-Knot device.  The leaflets moved freely.  The left ventricular vent was pulled back into  the left atrium prior to placing the valve.  The left atriotomy then was closed in two layers with a running 4-0 Prolene horizontal mattress suture followed by a running 4-0 Prolene simple suture.  De-airing was performed prior to tying the first layer of the sutures.  An aortotomy was performed and extended into the noncoronary sinus of Valsalva.  The aortic valve was inspected.  There were vegetations on the left and noncoronary cusps and a smaller vegetation on the right coronary cusp.  There was a vegetation at the commissure of the left and noncoronary.  This vegetation was sent for culture.  The remainder was sent for pathology.  The valve leaflets were excised.  The annulus was debrided.  There was minimal annular calcification.  When excising the vegetation at the left/noncoronary commissure, there was a small perforation.  This was repaired with the valve sutures.  2-0 Ethibond horizontal mattress sutures were placed circumferentially around the annulus.  Subannular pledgets were utilized.  The annulus sized for a 21 mm valve.  The coronary ostia were both well clear of the annulus and had no evidence of significant atherosclerotic disease.  The sutures were passed through the sewing ring of the valve.  The valve was lowered into place.  The sutures were secured using the Cor-Knot device.  The leaflets moved easily.  The annulus was probed with a fine tip right angle and no gaps were noted.  Rewarming was begun.  The aortotomy then was closed in two layers again with a running 4-0 Prolene horizontal mattress suture.  De-airing was performed and then a running simple suture was performed.  A reanimation dose of cardioplegia was administered via the retrograde cannula and the aortic cross clamp was removed.  The total cross clamp time was 123 minutes.    The patient transiently had ventricular tachycardia, but then that broke spontaneously and he was in heart block with a very slow ventricular escape.   While rewarming was completed, the aortotomy and atriotomy were inspected for hemostasis.  Epicardial pacing wires were placed on the right ventricle and right atrium.  The retrograde cardioplegia cannula and left atrial vent were removed.  There was good hemostasis at both sites.  The patient then was weaned from bypass.  The root vent was left in place on low suction for any additional air.  The patient weaned easily from bypass for a trial prior to removing the inferior vena caval cannula.  He then was placed back on bypass.  The inferior vena caval cannula was removed after repositioning the superior vena cava cannula into the right atrium.  He then  weaned from bypass without difficulty.  He was on low-dose epinephrine and norepinephrine infusions at the time of separation from bypass.  He was also DDD paced at 90 beats per minute. The total bypass time was 158 minutes.    The initial cardiac index was 3 liters per minute per meter squared and the patient remained hemodynamically stable throughout the post bypass period.  Post bypass transesophageal echocardiography showed good function of the prosthetic valves with no paravalvular leaks and preserved left ventricular systolic function.  There was no residual air and the aortic root vent was removed.  Test dose of protamine was administered and was well tolerated.  The superior vena caval cannula was removed.  The aortic cannula was removed.  The remainder of the protamine was administered without incident.  The chest was irrigated with warm saline.  Hemostasis was achieved.  Prior to weaning from bypass, both pleural spaces were opened and moderate left and large right pleural effusions were evacuated.  The pericardium was reapproximated over the ascending aorta with interrupted 3-0 sutures.  The chest was copiously irrigated with saline.  Hemostasis was achieved.  Blake drains were placed into each pleural space and then a 36-French chest tube was placed  into the anterior mediastinum.  All were secured with 0 silk sutures.  The sternum was closed with a combination of single and double-heavy gauge stainless steel wires.  The pectoralis fascia and subcutaneous tissues and skin were closed in a standard fashion.  All sponge, needle, and instrument counts were corrected at the end of the procedure.  The patient was transported from the operating room to the surgical  intensive care unit, intubated and in fair condition.   PUS D: 03/16/2023 4:14:24 pm T: 03/16/2023 7:36:00 pm  JOB: [id number]/ [id number]

## 2023-03-16 NOTE — Progress Notes (Signed)
   03/16/23 1917  BiPAP/CPAP/SIPAP  $ Non-Invasive Ventilator  Non-Invasive Vent Subsequent  BiPAP/CPAP/SIPAP Pt Type Adult  BiPAP/CPAP/SIPAP SERVO  Mask Type Full face mask  Mask Size Large  Set Rate 24 breaths/min  Respiratory Rate 24 breaths/min  IPAP 14 cmH20  EPAP 6 cmH2O  FiO2 (%) 60 %  Minute Ventilation 14.4  Leak 53  Peak Inspiratory Pressure (PIP) 17  Tidal Volume (Vt) 631  Patient Home Equipment No  Press High Alarm 20 cmH2O

## 2023-03-16 NOTE — Progress Notes (Signed)
      [address]       [place] [address]             [phone]      Intubated, starting to wake up  BP (!) 124/57   Pulse 89   Temp (!) 95.5 F (35.3 C)   Resp 17   Ht 5\' 11"  (1.803 m)   Wt 76.5 kg   SpO2 98%   BMI 23.53 kg/m  DDD 90 underlying sinus with long PR vs junctional 70s 17/8 CI 4.5 Norepi 3, epi 2  Intake/Output Summary (Last 24 hours) at 03/16/2023 1623 Last data filed at 03/16/2023 1600 Gross per 24 hour  Intake 7444.68 ml  Output 900 ml  Net 6544.68 ml   Hct 26 K4.1 Rash improved  Looks great currently Wean vent as tolerated  Viviann Spare C. Dorris Fetch, MD [facility] [phone]

## 2023-03-16 NOTE — Discharge Instructions (Addendum)
YOU MAY NOT CONSUME ALCOHOLIC BEVERAGES   Discharge Instructions:  1. You may shower, please wash incisions daily with soap and water and keep dry.  If you wish to cover wounds with dressing you may do so but please keep clean and change daily.  No tub baths or swimming until incisions have completely healed.  If your incisions become red or develop any drainage please call our office at [phone]  2. No Driving until cleared by Dr. Sunday Corn office and you are no longer using narcotic pain medications  3. Monitor your weight daily.. Please use the same scale and weigh at same time... If you gain 5-10 lbs in 48 hours with associated lower extremity swelling, please contact our office at [phone]  4. Fever of 101.5 for at least 24 hours with no source, please contact our office at [phone]  5. Activity- up as tolerated, please walk at least 3 times per day.  Avoid strenuous activity, no lifting, pushing, or pulling with your arms over 8-10 lbs for a minimum of 6 weeks  6. If any questions or concerns arise, please do not hesitate to contact our office at [phone]

## 2023-03-16 NOTE — Interval H&P Note (Signed)
History and Physical Interval Note:  03/16/2023 7:18 AM  Thomas [name]  has presented today for surgery, with the diagnosis of SEVERE AI SEVERE MR ENDOCARDITIS.  The various methods of treatment have been discussed with the patient and family. After consideration of risks, benefits and other options for treatment, the patient has consented to  Procedure(s): AORTIC VALVE REPLACEMENT (AVR) (N/A) MITRAL VALVE (MV) REPLACEMENT (N/A) TRANSESOPHAGEAL ECHOCARDIOGRAM (TEE) (N/A) as a surgical intervention.  The patient's history has been reviewed, patient examined, no change in status, stable for surgery.  I have reviewed the patient's chart and labs.  Questions were answered to the patient's satisfaction.     [name]

## 2023-03-16 NOTE — Anesthesia Procedure Notes (Signed)
Arterial Line Insertion Start/End1/17/2025 6:54 AM, 03/16/2023 7:12 AM Performed by:  Nation, MD, Darryl Nestle, CRNA, CRNA  Patient location: Pre-op. Preanesthetic checklist: patient identified, IV checked, site marked, risks and benefits discussed, surgical consent, monitors and equipment checked, pre-op evaluation and timeout performed Lidocaine 1% used for infiltration Left, radial was placed Catheter size: 20 G Hand hygiene performed  and maximum sterile barriers used  Allen's test indicative of satisfactory collateral circulation Attempts: 2 Procedure performed without using ultrasound guided technique. Ultrasound Notes:anatomy identified, needle tip was noted to be adjacent to the nerve/plexus identified and no ultrasound evidence of intravascular and/or intraneural injection Following insertion, dressing applied. Post procedure complications: second provider assisted and unsuccessful attempts. Patient tolerated the procedure well with no immediate complications.

## 2023-03-16 NOTE — Addendum Note (Signed)
Addendum  created 03/16/23 1427 by Darryl Nestle, CRNA   Intraprocedure Event deleted, Intraprocedure Event edited

## 2023-03-16 NOTE — Progress Notes (Signed)
1700 Patient passed all pre-extubation tests 1705 Patient extubated; patient not able to talk, no gag reflex, and O2 sats dropping to the 80s on 4L [place]  [place]on paged and CCM called for possible re-intubation. RSI kit taken from [facility] and patient manually bagged with providers at bedside. Per CCM, patient placed on BIPAP first to see how he progresses. Another ABG was taken and ammonia lab drawn. Patient care ongoing.

## 2023-03-16 NOTE — TOC Initial Note (Signed)
Transition of Care [facility]) - Initial/Assessment Note    Patient Details  Name: Thomas [name] MRN: [id number] Date of Birth: 17-Jul-1960  [facility]) CM/SW Contact:    Elliot Cousin, RN Phone Number: [phone] 03/16/2023, 3:23 PM  Clinical Narrative:                 CM spoke to pt's SO, and states he was independent pta. Explained CM/CSW will continue to follow for dc needs.   Expected Discharge Plan: IP Rehab Facility Barriers to Discharge: Continued Medical Work up   Patient Goals and CMS Choice     Choice offered to / list presented to : NA      Expected Discharge Plan and Services In-house Referral: NA Discharge Planning Services: CM Consult   Living arrangements for the past 2 months: Single Family Home                   DME Agency: NA                  Prior Living Arrangements/Services Living arrangements for the past 2 months: Single Family Home Lives with:: Significant Other Patient language and need for interpreter reviewed:: Yes        Need for Family Participation in Patient Care: Yes (Comment) Care giver support system in place?: Yes (comment) Current home services: DME Criminal Activity/Legal Involvement Pertinent to Current Situation/Hospitalization: No - Comment as needed  Activities of Daily Living   ADL Screening (condition at time of admission) Independently performs ADLs?: Yes (appropriate for developmental age) Is the patient deaf or have difficulty hearing?: No Does the patient have difficulty seeing, even when wearing glasses/contacts?: No Does the patient have difficulty concentrating, remembering, or making decisions?: No  Permission Sought/Granted Permission sought to share information with : Family Supports Permission granted to share information with : Yes, Verbal Permission Granted  Share Information with NAME: Lyda Jester     Permission granted to share info w Relationship: SO  Permission granted  to share info w Contact Information: [phone]  Emotional Assessment Appearance:: Appears stated age Attitude/Demeanor/Rapport: Engaged Affect (typically observed): Appropriate Orientation: : Oriented to Self, Oriented to Place, Oriented to  Time, Oriented to Situation Alcohol / Substance Use: Not Applicable Psych Involvement: No (comment)  Admission diagnosis:  Acute CHF (HCC) [I50.9] Systolic congestive heart failure, unspecified HF chronicity (HCC) [I50.20] Patient Active Problem List   Diagnosis Date Noted   S/P AVR (aortic valve replacement) 03/16/2023   S/P MVR (mitral valve replacement) 03/16/2023   Acute on chronic diastolic CHF (congestive heart failure) (HCC) 03/11/2023   Acute bacterial endocarditis 03/10/2023   Acute heart failure with preserved ejection fraction (HFpEF) (HCC) 03/10/2023   Acute CHF (HCC) 03/08/2023   Pancytopenia (HCC) 03/08/2023   Alcohol abuse 03/08/2023   Essential hypertension 03/08/2023   Closed fracture of shaft of right ulna with delayed healing 02/27/2023   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   Thrombocytopenia (HCC) 04/12/2016   PCP:  Assunta Found, MD Pharmacy:   [facility] [phone] - [place], [place] - [address] AT [facility] [address] [place] [address] Phone: [phone] Fax: [phone]     Social Drivers of Health (SDOH) Social History: SDOH Screenings   Food Insecurity: No Food Insecurity (03/10/2023)  Housing: Low Risk  (03/10/2023)  Transportation Needs: No Transportation Needs (03/10/2023)  Utilities: Not At Risk (03/10/2023)  Social Connections: Socially  Integrated (03/10/2023)  Tobacco Use: Low Risk  (03/16/2023)   SDOH Interventions:     Readmission Risk Interventions     No data to display

## 2023-03-16 NOTE — Addendum Note (Signed)
Addendum  created 03/16/23 1446 by April Holding, CRNA   Intraprocedure Event deleted, Intraprocedure Event edited

## 2023-03-17 ENCOUNTER — Inpatient Hospital Stay (HOSPITAL_COMMUNITY): Payer: 59

## 2023-03-17 DIAGNOSIS — I5033 Acute on chronic diastolic (congestive) heart failure: Secondary | ICD-10-CM | POA: Diagnosis not present

## 2023-03-17 LAB — POCT I-STAT 7, (LYTES, BLD GAS, ICA,H+H)
Acid-base deficit: 3 mmol/L — ABNORMAL HIGH (ref 0.0–2.0)
Acid-base deficit: 4 mmol/L — ABNORMAL HIGH (ref 0.0–2.0)
Bicarbonate: 21.2 mmol/L (ref 20.0–28.0)
Bicarbonate: 22.3 mmol/L (ref 20.0–28.0)
Calcium, Ion: 1.27 mmol/L (ref 1.15–1.40)
Calcium, Ion: 1.28 mmol/L (ref 1.15–1.40)
HCT: 22 % — ABNORMAL LOW (ref 39.0–52.0)
HCT: 22 % — ABNORMAL LOW (ref 39.0–52.0)
Hemoglobin: 7.5 g/dL — ABNORMAL LOW (ref 13.0–17.0)
Hemoglobin: 7.5 g/dL — ABNORMAL LOW (ref 13.0–17.0)
O2 Saturation: 96 %
O2 Saturation: 97 %
Patient temperature: 36.6
Potassium: 4.4 mmol/L (ref 3.5–5.1)
Potassium: 4.5 mmol/L (ref 3.5–5.1)
Sodium: 141 mmol/L (ref 135–145)
Sodium: 141 mmol/L (ref 135–145)
TCO2: 22 mmol/L (ref 22–32)
TCO2: 24 mmol/L (ref 22–32)
pCO2 arterial: 37.5 mm[Hg] (ref 32–48)
pCO2 arterial: 40.8 mm[Hg] (ref 32–48)
pH, Arterial: 7.344 — ABNORMAL LOW (ref 7.35–7.45)
pH, Arterial: 7.362 (ref 7.35–7.45)
pO2, Arterial: 84 mm[Hg] (ref 83–108)
pO2, Arterial: 92 mm[Hg] (ref 83–108)

## 2023-03-17 LAB — HEMOGLOBIN A1C
Hgb A1c MFr Bld: 5 % (ref 4.8–5.6)
Mean Plasma Glucose: 96.8 mg/dL

## 2023-03-17 LAB — LIPID PANEL
Cholesterol: 50 mg/dL (ref 0–200)
HDL: 13 mg/dL — ABNORMAL LOW (ref >40)
LDL Cholesterol: 29 mg/dL (ref 0–99)
Total CHOL/HDL Ratio: 3.8 {ratio}
Triglycerides: 38 mg/dL (ref <150)
VLDL: 8 mg/dL (ref 0–40)

## 2023-03-17 LAB — CBC
HCT: 23.7 % — ABNORMAL LOW (ref 39.0–52.0)
HCT: 24 % — ABNORMAL LOW (ref 39.0–52.0)
Hemoglobin: 7.6 g/dL — ABNORMAL LOW (ref 13.0–17.0)
Hemoglobin: 7.4 g/dL — ABNORMAL LOW (ref 13.0–17.0)
MCH: 27.8 pg (ref 26.0–34.0)
MCH: 27.4 pg (ref 26.0–34.0)
MCHC: 32.1 g/dL (ref 30.0–36.0)
MCHC: 30.8 g/dL (ref 30.0–36.0)
MCV: 86.8 fL (ref 80.0–100.0)
MCV: 88.9 fL (ref 80.0–100.0)
Platelets: 81 10*3/uL — ABNORMAL LOW (ref 150–400)
Platelets: 109 10*3/uL — ABNORMAL LOW (ref 150–400)
RBC: 2.73 MIL/uL — ABNORMAL LOW (ref 4.22–5.81)
RBC: 2.7 MIL/uL — ABNORMAL LOW (ref 4.22–5.81)
RDW: 18.1 % — ABNORMAL HIGH (ref 11.5–15.5)
RDW: 18.1 % — ABNORMAL HIGH (ref 11.5–15.5)
WBC: 9 10*3/uL (ref 4.0–10.5)
WBC: 14 10*3/uL — ABNORMAL HIGH (ref 4.0–10.5)
nRBC: 0 % (ref 0.0–0.2)
nRBC: 0 % (ref 0.0–0.2)

## 2023-03-17 LAB — GLUCOSE, CAPILLARY
Glucose-Capillary: 106 mg/dL — ABNORMAL HIGH (ref 70–99)
Glucose-Capillary: 111 mg/dL — ABNORMAL HIGH (ref 70–99)
Glucose-Capillary: 125 mg/dL — ABNORMAL HIGH (ref 70–99)
Glucose-Capillary: 133 mg/dL — ABNORMAL HIGH (ref 70–99)
Glucose-Capillary: 137 mg/dL — ABNORMAL HIGH (ref 70–99)
Glucose-Capillary: 138 mg/dL — ABNORMAL HIGH (ref 70–99)
Glucose-Capillary: 140 mg/dL — ABNORMAL HIGH (ref 70–99)
Glucose-Capillary: 142 mg/dL — ABNORMAL HIGH (ref 70–99)
Glucose-Capillary: 146 mg/dL — ABNORMAL HIGH (ref 70–99)
Glucose-Capillary: 158 mg/dL — ABNORMAL HIGH (ref 70–99)

## 2023-03-17 LAB — BASIC METABOLIC PANEL
Anion gap: 10 (ref 5–15)
Anion gap: 7 (ref 5–15)
Anion gap: 6 (ref 5–15)
BUN: 16 mg/dL (ref 8–23)
BUN: 25 mg/dL — ABNORMAL HIGH (ref 8–23)
BUN: 30 mg/dL — ABNORMAL HIGH (ref 8–23)
CO2: 20 mmol/L — ABNORMAL LOW (ref 22–32)
CO2: 20 mmol/L — ABNORMAL LOW (ref 22–32)
CO2: 22 mmol/L (ref 22–32)
Calcium: 8.5 mg/dL — ABNORMAL LOW (ref 8.9–10.3)
Calcium: 8.4 mg/dL — ABNORMAL LOW (ref 8.9–10.3)
Calcium: 8.3 mg/dL — ABNORMAL LOW (ref 8.9–10.3)
Chloride: 106 mmol/L (ref 98–111)
Chloride: 105 mmol/L (ref 98–111)
Chloride: 100 mmol/L (ref 98–111)
Creatinine, Ser: 0.75 mg/dL (ref 0.61–1.24)
Creatinine, Ser: 1.25 mg/dL — ABNORMAL HIGH (ref 0.61–1.24)
Creatinine, Ser: 1.31 mg/dL — ABNORMAL HIGH (ref 0.61–1.24)
GFR, Estimated: >60 mL/min (ref >60)
GFR, Estimated: >60 mL/min (ref >60)
GFR, Estimated: >60 mL/min (ref >60)
Glucose, Bld: 131 mg/dL — ABNORMAL HIGH (ref 70–99)
Glucose, Bld: 134 mg/dL — ABNORMAL HIGH (ref 70–99)
Glucose, Bld: 150 mg/dL — ABNORMAL HIGH (ref 70–99)
Potassium: 4.7 mmol/L (ref 3.5–5.1)
Potassium: 4.9 mmol/L (ref 3.5–5.1)
Potassium: 4.9 mmol/L (ref 3.5–5.1)
Sodium: 136 mmol/L (ref 135–145)
Sodium: 132 mmol/L — ABNORMAL LOW (ref 135–145)
Sodium: 128 mmol/L — ABNORMAL LOW (ref 135–145)

## 2023-03-17 LAB — MAGNESIUM
Magnesium: 2.4 mg/dL (ref 1.7–2.4)
Magnesium: 2.4 mg/dL (ref 1.7–2.4)
Magnesium: 2.4 mg/dL (ref 1.7–2.4)

## 2023-03-17 MED ORDER — FUROSEMIDE 10 MG/ML IJ SOLN
20.0000 mg | Freq: Once | INTRAMUSCULAR | Status: AC
  Administered 2023-03-17: 20 mg via INTRAVENOUS
  Filled 2023-03-17: qty 2

## 2023-03-17 MED ORDER — INSULIN ASPART 100 UNIT/ML IJ SOLN: 0.0000 [IU] | INTRAMUSCULAR | Status: DC

## 2023-03-17 MED ORDER — WARFARIN - PHARMACIST DOSING INPATIENT: Freq: Every day | Status: DC

## 2023-03-17 MED ORDER — INSULIN ASPART 100 UNIT/ML IJ SOLN: 0.0000 [IU] | Freq: Three times a day (TID) | INTRAMUSCULAR | Status: DC

## 2023-03-17 MED ORDER — WARFARIN SODIUM 2.5 MG PO TABS
2.5000 mg | ORAL_TABLET | Freq: Once | ORAL | Status: AC
Start: 2023-03-17 — End: 2023-03-17
  Administered 2023-03-17: 2.5 mg via ORAL
  Filled 2023-03-17: qty 1

## 2023-03-17 MED ORDER — INSULIN ASPART 100 UNIT/ML IJ SOLN
0.0000 [IU] | Freq: Three times a day (TID) | INTRAMUSCULAR | Status: DC
Start: 2023-03-17 — End: 2023-03-19
  Administered 2023-03-17: 4 [IU] via SUBCUTANEOUS
  Administered 2023-03-17: 2 [IU] via SUBCUTANEOUS

## 2023-03-17 NOTE — Progress Notes (Signed)
PHARMACY - ANTICOAGULATION CONSULT NOTE  Pharmacy Consult for warfarin Indication: mechanical AVR and MVR  Allergies  Allergen Reactions   Hydrocodone Rash    Patient Measurements: Height: 5\' 11"  (180.3 cm) Weight: 80.9 kg (178 lb 5.6 oz) IBW/kg (Calculated) : 75.3   Vital Signs: Temp: 97 F (36.1 C) (01/18 0645) BP: 101/66 (01/18 0600) Pulse Rate: 90 (01/18 0645)  Labs: Recent Labs    03/15/23 1834 03/15/23 2355 03/16/23 1156 03/16/23 1306 03/16/23 1330 03/16/23 1900 03/17/23 0007 03/17/23 0107 03/17/23 0419  HGB  --    < > 8.8* 8.0*   < > 7.9* 7.5* 7.5* 7.6*  HCT  --    < > 26.0* 24.4*   < > 24.1* 22.0* 22.0* 23.7*  PLT  --    < >  --  107*  --  91*  --   --  81*  APTT  --   --   --  42*  --   --   --   --   --   LABPROT 15.5*  --   --  20.1*  --   --   --   --   --   INR 1.2  --   --  1.7*  --   --   --   --   --   CREATININE  --    < > 0.60*  --   --  0.81  --   --  0.75   < > = values in this interval not displayed.    Estimated Creatinine Clearance: 102 mL/min (by C-G formula based on SCr of 0.75 mg/dL).   Medical History: Past Medical History:  Diagnosis Date   Blood dyscrasia    GERD (gastroesophageal reflux disease)    History of kidney stones    Hypertension    Thrombocytopenia (HCC) 04/12/2016    Medications:  Medications Prior to Admission  Medication Sig Dispense Refill Last Dose/Taking   acetaminophen (TYLENOL) 650 MG CR tablet Take 1,300 mg by mouth every 8 (eight) hours as needed for pain.   Past Week   ALPRAZolam (XANAX) 1 MG tablet Take 1 mg by mouth at bedtime as needed for anxiety.   Past Week   ibuprofen (ADVIL) 800 MG tablet Take 1 tablet (800 mg total) by mouth 3 (three) times daily. Take with food (Patient taking differently: Take 200 mg by mouth every 8 (eight) hours as needed for fever, headache or mild pain (pain score 1-3). Take with food) 60 tablet 0 Taking Differently   olmesartan (BENICAR) 40 MG tablet Take 40 mg by mouth  daily.   Past Week   omeprazole (PRILOSEC OTC) 20 MG tablet Take 20 mg by mouth daily.   03/07/2023   oxyCODONE (ROXICODONE) 5 MG immediate release tablet Take 1 tablet (5 mg total) by mouth every 6 (six) hours as needed. (Patient not taking: Reported on 03/08/2023) 30 tablet 0 Not Taking   DULoxetine (CYMBALTA) 60 MG capsule Take 60 mg by mouth daily. (Patient not taking: Reported on 03/08/2023)   Not Taking   Scheduled:   acetaminophen  1,000 mg Oral Q6H   Or   acetaminophen (TYLENOL) oral liquid 160 mg/5 mL  1,000 mg Per Tube Q6H   atorvastatin  40 mg Oral Daily   bisacodyl  10 mg Oral Daily   Or   bisacodyl  10 mg Rectal Daily   Chlorhexidine Gluconate Cloth  6 each Topical Daily   docusate sodium  200 mg  Oral Daily   folic acid  1 mg Oral Daily   furosemide  20 mg Intravenous Once   insulin aspart  0-24 Units Subcutaneous TID WC   metoCLOPramide (REGLAN) injection  10 mg Intravenous Q6H   mupirocin ointment  1 Application Nasal BID   [START ON 03/18/2023] pantoprazole  40 mg Oral Daily   pantoprazole (PROTONIX) IV  40 mg Intravenous QHS   sodium chloride flush  10-40 mL Intracatheter Q12H   sodium chloride flush  3 mL Intravenous Q12H   thiamine  100 mg Oral Daily   Or   thiamine  100 mg Intravenous Daily    Assessment: 63 yo male with severe MR and AVR 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 Pharmacy consulted to dose warfarin. No anticoagulants noted PTA  -INR= 1.7 on 1/18 -hg= 7.6, plt= 81  Goal of Therapy:  INR goal 2.5-3.5 (discuss with Dr. Dorris Fetch) Monitor platelets by anticoagulation protocol: Yes   Plan:  -Warfarin 2,5mg  po today -Daily PT/INR  Harland German, PharmD Clinical Pharmacist **Pharmacist phone directory can now be found on [email or web address] ([email or web address]).  Listed under [facility].

## 2023-03-17 NOTE — Progress Notes (Signed)
      [address]       [place] [address]             [phone]      Resting comfortably in bed  BP (!) 140/75   Pulse 84   Temp 98.2 F (36.8 C) (Oral)   Resp 13   Ht 5\' 11"  (1.803 m)   Wt 80.9 kg   SpO2 94%   BMI 24.88 kg/m  IN SR at 80 with long PR interval Pacer changed to VVI at 60   Intake/Output Summary (Last 24 hours) at 03/17/2023 1738 Last data filed at 03/17/2023 1700 Gross per 24 hour  Intake 1820.88 ml  Output 1605 ml  Net 215.88 ml   Creatinine up to 1.25, K 4.9 Hgb 7.4 PLT up to 109K  Will give 20 mg of Lasix IV again tonight  Thomas Humphres C. Dorris Fetch, MD [facility] [phone]

## 2023-03-17 NOTE — Progress Notes (Signed)
RN taken pt off BIPAP, pt placed on 4L [place], pts vitals are stable.

## 2023-03-17 NOTE — Progress Notes (Signed)
NAME:  Thomas [name], MRN:  [id number], DOB:  [birth year], LOS: 9 ADMISSION DATE:  03/08/2023, CONSULTATION DATE:  03/16/23  REFERRING MD:  Mammie Russian, CHIEF COMPLAINT:  Respiratory status change    History of Present Illness:  63 yo M PMH etoh use disorder, thrombocytopenia, s/p L nephrectomy, s/p R ulnar shortening 09/2022 was admitted to [facility] 1/9 after presenting w generalized malaise, SOB. Admitted for suspected decomp HF. Had an echo 1/10 which revealed severe AI and a density attached to the AV leaflet. Bcx obtained 1/11 -- resulted w strep infantarius.  ID consulted 1/12 for this, rec rocephin and TEE which occurred 1/13 and revealed severe AI w AV vegetation, Severe MR w MV vegetation possible perf and flail segment + Moderate AS. CVTS was consulted 1/14 in this setting. 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 Abx changed to pen G when bcx resulted pan sensitive   Underwent MV replacement, AV replacement, drainage of bilat pleural effusions 1/17 with CVTS.  Post op had some hives which improved w benadryl and solumedrol. Fairly vasodilated and on epi and NE.  Extubated. Following extubation had a change in resp status with poor cough/gag, decr effort.  PCCM consulted in this setting   Pertinent  Medical History  Etoh use HTN L nephrectomy CHF thrombocytopenia  Significant Hospital Events: Including procedures, antibiotic start and stop dates in addition to other pertinent events   1/9 admit to [facility].  1/10 abnormal echo c/f AV vegetation 1/11 bcx w strep 1/12 ID consult abx to rocephin  1/13 TEE -- AV and MV endocarditis, possible MV perf. Moderate AS.   1/14 CVTS consult. Cards consult for cath -- single vessel dz of proximal and mid LAD 1/15 abx changed to Pen G -- pan sensitive strep 1/17 AV replacement MV replacement pleural effusion drainage. PCCM consulted post extubation  Interim History / Subjective:   No events, wants ginger ale; some discomfort from bed.  Objective   Blood pressure 101/66, pulse 90, temperature (!) 97 F (36.1 C), resp. rate (!) 22, height 5\' 11"  (1.803 m), weight 80.9 kg, SpO2 97%. PAP: (15-47)/(7-22) 30/19 CVP:  [5 mmHg-20 mmHg] 14 mmHg CO:  [5.3 L/min-11 L/min] 6.3 L/min CI:  [2.7 L/min/m2-5.6 L/min/m2] 3.2 L/min/m2  Vent Mode: PCV;BIPAP FiO2 (%):  [40 %-60 %] 60 % Set Rate:  [4 bmp-24 bmp] 24 bmp Vt Set:  [600 mL] 600 mL PEEP:  [5 cmH20] 5 cmH20 Pressure Support:  [10 cmH20] 10 cmH20   Intake/Output Summary (Last 24 hours) at 03/17/2023 0723 Last data filed at 03/17/2023 0600 Gross per 24 hour  Intake 7364.32 ml  Output 2030 ml  Net 5334.32 ml   Filed Weights   03/15/23 0511 03/16/23 0420 03/17/23 0500  Weight: 77.5 kg 76.5 kg 80.9 kg    Examination: No distress Minimal drain output More alert than yesterday Inspiratory effort better Sternotomy dressed Moves to command No edema Aox3  H/H, plts down slightly but not bad CXR better aeration but still some venous congestion likely   sodium chloride     sodium chloride     sodium chloride 10 mL/hr at 03/17/23 0600    ceFAZolin (ANCEF) IV 2 g (03/17/23 0644)   dexmedetomidine (PRECEDEX) IV infusion Stopped (03/17/23 0450)   epinephrine 4 mcg/min (03/17/23 0600)   lactated ringers     lactated ringers 20 mL/hr at 03/17/23 0600   norepinephrine (LEVOPHED) Adult infusion Stopped (03/16/23  2047)   penicillin G potassium 12 Million Units in sodium chloride 0.9 % 500 mL CONTINUOUS infusion 41.7 mL/hr at 03/17/23 0600     Resolved Hospital Problem list     Assessment & Plan:   Encephalopathy, post op - improved Postop vent management- resolved, lingering atelectasis and fluid should improve with time Severe AI s/p AVR  Severe mitral regurg s/p MVR  AV and MV Strep infantarius Endocarditis  Vasoplegia   Hives postop, improved  AoC diastolic HF  EtOH [place] disorder   - Pressors for MAP  65 - Consider diuretics - Start clear liquid diet - BIPAP to PRN - Drain management per primary - Encourage IS, braced coughing - Insulin to basal bolus - Encourage day/night cycles - Multimodal pain strategy - PT/OT etc  Best Practice (right click and "Reselect all SmartList Selections" daily)   Diet/type: clears DVT prophylaxis other per cvts Pressure ulcer(s): N/A GI prophylaxis: H2B and PPI Lines: Central line, Arterial Line, and yes and it is still needed Foley:  Yes, and it is still needed Code Status:  full code Last date of multidisciplinary goals of care discussion [--]  31 min cc time Myrla Halsted MD PCCM

## 2023-03-17 NOTE — Progress Notes (Signed)
   03/17/23 2325  BiPAP/CPAP/SIPAP  $ Non-Invasive Ventilator  Non-Invasive Vent Subsequent  BiPAP/CPAP/SIPAP Pt Type Adult  BiPAP/CPAP/SIPAP SERVO  Reason BIPAP/CPAP not in use Non-compliant  Mask Type Full face mask  Mask Size Large  Patient Home Equipment No  BiPAP/CPAP /SiPAP Vitals  BP (!) 152/82  Bilateral Breath Sounds Clear;Diminished  MEWS Score/Color  MEWS Score 0  MEWS Score Color Thomas [name]

## 2023-03-17 NOTE — Plan of Care (Signed)

## 2023-03-17 NOTE — Progress Notes (Signed)
1 Day Post-Op Procedure(s) (LRB): AORTIC VALVE REPLACEMENT (AVR) USING ON-X PROSTHETIC AORTIC HEART VALVE WITH ANATOMIC SEWING RING SIZE (N/A) MITRAL VALVE (MV) REPLACEMENT USING ON-X PROSTHETIC MITRAL HEART VALVE WITH STANDARD SEWING RING SIZE 31/33MM (N/A) TRANSESOPHAGEAL ECHOCARDIOGRAM (TEE) (N/A) Subjective: Some incisional pain  Objective: Vital signs in last 24 hours: Temp:  [94.3 F (34.6 C)-98.2 F (36.8 C)] 97 F (36.1 C) (01/18 0645) Pulse Rate:  [42-100] 90 (01/18 0645) Cardiac Rhythm: A-V Sequential paced (01/17 2000) Resp:  [14-30] 22 (01/18 0645) BP: (84-124)/(39-69) 101/66 (01/18 0600) SpO2:  [88 %-100 %] 97 % (01/18 0645) Arterial Line BP: (69-156)/(25-66) 136/55 (01/18 0645) FiO2 (%):  [40 %-60 %] 60 % (01/17 2321) Weight:  [80.9 kg] 80.9 kg (01/18 0500)  Hemodynamic parameters for last 24 hours: PAP: (15-47)/(7-22) 30/19 CVP:  [5 mmHg-20 mmHg] 14 mmHg CO:  [5.3 L/min-11 L/min] 7.1 L/min CI:  [2.7 L/min/m2-5.6 L/min/m2] 3.6 L/min/m2  Intake/Output from previous day: 01/17 0701 - 01/18 0700 In: 7364.3 [I.V.:3677.1; Blood:443; IV Piggyback:3244.3] Out: 2030 [Urine:1360; Chest Tube:670] Intake/Output this shift: No intake/output data recorded.  General appearance: alert, cooperative, and no distress Neurologic: intact Heart: regular rate and rhythm Lungs: diminished breath sounds bibasilar Abdomen: normal findings: soft, non-tender  Lab Results: Recent Labs    03/16/23 1900 03/17/23 0007 03/17/23 0107 03/17/23 0419  WBC 9.4  --   --  9.0  HGB 7.9*   < > 7.5* 7.6*  HCT 24.1*   < > 22.0* 23.7*  PLT 91*  --   --  81*   < > = values in this interval not displayed.   BMET:  Recent Labs    03/16/23 1900 03/17/23 0007 03/17/23 0107 03/17/23 0419  NA 137   < > 141 136  K 4.3   < > 4.5 4.7  CL 108  --   --  106  CO2 22  --   --  20*  GLUCOSE 168*  --   --  131*  BUN 13  --   --  16  CREATININE 0.81  --   --  0.75  CALCIUM 8.5*  --   --   8.5*   < > = values in this interval not displayed.    PT/INR:  Recent Labs    03/16/23 1306  LABPROT 20.1*  INR 1.7*   ABG    Component Value Date/Time   PHART 7.344 (L) 03/17/2023 0107   HCO3 22.3 03/17/2023 0107   TCO2 24 03/17/2023 0107   ACIDBASEDEF 3.0 (H) 03/17/2023 0107   O2SAT 97 03/17/2023 0107   CBG (last 3)  Recent Labs    03/17/23 0413 03/17/23 0634 03/17/23 0736  GLUCAP 137* 106* 158*    Assessment/Plan: S/P Procedure(s) (LRB): AORTIC VALVE REPLACEMENT (AVR) USING ON-X PROSTHETIC AORTIC HEART VALVE WITH ANATOMIC SEWING RING SIZE (N/A) MITRAL VALVE (MV) REPLACEMENT USING ON-X PROSTHETIC MITRAL HEART VALVE WITH STANDARD SEWING RING SIZE 31/33MM (N/A) TRANSESOPHAGEAL ECHOCARDIOGRAM (TEE) (N/A) NEURO- intact CV- s/p AVR, MVR  Good hemodynamics- Ci 3.6 with low filling pressures  Off pressors  Dc Swan and A line  Warfarin for mechanical valves  In SR in 50's , A paces with long PR- hold metoprolol RESP- CXR looks good, IS RENAL- creatinine and lytes normal  Will give IV lasix this AM ENDO- CBG mildly elevated  SSI GI- advance diet as tolerated Anemia - acute secondary to ABL on chronic transfused intra-op Hgb 7.5- monitor Thrombocytopenia- PLT 81K down from 91K  No bleeding issues  Monitor, no enoxaparin Dc chest tubes Cardiac rehab   LOS: 9 days    [name] 03/17/2023

## 2023-03-18 ENCOUNTER — Inpatient Hospital Stay (HOSPITAL_COMMUNITY): Payer: 59

## 2023-03-18 DIAGNOSIS — I4430 Unspecified atrioventricular block: Secondary | ICD-10-CM | POA: Diagnosis not present

## 2023-03-18 DIAGNOSIS — I5033 Acute on chronic diastolic (congestive) heart failure: Secondary | ICD-10-CM | POA: Diagnosis not present

## 2023-03-18 LAB — BASIC METABOLIC PANEL
Anion gap: 8 (ref 5–15)
BUN: 33 mg/dL — ABNORMAL HIGH (ref 8–23)
CO2: 21 mmol/L — ABNORMAL LOW (ref 22–32)
Calcium: 8.5 mg/dL — ABNORMAL LOW (ref 8.9–10.3)
Chloride: 102 mmol/L (ref 98–111)
Creatinine, Ser: 1.27 mg/dL — ABNORMAL HIGH (ref 0.61–1.24)
GFR, Estimated: >60 mL/min (ref >60)
Glucose, Bld: 114 mg/dL — ABNORMAL HIGH (ref 70–99)
Potassium: 5.2 mmol/L — ABNORMAL HIGH (ref 3.5–5.1)
Sodium: 131 mmol/L — ABNORMAL LOW (ref 135–145)

## 2023-03-18 LAB — CBC
HCT: 23.9 % — ABNORMAL LOW (ref 39.0–52.0)
Hemoglobin: 7.6 g/dL — ABNORMAL LOW (ref 13.0–17.0)
MCH: 28.3 pg (ref 26.0–34.0)
MCHC: 31.8 g/dL (ref 30.0–36.0)
MCV: 88.8 fL (ref 80.0–100.0)
Platelets: 122 10*3/uL — ABNORMAL LOW (ref 150–400)
RBC: 2.69 MIL/uL — ABNORMAL LOW (ref 4.22–5.81)
RDW: 18 % — ABNORMAL HIGH (ref 11.5–15.5)
WBC: 13.2 10*3/uL — ABNORMAL HIGH (ref 4.0–10.5)
nRBC: 0 % (ref 0.0–0.2)

## 2023-03-18 LAB — PROTIME-INR
INR: 1.4 — ABNORMAL HIGH (ref 0.8–1.2)
Prothrombin Time: 17.1 s — ABNORMAL HIGH (ref 11.4–15.2)

## 2023-03-18 LAB — GLUCOSE, CAPILLARY
Glucose-Capillary: 90 mg/dL (ref 70–99)
Glucose-Capillary: 76 mg/dL (ref 70–99)
Glucose-Capillary: 105 mg/dL — ABNORMAL HIGH (ref 70–99)
Glucose-Capillary: 89 mg/dL (ref 70–99)

## 2023-03-18 MED ORDER — SENNA 8.6 MG PO TABS
1.0000 | ORAL_TABLET | Freq: Every day | ORAL | Status: DC
  Administered 2023-03-18: 8.6 mg via ORAL
  Filled 2023-03-18: qty 1

## 2023-03-18 MED ORDER — WARFARIN SODIUM 5 MG PO TABS
5.0000 mg | ORAL_TABLET | Freq: Once | ORAL | Status: AC
  Administered 2023-03-18: 5 mg via ORAL
  Filled 2023-03-18: qty 1

## 2023-03-18 MED ORDER — DOCUSATE SODIUM 100 MG PO CAPS
200.0000 mg | ORAL_CAPSULE | Freq: Two times a day (BID) | ORAL | Status: DC
  Administered 2023-03-18 – 2023-03-26 (×13): 200 mg via ORAL
  Filled 2023-03-18 (×16): qty 2

## 2023-03-18 MED ORDER — FUROSEMIDE 10 MG/ML IJ SOLN
40.0000 mg | Freq: Once | INTRAMUSCULAR | Status: AC
  Administered 2023-03-18: 40 mg via INTRAVENOUS
  Filled 2023-03-18: qty 4

## 2023-03-18 MED ORDER — POLYETHYLENE GLYCOL 3350 17 G PO PACK
17.0000 g | PACK | Freq: Every day | ORAL | Status: DC
  Administered 2023-03-18 – 2023-03-25 (×4): 17 g via ORAL
  Filled 2023-03-18 (×8): qty 1

## 2023-03-18 MED ORDER — AMLODIPINE BESYLATE 10 MG PO TABS
10.0000 mg | ORAL_TABLET | Freq: Every day | ORAL | Status: DC
  Administered 2023-03-18 – 2023-03-26 (×9): 10 mg via ORAL
  Filled 2023-03-18 (×9): qty 1

## 2023-03-18 MED ORDER — IRBESARTAN 150 MG PO TABS
150.0000 mg | ORAL_TABLET | Freq: Every day | ORAL | Status: DC
  Administered 2023-03-18 – 2023-03-21 (×4): 150 mg via ORAL
  Filled 2023-03-18 (×5): qty 1

## 2023-03-18 NOTE — Consult Note (Signed)
ELECTROPHYSIOLOGY CONSULT NOTE    Patient ID: Thomas [name] MRN: [id number], DOB/AGE: 05-12-1960 63 y.o.  Admit date: 03/08/2023 Date of Consult: 03/18/2023  Primary Physician: Assunta Found, MD Primary Cardiologist: none Electrophysiologist: New  Patient Profile: Thomas [name] is a 63 y.o. male with a history of severe aortic and mitral valve regurgitation, s/p replacement 1/17; left nephrectomy, remote substance abuse, thrombocytopenia who is being seen today for the evaluation of postop heart block at the request of Dr. Dorris Fetch.  HPI:  Thomas [name] is a 63 y.o. male with the above described past medical history who underwent mitral and aortic valve replacement by Dr. Dorris Fetch on January 17 for severe aortic and mitral valve endocarditis.  He has undergone multiple surgeries for a right wrist fracture sustained, per the patient, almost a year ago.  The most recent surgery was on February 27, 2023, and since that time he has been having intermittent fevers and increasing shortness of breath, fatigue, and edema.  He was admitted on March 07, 2022 with evidence of congestive heart failure.  He was treated for diastolic heart failure, but TTE on January 10 showed severe AI with vegetation, confirmed by subsequent TEE that also showed severe MR with evidence of endocarditis on both valves.  Blood cultures positive for Streptococcus species.  He underwent placement of Onyx valve in the mitral and aortic positions by Dr. Dorris Fetch on January 17.  Since, his telemetry has demonstrated sinus rhythm with nonspecific intraventricular conduction delay followed by an accelerated junctional rhythm, and now sinus rhythm with AV block.  He is currently a sensed and V paced at over 80 bpm.   Past Medical History:  Diagnosis Date   Blood dyscrasia    GERD (gastroesophageal reflux disease)    History of kidney stones    Hypertension    Thrombocytopenia (HCC) 04/12/2016      Home  medications Medications Prior to Admission  Medication Sig Dispense Refill Last Dose/Taking   acetaminophen (TYLENOL) 650 MG CR tablet Take 1,300 mg by mouth every 8 (eight) hours as needed for pain.   Past Week   ALPRAZolam (XANAX) 1 MG tablet Take 1 mg by mouth at bedtime as needed for anxiety.   Past Week   ibuprofen (ADVIL) 800 MG tablet Take 1 tablet (800 mg total) by mouth 3 (three) times daily. Take with food (Patient taking differently: Take 200 mg by mouth every 8 (eight) hours as needed for fever, headache or mild pain (pain score 1-3). Take with food) 60 tablet 0 Taking Differently   olmesartan (BENICAR) 40 MG tablet Take 40 mg by mouth daily.   Past Week   omeprazole (PRILOSEC OTC) 20 MG tablet Take 20 mg by mouth daily.   03/07/2023   oxyCODONE (ROXICODONE) 5 MG immediate release tablet Take 1 tablet (5 mg total) by mouth every 6 (six) hours as needed. (Patient not taking: Reported on 03/08/2023) 30 tablet 0 Not Taking   DULoxetine (CYMBALTA) 60 MG capsule Take 60 mg by mouth daily. (Patient not taking: Reported on 03/08/2023)   Not Taking      Physical Exam: Vitals:   03/18/23 1100 03/18/23 1122 03/18/23 1130 03/18/23 1200  BP: (!) 108/54  123/88 124/61  Pulse: 81  90 81  Resp: 16  20 (!) 23  Temp:  98.6 F (37 C)    TempSrc:  Oral    SpO2: 98%  95% 99%  Weight:      Height:  Gen: Appears comfortable, well-nourished CV: RRR, no dependent edema; + murmur Sternal ound bandaged Pulm: breathing easily; bibasilar rales  PERTINENT STUDIES SUMMARIZED:  Echocardiogram:      TEE 03/12/2023: Aortic valve vegetation with severe regurgitation, moderate aortic valve stenosis.  Severe concentric mitral regurgitation with agitation of the A3 segment, flail A1 A2 segment with likely leaflet perforation.  EF 60 to 65%.  Left atrium mildly dilated.  Grade 2 aortic plaque.  Heart Cath: Moderate proximal to distal LAD disease  Imaging: CXR March 18, 2023: Images reviewed.  Atrial  and ventricular temporary wires in place.   EKG: ECG January 18: Sinus rhythm with severely prolonged AV interval.  Nonspecific intraventricular conduction delay (personally reviewed)  TELEMETRY:    A-sensed, V-paced (personally reviewed)   ASSESSMENT & PLAN:  Heart block Post-operative Continue to monitor Pt has had competing junctional rhythm and sinus rhythm with AV conduction since surgery; now A-sensed, V-paced We will continue to monitor throughout the recovery process. Continue to hold AV blocking agents  Streptococcus infantaris infective endocarditis; mechanical Ao and MV Status post placement of mechanical valves January 17 Continue penicillin G per ID On warfarin, INR 1.4 today He will need to be therapeutic on warfarin prior to placement of a permanent pacemaker, if needed.  Acute on chronic CHFpEF Appears relatively compensated        For questions or updates, please contact [facility] Please consult [email or web address] for contact info under Cardiology/STEMI.  Signed, York Pellant, MD 03/18/2023 1:03 PM

## 2023-03-18 NOTE — Evaluation (Signed)
Physical Therapy Evaluation & Discharge Patient Details Name: Thomas [name] MRN: [id number] DOB: Mar 14, 1960 Today's Date: 03/18/2023  History of Present Illness  63 y.o. male admitted 03/08/23 with malaise, SOB. Workup for decompensated HF. TEE 1/13 showed severe AI with AV vegetation, severe MR with MV vegetation. S/p MVR, AVR, bilateral pleural effusion drainage 1/17. ETT -1/17. PMH includes HTN, s/p L nephrectomy, R wrist fx (s/p multiple sxs, most recently 01/2023; to be RUE NWB in sling when OOB), remote substance use.   Clinical Impression  Patient evaluated by Physical Therapy with no further acute PT needs identified. PTA, pt independent, active, works, lives with supportive partner. Today, pt moving well with good ability to maintain sternal precautions and RUE NWB (recent sx). Pt performing seated/standing therex and higher level balance tasks without assist. Educ pt and partner re: sternal prec, activity recommendations, pulmonary hygiene, importance of mobility. All education has been completed and the patient has no further questions. Recommend continued mobility with nursing staff, cardiac rehab and mobility specialists. Acute PT is signing off. Thank you for this referral.    Standing BP 115/69    If plan is discharge home, recommend the following: A little help with bathing/dressing/bathroom;Assistance with cooking/housework;Assist for transportation   Can travel by private vehicle    Yes    Equipment Recommendations None recommended by PT  Recommendations for Other Services   Mobility Specialist, Cardiac Rehab    Functional Status Assessment       Precautions / Restrictions Precautions Precautions: Sternal Precaution Comments: pt and partner already have pre-op and sternal precautions handout Restrictions Weight Bearing Restrictions Per Provider Order: Yes RUE Weight Bearing Per Provider Order: Non weight bearing LUE Weight Bearing Per Provider Order: Non weight  bearing Other Position/Activity Restrictions: recent R wrist sx in hard cast      Mobility  Bed Mobility Overal bed mobility: Modified Independent             General bed mobility comments: HOB elevated; educ on technique for maintaining sternal prec without holding heart pillow    Transfers Overall transfer level: Needs assistance Equipment used: None Transfers: Sit to/from Stand Sit to Stand: Supervision           General transfer comment: 10x repeated sit<>stand from EOB without UE support, supervision for line management    Ambulation/Gait Ambulation/Gait assistance: Supervision           Pre-gait activities: pt declined ambulation secondary to feeling woozy on pain meds; pt taking steps at EOB and performing higher level balance tasks without assist    Stairs            Wheelchair Mobility     Tilt Bed    Modified Rankin (Stroke Patients Only)       Balance Overall balance assessment: Needs assistance Sitting-balance support: No upper extremity supported, Feet supported Sitting balance-Leahy Scale: Good     Standing balance support: No upper extremity supported, During functional activity Standing balance-Leahy Scale: Good   Single Leg Stance - Right Leg: 5 Single Leg Stance - Left Leg: 10     Rhomberg - Eyes Opened: 30 Rhomberg - Eyes Closed: 30                 Pertinent Vitals/Pain Pain Assessment Pain Assessment: Faces Faces Pain Scale: Hurts a little bit Pain Location: sternal incision Pain Descriptors / Indicators: Discomfort Pain Intervention(s): Monitored during session    Home Living Family/patient expects to be discharged to:: Private residence Living  Arrangements: Spouse/significant other Available Help at Discharge: Family;Available PRN/intermittently Type of Home: House Home Access: Stairs to enter Entrance Stairs-Rails: Right Entrance Stairs-Number of Steps: 4-5 Alternate Level Stairs-Number of Steps:  flight Home Layout: Multi-level;Bed/bath upstairs Home Equipment: None      Prior Function Prior Level of Function : Independent/Modified Independent;Working/employed;Driving             Mobility Comments: independent without DME, 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), enjoys golfing ADLs Comments: independent     Extremity/Trunk Assessment   Upper Extremity Assessment Upper Extremity Assessment: Right hand dominant;RUE deficits/detail RUE Deficits / Details: R forearm/wrist cast s/p recent R ulnar shaft repair and pinning distal radial ulnar joint    Lower Extremity Assessment Lower Extremity Assessment: Overall WFL for tasks assessed       Communication   Communication Communication: No apparent difficulties  Cognition Arousal: Alert Behavior During Therapy: WFL for tasks assessed/performed Overall Cognitive Status: Within Functional Limits for tasks assessed                                 General Comments: reports feeling woozy on pain meds        General Comments General comments (skin integrity, edema, etc.): pt's partner Burnett Harry) present and supportive, has good awareness of pre-op instructions and sternal precautions. educ pt re: precautions, positioning, activity recommendations, importance of mobility, pulmonary hygiene, discharge needs    Exercises Other Exercises Other Exercises: 30-sec sit-to-stand (without UE support) - pt performing 10 reps Other Exercises: incentive spirometer x5 - technique corrected and pt pulling ~568mL   Assessment/Plan    PT Assessment Patient does not need any further PT services  PT Problem List         PT Treatment Interventions      PT Goals (Current goals can be found in the Care Plan section)  Acute Rehab PT Goals PT Goal Formulation: All assessment and education complete, DC therapy    Frequency       Co-evaluation               AM-PAC PT "6 Clicks" Mobility  Outcome Measure  Help needed turning from your back to your side while in a flat bed without using bedrails?: None Help needed moving from lying on your back to sitting on the side of a flat bed without using bedrails?: None Help needed moving to and from a bed to a chair (including a wheelchair)?: None Help needed standing up from a chair using your arms (e.g., wheelchair or bedside chair)?: None Help needed to walk in hospital room?: A Little Help needed climbing 3-5 steps with a railing? : A Little 6 Click Score: 22    End of Session   Activity Tolerance: Patient tolerated treatment well;Patient limited by fatigue Patient left: in bed;with call bell/phone within reach;with family/visitor present Nurse Communication: Mobility status PT Visit Diagnosis: Other abnormalities of gait and mobility (R26.89)    Time: 1406-1430 PT Time Calculation (min) (ACUTE ONLY): 24 min   Charges:   PT Evaluation $PT Eval Moderate Complexity: 1 Mod PT Treatments $Therapeutic Activity: 8-22 mins PT General Charges $$ ACUTE PT VISIT: 1 Visit        Ina Homes, PT, DPT Acute Rehabilitation Services  Personal: Secure Chat Rehab Office: [phone]  Malachy Chamber 03/18/2023, 3:20 PM

## 2023-03-18 NOTE — Progress Notes (Signed)
PHARMACY - ANTICOAGULATION CONSULT NOTE  Pharmacy Consult for warfarin Indication: mechanical AVR and MVR  Allergies  Allergen Reactions   Hydrocodone Rash    Patient Measurements: Height: 5\' 11"  (180.3 cm) Weight: 80.3 kg (177 lb 0.5 oz) IBW/kg (Calculated) : 75.3   Vital Signs: Temp: 98.6 F (37 C) (01/19 1122) Temp Source: Oral (01/19 1122) BP: 103/55 (01/19 1300) Pulse Rate: 84 (01/19 1300)  Labs: Recent Labs    03/15/23 1834 03/15/23 2355 03/16/23 1306 03/16/23 1330 03/17/23 0419 03/17/23 1600 03/17/23 2233 03/18/23 0450  HGB  --    < > 8.0*   < > 7.6* 7.4*  --  7.6*  HCT  --    < > 24.4*   < > 23.7* 24.0*  --  23.9*  PLT  --    < > 107*   < > 81* 109*  --  122*  APTT  --   --  42*  --   --   --   --   --   LABPROT 15.5*  --  20.1*  --   --   --   --  17.1*  INR 1.2  --  1.7*  --   --   --   --  1.4*  CREATININE  --    < >  --    < > 0.75 1.25* 1.31* 1.27*   < > = values in this interval not displayed.    Estimated Creatinine Clearance: 64.2 mL/min (A) (by C-G formula based on SCr of 1.27 mg/dL (H)).   Medical History: Past Medical History:  Diagnosis Date   Blood dyscrasia    GERD (gastroesophageal reflux disease)    History of kidney stones    Hypertension    Thrombocytopenia (HCC) 04/12/2016    Medications:  Medications Prior to Admission  Medication Sig Dispense Refill Last Dose/Taking   acetaminophen (TYLENOL) 650 MG CR tablet Take 1,300 mg by mouth every 8 (eight) hours as needed for pain.   Past Week   ALPRAZolam (XANAX) 1 MG tablet Take 1 mg by mouth at bedtime as needed for anxiety.   Past Week   ibuprofen (ADVIL) 800 MG tablet Take 1 tablet (800 mg total) by mouth 3 (three) times daily. Take with food (Patient taking differently: Take 200 mg by mouth every 8 (eight) hours as needed for fever, headache or mild pain (pain score 1-3). Take with food) 60 tablet 0 Taking Differently   olmesartan (BENICAR) 40 MG tablet Take 40 mg by mouth daily.    Past Week   omeprazole (PRILOSEC OTC) 20 MG tablet Take 20 mg by mouth daily.   03/07/2023   oxyCODONE (ROXICODONE) 5 MG immediate release tablet Take 1 tablet (5 mg total) by mouth every 6 (six) hours as needed. (Patient not taking: Reported on 03/08/2023) 30 tablet 0 Not Taking   DULoxetine (CYMBALTA) 60 MG capsule Take 60 mg by mouth daily. (Patient not taking: Reported on 03/08/2023)   Not Taking   Scheduled:   acetaminophen  1,000 mg Oral Q6H   Or   acetaminophen (TYLENOL) oral liquid 160 mg/5 mL  1,000 mg Per Tube Q6H   amLODipine  10 mg Oral Daily   atorvastatin  40 mg Oral Daily   bisacodyl  10 mg Oral Daily   Or   bisacodyl  10 mg Rectal Daily   Chlorhexidine Gluconate Cloth  6 each Topical Daily   docusate sodium  200 mg Oral Daily   folic acid  1 mg Oral Daily   insulin aspart  0-24 Units Subcutaneous TID WC   irbesartan  150 mg Oral Daily   mupirocin ointment  1 Application Nasal BID   pantoprazole  40 mg Oral Daily   sodium chloride flush  10-40 mL Intracatheter Q12H   sodium chloride flush  3 mL Intravenous Q12H   thiamine  100 mg Oral Daily   Or   thiamine  100 mg Intravenous Daily   warfarin  5 mg Oral ONCE-1600   Warfarin - Pharmacist Dosing Inpatient   Does not apply q1600    Assessment: 63 yo male with severe MR and AVR 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 Pharmacy consulted to dose warfarin. No anticoagulants noted PTA  -INR= 1.4 on 1/19 -hg= 7.6, plt= up to 122  Goal of Therapy:  INR goal 2.5-3.5 (discuss with Dr. Dorris Fetch) Monitor platelets by anticoagulation protocol: Yes   Plan:  -Warfarin 5 mg po today -Daily PT/INR  Jenetta Downer, [facility] Clinical Pharmacist  03/18/2023 1:46 PM   [facility] pharmacy phone numbers are listed on [email or web address]

## 2023-03-18 NOTE — Progress Notes (Signed)
2 Days Post-Op Procedure(s) (LRB): AORTIC VALVE REPLACEMENT (AVR) USING ON-X PROSTHETIC AORTIC HEART VALVE WITH ANATOMIC SEWING RING SIZE (N/A) MITRAL VALVE (MV) REPLACEMENT USING ON-X PROSTHETIC MITRAL HEART VALVE WITH STANDARD SEWING RING SIZE 31/33MM (N/A) TRANSESOPHAGEAL ECHOCARDIOGRAM (TEE) (N/A) Subjective: No complaints  Objective: Vital signs in last 24 hours: Temp:  [97.8 F (36.6 C)-98.7 F (37.1 C)] 97.8 F (36.6 C) (01/19 0733) Pulse Rate:  [63-91] 86 (01/19 0802) Cardiac Rhythm: Heart block (01/19 0730) Resp:  [9-29] 25 (01/19 0802) BP: (125-170)/(75-108) 150/77 (01/19 0802) SpO2:  [93 %-100 %] 96 % (01/19 0802) Arterial Line BP: (126-161)/(48-61) 135/59 (01/18 0945) Weight:  [80.3 kg] 80.3 kg (01/19 0500)  Hemodynamic parameters for last 24 hours:    Intake/Output from previous day: 01/18 0701 - 01/19 0700 In: 2354.3 [P.O.:720; I.V.:316.5; IV Piggyback:1317.8] Out: 1055 [Urine:1005; Chest Tube:50] Intake/Output this shift: No intake/output data recorded.  General appearance: alert, cooperative, and no distress Neurologic: intact Heart: regular rate and rhythm and + clicks Lungs: diminished breath sounds bibasilar Abdomen: normal findings: soft, non-tender  Lab Results: Recent Labs    03/17/23 1600 03/18/23 0450  WBC 14.0* 13.2*  HGB 7.4* 7.6*  HCT 24.0* 23.9*  PLT 109* 122*   BMET:  Recent Labs    03/17/23 2233 03/18/23 0450  NA 128* 131*  K 4.9 5.2*  CL 100 102  CO2 22 21*  GLUCOSE 150* 114*  BUN 30* 33*  CREATININE 1.31* 1.27*  CALCIUM 8.3* 8.5*    PT/INR:  Recent Labs    03/18/23 0450  LABPROT 17.1*  INR 1.4*   ABG    Component Value Date/Time   PHART 7.344 (L) 03/17/2023 0107   HCO3 22.3 03/17/2023 0107   TCO2 24 03/17/2023 0107   ACIDBASEDEF 3.0 (H) 03/17/2023 0107   O2SAT 97 03/17/2023 0107   CBG (last 3)  Recent Labs    03/17/23 1601 03/17/23 2152 03/18/23 0614  GLUCAP 138* 142* 90    Assessment/Plan: S/P  Procedure(s) (LRB): AORTIC VALVE REPLACEMENT (AVR) USING ON-X PROSTHETIC AORTIC HEART VALVE WITH ANATOMIC SEWING RING SIZE (N/A) MITRAL VALVE (MV) REPLACEMENT USING ON-X PROSTHETIC MITRAL HEART VALVE WITH STANDARD SEWING RING SIZE 31/33MM (N/A) TRANSESOPHAGEAL ECHOCARDIOGRAM (TEE) (N/A) POD # 2 NEURO- intact CV- in junctional rhythm in high 50s this AM, DDD at 60 with A rate and V paced at 90- not on beta blocker Hypertensive- resume home ARB and add Norvasc Warfarin for mechanical valves- INR 1.4 after 2.5 mg last night ID- PCN G for strep endocarditis RESP- IS for atelectasis RENAL- creatinine stable  Will try to diurese today ENDO- CBG ok GI- tolerating diet Anemia- stable, monitor Dc central line and Foley   LOS: 10 days    [name] 03/18/2023

## 2023-03-18 NOTE — Progress Notes (Signed)
   NAME:  Thomas [name], MRN:  [id number], DOB:  09-Aug-1960, LOS: 10 ADMISSION DATE:  03/08/2023, CONSULTATION DATE:  03/16/23  REFERRING MD:  Mammie Russian, CHIEF COMPLAINT:  Respiratory status change    History of Present Illness:  63 yo M PMH etoh use disorder, thrombocytopenia, s/p L nephrectomy, s/p R ulnar shortening 09/2022 was admitted to [facility] 1/9 after presenting w generalized malaise, SOB. Admitted for suspected decomp HF. Had an echo 1/10 which revealed severe AI and a density attached to the AV leaflet. Bcx obtained 1/11 -- resulted w strep infantarius.  ID consulted 1/12 for this, rec rocephin and TEE which occurred 1/13 and revealed severe AI w AV vegetation, Severe MR w MV vegetation possible perf and flail segment + Moderate AS. CVTS was consulted 1/14 in this setting. 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 Abx changed to pen G when bcx resulted pan sensitive   Underwent MV replacement, AV replacement, drainage of bilat pleural effusions 1/17 with CVTS.  Post op had some hives which improved w benadryl and solumedrol. Fairly vasodilated and on epi and NE.  Extubated. Following extubation had a change in resp status with poor cough/gag, decr effort.  PCCM consulted in this setting   Pertinent  Medical History  Etoh use HTN L nephrectomy CHF thrombocytopenia  Significant Hospital Events: Including procedures, antibiotic start and stop dates in addition to other pertinent events   1/9 admit to [facility].  1/10 abnormal echo c/f AV vegetation 1/11 bcx w strep 1/12 ID consult abx to rocephin  1/13 TEE -- AV and MV endocarditis, possible MV perf. Moderate AS.   1/14 CVTS consult. Cards consult for cath -- single vessel dz of proximal and mid LAD 1/15 abx changed to Pen G -- pan sensitive strep 1/17 AV replacement MV replacement pleural effusion drainage. PCCM consulted post extubation  Interim History / Subjective:   Doing well R arm bugging him a bit Pain under control Minimal O2 need  Objective   Blood pressure 124/61, pulse 81, temperature 98.6 F (37 C), temperature source Oral, resp. rate (!) 23, height 5\' 11"  (1.803 m), weight 80.3 kg, SpO2 99%.        Intake/Output Summary (Last 24 hours) at 03/18/2023 1240 Last data filed at 03/18/2023 1000 Gross per 24 hour  Intake 1702.45 ml  Output 925 ml  Net 777.45 ml   Filed Weights   03/16/23 0420 03/17/23 0500 03/18/23 0500  Weight: 76.5 kg 80.9 kg 80.3 kg    Examination: No distress Moves to command R arm casted Sternotomy looks good Ext minimal edema Aox3 good insight  BMP ok CBC improved plts CXR still wet   Resolved Hospital Problem list   Encephalopathy, post op - resolved Postop vent management- resolved, lingering atelectasis and fluid should improve with time Vasoplegia   Hives postop, improved   Assessment & Plan:    Severe AI s/p AVR  Severe mitral regurg s/p MVR  AV and MV Strep infantarius Endocarditis  AoC diastolic HF  EtOH [place] disorder  Pacer dependence postop  - Diuresis - IS, wean O2 - EP eval - Multimodal pain control - Thiamine - Warfarin, drain removal per TCTS - Await PT/OT input - Prolonged abx planned: will discuss PICC etc with ID tomorrow - Maybe home in a day or two depending on trajectory  Myrla Halsted MD PCCM

## 2023-03-18 NOTE — Progress Notes (Signed)
   03/18/23 2002  BiPAP/CPAP/SIPAP  BiPAP/CPAP/SIPAP Pt Type Adult  Reason BIPAP/CPAP not in use Non-compliant   Pt has PRN order, no distress at this time.

## 2023-03-18 NOTE — Plan of Care (Signed)

## 2023-03-18 NOTE — Progress Notes (Signed)
      [address]       [place] [address]             [phone]       Some discomfort right arm from pin under cast  BP (!) 105/56   Pulse 69   Temp 97.8 F (36.6 C) (Oral)   Resp 17   Ht 5\' 11"  (1.803 m)   Wt 80.3 kg   SpO2 94%   BMI 24.69 kg/m   Intake/Output Summary (Last 24 hours) at 03/18/2023 1725 Last data filed at 03/18/2023 1600 Gross per 24 hour  Intake 1086.66 ml  Output 1240 ml  Net -153.34 ml   Doing well  Viviann Spare C. Dorris Fetch, MD [facility] [phone]

## 2023-03-19 ENCOUNTER — Other Ambulatory Visit: Payer: Self-pay

## 2023-03-19 ENCOUNTER — Encounter (HOSPITAL_COMMUNITY): Payer: Self-pay | Admitting: Thoracic Surgery (Cardiothoracic Vascular Surgery)

## 2023-03-19 ENCOUNTER — Inpatient Hospital Stay (HOSPITAL_COMMUNITY): Payer: 59

## 2023-03-19 DIAGNOSIS — I5033 Acute on chronic diastolic (congestive) heart failure: Secondary | ICD-10-CM | POA: Diagnosis not present

## 2023-03-19 DIAGNOSIS — I330 Acute and subacute infective endocarditis: Secondary | ICD-10-CM | POA: Diagnosis not present

## 2023-03-19 LAB — CBC
HCT: 23.5 % — ABNORMAL LOW (ref 39.0–52.0)
Hemoglobin: 7.4 g/dL — ABNORMAL LOW (ref 13.0–17.0)
MCH: 28.2 pg (ref 26.0–34.0)
MCHC: 31.5 g/dL (ref 30.0–36.0)
MCV: 89.7 fL (ref 80.0–100.0)
Platelets: 140 10*3/uL — ABNORMAL LOW (ref 150–400)
RBC: 2.62 MIL/uL — ABNORMAL LOW (ref 4.22–5.81)
RDW: 17.7 % — ABNORMAL HIGH (ref 11.5–15.5)
WBC: 8.9 10*3/uL (ref 4.0–10.5)
nRBC: 0 % (ref 0.0–0.2)

## 2023-03-19 LAB — TYPE AND SCREEN
ABO/RH(D): O NEG
Antibody Screen: NEGATIVE
Unit division: 0
Unit division: 0
Unit division: 0
Unit division: 0
Unit division: 0
Unit division: 0
Unit division: 0
Unit division: 0

## 2023-03-19 LAB — BPAM RBC
Blood Product Expiration Date: 202502142359
Blood Product Expiration Date: 202502032359
Blood Product Expiration Date: 202502032359
ISSUE DATE / TIME: 202501130822
ISSUE DATE / TIME: 202501131140
ISSUE DATE / TIME: 202501170841
ISSUE DATE / TIME: 202501170841
ISSUE DATE / TIME: 202501171318
ISSUE DATE / TIME: 202501172302
ISSUE DATE / TIME: 202501190634
ISSUE DATE / TIME: 202501190827
ISSUE DATE / TIME: 202501232359
ISSUE DATE / TIME: 202501232359
ISSUE DATE / TIME: 202502032359
ISSUE DATE / TIME: 202502032359
ISSUE DATE / TIME: 202502142359
Unit Type and Rh: 202501232359
Unit Type and Rh: 202501232359
Unit Type and Rh: 202502032359
Unit Type and Rh: 202502032359
Unit Type and Rh: 202502142359
Unit Type and Rh: 202502142359
Unit Type and Rh: 5100
Unit Type and Rh: 5100
Unit Type and Rh: 5100
Unit Type and Rh: 5100
Unit Type and Rh: 9500
Unit Type and Rh: 9500
Unit Type and Rh: 9500
Unit Type and Rh: 9500

## 2023-03-19 LAB — BASIC METABOLIC PANEL
Anion gap: 10 (ref 5–15)
BUN: 31 mg/dL — ABNORMAL HIGH (ref 8–23)
CO2: 20 mmol/L — ABNORMAL LOW (ref 22–32)
Calcium: 8.2 mg/dL — ABNORMAL LOW (ref 8.9–10.3)
Chloride: 101 mmol/L (ref 98–111)
Creatinine, Ser: 0.87 mg/dL (ref 0.61–1.24)
GFR, Estimated: >60 mL/min (ref >60)
Glucose, Bld: 88 mg/dL (ref 70–99)
Potassium: 5.1 mmol/L (ref 3.5–5.1)
Sodium: 131 mmol/L — ABNORMAL LOW (ref 135–145)

## 2023-03-19 LAB — GLUCOSE, CAPILLARY
Glucose-Capillary: 71 mg/dL (ref 70–99)
Glucose-Capillary: 91 mg/dL (ref 70–99)
Glucose-Capillary: 91 mg/dL (ref 70–99)

## 2023-03-19 LAB — PROTIME-INR
INR: 1.7 — ABNORMAL HIGH (ref 0.8–1.2)
Prothrombin Time: 20.5 s — ABNORMAL HIGH (ref 11.4–15.2)

## 2023-03-19 LAB — HEPARIN LEVEL (UNFRACTIONATED): Heparin Unfractionated: <0.1 [IU]/mL — ABNORMAL LOW (ref 0.30–0.70)

## 2023-03-19 LAB — ACID FAST SMEAR (AFB, MYCOBACTERIA)
Acid Fast Smear: NEGATIVE
Acid Fast Smear: NEGATIVE

## 2023-03-19 LAB — SURGICAL PATHOLOGY

## 2023-03-19 MED ORDER — SODIUM CHLORIDE 0.9% FLUSH
10.0000 mL | Freq: Two times a day (BID) | INTRAVENOUS | Status: DC
  Administered 2023-03-19 – 2023-03-21 (×5): 10 mL
  Administered 2023-03-22: 20 mL
  Administered 2023-03-24 – 2023-03-26 (×5): 10 mL

## 2023-03-19 MED ORDER — SODIUM CHLORIDE 0.9% FLUSH
10.0000 mL | INTRAVENOUS | Status: DC | PRN
  Administered 2023-03-26: 20 mL

## 2023-03-19 MED ORDER — SENNA 8.6 MG PO TABS
2.0000 | ORAL_TABLET | Freq: Every day | ORAL | Status: DC
  Administered 2023-03-19 – 2023-03-25 (×6): 17.2 mg via ORAL
  Filled 2023-03-19 (×6): qty 2

## 2023-03-19 MED ORDER — FUROSEMIDE 40 MG PO TABS
40.0000 mg | ORAL_TABLET | Freq: Two times a day (BID) | ORAL | Status: DC
Start: 2023-03-19 — End: 2023-03-22
  Administered 2023-03-19 – 2023-03-22 (×7): 40 mg via ORAL
  Filled 2023-03-19 (×7): qty 1

## 2023-03-19 MED ORDER — WARFARIN SODIUM 2.5 MG PO TABS
2.5000 mg | ORAL_TABLET | Freq: Once | ORAL | Status: AC
  Administered 2023-03-19: 2.5 mg via ORAL
  Filled 2023-03-19: qty 1

## 2023-03-19 MED ORDER — HEPARIN (PORCINE) 25000 UT/250ML-% IV SOLN
1100.0000 [IU]/h | INTRAVENOUS | Status: DC
  Administered 2023-03-19: 800 [IU]/h via INTRAVENOUS
  Filled 2023-03-19: qty 250

## 2023-03-19 MED FILL — Heparin Sodium (Porcine) Inj 1000 Unit/ML: Qty: 1000 | Status: AC

## 2023-03-19 MED FILL — Calcium Chloride Inj 10%: INTRAVENOUS | Qty: 10 | Status: AC

## 2023-03-19 MED FILL — Lidocaine HCl Local Preservative Free (PF) Inj 2%: INTRAMUSCULAR | Qty: 14 | Status: AC

## 2023-03-19 MED FILL — Electrolyte-R (PH 7.4) Solution: INTRAVENOUS | Qty: 3000 | Status: AC

## 2023-03-19 MED FILL — Sodium Bicarbonate IV Soln 8.4%: INTRAVENOUS | Qty: 200 | Status: AC

## 2023-03-19 MED FILL — Potassium Chloride Inj 2 mEq/ML: INTRAVENOUS | Qty: 20 | Status: AC

## 2023-03-19 MED FILL — Mannitol IV Soln 20%: INTRAVENOUS | Qty: 500 | Status: AC

## 2023-03-19 MED FILL — Heparin Sodium (Porcine) Inj 1000 Unit/ML: INTRAMUSCULAR | Qty: 30 | Status: AC

## 2023-03-19 MED FILL — Heparin Sodium (Porcine) Inj 1000 Unit/ML: INTRAMUSCULAR | Qty: 20 | Status: AC

## 2023-03-19 MED FILL — Heparin Sodium (Porcine) Inj 1000 Unit/ML: INTRAMUSCULAR | Qty: 2500 | Status: AC

## 2023-03-19 NOTE — Progress Notes (Signed)
3 Days Post-Op Procedure(s) (LRB): AORTIC VALVE REPLACEMENT (AVR) USING ON-X PROSTHETIC AORTIC HEART VALVE WITH ANATOMIC SEWING RING SIZE (N/A) MITRAL VALVE (MV) REPLACEMENT USING ON-X PROSTHETIC MITRAL HEART VALVE WITH STANDARD SEWING RING SIZE 31/33MM (N/A) TRANSESOPHAGEAL ECHOCARDIOGRAM (TEE) (N/A) Subjective: Up in chair Pain well controlled  Objective: Vital signs in last 24 hours: Temp:  [97.8 F (36.6 C)-98.6 F (37 C)] 98.5 F (36.9 C) (01/20 0600) Pulse Rate:  [69-98] 85 (01/20 0700) Cardiac Rhythm: Ventricular paced (01/19 2000) Resp:  [10-62] 19 (01/20 0700) BP: (77-163)/(46-88) 153/73 (01/20 0630) SpO2:  [90 %-100 %] 91 % (01/20 0700) Weight:  [84.1 kg] 84.1 kg (01/20 0500)  Hemodynamic parameters for last 24 hours:    Intake/Output from previous day: 01/19 0701 - 01/20 0700 In: 1730.5 [P.O.:720; IV Piggyback:1010.5] Out: 1460 [Urine:1460] Intake/Output this shift: No intake/output data recorded.  General appearance: alert, cooperative, and no distress Neurologic: intact Heart: regular rate and rhythm Wound: clean and dry  Lab Results: Recent Labs    03/18/23 0450 03/19/23 0624  WBC 13.2* 8.9  HGB 7.6* 7.4*  HCT 23.9* 23.5*  PLT 122* 140*   BMET:  Recent Labs    03/18/23 0450 03/19/23 0624  NA 131* 131*  K 5.2* 5.1  CL 102 101  CO2 21* 20*  GLUCOSE 114* 88  BUN 33* 31*  CREATININE 1.27* 0.87  CALCIUM 8.5* 8.2*    PT/INR:  Recent Labs    03/19/23 0624  LABPROT 20.5*  INR 1.7*   ABG    Component Value Date/Time   PHART 7.344 (L) 03/17/2023 0107   HCO3 22.3 03/17/2023 0107   TCO2 24 03/17/2023 0107   ACIDBASEDEF 3.0 (H) 03/17/2023 0107   O2SAT 97 03/17/2023 0107   CBG (last 3)  Recent Labs    03/18/23 1606 03/18/23 2220 03/19/23 0621  GLUCAP 105* 89 71    Assessment/Plan: S/P Procedure(s) (LRB): AORTIC VALVE REPLACEMENT (AVR) USING ON-X PROSTHETIC AORTIC HEART VALVE WITH ANATOMIC SEWING RING SIZE (N/A) MITRAL  VALVE (MV) REPLACEMENT USING ON-X PROSTHETIC MITRAL HEART VALVE WITH STANDARD SEWING RING SIZE 31/33MM (N/A) TRANSESOPHAGEAL ECHOCARDIOGRAM (TEE) (N/A) POD # 3 NEURO- intact CV- in junctional vs sinus with prolonged PR at ~ 70 bpm  Warfarin for mechanical valves- INR 1.7  Keep pacing wires for now ID- needs long term IV antibiotics  On PCN G for strep RESP-  continue IS RENAL- creatinine normal  Weight up 8 lb from yesterday- doubt accuracy  Diurese with PO Lasix ENDO- CBG normal  Dc SSI GI- tolerating diet Cardiac rehab  LOS: 11 days    [name] 03/19/2023

## 2023-03-19 NOTE — Progress Notes (Cosign Needed Addendum)
  Patient Name: Thomas [name] [facility] Date of Encounter: 03/19/2023  Primary Cardiologist: None Electrophysiologist: new to Dr. Nelly Laurence  Interval Summary   The patient is doing well today. Sitting up in chair, already walked around unit this AM. No acute complaints   Vital Signs    Vitals:   03/19/23 0530 03/19/23 0600 03/19/23 0630 03/19/23 0700  BP: 112/76 (!) 163/72 (!) 153/73   Pulse: 87 93 91 85  Resp: (!) 24 15 (!) 23 19  Temp:  98.5 F (36.9 C)    TempSrc:  Oral    SpO2: 100% 91% 96% 91%  Weight:      Height:        Intake/Output Summary (Last 24 hours) at 03/19/2023 0751 Last data filed at 03/19/2023 0700 Gross per 24 hour  Intake 1720.1 ml  Output 1460 ml  Net 260.1 ml   Filed Weights   03/17/23 0500 03/18/23 0500 03/19/23 0500  Weight: 80.9 kg 80.3 kg 84.1 kg    Physical Exam    GEN- The patient is well appearing, alert and oriented x 3 today.   Lungs- Clear to ausculation bilaterally, normal work of breathing Cardiac- Regular rate and rhythm, no murmurs, rubs or gallops GI- soft, NT, ND, + BS Extremities- no clubbing or cyanosis. No edema  Telemetry    Rates 68-70 11bt NSVT yesterday AM (personally reviewed)   EKG    1/20 at 747 - aflutter with LBBB, rate 69bpm. Regularly R-R. Same LBBB pattern as post-op EKG; ?junctional escape  1/18 - SR with 1st deg HB, LBBB; new postop LBBB PR  1/9 - ST, normal intervals. Narrow QRS   Hospital Course    Thomas [name] is a 63 y.o. male a history of severe aortic and mitral valve regurgitation, s/p replacement 1/17; left nephrectomy, remote substance abuse, thrombocytopenia. EP consulted for post-op heart block.  V Threshold 4mv, programmed at 10mv Sensitivity reduced to 1 secondary to observed undersensing with VP VVI 30 EKG ordered to eval rhythm   Assessment & Plan    #) heart block #) aflutter Post-operative after AVR, MVR surgery 1/18 w On-X valves Temp wires remain in place  V  Threshold 4mv, programmed at 10mv Sensitivity reduced to 1 secondary to observed undersensing with VP VVI 30 EKG ordered to eval rhythm shows 3:1 Aflutter with LBBB --  new LBBB since surgery ?junctional escape INR this AM 1.7 Will discuss anti-coag recs with MD  #) strep infantaris endocarditis S/p valve replacements as above Continue abx per ID - re-engage ID re: picc line and future abx needs in case needs PPM   Dr. Nelly Laurence to see       For questions or updates, please contact [facility] Please consult [email or web address] for contact info under Cardiology/STEMI.  Signed, Sherie Don, NP  03/19/2023, 7:51 AM

## 2023-03-19 NOTE — Progress Notes (Signed)
03/19/2023 Given stability, will be available PRN.  Myrla Halsted MD PCCM

## 2023-03-19 NOTE — Progress Notes (Signed)
[facility]   Reason for visit: Follow up on infective endocarditis  Interval History: He remains in 2H ICU, feeling well with no complaints.  WBC normal at 8.9.  He remains afebrile.  He is status post dual valve replacement.  Cultures from valves remain negative to date. No associated rash or diarrhea.  Physical Exam: Constitutional:  Vitals:   03/19/23 1000 03/19/23 1100  BP:  119/62  Pulse: 65 63  Resp: 15 (!) 23  Temp:    SpO2: 95% 94%   patient appears in NAD Respiratory: Normal respiratory effort  Review of Systems: Constitutional: negative for fevers and chills  Lab Results  Component Value Date   WBC 8.9 03/19/2023   HGB 7.4 (L) 03/19/2023   HCT 23.5 (L) 03/19/2023   MCV 89.7 03/19/2023   PLT 140 (L) 03/19/2023    Lab Results  Component Value Date   CREATININE 0.87 03/19/2023   BUN 31 (H) 03/19/2023   NA 131 (L) 03/19/2023   K 5.1 03/19/2023   CL 101 03/19/2023   CO2 20 (L) 03/19/2023    Lab Results  Component Value Date   ALT 34 03/08/2023   AST 79 (H) 03/08/2023   ALKPHOS 96 03/08/2023     Microbiology: Recent Results (from the past 240 hours)  Culture, blood (Routine X 2) w Reflex to ID Panel     Status: Abnormal   Collection Time: 03/10/23 10:57 AM   Specimen: BLOOD  Result Value Ref Range Status   Specimen Description   Final    BLOOD BLOOD LEFT FOREARM Performed at [facility], [address], [place], [place] [address]    Special Requests   Final    BOTTLES DRAWN AEROBIC AND ANAEROBIC Blood Culture adequate volume Performed at [facility], [address], [place], [place] [address]    Culture  Setup Time   Final    GRAM POSITIVE COCCI ANAEROBIC BOTTLE ONLY Gram Stain Report Called to,Read Back By and Verified With: N.DORMON ON  03/11/2023 @10 :19 BY T.HAMER CRITICAL RESULT CALLED TO, READ BACK BY AND VERIFIED WITH: PHARMD J LEFDFORD 03/12/2023 @ 0142 BY AB Performed at [facility], [address] [place], [place] [address]    Culture STREPTOCOCCUS INFANTARIUS (A)  Final   Report Status 03/14/2023 FINAL  Final   Organism ID, Bacteria STREPTOCOCCUS INFANTARIUS  Final      Susceptibility   Streptococcus infantarius - MIC*    ERYTHROMYCIN <=0.12 SENSITIVE Sensitive     TETRACYCLINE <=0.25 SENSITIVE Sensitive     VANCOMYCIN 0.25 SENSITIVE Sensitive     CLINDAMYCIN <=0.25 SENSITIVE Sensitive     PENICILLIN <=0.06      CEFTAZIDIME <=0.12      * STREPTOCOCCUS INFANTARIUS  Culture, blood (Routine X 2) w Reflex to ID Panel     Status: None   Collection Time: 03/10/23 10:57 AM   Specimen: BLOOD  Result Value Ref Range Status   Specimen Description BLOOD BLOOD LEFT HAND  Final   Special Requests   Final    BOTTLES DRAWN AEROBIC AND ANAEROBIC Blood Culture adequate volume   Culture   Final    NO GROWTH 5 DAYS Performed at [facility], [address], [place], [place] [address]    Report Status 03/15/2023 FINAL  Final  Blood Culture ID Panel (Reflexed)     Status: Abnormal   Collection Time: 03/10/23 10:57 AM  Result Value Ref Range Status   Enterococcus faecalis  NOT DETECTED NOT DETECTED Final   Enterococcus Faecium NOT DETECTED NOT DETECTED Final   Listeria monocytogenes NOT DETECTED NOT DETECTED Final   Staphylococcus species NOT DETECTED NOT DETECTED Final   Staphylococcus aureus (BCID) NOT DETECTED NOT DETECTED Final   Staphylococcus epidermidis NOT DETECTED NOT DETECTED Final   Staphylococcus lugdunensis NOT DETECTED NOT DETECTED Final   Streptococcus species DETECTED (A) NOT DETECTED Final    Comment: Not Enterococcus species, Streptococcus agalactiae, Streptococcus pyogenes, or Streptococcus pneumoniae. CRITICAL RESULT CALLED TO, READ BACK BY AND VERIFIED WITH: PHARMD J LEFDFORD 03/12/2023 @ 0142 BY AB    Streptococcus agalactiae NOT DETECTED NOT DETECTED Final   Streptococcus pneumoniae NOT DETECTED NOT DETECTED Final   Streptococcus pyogenes NOT DETECTED NOT DETECTED  Final   A.calcoaceticus-baumannii NOT DETECTED NOT DETECTED Final   Bacteroides fragilis NOT DETECTED NOT DETECTED Final   Enterobacterales NOT DETECTED NOT DETECTED Final   Enterobacter cloacae complex NOT DETECTED NOT DETECTED Final   Escherichia coli NOT DETECTED NOT DETECTED Final   Klebsiella aerogenes NOT DETECTED NOT DETECTED Final   Klebsiella oxytoca NOT DETECTED NOT DETECTED Final   Klebsiella pneumoniae NOT DETECTED NOT DETECTED Final   Proteus species NOT DETECTED NOT DETECTED Final   Salmonella species NOT DETECTED NOT DETECTED Final   Serratia marcescens NOT DETECTED NOT DETECTED Final   Haemophilus influenzae NOT DETECTED NOT DETECTED Final   Neisseria meningitidis NOT DETECTED NOT DETECTED Final   Pseudomonas aeruginosa NOT DETECTED NOT DETECTED Final   Stenotrophomonas maltophilia NOT DETECTED NOT DETECTED Final   Candida albicans NOT DETECTED NOT DETECTED Final   Candida auris NOT DETECTED NOT DETECTED Final   Candida glabrata NOT DETECTED NOT DETECTED Final   Candida krusei NOT DETECTED NOT DETECTED Final   Candida parapsilosis NOT DETECTED NOT DETECTED Final   Candida tropicalis NOT DETECTED NOT DETECTED Final   Cryptococcus neoformans/gattii NOT DETECTED NOT DETECTED Final    Comment: Performed at [facility], [address], [place], [place] [address]  Culture, blood (Routine X 2) w Reflex to ID Panel     Status: None   Collection Time: 03/11/23  8:53 PM   Specimen: BLOOD  Result Value Ref Range Status   Specimen Description BLOOD BLOOD LEFT HAND  Final   Special Requests   Final    BOTTLES DRAWN AEROBIC AND ANAEROBIC Blood Culture results may not be optimal due to an inadequate volume of blood received in culture bottles   Culture   Final    NO GROWTH 5 DAYS Performed at [facility], [address] [place], [place] [address]    Report Status 03/16/2023 FINAL  Final  Culture, blood (Routine X 2) w Reflex to ID Panel     Status: None    Collection Time: 03/11/23  8:53 PM   Specimen: BLOOD  Result Value Ref Range Status   Specimen Description BLOOD BLOOD LEFT ARM  Final   Special Requests   Final    BOTTLES DRAWN AEROBIC AND ANAEROBIC Blood Culture results may not be optimal due to an inadequate volume of blood received in culture bottles   Culture   Final    NO GROWTH 5 DAYS Performed at [facility], [address], [place], [place] [address]    Report Status 03/16/2023 FINAL  Final  Surgical pcr screen     Status: None   Collection Time: 03/16/23  4:59 AM   Specimen: Nasal Mucosa; Nasal Swab  Result Value Ref  Range Status   MRSA, PCR NEGATIVE NEGATIVE Final   Staphylococcus aureus NEGATIVE NEGATIVE Final    Comment: (NOTE) The Xpert SA Assay (FDA approved for NASAL specimens in patients 56 years of age and older), is one component of a comprehensive surveillance program. It is not intended to diagnose infection nor to guide or monitor treatment. Performed at [facility], [address], [place], [place] [address]   Aerobic/Anaerobic Culture w Gram Stain (surgical/deep wound)     Status: None (Preliminary result)   Collection Time: 03/16/23  9:24 AM   Specimen: Mitral Valve Leaflets; Tissue  Result Value Ref Range Status   Specimen Description VALVE  Final   Special Requests MITRAL  Final   Gram Stain   Final    RARE WBC PRESENT,BOTH PMN AND MONONUCLEAR NO ORGANISMS SEEN    Culture   Final    NO GROWTH 3 DAYS NO ANAEROBES ISOLATED; CULTURE IN PROGRESS FOR 5 DAYS Performed at [facility], [address] [place], [place] [address]    Report Status PENDING  Incomplete  Aerobic/Anaerobic Culture w Gram Stain (surgical/deep wound)     Status: None (Preliminary result)   Collection Time: 03/16/23 10:37 AM   Specimen: Path Tissue  Result Value Ref Range Status   Specimen Description VALVE  Final   Special Requests AORTIC  Final   Gram Stain   Final    RARE WBC PRESENT,BOTH PMN AND  MONONUCLEAR NO ORGANISMS SEEN    Culture   Final    NO GROWTH 3 DAYS NO ANAEROBES ISOLATED; CULTURE IN PROGRESS FOR 5 DAYS Performed at [facility], [address], [place], [place] [address]    Report Status PENDING  Incomplete    Impression/Plan:  1.  Infective endocarditis.  He had an aortic valve vegetation with severe aortic regurgitation and additional mitral valve vegetation with severe eccentric regurgitation consistent with endocarditis.  He is now status post dual valve replacement.  Valve cultures to date remain negative.  He does have a strep Infantarius and blood culture and on penicillin.  No issues with rash or diarrhea. Plan will be to continue with penicillin continuous infusion for 4 weeks post surgery.  2.  Access -he now has a PICC line in place.  This will be available for long-term antibiotics.  Blood cultures from January 12 have remained negative and now final.  As above, will plan on 4 weeks.  3.  Heart block and atrial flutter.  He has temporary pacer in place and doing well with this.  Concern is for the need for a permanent pacemaker.   I will repeat the blood cultures today just to be sure no circulating bacteria but at this point, if a permanent pacemaker is indicated, from an infectious disease standpoint this could be placed when needed.  Ideally, will wait as long as possible.

## 2023-03-19 NOTE — Progress Notes (Signed)
PHARMACY - ANTICOAGULATION CONSULT NOTE  Pharmacy Consult for warfarin + add IV heparin Indication: mechanical AVR and MVR  Allergies  Allergen Reactions   Hydrocodone Rash    Patient Measurements: Height: 5\' 11"  (180.3 cm) Weight: 84.1 kg (185 lb 6.5 oz) IBW/kg (Calculated) : 75.3 Heparin dosing weight: 78.6 kg   Vital Signs: Temp: 97.9 F (36.6 C) (01/20 1940) Temp Source: Oral (01/20 1940) BP: 107/53 (01/20 1900) Pulse Rate: 58 (01/20 1900)  Labs: Recent Labs    03/17/23 1600 03/17/23 2233 03/18/23 0450 03/19/23 0624 03/19/23 1939  HGB 7.4*  --  7.6* 7.4*  --   HCT 24.0*  --  23.9* 23.5*  --   PLT 109*  --  122* 140*  --   LABPROT  --   --  17.1* 20.5*  --   INR  --   --  1.4* 1.7*  --   HEPARINUNFRC  --   --   --   --  <0.10*  CREATININE 1.25* 1.31* 1.27* 0.87  --     Estimated Creatinine Clearance: 93.8 mL/min (by C-G formula based on SCr of 0.87 mg/dL).   Medical History: Past Medical History:  Diagnosis Date   Blood dyscrasia    GERD (gastroesophageal reflux disease)    History of kidney stones    Hypertension    Thrombocytopenia (HCC) 04/12/2016    Medications:  Medications Prior to Admission  Medication Sig Dispense Refill Last Dose/Taking   acetaminophen (TYLENOL) 650 MG CR tablet Take 1,300 mg by mouth every 8 (eight) hours as needed for pain.   Past Week   ALPRAZolam (XANAX) 1 MG tablet Take 1 mg by mouth at bedtime as needed for anxiety.   Past Week   ibuprofen (ADVIL) 800 MG tablet Take 1 tablet (800 mg total) by mouth 3 (three) times daily. Take with food (Patient taking differently: Take 200 mg by mouth every 8 (eight) hours as needed for fever, headache or mild pain (pain score 1-3). Take with food) 60 tablet 0 Taking Differently   olmesartan (BENICAR) 40 MG tablet Take 40 mg by mouth daily.   Past Week   omeprazole (PRILOSEC OTC) 20 MG tablet Take 20 mg by mouth daily.   03/07/2023   oxyCODONE (ROXICODONE) 5 MG immediate release tablet  Take 1 tablet (5 mg total) by mouth every 6 (six) hours as needed. (Patient not taking: Reported on 03/08/2023) 30 tablet 0 Not Taking   DULoxetine (CYMBALTA) 60 MG capsule Take 60 mg by mouth daily. (Patient not taking: Reported on 03/08/2023)   Not Taking   Scheduled:   acetaminophen  1,000 mg Oral Q6H   Or   acetaminophen (TYLENOL) oral liquid 160 mg/5 mL  1,000 mg Per Tube Q6H   amLODipine  10 mg Oral Daily   atorvastatin  40 mg Oral Daily   bisacodyl  10 mg Oral Daily   Or   bisacodyl  10 mg Rectal Daily   Chlorhexidine Gluconate Cloth  6 each Topical Daily   docusate sodium  200 mg Oral BID   folic acid  1 mg Oral Daily   furosemide  40 mg Oral BID   irbesartan  150 mg Oral Daily   mupirocin ointment  1 Application Nasal BID   pantoprazole  40 mg Oral Daily   polyethylene glycol  17 g Oral Daily   senna  2 tablet Oral QHS   sodium chloride flush  10-40 mL Intracatheter Q12H   sodium chloride flush  10-40 mL Intracatheter Q12H   sodium chloride flush  3 mL Intravenous Q12H   thiamine  100 mg Oral Daily   Or   thiamine  100 mg Intravenous Daily   Warfarin - Pharmacist Dosing Inpatient   Does not apply q1600    Assessment: 63 yo male with severe MR and AVR 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 Pharmacy consulted to dose warfarin. No anticoagulants noted PTA.  INR is subtherapeutic but increasing at 1.7 (+0.3) after warfarin 5 mg x1. CBC stable and no signs of bleeding.  Pharmacy asked by EP to begin IV heparin today for aflutter until INR therapeutic.  Goal of Therapy:  INR goal 2.5-3.5 (discuss with Dr. Dorris Fetch) Monitor platelets by anticoagulation protocol: Yes   Plan:  -Start IV heparin gtt without bolus at 800 units/hr -Check heparin level in 6 hrs. -Warfarin 2.5 mg po today -Monitor daily PT/INR, CBC, and signs of bleeding   1/20 PM update: Initial ~6h heparin level undetectable on 800 units/hr, no bolus.  No bleeding or infusion issues noted.  Increase to 950 units/hr,  obtain 6h anti-Xa level.    Trixie Rude, PharmD Clinical Pharmacist 03/19/2023  8:16 PM

## 2023-03-19 NOTE — Evaluation (Signed)
Occupational Therapy Evaluation Patient Details Name: Thomas [name] MRN: [id number] DOB: [birth year] Today's Date: 03/19/2023   History of Present Illness 63 y.o. male admitted 03/08/23 with malaise, SOB. Workup for decompensated HF. TEE 1/13 showed severe AI with AV vegetation, severe MR with MV vegetation. S/p MVR, AVR, bilateral pleural effusion drainage 1/17. ETT -1/17. PMH includes HTN, s/p L nephrectomy, R wrist fx (s/p multiple sxs, most recently 01/2023; to be RUE NWB in sling when OOB), remote substance use.   Clinical Impression   PTA, pt lived with significant other and was mod I for ADL and IADL. Upon eval, pt with poor recall of sternal precautions and needing min-mod cues for implementation during mobility and ADL. Pt needing up to CGA for ADL OOB. Will follow for 1+ more sessions to optimize education regarding implementation of sternal precautions.       If plan is discharge home, recommend the following: A little help with walking and/or transfers;A little help with bathing/dressing/bathroom;Assistance with cooking/housework;Help with stairs or ramp for entrance;Assist for transportation    Functional Status Assessment  Patient has had a recent decline in their functional status and demonstrates the ability to make significant improvements in function in a reasonable and predictable amount of time.  Equipment Recommendations  None recommended by OT    Recommendations for Other Services       Precautions / Restrictions Precautions Precautions: Sternal Precaution Comments: pt and partner already have pre-op and sternal precautions handout Restrictions Weight Bearing Restrictions Per Provider Order: Yes (sternal precautions) Other Position/Activity Restrictions: recent R wrist sx in hard cast      Mobility Bed Mobility               General bed mobility comments: OOB in chair    Transfers Overall transfer level: Needs assistance Equipment used: Rolling  walker (2 wheels), None Transfers: Sit to/from Stand Sit to Stand: Supervision                  Balance Overall balance assessment: Needs assistance Sitting-balance support: No upper extremity supported, Feet supported Sitting balance-Leahy Scale: Good     Standing balance support: No upper extremity supported, During functional activity Standing balance-Leahy Scale: Good                             ADL either performed or assessed with clinical judgement   ADL Overall ADL's : Needs assistance/impaired Eating/Feeding: Independent   Grooming: Contact guard assist;Standing   Upper Body Bathing: Set up;Sitting   Lower Body Bathing: Sit to/from stand;Supervison/ safety   Upper Body Dressing : Set up;Sitting Upper Body Dressing Details (indicate cue type and reason): cues for sternal precautions Lower Body Dressing: Supervision/safety;Sit to/from stand Lower Body Dressing Details (indicate cue type and reason): cues for sternal precautions             Functional mobility during ADLs: Contact guard assist;Rolling walker (2 wheels)       Vision Baseline Vision/History: 1 Wears glasses Ability to See in Adequate Light: 0 Adequate Patient Visual Report: No change from baseline Vision Assessment?: No apparent visual deficits     Perception         Praxis         Pertinent Vitals/Pain Pain Assessment Pain Assessment: No/denies pain     Extremity/Trunk Assessment Upper Extremity Assessment Upper Extremity Assessment: Right hand dominant;RUE deficits/detail RUE Deficits / Details: R forearm/wrist cast s/p recent R ulnar  shaft repair and pinning distal radial ulnar joint   Lower Extremity Assessment Lower Extremity Assessment: Defer to PT evaluation       Communication Communication Communication: No apparent difficulties   Cognition Arousal: Alert Behavior During Therapy: WFL for tasks assessed/performed Overall Cognitive Status:  Impaired/Different from baseline Area of Impairment: Memory                     Memory: Decreased recall of precautions               General Comments  Pt unaware of sternal precautions on arrival and needing min-mod cues to maintain during session with significant tendency to push with LUE when transferring and scooting forward or back in chair    Exercises     Shoulder Instructions      Home Living Family/patient expects to be discharged to:: Private residence Living Arrangements: Spouse/significant other Available Help at Discharge: Family;Available PRN/intermittently Type of Home: House Home Access: Stairs to enter [employer] of Steps: 4-5 Entrance Stairs-Rails: Right Home Layout: Multi-level;Bed/bath upstairs Alternate Level Stairs-Number of Steps: flight Alternate Level Stairs-Rails: Right Bathroom Shower/Tub: Tub/shower unit;Walk-in shower   Bathroom Toilet: Standard     Home Equipment: None          Prior Functioning/Environment Prior Level of Function : Independent/Modified Independent;Working/employed;Driving             Mobility Comments: independent without DME, 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), enjoys golfing ADLs Comments: independent        OT Problem List: Decreased strength;Decreased activity tolerance;Impaired balance (sitting and/or standing);Decreased safety awareness;Decreased knowledge of use of DME or AE;Cardiopulmonary status limiting activity;Decreased knowledge of precautions      OT Treatment/Interventions: Self-care/ADL training;Therapeutic exercise;Energy conservation;DME and/or AE instruction;Patient/family education;Balance training;Therapeutic activities    OT Goals(Current goals can be found in the care plan section) Acute Rehab OT Goals Patient Stated Goal: get better OT Goal Formulation: With patient Time For Goal Achievement: 04/02/23 Potential to Achieve Goals: Good  OT Frequency: Min  1X/week    Co-evaluation              AM-PAC OT "6 Clicks" Daily Activity     Outcome Measure Help from another person eating meals?: None Help from another person taking care of personal grooming?: A Little Help from another person toileting, which includes using toliet, bedpan, or urinal?: A Little Help from another person bathing (including washing, rinsing, drying)?: A Little Help from another person to put on and taking off regular upper body clothing?: A Little Help from another person to put on and taking off regular lower body clothing?: A Little 6 Click Score: 19   End of Session Equipment Utilized During Treatment: Gait belt Nurse Communication: Mobility status  Activity Tolerance: Patient tolerated treatment well Patient left: in chair;with call bell/phone within reach;with chair alarm set  OT Visit Diagnosis: Unsteadiness on feet (R26.81);Muscle weakness (generalized) (M62.81);History of falling (Z91.81)                Time: 1610-9604 OT Time Calculation (min): 19 min Charges:  OT General Charges $OT Visit: 1 Visit OT Evaluation $OT Eval Moderate Complexity: 1 Mod  Tyler Deis, OTR/L [facility] Acute Rehabilitation Office: [phone]   Myrla Halsted 03/19/2023, 5:34 PM

## 2023-03-19 NOTE — Progress Notes (Signed)
Peripherally Inserted Central Catheter Placement  The IV Nurse has discussed with the patient and/or persons authorized to consent for the patient, the purpose of this procedure and the potential benefits and risks involved with this procedure.  The benefits include less needle sticks, lab draws from the catheter, and the patient may be discharged home with the catheter. Risks include, but not limited to, infection, bleeding, blood clot (thrombus formation), and puncture of an artery; nerve damage and irregular heartbeat and possibility to perform a PICC exchange if needed/ordered by physician.  Alternatives to this procedure were also discussed.  Bard Power PICC patient education guide, fact sheet on infection prevention and patient information card has been provided to patient /or left at bedside.    PICC Placement Documentation  PICC Single Lumen 03/19/23 Left Basilic 47 cm 0 cm (Active)  Indication for Insertion or Continuance of Line Prolonged intravenous therapies 03/19/23 0941  Exposed Catheter (cm) 0 cm 03/19/23 0941  Site Assessment Clean, Dry, Intact 03/19/23 0941  Line Status Flushed;Blood return noted;Saline locked 03/19/23 0941  Dressing Type Transparent 03/19/23 0941  Dressing Status Antimicrobial disc/dressing in place 03/19/23 0941  Line Care Connections checked and tightened 03/19/23 0941  Line Adjustment (NICU/IV Team Only) No 03/19/23 0941  Dressing Intervention New dressing 03/19/23 0941  Dressing Change Due 03/26/23 03/19/23 0941       Audrie Gallus 03/19/2023, 9:43 AM

## 2023-03-19 NOTE — Progress Notes (Signed)
PHARMACY - ANTICOAGULATION CONSULT NOTE  Pharmacy Consult for warfarin Indication: mechanical AVR and MVR  Allergies  Allergen Reactions   Hydrocodone Rash    Patient Measurements: Height: 5\' 11"  (180.3 cm) Weight: 84.1 kg (185 lb 6.5 oz) IBW/kg (Calculated) : 75.3   Vital Signs: Temp: 98.5 F (36.9 C) (01/20 0600) Temp Source: Oral (01/20 0600) BP: 143/62 (01/20 0800) Pulse Rate: 65 (01/20 0900)  Labs: Recent Labs    03/16/23 1306 03/16/23 1330 03/17/23 1600 03/17/23 2233 03/18/23 0450 03/19/23 0624  HGB 8.0*   < > 7.4*  --  7.6* 7.4*  HCT 24.4*   < > 24.0*  --  23.9* 23.5*  PLT 107*   < > 109*  --  122* 140*  APTT 42*  --   --   --   --   --   LABPROT 20.1*  --   --   --  17.1* 20.5*  INR 1.7*  --   --   --  1.4* 1.7*  CREATININE  --    < > 1.25* 1.31* 1.27* 0.87   < > = values in this interval not displayed.    Estimated Creatinine Clearance: 93.8 mL/min (by C-G formula based on SCr of 0.87 mg/dL).   Medical History: Past Medical History:  Diagnosis Date   Blood dyscrasia    GERD (gastroesophageal reflux disease)    History of kidney stones    Hypertension    Thrombocytopenia (HCC) 04/12/2016    Medications:  Medications Prior to Admission  Medication Sig Dispense Refill Last Dose/Taking   acetaminophen (TYLENOL) 650 MG CR tablet Take 1,300 mg by mouth every 8 (eight) hours as needed for pain.   Past Week   ALPRAZolam (XANAX) 1 MG tablet Take 1 mg by mouth at bedtime as needed for anxiety.   Past Week   ibuprofen (ADVIL) 800 MG tablet Take 1 tablet (800 mg total) by mouth 3 (three) times daily. Take with food (Patient taking differently: Take 200 mg by mouth every 8 (eight) hours as needed for fever, headache or mild pain (pain score 1-3). Take with food) 60 tablet 0 Taking Differently   olmesartan (BENICAR) 40 MG tablet Take 40 mg by mouth daily.   Past Week   omeprazole (PRILOSEC OTC) 20 MG tablet Take 20 mg by mouth daily.   03/07/2023   oxyCODONE  (ROXICODONE) 5 MG immediate release tablet Take 1 tablet (5 mg total) by mouth every 6 (six) hours as needed. (Patient not taking: Reported on 03/08/2023) 30 tablet 0 Not Taking   DULoxetine (CYMBALTA) 60 MG capsule Take 60 mg by mouth daily. (Patient not taking: Reported on 03/08/2023)   Not Taking   Scheduled:   acetaminophen  1,000 mg Oral Q6H   Or   acetaminophen (TYLENOL) oral liquid 160 mg/5 mL  1,000 mg Per Tube Q6H   amLODipine  10 mg Oral Daily   atorvastatin  40 mg Oral Daily   bisacodyl  10 mg Oral Daily   Or   bisacodyl  10 mg Rectal Daily   Chlorhexidine Gluconate Cloth  6 each Topical Daily   docusate sodium  200 mg Oral BID   folic acid  1 mg Oral Daily   furosemide  40 mg Oral BID   irbesartan  150 mg Oral Daily   mupirocin ointment  1 Application Nasal BID   pantoprazole  40 mg Oral Daily   polyethylene glycol  17 g Oral Daily   senna  1  tablet Oral QHS   sodium chloride flush  10-40 mL Intracatheter Q12H   sodium chloride flush  3 mL Intravenous Q12H   thiamine  100 mg Oral Daily   Or   thiamine  100 mg Intravenous Daily   warfarin  2.5 mg Oral ONCE-1600   Warfarin - Pharmacist Dosing Inpatient   Does not apply q1600    Assessment: 63 yo male with severe MR and AVR 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 Pharmacy consulted to dose warfarin. No anticoagulants noted PTA  INR is subtherapeutic but increasing at 1.7 (+0.3) after warfarin 5 mg x1. CBC stable and no signs of bleeding.  Goal of Therapy:  INR goal 2.5-3.5 (discuss with Dr. Dorris Fetch) Monitor platelets by anticoagulation protocol: Yes   Plan:  -Warfarin 2.5 mg po today -Monitor daily PT/INR, CBC, and signs of bleeding  Wilmer Floor, PharmD PGY2 Cardiology Pharmacy Resident  03/19/2023 9:59 AM   [facility] pharmacy phone numbers are listed on [email or web address]

## 2023-03-19 NOTE — Progress Notes (Signed)
NAME:  Thomas [name], MRN:  [id number], DOB:  06/23/1960, LOS: 11 ADMISSION DATE:  03/08/2023, CONSULTATION DATE:  03/16/23  REFERRING MD:  Mammie Russian, CHIEF COMPLAINT:  Respiratory status change    History of Present Illness:  63 yo M PMH etoh use disorder, thrombocytopenia, s/p L nephrectomy, s/p R ulnar shortening 09/2022 was admitted to [facility] 1/9 after presenting w generalized malaise, SOB. Admitted for suspected decomp HF. Had an echo 1/10 which revealed severe AI and a density attached to the AV leaflet. Bcx obtained 1/11 -- resulted w strep infantarius.  ID consulted 1/12 for this, rec rocephin and TEE which occurred 1/13 and revealed severe AI w AV vegetation, Severe MR w MV vegetation possible perf and flail segment + Moderate AS. CVTS was consulted 1/14 in this setting. 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 Abx changed to pen G when bcx resulted pan sensitive   Underwent MV replacement, AV replacement, drainage of bilat pleural effusions 1/17 with CVTS.  Post op had some hives which improved w benadryl and solumedrol. Fairly vasodilated and on epi and NE.  Extubated. Following extubation had a change in resp status with poor cough/gag, decr effort.  PCCM consulted in this setting   Pertinent  Medical History  Etoh use HTN L nephrectomy CHF thrombocytopenia  Significant Hospital Events: Including procedures, antibiotic start and stop dates in addition to other pertinent events   1/9 admit to [facility].  1/10 abnormal echo c/f AV vegetation 1/11 bcx w strep 1/12 ID consult abx to rocephin  1/13 TEE -- AV and MV endocarditis, possible MV perf. Moderate AS.   1/14 CVTS consult. Cards consult for cath -- single vessel dz of proximal and mid LAD 1/15 abx changed to Pen G -- pan sensitive strep 1/17 AV replacement MV replacement pleural effusion drainage. PCCM consulted post extubation  Interim History / Subjective:   No events. On RA, feels fine R hand irritation better  Objective   Blood pressure 112/76, pulse 87, temperature 98.5 F (36.9 C), temperature source Oral, resp. rate (!) 24, height 5\' 11"  (1.803 m), weight 84.1 kg, SpO2 100%.        Intake/Output Summary (Last 24 hours) at 03/19/2023 0631 Last data filed at 03/19/2023 0500 Gross per 24 hour  Intake 1643.35 ml  Output 1510 ml  Net 133.35 ml   Filed Weights   03/17/23 0500 03/18/23 0500 03/19/23 0500  Weight: 80.9 kg 80.3 kg 84.1 kg    Examination: No distress EP currently  working with his pacemaker, there seems to be some intrinsic rhythm Ext warm Sternotomy looks okay +murmur Moves to command R arm casted  CXR still looks wet Labs pending   Resolved Hospital Problem list   Encephalopathy, post op - resolved Postop vent management- resolved, lingering atelectasis and fluid should improve with time Vasoplegia   Hives postop, improved   Assessment & Plan:    Severe AI s/p AVR  Severe mitral regurg s/p MVR  AV and MV Strep infantarius Endocarditis  AoC diastolic HF  EtOH [place] disorder  Pacer dependence postop R arm broken: Right ulna shaft nonunion repair with spanning plate fixation and Right distal radial ulnar joint stabilization and pinning   - Consider additional diuresis: defer to [employer] - Ongoing EP eval apprecaited - Multimodal pain control - Thiamine - Warfarin per TCTS - Await PT/OT input - Prolonged abx planned: will discuss PICC etc with ID today -  Will talk with ortho about taking off cast this week - Home once we sortout ID, PT, EP needs  Myrla Halsted MD PCCM

## 2023-03-19 NOTE — Progress Notes (Signed)
PHARMACY CONSULT NOTE FOR:  OUTPATIENT  PARENTERAL ANTIBIOTIC THERAPY (OPAT)  Indication: Endocarditis Regimen: IV PCN End date: 4 weeks  IV antibiotic discharge orders are pended. To discharging provider:  please sign these orders via discharge navigator,  Select New Orders & click on the button choice - Manage This Unsigned Work.     Thank you for allowing pharmacy to be a part of this patient's care.  Dalene Carrow 03/19/2023, 9:51 AM

## 2023-03-19 NOTE — Progress Notes (Signed)
PHARMACY - ANTICOAGULATION CONSULT NOTE  Pharmacy Consult for warfarin + add IV heparin Indication: mechanical AVR and MVR  Allergies  Allergen Reactions   Hydrocodone Rash    Patient Measurements: Height: 5\' 11"  (180.3 cm) Weight: 84.1 kg (185 lb 6.5 oz) IBW/kg (Calculated) : 75.3   Vital Signs: Temp: 98.4 F (36.9 C) (01/20 1210) Temp Source: Oral (01/20 1210) BP: 116/60 (01/20 1200) Pulse Rate: 63 (01/20 1200)  Labs: Recent Labs    03/16/23 1306 03/16/23 1330 03/17/23 1600 03/17/23 2233 03/18/23 0450 03/19/23 0624  HGB 8.0*   < > 7.4*  --  7.6* 7.4*  HCT 24.4*   < > 24.0*  --  23.9* 23.5*  PLT 107*   < > 109*  --  122* 140*  APTT 42*  --   --   --   --   --   LABPROT 20.1*  --   --   --  17.1* 20.5*  INR 1.7*  --   --   --  1.4* 1.7*  CREATININE  --    < > 1.25* 1.31* 1.27* 0.87   < > = values in this interval not displayed.    Estimated Creatinine Clearance: 93.8 mL/min (by C-G formula based on SCr of 0.87 mg/dL).   Medical History: Past Medical History:  Diagnosis Date   Blood dyscrasia    GERD (gastroesophageal reflux disease)    History of kidney stones    Hypertension    Thrombocytopenia (HCC) 04/12/2016    Medications:  Medications Prior to Admission  Medication Sig Dispense Refill Last Dose/Taking   acetaminophen (TYLENOL) 650 MG CR tablet Take 1,300 mg by mouth every 8 (eight) hours as needed for pain.   Past Week   ALPRAZolam (XANAX) 1 MG tablet Take 1 mg by mouth at bedtime as needed for anxiety.   Past Week   ibuprofen (ADVIL) 800 MG tablet Take 1 tablet (800 mg total) by mouth 3 (three) times daily. Take with food (Patient taking differently: Take 200 mg by mouth every 8 (eight) hours as needed for fever, headache or mild pain (pain score 1-3). Take with food) 60 tablet 0 Taking Differently   olmesartan (BENICAR) 40 MG tablet Take 40 mg by mouth daily.   Past Week   omeprazole (PRILOSEC OTC) 20 MG tablet Take 20 mg by mouth daily.    03/07/2023   oxyCODONE (ROXICODONE) 5 MG immediate release tablet Take 1 tablet (5 mg total) by mouth every 6 (six) hours as needed. (Patient not taking: Reported on 03/08/2023) 30 tablet 0 Not Taking   DULoxetine (CYMBALTA) 60 MG capsule Take 60 mg by mouth daily. (Patient not taking: Reported on 03/08/2023)   Not Taking   Scheduled:   acetaminophen  1,000 mg Oral Q6H   Or   acetaminophen (TYLENOL) oral liquid 160 mg/5 mL  1,000 mg Per Tube Q6H   amLODipine  10 mg Oral Daily   atorvastatin  40 mg Oral Daily   bisacodyl  10 mg Oral Daily   Or   bisacodyl  10 mg Rectal Daily   Chlorhexidine Gluconate Cloth  6 each Topical Daily   docusate sodium  200 mg Oral BID   folic acid  1 mg Oral Daily   furosemide  40 mg Oral BID   irbesartan  150 mg Oral Daily   mupirocin ointment  1 Application Nasal BID   pantoprazole  40 mg Oral Daily   polyethylene glycol  17 g Oral Daily  senna  2 tablet Oral QHS   sodium chloride flush  10-40 mL Intracatheter Q12H   sodium chloride flush  10-40 mL Intracatheter Q12H   sodium chloride flush  3 mL Intravenous Q12H   thiamine  100 mg Oral Daily   Or   thiamine  100 mg Intravenous Daily   warfarin  2.5 mg Oral ONCE-1600   Warfarin - Pharmacist Dosing Inpatient   Does not apply q1600    Assessment: 63 yo male with severe MR and AVR 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 Pharmacy consulted to dose warfarin. No anticoagulants noted PTA  INR is subtherapeutic but increasing at 1.7 (+0.3) after warfarin 5 mg x1. CBC stable and no signs of bleeding.  Pharmacy asked by EP to begin IV heparin today for aflutter until INR therapeutic.  Goal of Therapy:  INR goal 2.5-3.5 (discuss with Dr. Dorris Fetch) Monitor platelets by anticoagulation protocol: Yes   Plan:  -Start IV heparin gtt without bolus at 800 units/hr -Check heparin level in 6 hrs. -Warfarin 2.5 mg po today -Monitor daily PT/INR, CBC, and signs of bleeding  Reece Leader, Colon Flattery, BCCP Clinical  Pharmacist  03/19/2023 12:40 PM   [facility] pharmacy phone numbers are listed on [email or web address]

## 2023-03-19 NOTE — Discharge Summary (Cosign Needed)
[address]       [place] [address]             [phone]    Physician Discharge Summary  Patient ID: Thomas [name] MRN: [id number] DOB/AGE: 63-Oct-1962 63 y.o.  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   Thrombocytopenia (HCC) 04/12/2016   Discharge Diagnoses:  Patient Active Problem List   Diagnosis Date Noted   S/P AVR (aortic valve replacement) 03/16/2023   S/P MVR (mitral valve replacement) 03/16/2023   Encephalopathy acute 03/16/2023   Bacterial endocarditis 03/16/2023   Acute on chronic diastolic CHF (congestive heart failure) (HCC) 03/11/2023   Acute bacterial endocarditis 03/10/2023   Acute heart failure with preserved ejection fraction (HFpEF) (HCC) 03/10/2023   Acute CHF (HCC) 03/08/2023   Pancytopenia (HCC) 03/08/2023   Alcohol abuse 03/08/2023   Essential hypertension 03/08/2023   Closed fracture of shaft of right ulna with delayed healing 02/27/2023   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 He underwent right wrist ulnar shortening osteotomy by ortho on 8/27. He then underwent right wrist ulnar shortening osteotomy plate removal and bone biopsies with cultures of the distal ulna  on 12/13 due to hardware failure and osteolysis. Cultures came back with no growth at that time. 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 He presented to the ED on 1/09 with one week of worsening shortness of breath, fatigue and increasing lower extremity edema. In the ED he was afebrile and hemodynamically stable on RA. BNP was 211, Troponin I was 27 and platelet count was 146,000. CXR showed increased vascular markings and interstitial markings and chest CTA showed bilateral pleural effusions with diffuse interstitial and interlobular septal thickening consistent with congestive heart failure or pulmonary edema. TTE on 1/10 showed LVEF>75%, severe aortic valve regurgitation with a linear shaped echodensity measuring 2.0cmx0.35cm concerning for endocarditis and mild aortic stenosis. TEE on 1/13 noted an aortic valve vegetation measuring 0.6cmx0.8cm located in the commissure between the NCC/LCC, severe aortic regurgitation, likely leaflet perforation, moderate aortic stenosis, severe mitral regurgitation posteriorly directed, a vegetation of the A3 segment on the mitral valve with a flail A1/A2 segment with likely leaflet perforation. LVEF was estimated at 60-65% and there was mild (grade II) layered plaque involve the descending aorta. Blood cultures were taken and he was started on empiric Cefepime and Vancomycin. Blood cultures were positive for streptococcus, ID was consulted and recommended discontinuing Cefepime and starting Ceftriaxone.  Dr. Dorris Fetch reviewed the patient's diagnostic studies and determined he would benefit from surgical intervention. He reviewed  the treatment options as well as the risks and benefits of surgery. Mr. Thomas [name] was agreeable to surgery.  Hospital Course:  Mr. Thomas [name] was taken to the operating room on 03/16/2023.  He underwent Aortic valve replacement with a 21 mm Ony-x Mechanical Valve, Mitral Valve Replacement with a 31-33 mm mechanical valve, and drainage of bilateral pleural effusions.  He tolerated the procedure without difficulty and was taken to the SICU in stable condition.  Vital signs and hemodynamics remained stable.  He was weaned from the ventilator and extubated during the late afternoon on the day of surgery.  The monitoring lines were removed on the first postoperative day.  He was started on Coumadin coagulation for the mechanical valves.  He had expected acute blood loss anemia and thrombocytopenia noted on the first postoperative day any bleeding issues.  Chest tubes were removed on postop day 1.  He was mobilized routinely and diuresis was begun.  He was noted to be in a junctional rhythm with a rate in the high 50s on postop day 2.  Beta-blocker was not started.  He was mildly hypertensive so he was started back on his usual ARB and Norvasc.  The INR was monitored daily and Coumadin dosing adjusted accordingly.  He continues to have a junctional rhythm with prolonged PR interval.  EP consult was obtained and they followed patient closely.  However he started to show some recovery of conduction and it was felt pacemaker would not need to be placed. This was placed on 03/21/2023.  He remained on PCN G for strep species.  He will require prolonged course and PICC line was placed on 03/19/2023.  OR cultures showed no growth and infectious disease recommended 4 weeks of post operative antibiotics.  His pacing wires were removed without difficulty on 03/22/2023.  He was in NSR with a prolong PR interval.  He was felt stable for transfer to the progressive care unit on 03/22/2023.  He remains on coumadin for mechanical AVR  and MVR.  His current INR is 2.9 and he will be discharged home on 4.  He was noted to have a pleural effusion that was moderate in size on the left.  IR was consulted for thoracentesis with successful removal of 1200 cc fluid.  Follow up chest xray showed trace pleural effusions, no pneumothorax.  He does have an expected acute blood loss anemia and values stabilized although notably hemoglobin was borderline at 7.7.  Renal function remained within normal limits.  Incisions are healing well without evidence of infection.  He was tolerating diet and routine activities.  Overall, at the time of discharge she was felt to be quite stable.  All home health arrangements were in place.  Consults: cardiology, pulmonary/intensive care, ID, and dental  Significant Diagnostic Studies: cardiac graphics:   Echocardiogram:    IMPRESSIONS     1. Aortic valve vegetation noted 0.6 cm x 0.8 cm located in the  commissure between the NCC/LCC. There is severe aortic regurgitation that  is eccentric and likely leaflet perforation. There is holodiastolic flow  reversal in the descending aorta  consistent with severe AI. 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 Moderate stenosis, likely flow related in  setting of severe AI. The aortic valve is tricuspid. There is moderate  calcification of the aortic valve. There is  moderate thickening of the aortic valve. Aortic valve  regurgitation is  severe. Moderate aortic valve stenosis. Aortic regurgitation PHT measures  393 msec. Aortic valve area, by VTI measures 2.69 cm. Aortic valve mean  gradient measures 22.0 mmHg. Aortic  valve Vmax measures 2.99 m/s.   2. Severe eccentric mitral regurgitation that is posteriorly directed.  Vegetation noted on the A3 segment that measures 0.4 cm x 0.7 cm. There is  also a flail A1/A2 segment with likely leaflet perforation. Findings  consistent with endocarditis and  significant valvular destruction.  Systolic blunting in all 4 pulmonary  veins. The mitral valve is abnormal. Severe mitral valve regurgitation. No  evidence of mitral stenosis.   3. Left ventricular ejection fraction, by estimation, is 60 to 65%. The  left ventricle has normal function.   4. Right ventricular systolic function is normal. The right ventricular  size is normal.   5. Left atrial size was mildly dilated. No left atrial/left atrial  appendage thrombus was detected. The LAA emptying velocity was 54 cm/s.   6. There is mild (Grade II) layered plaque involving the descending  aorta.   7. Agitated saline contrast bubble study was positive with shunting  observed after >6 cardiac cycles suggestive of intrapulmonary shunting.   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   Treatments: surgery:   NAME: BELAL, SCALLON MEDICAL RECORD NO: [id number] ACCOUNT NO: [id number] DATE OF BIRTH: [birth year] FACILITY: [facility] LOCATION: [facility] PHYSICIAN: Salvatore Decent. 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   PROCEDURE:  Median sternotomy, extracorporeal circulation, mitral valve replacement using 31/33 Onyx valve, serial number [id number], aortic valve replacement using 21 mm Onyx valve, serial number [id number].   SURGEON:  Salvatore Decent. Dorris Fetch, MD   ASSISTANT:  Lowella Dandy, PA   Discharge Exam: Blood pressure (!) 146/88, pulse 94, temperature 98 F (36.7 C), temperature source Oral, resp. rate 15, height 5\' 11"  (1.803 m), weight 81.3 kg, SpO2 98%.   Cardiovascular: RRR Pulmonary: Slightly diminished bibasilar breath sounds Abdomen: Soft, non tender, bowel sounds present. Extremities: Trace bilateral lower  extremity edema. Wounds: Clean and dry.  No erythema or signs of infection.  Discharge Medications:  The patient has been discharged on:   1.Beta Blocker:  Yes [   ]                              No   [ n  ]                              If No, reason: Prolonged PR interval/postoperative bradycardia  2.Ace Inhibitor/ARB: Yes [ y  ]                                     No  [    ]                                     If No, reason:  3.Statin:   Yes Cove.Etienne   ]  No  [   ]                  If No, reason:  4.Ecasa:  Yes  [   ]                  No   [ n  ]                  If No, reason:on coumadin for valve  Patient had ACS upon admission:n  Plavix/P2Y12 inhibitor: Yes [   ]                                      No  [ n  ]     Discharge Instructions     [facility] pharmacist to adjust dose for Vancomycin, Aminoglycosides and other anti-infective therapies as requested by physician.   Complete by: As directed    [facility] to provide Cath Flo 2mg    Complete by: As directed    Administer for PICC line occlusion and as ordered by physician for other access device issues.   Amb Referral to Cardiac Rehabilitation   Complete by: As directed    Diagnosis: Valve Replacement   Valve: Aortic   After initial evaluation and assessments completed: Virtual Based Care may be provided alone or in conjunction with Phase 2 Cardiac Rehab based on patient barriers.: Yes   Intensive Cardiac Rehabilitation (ICR) [facility] location only OR Traditional Cardiac Rehabilitation (TCR) *If criteria for ICR are not met will enroll in TCR [facility] only): Yes   Anaphylaxis Kit: Provided to treat any anaphylactic reaction to the medication being provided to the patient if First Dose or when requested by physician   Complete by: As directed    Epinephrine 1mg /ml vial / amp: Administer 0.3mg  (0.34ml) subcutaneously once for moderate to severe anaphylaxis, nurse to call physician and  pharmacy when reaction occurs and call 911 if needed for immediate care   Diphenhydramine 50mg /ml IV vial: Administer 25-50mg  IV/IM PRN for first dose reaction, rash, itching, mild reaction, nurse to call physician and pharmacy when reaction occurs   Sodium Chloride 0.9% NS IV: Administer if needed for hypovolemic blood pressure drop or as ordered by physician after call to physician with anaphylactic reaction   Change dressing on IV access line weekly and PRN   Complete by: As directed    Discharge patient   Complete by: As directed    Discharge disposition: 01-Home or Self Care   Discharge patient date: 03/26/2023   Flush IV access with Sodium Chloride 0.9% and Heparin 10 units/ml or 100 units/ml   Complete by: As directed    Home infusion instructions - [facility]   Complete by: As directed    Instructions: Flush IV access with Sodium Chloride 0.9% and Heparin 10units/ml or 100units/ml   Change dressing on IV access line: Weekly and PRN   Instructions Cath Flo 2mg : Administer for PICC Line occlusion and as ordered by physician for other access device   [facility] pharmacist to adjust dose for: Vancomycin, Aminoglycosides and other anti-infective therapies as requested by physician   Method of administration may be changed at the discretion of home infusion pharmacist based upon assessment of the patient and/or caregiver's ability to self-administer the medication ordered   Complete by: As directed       Allergies  as of 03/26/2023       Reactions   Hydrocodone Rash        Medication List     STOP taking these medications    acetaminophen 650 MG CR tablet Commonly known as: TYLENOL Replaced by: acetaminophen 325 MG tablet   DULoxetine 60 MG capsule Commonly known as: CYMBALTA   ibuprofen 800 MG tablet Commonly known as: ADVIL   oxyCODONE 5 MG immediate release tablet Commonly known as: Roxicodone       TAKE these medications     acetaminophen 325 MG tablet Commonly known as: TYLENOL Take 2 tablets (650 mg total) by mouth every 4 (four) hours as needed for mild pain (pain score 1-3) or fever. Replaces: acetaminophen 650 MG CR tablet   ALPRAZolam 1 MG tablet Commonly known as: XANAX Take 1 mg by mouth at bedtime as needed for anxiety.   amLODipine 10 MG tablet Commonly known as: NORVASC Take 1 tablet (10 mg total) by mouth daily.   atorvastatin 40 MG tablet Commonly known as: LIPITOR Take 1 tablet (40 mg total) by mouth daily.   furosemide 40 MG tablet Commonly known as: LASIX Take 1 tablet (40 mg total) by mouth daily.   magnesium oxide 400 (240 Mg) MG tablet Commonly known as: MAG-OX Take 1 tablet (400 mg total) by mouth 2 (two) times daily.   olmesartan 40 MG tablet Commonly known as: BENICAR Take 40 mg by mouth daily.   omeprazole 20 MG tablet Commonly known as: PRILOSEC OTC Take 20 mg by mouth daily.   penicillin G IVPB Inject 24 Million Units into the vein daily for 24 days. As a continuous infusion Indication:  Endocarditis First Dose: Yes Last Day of Therapy:  04/13/23 Labs - Once weekly:  CBC/D and BMP, Labs - Once weekly: ESR and CRP Method of administration: Elastomeric (Continuous infusion) Method of administration may be changed at the discretion of home infusion pharmacist based upon assessment of the patient and/or caregiver's ability to self-administer the medication ordered.   potassium chloride SA 20 MEQ tablet Commonly known as: KLOR-CON M Take 1 tablet (20 mEq total) by mouth daily.   traMADol 50 MG tablet Commonly known as: ULTRAM Take 1 tablet (50 mg total) by mouth every 6 (six) hours as needed for up to 7 days for moderate pain (pain score 4-6).   warfarin 4 MG tablet Commonly known as: COUMADIN Take 1 tablet (4 mg total) by mouth daily at 4 PM. As directed by coumadin clinic               Discharge Care Instructions  (From admission, onward)            Start     Ordered   03/23/23 0000  Change dressing on IV access line weekly and PRN  (Home infusion instructions - [facility] )        03/23/23 1351            Follow-up Information     [name], MD Follow up on 04/10/2023.   Specialty: Cardiothoracic Surgery Why: Appointment is at 10:00 Contact information: [address] [place] [address] [place] [address] [phone]         [facility] Follow up on 04/10/2023.   Why: Please get CXR at 9:00 prior to your appointment with Dr. Sunday Corn office Contact information: [address] [place] [address]        [facility]  Follow up on 03/26/2023.   Specialty: Cardiology Why: 2:30PM. Coumadin clinic visit Contact information: [address] [place] [address] [phone]        [facility], [place] Follow up on 04/13/2023.   Specialties: Cardiology, Radiology Why: 11:20AM. Post hospital follow up cardiology Contact information: [address] [place] [address] [phone]         [facility] Follow up.   Why: Home IV abx arranged- they will f/u for education and provide meds with RN support (contact [phone])        [facility] Follow up.   Why: Iantha Fallen)- [facility]-  for weekly lab draws and PICC line care- anticipate visit for 1/30- they will contat you to schedule Contact information: [address] [place] [address] [phone]                 Signed:  Rowe Clack, PA-C  03/28/2023, 12:55 PM

## 2023-03-20 DIAGNOSIS — I5033 Acute on chronic diastolic (congestive) heart failure: Secondary | ICD-10-CM | POA: Diagnosis not present

## 2023-03-20 DIAGNOSIS — I330 Acute and subacute infective endocarditis: Secondary | ICD-10-CM | POA: Diagnosis not present

## 2023-03-20 LAB — BASIC METABOLIC PANEL
Anion gap: 5 (ref 5–15)
BUN: 25 mg/dL — ABNORMAL HIGH (ref 8–23)
CO2: 23 mmol/L (ref 22–32)
Calcium: 8.2 mg/dL — ABNORMAL LOW (ref 8.9–10.3)
Chloride: 103 mmol/L (ref 98–111)
Creatinine, Ser: 0.73 mg/dL (ref 0.61–1.24)
GFR, Estimated: >60 mL/min (ref >60)
Glucose, Bld: 84 mg/dL (ref 70–99)
Potassium: 4.2 mmol/L (ref 3.5–5.1)
Sodium: 131 mmol/L — ABNORMAL LOW (ref 135–145)

## 2023-03-20 LAB — PROTIME-INR
INR: 2.1 — ABNORMAL HIGH (ref 0.8–1.2)
Prothrombin Time: 23.9 s — ABNORMAL HIGH (ref 11.4–15.2)

## 2023-03-20 LAB — HEPARIN LEVEL (UNFRACTIONATED): Heparin Unfractionated: <0.1 [IU]/mL — ABNORMAL LOW (ref 0.30–0.70)

## 2023-03-20 LAB — PHOSPHORUS: Phosphorus: 3.1 mg/dL (ref 2.5–4.6)

## 2023-03-20 LAB — MAGNESIUM: Magnesium: 1.8 mg/dL (ref 1.7–2.4)

## 2023-03-20 MED ORDER — WARFARIN SODIUM 2.5 MG PO TABS
2.5000 mg | ORAL_TABLET | Freq: Once | ORAL | Status: AC
  Administered 2023-03-20: 2.5 mg via ORAL
  Filled 2023-03-20: qty 1

## 2023-03-20 NOTE — Progress Notes (Signed)
4 Days Post-Op Procedure(s) (LRB): AORTIC VALVE REPLACEMENT (AVR) USING ON-X PROSTHETIC AORTIC HEART VALVE WITH ANATOMIC SEWING RING SIZE (N/A) MITRAL VALVE (MV) REPLACEMENT USING ON-X PROSTHETIC MITRAL HEART VALVE WITH STANDARD SEWING RING SIZE 31/33MM (N/A) TRANSESOPHAGEAL ECHOCARDIOGRAM (TEE) (N/A) Subjective: Up in chair, some incisional pain  Objective: Vital signs in last 24 hours: Temp:  [97.6 F (36.4 C)-98.4 F (36.9 C)] 98 F (36.7 C) (01/21 0600) Pulse Rate:  [56-72] 72 (01/21 0600) Cardiac Rhythm: Normal sinus rhythm (01/21 0400) Resp:  [0-29] 29 (01/21 0600) BP: (99-147)/(51-87) 147/53 (01/21 0600) SpO2:  [90 %-100 %] 90 % (01/21 0600) Weight:  [85.1 kg] 85.1 kg (01/21 0500)  Hemodynamic parameters for last 24 hours:    Intake/Output from previous day: 01/20 0701 - 01/21 0700 In: 1360.3 [P.O.:240; I.V.:161.2; IV Piggyback:959.1] Out: 950 [Urine:950] Intake/Output this shift: Total I/O In: 62.3 [I.V.:3.2; IV Piggyback:59.1] Out: -   General appearance: alert, cooperative, and no distress Neurologic: intact Heart: regular rate and rhythm Lungs: clear to auscultation bilaterally Wound: clean and dry  Lab Results: Recent Labs    03/18/23 0450 03/19/23 0624  WBC 13.2* 8.9  HGB 7.6* 7.4*  HCT 23.9* 23.5*  PLT 122* 140*   BMET:  Recent Labs    03/19/23 0624 03/20/23 0243  NA 131* 131*  K 5.1 4.2  CL 101 103  CO2 20* 23  GLUCOSE 88 84  BUN 31* 25*  CREATININE 0.87 0.73  CALCIUM 8.2* 8.2*    PT/INR:  Recent Labs    03/20/23 0243  LABPROT 23.9*  INR 2.1*   ABG    Component Value Date/Time   PHART 7.344 (L) 03/17/2023 0107   HCO3 22.3 03/17/2023 0107   TCO2 24 03/17/2023 0107   ACIDBASEDEF 3.0 (H) 03/17/2023 0107   O2SAT 97 03/17/2023 0107   CBG (last 3)  Recent Labs    03/19/23 0621 03/19/23 1203 03/19/23 1706  GLUCAP 71 91 91    Assessment/Plan: S/P Procedure(s) (LRB): AORTIC VALVE REPLACEMENT (AVR) USING ON-X  PROSTHETIC AORTIC HEART VALVE WITH ANATOMIC SEWING RING SIZE (N/A) MITRAL VALVE (MV) REPLACEMENT USING ON-X PROSTHETIC MITRAL HEART VALVE WITH STANDARD SEWING RING SIZE 31/33MM (N/A) TRANSESOPHAGEAL ECHOCARDIOGRAM (TEE) (N/A) POD # 4 NEURO- intact CV- in junctional rhythm  INR 2.1, dc heparin  Continue warfarin  Will likely need pacemaker ID- Strep endocarditis  On PCN G  OR cultures negative  ID recommends 4 weeks postop RESP- IS RENAL- continue PO Lasix ENDO- insulin stopped Gi -tolerating diet Cardiac rehab   LOS: 12 days    Thomas [name] 03/20/2023

## 2023-03-20 NOTE — Progress Notes (Signed)
Rounding Note    Patient Name: Thomas [name] Date of Encounter: 03/20/2023  [facility] Cardiologist: None  Subjective   OOB to chair Ambulated yesterday a few times  Inpatient Medications    Scheduled Meds:  acetaminophen  1,000 mg Oral Q6H   Or   acetaminophen (TYLENOL) oral liquid 160 mg/5 mL  1,000 mg Per Tube Q6H   amLODipine  10 mg Oral Daily   atorvastatin  40 mg Oral Daily   bisacodyl  10 mg Oral Daily   Or   bisacodyl  10 mg Rectal Daily   Chlorhexidine Gluconate Cloth  6 each Topical Daily   docusate sodium  200 mg Oral BID   folic acid  1 mg Oral Daily   furosemide  40 mg Oral BID   irbesartan  150 mg Oral Daily   mupirocin ointment  1 Application Nasal BID   pantoprazole  40 mg Oral Daily   polyethylene glycol  17 g Oral Daily   senna  2 tablet Oral QHS   sodium chloride flush  10-40 mL Intracatheter Q12H   sodium chloride flush  10-40 mL Intracatheter Q12H   sodium chloride flush  3 mL Intravenous Q12H   thiamine  100 mg Oral Daily   Or   thiamine  100 mg Intravenous Daily   warfarin  2.5 mg Oral ONCE-1600   Warfarin - Pharmacist Dosing Inpatient   Does not apply q1600   Continuous Infusions:  penicillin G potassium 12 Million Units in sodium chloride 0.9 % 500 mL CONTINUOUS infusion 41.7 mL/hr at 03/20/23 0725   PRN Meds: diphenhydrAMINE, LORazepam, metoprolol tartrate, morphine injection, ondansetron (ZOFRAN) IV, oxyCODONE, sodium chloride flush, sodium chloride flush, sodium chloride flush, traMADol   Vital Signs    Vitals:   03/20/23 0500 03/20/23 0600 03/20/23 0650 03/20/23 0700  BP: 123/62 (!) 147/53  (!) 117/59  Pulse: (!) 59 72 61 (!) 59  Resp: 11 (!) 29 (!) 23 15  Temp:  98 F (36.7 C)    TempSrc:  Oral    SpO2: 92% 90% 96% 94%  Weight: 85.1 kg     Height:        Intake/Output Summary (Last 24 hours) at 03/20/2023 0824 Last data filed at 03/20/2023 0725 Gross per 24 hour  Intake 1380.9 ml  Output 950 ml  Net  430.9 ml      03/20/2023    5:00 AM 03/19/2023    5:00 AM 03/18/2023    5:00 AM  Last 3 Weights  Weight (lbs) 187 lb 11.2 oz 185 lb 6.5 oz 177 lb 0.5 oz  Weight (kg) 85.14 kg 84.1 kg 80.3 kg      Telemetry    CHB, V rates 50's-60's - Personally Reviewed  ECG    No new EKGs - Personally Reviewed  Physical Exam   GEN: No acute distress.   Neck: No JVD Cardiac: RRR, no murmurs, rubs, or gallops.  Respiratory: CTA n/l GI: Soft, nontender, non-distended  MS: No edema; No deformity. Neuro:  Nonfocal  Psych: Normal affect   Labs    High Sensitivity Troponin:   Recent Labs  Lab 03/08/23 0811 03/08/23 1217  TROPONINIHS 27* 27*     Chemistry Recent Labs  Lab 03/17/23 1600 03/17/23 2233 03/18/23 0450 03/19/23 0624 03/20/23 0243  NA 132* 128* 131* 131* 131*  K 4.9 4.9 5.2* 5.1 4.2  CL 105 100 102 101 103  CO2 20* 22 21* 20* 23  GLUCOSE  134* 150* 114* 88 84  BUN 25* 30* 33* 31* 25*  CREATININE 1.25* 1.31* 1.27* 0.87 0.73  CALCIUM 8.4* 8.3* 8.5* 8.2* 8.2*  MG 2.4 2.4  --   --  1.8  GFRNONAA >60 >60 >60 >60 >60  ANIONGAP 7 6 8 10 5     Lipids  Recent Labs  Lab 03/17/23 0419  CHOL 50  TRIG 38  HDL 13*  LDLCALC 29  CHOLHDL 3.8    Hematology Recent Labs  Lab 03/17/23 1600 03/18/23 0450 03/19/23 0624  WBC 14.0* 13.2* 8.9  RBC 2.70* 2.69* 2.62*  HGB 7.4* 7.6* 7.4*  HCT 24.0* 23.9* 23.5*  MCV 88.9 88.8 89.7  MCH 27.4 28.3 28.2  MCHC 30.8 31.8 31.5  RDW 18.1* 18.0* 17.7*  PLT 109* 122* 140*   Thyroid No results for input(s): "TSH", "FREET4" in the last 168 hours.  BNPNo results for input(s): "BNP", "PROBNP" in the last 168 hours.  DDimer No results for input(s): "DDIMER" in the last 168 hours.   Radiology    DG Chest Port 1 View Result Date: 03/19/2023 CLINICAL DATA:  Status post peripherally inserted central catheter (PICC) central line placement [id number] EXAM: PORTABLE CHEST 1 VIEW COMPARISON:  03/19/2023 FINDINGS: Left arm PICC line has been placed.  Catheter extends into the SVC and the tip is probably near the superior cavoatrial junction but the tip is poorly characterized on this examination. Evidence for bilateral pulmonary edema and bilateral pleural effusions. Heart size is stable with postsurgical changes in the heart. Negative for a pneumothorax. IMPRESSION: 1. Left arm PICC line extends into the SVC. Tip is probably near the superior cavoatrial junction but the tip is poorly characterized on this examination. 2. Bilateral pulmonary edema and pleural effusions. Electronically Signed   By: Richarda Overlie M.D.   On: 03/19/2023 11:28   DG Chest Port 1 View Result Date: 03/19/2023 CLINICAL DATA:  [id number] Atelectasis 91209 EXAM: PORTABLE CHEST - 1 VIEW COMPARISON:  03/18/2023 FINDINGS: The right IJ venous sheath has been removed. Some increase in perihilar and bibasilar pulmonary vascular congestion. Some worsening of retrocardiac atelectasis/consolidation. Heart size upper limits normal.  Post valve surgery x2. Blunting of lateral costophrenic angles with suggestion of layering pleural effusions. Sternotomy wires. IMPRESSION: Worsening pulmonary vascular congestion and retrocardiac atelectasis/consolidation. Electronically Signed   By: Corlis Leak M.D.   On: 03/19/2023 09:21   [place] EKG SITE RITE Result Date: 03/19/2023 If Site Rite image not attached, placement could not be confirmed due to current cardiac rhythm.   Cardiac Studies   03/16/23: inter-op TEE Complications: No known complications during this procedure. POST-OP IMPRESSIONS _ Left Ventricle: The left ventriclar function remains normal on inotropic support. _ Right Ventricle: The right ventriclar function is normal on inotropic support. _ Aorta: The aorta appears unchanged from pre-bypass. There is no dissection _ Aortic Valve: There is a mechanical valve in the aortic position the valve is well seated with no paravalvular leak. Trace intravalvular regurgiation jets visualized  consistent with normal washing jets. Mean PG . Vmax 2.8 m/s _ Mitral Valve: There is a mechanical valve in the mitral position. The valve is well seated with no paravalvular leak. Trace regurgitation jets visualized consistent with normal washing jets. _ Tricuspid Valve: The tricuspid valve appears unchanged from pre-bypass. There is mild TR. _ Pulmonic Valve: The pulmonic valve appears unchanged from pre-bypass. _ Interatrial Septum: The interatrial septum appears intact with no shunt following surgical closure. _ Pericardium: There is no pericardial effusion.  Patient Profile     63 y.o. 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 CVTS was consulted 1/14 in this setting. 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 Abx changed to pen G when bcx resulted pan sensitive   Underwent MV replacement, AV replacement, drainage of bilat pleural effusions 1/17 with CVTS.   Assessment & Plan    CHB Persists today ( on POD #4 He will need pacing Escape rates high 50's-60's Inter op TEE > post with preserved LVEF  Picc line on Left for IV antibiotics Leadless is the best option for him given his hx/bacteremia/endocarditis  Will look to tomorrow, perhaps, will hold NPO Follow BC from yesterday VALVE cultures negative so far Afebrile  2. AFlutter  Noted yesterday > heparin gtt started INR today 2.1 Ok to stop heparin  Please do not let INR rise sharply, keep about 2.0 if able  C/w attending/ID: VHD S/p  AVR/MVR Both mechanical INR today is 2.1   Bacteremia Endocarditis D/w ID yesterday >> 4 weeks IV antibiotic recommended From their perspective > OK to implant when needed [facility] yesterday drawn    For questions or updates, please contact [facility] Please consult [email or web address] for contact info under        Signed, Sheilah Pigeon, PA-C  03/20/2023, 8:24 AM

## 2023-03-20 NOTE — Progress Notes (Signed)
PHARMACY - ANTICOAGULATION CONSULT NOTE  Pharmacy Consult for heparin Indication:  MVR/AVR  Labs: Recent Labs    03/17/23 1600 03/17/23 2233 03/18/23 0450 03/19/23 0624 03/19/23 1939 03/20/23 0243  HGB 7.4*  --  7.6* 7.4*  --   --   HCT 24.0*  --  23.9* 23.5*  --   --   PLT 109*  --  122* 140*  --   --   LABPROT  --   --  17.1* 20.5*  --  23.9*  INR  --   --  1.4* 1.7*  --  2.1*  HEPARINUNFRC  --   --   --   --  <0.10* <0.10*  CREATININE 1.25*   < > 1.27* 0.87  --  0.73   < > = values in this interval not displayed.   Assessment: 63yo male remains subtherapeutic on heparin after rate change; no infusion issues or signs of bleeding per RN.  Goal of Therapy:  Heparin level 0.3-0.7 units/ml   Plan:  Increase heparin infusion cautiously by 2 units/kg/hr to 1100 units/hr. Check level in 6 hours.   Vernard Gambles, PharmD, BCPS 03/20/2023 3:49 AM

## 2023-03-20 NOTE — Progress Notes (Signed)
Patient ID: Thomas [name], male   DOB: 25-Aug-1960, 63 y.o.   MRN: [id number]  TCTS Evening Rounds:  Hemodynamically stable. Remains in junctional rhythm 67. Plan is for PPM tomorrow per EP.  Otherwise he is doing well.

## 2023-03-20 NOTE — Progress Notes (Signed)
[facility]   Reason for visit: Follow-up on infective endocarditis  Interval History: He remains in the ICU.  No associated rash or diarrhea.  Remains afebrile  Physical Exam: Constitutional:  Vitals:   03/20/23 1000 03/20/23 1119  BP: (!) 127/58   Pulse: 60   Resp: 16   Temp:  98.2 F (36.8 C)  SpO2: 97%    patient appears in NAD Respiratory: Normal respiratory effort  Review of Systems: Constitutional: Negative for fever and chills Lab Results  Component Value Date   WBC 8.9 03/19/2023   HGB 7.4 (L) 03/19/2023   HCT 23.5 (L) 03/19/2023   MCV 89.7 03/19/2023   PLT 140 (L) 03/19/2023    Lab Results  Component Value Date   CREATININE 0.73 03/20/2023   BUN 25 (H) 03/20/2023   NA 131 (L) 03/20/2023   K 4.2 03/20/2023   CL 103 03/20/2023   CO2 23 03/20/2023    Lab Results  Component Value Date   ALT 34 03/08/2023   AST 79 (H) 03/08/2023   ALKPHOS 96 03/08/2023     Microbiology: Recent Results (from the past 240 hours)  Culture, blood (Routine X 2) w Reflex to ID Panel     Status: None   Collection Time: 03/11/23  8:53 PM   Specimen: BLOOD  Result Value Ref Range Status   Specimen Description BLOOD BLOOD LEFT HAND  Final   Special Requests   Final    BOTTLES DRAWN AEROBIC AND ANAEROBIC Blood Culture results may not be optimal due to an inadequate volume of blood received in culture bottles   Culture   Final    NO GROWTH 5 DAYS Performed at [facility], [address] [place], [place] [address]    Report Status 03/16/2023 FINAL  Final  Culture, blood (Routine X 2) w Reflex to ID Panel     Status: None   Collection Time: 03/11/23  8:53 PM   Specimen: BLOOD  Result Value Ref Range Status   Specimen Description BLOOD BLOOD LEFT ARM  Final   Special Requests   Final    BOTTLES DRAWN AEROBIC AND ANAEROBIC Blood Culture results may not be optimal due to an inadequate volume of blood received in culture bottles   Culture    Final    NO GROWTH 5 DAYS Performed at [facility], [address] [place], [place] [address]    Report Status 03/16/2023 FINAL  Final  Surgical pcr screen     Status: None   Collection Time: 03/16/23  4:59 AM   Specimen: Nasal Mucosa; Nasal Swab  Result Value Ref Range Status   MRSA, PCR NEGATIVE NEGATIVE Final   Staphylococcus aureus NEGATIVE NEGATIVE Final    Comment: (NOTE) The Xpert SA Assay (FDA approved for NASAL specimens in patients 73 years of age and older), is one component of a comprehensive surveillance program. It is not intended to diagnose infection nor to guide or monitor treatment. Performed at [facility], [address] [place], [place] [address]   Aerobic/Anaerobic Culture w Gram Stain (surgical/deep wound)     Status: None (Preliminary result)   Collection Time: 03/16/23  9:24 AM   Specimen: Mitral Valve Leaflets; Tissue  Result Value Ref Range Status   Specimen Description VALVE  Final   Special Requests MITRAL  Final   Gram Stain   Final    RARE WBC PRESENT,BOTH PMN AND MONONUCLEAR NO ORGANISMS SEEN  Culture   Final    NO GROWTH 4 DAYS NO ANAEROBES ISOLATED; CULTURE IN PROGRESS FOR 5 DAYS Performed at [facility], [address], [place], [place] [address]    Report Status PENDING  Incomplete  Acid Fast Smear (AFB)     Status: None   Collection Time: 03/16/23  9:24 AM   Specimen: Mitral Valve Leaflets; Tissue  Result Value Ref Range Status   AFB Specimen Processing Comment  Final    Comment: Tissue Grinding and Digestion/Decontamination   Acid Fast Smear Negative  Final    Comment: (NOTE) Performed At: [facility] [address] [place], [place] [id number] Jolene Schimke MD XB:[id number]    Source (AFB) VALVE  Final    Comment: Performed at [facility], [address] [place], [place] [address]  Aerobic/Anaerobic Culture w Gram Stain (surgical/deep wound)     Status: None (Preliminary result)    Collection Time: 03/16/23 10:37 AM   Specimen: Path Tissue  Result Value Ref Range Status   Specimen Description VALVE  Final   Special Requests AORTIC  Final   Gram Stain   Final    RARE WBC PRESENT,BOTH PMN AND MONONUCLEAR NO ORGANISMS SEEN    Culture   Final    NO GROWTH 4 DAYS NO ANAEROBES ISOLATED; CULTURE IN PROGRESS FOR 5 DAYS Performed at [facility], [address] [place], [place] [address]    Report Status PENDING  Incomplete  Acid Fast Smear (AFB)     Status: None   Collection Time: 03/16/23 10:37 AM   Specimen: Path Tissue  Result Value Ref Range Status   AFB Specimen Processing Comment  Final    Comment: Tissue Grinding and Digestion/Decontamination   Acid Fast Smear Negative  Final    Comment: (NOTE) Performed At: [facility] [address] [place], [place] [id number] Jolene Schimke MD KG:[id number]    Source (AFB) VALVE  Final    Comment: Performed at [facility], [address], [place], [place] [address]  Culture, blood (Routine X 2) w Reflex to ID Panel     Status: None (Preliminary result)   Collection Time: 03/19/23 12:26 PM   Specimen: BLOOD  Result Value Ref Range Status   Specimen Description BLOOD RIGHT ANTECUBITAL  Final   Special Requests   Final    BOTTLES DRAWN AEROBIC AND ANAEROBIC Blood Culture results may not be optimal due to an inadequate volume of blood received in culture bottles   Culture   Final    NO GROWTH < 24 HOURS Performed at [facility], [address], [place], [place] [address]    Report Status PENDING  Incomplete  Culture, blood (Routine X 2) w Reflex to ID Panel     Status: None (Preliminary result)   Collection Time: 03/19/23 12:26 PM   Specimen: BLOOD  Result Value Ref Range Status   Specimen Description BLOOD RIGHT ANTECUBITAL  Final   Special Requests   Final    BOTTLES DRAWN AEROBIC AND ANAEROBIC Blood Culture results may not be optimal due to an inadequate volume of blood received in  culture bottles   Culture   Final    NO GROWTH < 24 HOURS Performed at [facility], [address] [place], [place] [address]    Report Status PENDING  Incomplete    Impression/Plan:  1.  Infective endocarditis.  He was noted to have dual valve endocarditis and now status  post dual valve replacement.  He is continuing to do well and and remains on penicillin for the Streptococcus infantarius.  No issues with the penicillin and will continue.  Plan for a 4-week course postoperatively.  2.  Access.  PICC line now in place and will continue with the penicillin.  3.  Heart block and a flutter.  He seems to be stable at this time.  Repeat blood culture sent just to verify clearance postoperatively.  To date they remain no growth to date.  4.  OPAT.  Patient will be going home with IV antibiotics as listed below.  Diagnosis: Streptococcus infective endocarditis.  Culture Result: Streptococcus infantarius  Allergies  Allergen Reactions   Hydrocodone Rash    OPAT Orders Discharge antibiotics to be given via PICC line Discharge antibiotics: IV penicillin 24,000,000 units daily as a continuous infusion Per pharmacy protocol yes Duration: 4 weeks postoperatively End Date: April 13, 2023  [facility] Care Per Protocol: Yes  Home health RN for IV administration and teaching; PICC line care and labs.    Labs weekly while on IV antibiotics: _x_ CBC with differential __ BMP _x_ CMP __ CRP __ ESR __ Vancomycin trough __ CK  _x_ Please pull PIC at completion of IV antibiotics __ Please leave PIC in place until doctor has seen patient or been notified  Fax weekly labs to [phone]  Clinic Follow Up Appt: 2/10 with Dr. Luciana Axe  @ 2:30 pm

## 2023-03-20 NOTE — Progress Notes (Signed)
IVT consult placed for additional access. Patient has SL PICC in left upper arm. Right lower arm has cast in place from hand to right AC. Right cephalic does not compress; only peripheral option is right basilic. PIV placed with [place] in basilic however would highly recommend PICC exchange for double lumen in order to preserve vasculature in case of infiltration/extravasation. Patient instructed to notify team if he notices burning or pain in upper arm immediately for assessment. Primary RN notified of findings and recommendations.   Blood pressure cuff needs to be moved to lower limb to prevent damaging newly placed catheter.  Lenix Benoist Loyola Mast, RN

## 2023-03-20 NOTE — Progress Notes (Addendum)
PHARMACY - ANTICOAGULATION CONSULT NOTE  Pharmacy Consult for warfarin + add IV heparin Indication: mechanical AVR and MVR  Allergies  Allergen Reactions   Hydrocodone Rash    Patient Measurements: Height: 5\' 11"  (180.3 cm) Weight: 85.1 kg (187 lb 11.2 oz) IBW/kg (Calculated) : 75.3 Heparin dosing weight: 78.6 kg   Vital Signs: Temp: 98 F (36.7 C) (01/21 0600) Temp Source: Oral (01/21 0600) BP: 140/74 (01/21 0800) Pulse Rate: 58 (01/21 0800)  Labs: Recent Labs    03/17/23 1600 03/17/23 2233 03/18/23 0450 03/19/23 0624 03/19/23 1939 03/20/23 0243  HGB 7.4*  --  7.6* 7.4*  --   --   HCT 24.0*  --  23.9* 23.5*  --   --   PLT 109*  --  122* 140*  --   --   LABPROT  --   --  17.1* 20.5*  --  23.9*  INR  --   --  1.4* 1.7*  --  2.1*  HEPARINUNFRC  --   --   --   --  <0.10* <0.10*  CREATININE 1.25*   < > 1.27* 0.87  --  0.73   < > = values in this interval not displayed.    Estimated Creatinine Clearance: 102 mL/min (by C-G formula based on SCr of 0.73 mg/dL).   Medical History: Past Medical History:  Diagnosis Date   Blood dyscrasia    GERD (gastroesophageal reflux disease)    History of kidney stones    Hypertension    Thrombocytopenia (HCC) 04/12/2016    Medications:  Medications Prior to Admission  Medication Sig Dispense Refill Last Dose/Taking   acetaminophen (TYLENOL) 650 MG CR tablet Take 1,300 mg by mouth every 8 (eight) hours as needed for pain.   Past Week   ALPRAZolam (XANAX) 1 MG tablet Take 1 mg by mouth at bedtime as needed for anxiety.   Past Week   ibuprofen (ADVIL) 800 MG tablet Take 1 tablet (800 mg total) by mouth 3 (three) times daily. Take with food (Patient taking differently: Take 200 mg by mouth every 8 (eight) hours as needed for fever, headache or mild pain (pain score 1-3). Take with food) 60 tablet 0 Taking Differently   olmesartan (BENICAR) 40 MG tablet Take 40 mg by mouth daily.   Past Week   omeprazole (PRILOSEC OTC) 20 MG  tablet Take 20 mg by mouth daily.   03/07/2023   oxyCODONE (ROXICODONE) 5 MG immediate release tablet Take 1 tablet (5 mg total) by mouth every 6 (six) hours as needed. (Patient not taking: Reported on 03/08/2023) 30 tablet 0 Not Taking   DULoxetine (CYMBALTA) 60 MG capsule Take 60 mg by mouth daily. (Patient not taking: Reported on 03/08/2023)   Not Taking   Scheduled:   acetaminophen  1,000 mg Oral Q6H   Or   acetaminophen (TYLENOL) oral liquid 160 mg/5 mL  1,000 mg Per Tube Q6H   amLODipine  10 mg Oral Daily   atorvastatin  40 mg Oral Daily   bisacodyl  10 mg Oral Daily   Or   bisacodyl  10 mg Rectal Daily   Chlorhexidine Gluconate Cloth  6 each Topical Daily   docusate sodium  200 mg Oral BID   folic acid  1 mg Oral Daily   furosemide  40 mg Oral BID   irbesartan  150 mg Oral Daily   mupirocin ointment  1 Application Nasal BID   pantoprazole  40 mg Oral Daily   polyethylene glycol  17 g Oral Daily   senna  2 tablet Oral QHS   sodium chloride flush  10-40 mL Intracatheter Q12H   sodium chloride flush  10-40 mL Intracatheter Q12H   sodium chloride flush  3 mL Intravenous Q12H   thiamine  100 mg Oral Daily   Or   thiamine  100 mg Intravenous Daily   warfarin  2.5 mg Oral ONCE-1600   Warfarin - Pharmacist Dosing Inpatient   Does not apply q1600    Assessment: 63 yo male with severe MR and AVR 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 Pharmacy consulted to dose warfarin. No anticoagulants noted PTA.  INR is subtherapeutic but increasing at 2.1 (+0.4) after warfarin 5 mg x1. CBC stable and no signs of bleeding. Appetite is low but somewhat improving. Patient is no complaining of any nausea or GI irritation.  With INR >2.0, EP is okay with stopping IV UFH this morning.  Goal of Therapy:  INR goal 2.5-3.5 (discuss with Dr. Dorris Fetch) Monitor platelets by anticoagulation protocol: Yes   Plan:  -Warfarin 2.5 mg po today -Monitor daily PT/INR, CBC, and signs of bleeding   Wilmer Floor,  PharmD PGY2 Cardiology Pharmacy Resident 03/20/2023  10:00 AM

## 2023-03-20 NOTE — Anesthesia Preprocedure Evaluation (Signed)
Anesthesia Evaluation    Reviewed: Allergy & Precautions, H&P , Patient's Chart, lab work & pertinent test results, Unable to perform ROS - Chart review only  Airway Mallampati: II  TM Distance: >3 FB Neck ROM: Full    Dental no notable dental hx.    Pulmonary neg pulmonary ROS   Pulmonary exam normal breath sounds clear to auscultation       Cardiovascular hypertension, Pt. on medications +CHF  Normal cardiovascular exam Rhythm:Regular Rate:Normal  Intra-op TEE 03/16/2023 _ Left Ventricle: The left ventriclar function remains normal on inotropic support.  _ Right Ventricle: The right ventriclar function is normal on inotropic support.  _ Aorta: The aorta appears unchanged from pre-bypass. There is no dissection  _ Aortic Valve: There is a mechanical valve in the aortic position the valve is well seated with no paravalvular leak. Trace intravalvular regurgiation jets visualized consistent with normal washing jets. Mean PG . Vmax 2.8 m/s  _ Mitral Valve: There is a mechanical valve in the mitral position. The valve is well seated with no paravalvular leak. Trace regurgitation jets visualized consistent with normal washing jets.  _ Tricuspid Valve: The tricuspid valve appears unchanged from pre-bypass. There is mild TR.  _ Pulmonic Valve: The pulmonic valve appears unchanged from pre-bypass.  _ Interatrial Septum: The interatrial septum appears intact with no shunt following surgical closure.  _ Pericardium: There is no pericardial effusion.    Echo 03/12/2023  1. Aortic valve vegetation noted 0.6 cm x 0.8 cm located in the commissure between the NCC/LCC. There is severe aortic regurgitation that is eccentric and likely leaflet perforation. There is holodiastolic flow reversal in the descending aorta consistent with severe AI. 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 Moderate stenosis, likely flow related in  setting of severe AI. The aortic valve is tricuspid. There is moderate calcification of the aortic valve. There is moderate thickening of the aortic valve. Aortic valve regurgitation is severe. Moderate aortic valve stenosis. Aortic regurgitation PHT measures 393 msec. Aortic valve area, by VTI measures 2.69 cm. Aortic valve mean gradient measures 22.0 mmHg. Aortic valve Vmax measures 2.99 m/s.   2. Severe eccentric mitral regurgitation that is posteriorly directed. Vegetation noted on the A3 segment that measures 0.4 cm x 0.7 cm. There is also a flail A1/A2 segment with likely leaflet perforation. Findings consistent with endocarditis and significant valvular destruction. Systolic blunting in all 4 pulmonary veins. The mitral valve is abnormal. Severe mitral valve regurgitation. No evidence of mitral stenosis.   3. Left ventricular ejection fraction, by estimation, is 60 to 65%. The left ventricle has normal function.   4. Right ventricular systolic function is normal. The right ventricular size is normal.   5. Left atrial size was mildly dilated. No left atrial/left atrial appendage thrombus was detected. The LAA emptying velocity was 54 cm/s.   6. There is mild (Grade II) layered plaque involving the descending aorta.   7. Agitated saline contrast bubble study was positive with shunting observed after >6 cardiac cycles suggestive of intrapulmonary shunting.   Conclusion(s)/Recommendation(s): Findings are concerning for vegetation/infective endocarditis as detailed above.     Neuro/Psych negative neurological ROS  negative psych ROS   GI/Hepatic Neg liver ROS,GERD  ,,  Endo/Other  negative endocrine ROS    Renal/GU negative Renal ROS  negative genitourinary   Musculoskeletal negative musculoskeletal ROS (+)    Abdominal   Peds negative pediatric ROS (+)  Hematology  (+) Blood dyscrasia, anemia thromocytopenia  Anesthesia Other Findings   Reproductive/Obstetrics negative OB  ROS                             Anesthesia Physical Anesthesia Plan  ASA: 4  Anesthesia Plan: General   Post-op Pain Management:    Induction: Intravenous  PONV Risk Score and Plan: Ondansetron and Treatment may vary due to age or medical condition  Airway Management Planned: Oral ETT  Additional Equipment: Arterial line, CVP, PA Cath, 3D TEE and Ultrasound Guidance Line Placement  Intra-op Plan:   Post-operative Plan: Post-operative intubation/ventilation  Informed Consent: I have reviewed the patients History and Physical, chart, labs and discussed the procedure including the risks, benefits and alternatives for the proposed anesthesia with the patient or authorized representative who has indicated his/her understanding and acceptance.     Dental advisory given  Plan Discussed with: CRNA  Anesthesia Plan Comments:         Anesthesia Quick Evaluation

## 2023-03-21 ENCOUNTER — Encounter (HOSPITAL_COMMUNITY): Payer: Self-pay | Admitting: Anesthesiology

## 2023-03-21 ENCOUNTER — Encounter (HOSPITAL_COMMUNITY)
Admission: EM | Disposition: A | Discharge status: Home Health | Payer: Self-pay | Source: Home / Self Care | Attending: Thoracic Surgery (Cardiothoracic Vascular Surgery)

## 2023-03-21 DIAGNOSIS — I5033 Acute on chronic diastolic (congestive) heart failure: Secondary | ICD-10-CM | POA: Diagnosis not present

## 2023-03-21 LAB — AEROBIC/ANAEROBIC CULTURE W GRAM STAIN (SURGICAL/DEEP WOUND)

## 2023-03-21 LAB — CBC
HCT: 24.1 % — ABNORMAL LOW (ref 39.0–52.0)
Hemoglobin: 7.6 g/dL — ABNORMAL LOW (ref 13.0–17.0)
MCH: 27.9 pg (ref 26.0–34.0)
MCHC: 31.5 g/dL (ref 30.0–36.0)
MCV: 88.6 fL (ref 80.0–100.0)
Platelets: 194 10*3/uL (ref 150–400)
RBC: 2.72 MIL/uL — ABNORMAL LOW (ref 4.22–5.81)
RDW: 17.8 % — ABNORMAL HIGH (ref 11.5–15.5)
WBC: 6.4 10*3/uL (ref 4.0–10.5)
nRBC: 0 % (ref 0.0–0.2)

## 2023-03-21 LAB — PROTIME-INR
INR: 2.1 — ABNORMAL HIGH (ref 0.8–1.2)
Prothrombin Time: 23.7 s — ABNORMAL HIGH (ref 11.4–15.2)

## 2023-03-21 LAB — BASIC METABOLIC PANEL
Anion gap: 5 (ref 5–15)
BUN: 19 mg/dL (ref 8–23)
CO2: 23 mmol/L (ref 22–32)
Calcium: 8.1 mg/dL — ABNORMAL LOW (ref 8.9–10.3)
Chloride: 104 mmol/L (ref 98–111)
Creatinine, Ser: 0.71 mg/dL (ref 0.61–1.24)
GFR, Estimated: >60 mL/min (ref >60)
Glucose, Bld: 89 mg/dL (ref 70–99)
Potassium: 4.3 mmol/L (ref 3.5–5.1)
Sodium: 132 mmol/L — ABNORMAL LOW (ref 135–145)

## 2023-03-21 LAB — MAGNESIUM: Magnesium: 1.6 mg/dL — ABNORMAL LOW (ref 1.7–2.4)

## 2023-03-21 LAB — PHOSPHORUS: Phosphorus: 3.9 mg/dL (ref 2.5–4.6)

## 2023-03-21 SURGERY — PACEMAKER LEADLESS INSERTION
Anesthesia: General

## 2023-03-21 MED ORDER — WARFARIN SODIUM 5 MG PO TABS
5.0000 mg | ORAL_TABLET | Freq: Once | ORAL | Status: AC
  Administered 2023-03-21: 5 mg via ORAL
  Filled 2023-03-21: qty 1

## 2023-03-21 MED ORDER — MAGNESIUM SULFATE 4 GM/100ML IV SOLN
4.0000 g | Freq: Once | INTRAVENOUS | Status: AC
  Administered 2023-03-21: 4 g via INTRAVENOUS
  Filled 2023-03-21: qty 100

## 2023-03-21 NOTE — Progress Notes (Signed)
5 Days Post-Op Procedure(s) (LRB): AORTIC VALVE REPLACEMENT (AVR) USING ON-X PROSTHETIC AORTIC HEART VALVE WITH ANATOMIC SEWING RING SIZE (N/A) MITRAL VALVE (MV) REPLACEMENT USING ON-X PROSTHETIC MITRAL HEART VALVE WITH STANDARD SEWING RING SIZE 31/33MM (N/A) TRANSESOPHAGEAL ECHOCARDIOGRAM (TEE) (N/A) Subjective: Up in chair, no complaints  Objective: Vital signs in last 24 hours: Temp:  [97.7 F (36.5 C)-98.4 F (36.9 C)] 97.7 F (36.5 C) (01/22 0600) Pulse Rate:  [57-70] 61 (01/22 0600) Cardiac Rhythm: Junctional rhythm (01/22 0400) Resp:  [10-25] 22 (01/22 0600) BP: (127-177)/(51-78) 172/63 (01/22 0600) SpO2:  [91 %-100 %] 99 % (01/22 0600) Weight:  [85.2 kg] 85.2 kg (01/22 0500)  Hemodynamic parameters for last 24 hours:    Intake/Output from previous day: 01/21 0701 - 01/22 0700 In: 1004 [I.V.:3.2; IV Piggyback:1000.8] Out: 1150 [Urine:1150] Intake/Output this shift: No intake/output data recorded.  General appearance: alert, cooperative, and no distress Neurologic: intact Heart: irregularly irregular rhythm Lungs: clear to auscultation bilaterally Wound: clean and dry  Lab Results: Recent Labs    03/19/23 0624 03/21/23 0309  WBC 8.9 6.4  HGB 7.4* 7.6*  HCT 23.5* 24.1*  PLT 140* 194   BMET:  Recent Labs    03/20/23 0243 03/21/23 0309  NA 131* 132*  K 4.2 4.3  CL 103 104  CO2 23 23  GLUCOSE 84 89  BUN 25* 19  CREATININE 0.73 0.71  CALCIUM 8.2* 8.1*    PT/INR:  Recent Labs    03/21/23 0309  LABPROT 23.7*  INR 2.1*   ABG    Component Value Date/Time   PHART 7.344 (L) 03/17/2023 0107   HCO3 22.3 03/17/2023 0107   TCO2 24 03/17/2023 0107   ACIDBASEDEF 3.0 (H) 03/17/2023 0107   O2SAT 97 03/17/2023 0107   CBG (last 3)  Recent Labs    03/19/23 0621 03/19/23 1203 03/19/23 1706  GLUCAP 71 91 91    Assessment/Plan: S/P Procedure(s) (LRB): AORTIC VALVE REPLACEMENT (AVR) USING ON-X PROSTHETIC AORTIC HEART VALVE WITH ANATOMIC SEWING  RING SIZE (N/A) MITRAL VALVE (MV) REPLACEMENT USING ON-X PROSTHETIC MITRAL HEART VALVE WITH STANDARD SEWING RING SIZE 31/33MM (N/A) TRANSESOPHAGEAL ECHOCARDIOGRAM (TEE) (N/A) POD # 5 NEURO- intact CV- in A fib with rate in 70-80 range this AM  EP possible pacer today  INR 2.1- 5 mg coumadin today RESP- IS RENAL- creatinine normal  Hyponatremia- mild, NA up to 132  Continue PO Lasix BID ENDO- CBG normal Gi- tolerating diet Doing well with cardiac rehab   LOS: 13 days    [name] 03/21/2023

## 2023-03-21 NOTE — Progress Notes (Addendum)
PHARMACY - ANTICOAGULATION CONSULT NOTE  Pharmacy Consult for warfarin Indication: mechanical AVR and MVR  Allergies  Allergen Reactions   Hydrocodone Rash    Patient Measurements: Height: 5\' 11"  (180.3 cm) Weight: 85.2 kg (187 lb 14.4 oz) IBW/kg (Calculated) : 75.3 Heparin dosing weight: 78.6 kg   Vital Signs: Temp: 97.7 F (36.5 C) (01/22 0600) Temp Source: Oral (01/22 0600) BP: 172/63 (01/22 0600) Pulse Rate: 61 (01/22 0600)  Labs: Recent Labs    03/19/23 0624 03/19/23 1939 03/20/23 0243 03/21/23 0309  HGB 7.4*  --   --  7.6*  HCT 23.5*  --   --  24.1*  PLT 140*  --   --  194  LABPROT 20.5*  --  23.9* 23.7*  INR 1.7*  --  2.1* 2.1*  HEPARINUNFRC  --  <0.10* <0.10*  --   CREATININE 0.87  --  0.73 0.71    Estimated Creatinine Clearance: 102 mL/min (by C-G formula based on SCr of 0.71 mg/dL).   Medical History: Past Medical History:  Diagnosis Date   Blood dyscrasia    GERD (gastroesophageal reflux disease)    History of kidney stones    Hypertension    Thrombocytopenia (HCC) 04/12/2016    Medications:  Medications Prior to Admission  Medication Sig Dispense Refill Last Dose/Taking   acetaminophen (TYLENOL) 650 MG CR tablet Take 1,300 mg by mouth every 8 (eight) hours as needed for pain.   Past Week   ALPRAZolam (XANAX) 1 MG tablet Take 1 mg by mouth at bedtime as needed for anxiety.   Past Week   ibuprofen (ADVIL) 800 MG tablet Take 1 tablet (800 mg total) by mouth 3 (three) times daily. Take with food (Patient taking differently: Take 200 mg by mouth every 8 (eight) hours as needed for fever, headache or mild pain (pain score 1-3). Take with food) 60 tablet 0 Taking Differently   olmesartan (BENICAR) 40 MG tablet Take 40 mg by mouth daily.   Past Week   omeprazole (PRILOSEC OTC) 20 MG tablet Take 20 mg by mouth daily.   03/07/2023   oxyCODONE (ROXICODONE) 5 MG immediate release tablet Take 1 tablet (5 mg total) by mouth every 6 (six) hours as needed.  (Patient not taking: Reported on 03/08/2023) 30 tablet 0 Not Taking   DULoxetine (CYMBALTA) 60 MG capsule Take 60 mg by mouth daily. (Patient not taking: Reported on 03/08/2023)   Not Taking   Scheduled:   acetaminophen  1,000 mg Oral Q6H   Or   acetaminophen (TYLENOL) oral liquid 160 mg/5 mL  1,000 mg Per Tube Q6H   amLODipine  10 mg Oral Daily   atorvastatin  40 mg Oral Daily   bisacodyl  10 mg Oral Daily   Or   bisacodyl  10 mg Rectal Daily   Chlorhexidine Gluconate Cloth  6 each Topical Daily   docusate sodium  200 mg Oral BID   folic acid  1 mg Oral Daily   furosemide  40 mg Oral BID   irbesartan  150 mg Oral Daily   mupirocin ointment  1 Application Nasal BID   pantoprazole  40 mg Oral Daily   polyethylene glycol  17 g Oral Daily   senna  2 tablet Oral QHS   sodium chloride flush  10-40 mL Intracatheter Q12H   sodium chloride flush  10-40 mL Intracatheter Q12H   sodium chloride flush  3 mL Intravenous Q12H   thiamine  100 mg Oral Daily   Or  thiamine  100 mg Intravenous Daily   Warfarin - Pharmacist Dosing Inpatient   Does not apply q1600    Assessment: 63 yo male with severe MR and AVR 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 Pharmacy consulted to dose warfarin. No anticoagulants noted PTA.  INR is subtherapeutic with no changes at 2.1 (+/-0) after warfarin 2.5 mg x1. CBC stable and no signs of bleeding. Appetite is low but somewhat improving. Patient is not complaining of any nausea or GI irritation.  With INR >2.0, EP is okay with stopped IV UFH 1/21.  Goal of Therapy:  INR goal 2.5-3.5 (discuss with Dr. Dorris Fetch) Monitor platelets by anticoagulation protocol: Yes   Plan:  -Warfarin 5 mg po today -Monitor daily PT/INR, CBC, and signs of bleeding   Wilmer Floor, PharmD PGY2 Cardiology Pharmacy Resident 03/21/2023  6:41 AM

## 2023-03-21 NOTE — Progress Notes (Addendum)
Rounding Note    Patient Name: Thomas [name] Date of Encounter: 03/21/2023  [facility] Cardiologist: None  Subjective   OOB to chair Happy to hear his conduction is getting better, would really like to avoid pacer if able  Inpatient Medications    Scheduled Meds:  acetaminophen  1,000 mg Oral Q6H   Or   acetaminophen (TYLENOL) oral liquid 160 mg/5 mL  1,000 mg Per Tube Q6H   amLODipine  10 mg Oral Daily   atorvastatin  40 mg Oral Daily   bisacodyl  10 mg Oral Daily   Or   bisacodyl  10 mg Rectal Daily   Chlorhexidine Gluconate Cloth  6 each Topical Daily   docusate sodium  200 mg Oral BID   folic acid  1 mg Oral Daily   furosemide  40 mg Oral BID   irbesartan  150 mg Oral Daily   mupirocin ointment  1 Application Nasal BID   pantoprazole  40 mg Oral Daily   polyethylene glycol  17 g Oral Daily   senna  2 tablet Oral QHS   sodium chloride flush  10-40 mL Intracatheter Q12H   sodium chloride flush  10-40 mL Intracatheter Q12H   sodium chloride flush  3 mL Intravenous Q12H   thiamine  100 mg Oral Daily   Or   thiamine  100 mg Intravenous Daily   warfarin  5 mg Oral ONCE-1600   Warfarin - Pharmacist Dosing Inpatient   Does not apply q1600   Continuous Infusions:  magnesium sulfate bolus IVPB     penicillin G potassium 12 Million Units in sodium chloride 0.9 % 500 mL CONTINUOUS infusion 41.7 mL/hr at 03/21/23 0600   PRN Meds: diphenhydrAMINE, LORazepam, metoprolol tartrate, morphine injection, ondansetron (ZOFRAN) IV, oxyCODONE, sodium chloride flush, sodium chloride flush, sodium chloride flush, traMADol   Vital Signs    Vitals:   03/21/23 0300 03/21/23 0400 03/21/23 0500 03/21/23 0600  BP:  (!) 165/64 (!) 168/65 (!) 172/63  Pulse: 66 (!) 59 60 61  Resp: (!) 21 19 19  (!) 22  Temp:    97.7 F (36.5 C)  TempSrc:    Oral  SpO2: 100% 97% 100% 99%  Weight:   85.2 kg   Height:        Intake/Output Summary (Last 24 hours) at 03/21/2023 0816 Last  data filed at 03/21/2023 0600 Gross per 24 hour  Intake 941.73 ml  Output 1150 ml  Net -208.27 ml      03/21/2023    5:00 AM 03/20/2023    5:00 AM 03/19/2023    5:00 AM  Last 3 Weights  Weight (lbs) 187 lb 14.4 oz 187 lb 11.2 oz 185 lb 6.5 oz  Weight (kg) 85.231 kg 85.14 kg 84.1 kg      Telemetry    SR with 1:1 conduction, long 1st degree AVBlock rates 70's,  as well as wenckebach with rates 60s - Personally Reviewed  ECG    No new EKGs - Personally Reviewed  Physical Exam   GEN: No acute distress.   Neck: No JVD Cardiac: RRR, no murmurs, rubs, or gallops.  Respiratory: CTA n/l GI: Soft, nontender, non-distended  MS: No edema; No deformity. Neuro:  Nonfocal  Psych: Normal affect   Labs    High Sensitivity Troponin:   Recent Labs  Lab 03/08/23 0811 03/08/23 1217  TROPONINIHS 27* 27*     Chemistry Recent Labs  Lab 03/17/23 2233 03/18/23 0450 03/19/23 0624 03/20/23  4010 03/21/23 0309  NA 128*   < > 131* 131* 132*  K 4.9   < > 5.1 4.2 4.3  CL 100   < > 101 103 104  CO2 22   < > 20* 23 23  GLUCOSE 150*   < > 88 84 89  BUN 30*   < > 31* 25* 19  CREATININE 1.31*   < > 0.87 0.73 0.71  CALCIUM 8.3*   < > 8.2* 8.2* 8.1*  MG 2.4  --   --  1.8 1.6*  GFRNONAA >60   < > >60 >60 >60  ANIONGAP 6   < > 10 5 5    < > = values in this interval not displayed.    Lipids  Recent Labs  Lab 03/17/23 0419  CHOL 50  TRIG 38  HDL 13*  LDLCALC 29  CHOLHDL 3.8    Hematology Recent Labs  Lab 03/18/23 0450 03/19/23 0624 03/21/23 0309  WBC 13.2* 8.9 6.4  RBC 2.69* 2.62* 2.72*  HGB 7.6* 7.4* 7.6*  HCT 23.9* 23.5* 24.1*  MCV 88.8 89.7 88.6  MCH 28.3 28.2 27.9  MCHC 31.8 31.5 31.5  RDW 18.0* 17.7* 17.8*  PLT 122* 140* 194   Thyroid No results for input(s): "TSH", "FREET4" in the last 168 hours.  BNPNo results for input(s): "BNP", "PROBNP" in the last 168 hours.  DDimer No results for input(s): "DDIMER" in the last 168 hours.   Radiology    DG Chest Port 1  View Result Date: 03/19/2023 CLINICAL DATA:  Status post peripherally inserted central catheter (PICC) central line placement [id number] EXAM: PORTABLE CHEST 1 VIEW COMPARISON:  03/19/2023 FINDINGS: Left arm PICC line has been placed. Catheter extends into the SVC and the tip is probably near the superior cavoatrial junction but the tip is poorly characterized on this examination. Evidence for bilateral pulmonary edema and bilateral pleural effusions. Heart size is stable with postsurgical changes in the heart. Negative for a pneumothorax. IMPRESSION: 1. Left arm PICC line extends into the SVC. Tip is probably near the superior cavoatrial junction but the tip is poorly characterized on this examination. 2. Bilateral pulmonary edema and pleural effusions. Electronically Signed   By: Richarda Overlie M.D.   On: 03/19/2023 11:28    Cardiac Studies   03/16/23: inter-op TEE Complications: No known complications during this procedure. POST-OP IMPRESSIONS _ Left Ventricle: The left ventriclar function remains normal on inotropic support. _ Right Ventricle: The right ventriclar function is normal on inotropic support. _ Aorta: The aorta appears unchanged from pre-bypass. There is no dissection _ Aortic Valve: There is a mechanical valve in the aortic position the valve is well seated with no paravalvular leak. Trace intravalvular regurgiation jets visualized consistent with normal washing jets. Mean PG . Vmax 2.8 m/s _ Mitral Valve: There is a mechanical valve in the mitral position. The valve is well seated with no paravalvular leak. Trace regurgitation jets visualized consistent with normal washing jets. _ Tricuspid Valve: The tricuspid valve appears unchanged from pre-bypass. There is mild TR. _ Pulmonic Valve: The pulmonic valve appears unchanged from pre-bypass. _ Interatrial Septum: The interatrial septum appears intact with no shunt following surgical closure. _ Pericardium: There is no  pericardial effusion.    Patient Profile     63 y.o. 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 CVTS was consulted 1/14 in this setting. 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 Abx changed to pen G when bcx resulted pan sensitive   Underwent MV replacement, AV replacement, drainage of bilat pleural effusions 1/17 with CVTS.   Assessment & Plan    CHB POD #5 today He has regained some AV conduction, noting period of 1:1 conduction with long 1st degree as well as Mobitz one Rates 60's Inter op TEE > post with preserved LVEF  He has had significant recovery of his conduction >>  1st degree and wenckebach since yesterday Will keep NPO recheck his tele later today to decide PPM today or not Dr. Nelly Laurence has seen/discussed with the patient  ADDEND: Recheck on telemetry with Mobitz one and 1:1 conduction with 1st degree AVblock > rates 60's-70's Will resume diet No pacer today  Picc line on Left for IV antibiotics Leadless is the best option for him given his hx/bacteremia/endocarditis    2. AFlutter  Had some rate controlled Afib yesterday INR today 2.1  Please do not let INR rise sharply, keep about 2.0 if able   C/w attending/ID: VHD S/p AVR/MVR Both mechanical INR today is 2.1 again today  Bacteremia Endocarditis D/w ID yesterday >> 4 weeks IV antibiotic recommended From their perspective > OK to implant when needed BC drawn 03/19/23  >> neg so far AV/MV cultures also neg so far    For questions  or updates, please contact [facility] Please consult [email or web address] for contact info under        Signed, Sheilah Pigeon, PA-C  03/21/2023, 8:16 AM

## 2023-03-21 NOTE — Progress Notes (Signed)
EVENING ROUNDS NOTE :     [address]       [place] [address]             [phone]                 5 Days Post-Op Procedure(s) (LRB): AORTIC VALVE REPLACEMENT (AVR) USING ON-X PROSTHETIC AORTIC HEART VALVE WITH ANATOMIC SEWING RING SIZE (N/A) MITRAL VALVE (MV) REPLACEMENT USING ON-X PROSTHETIC MITRAL HEART VALVE WITH STANDARD SEWING RING SIZE 31/33MM (N/A) TRANSESOPHAGEAL ECHOCARDIOGRAM (TEE) (N/A)   Total Length of Stay:  LOS: 13 days  Events:   No events No PPM today Resting comfortably    BP (!) 154/84   Pulse 86   Temp (!) 97.3 F (36.3 C)   Resp (!) 22   Ht 5\' 11"  (1.803 m)   Wt 85.2 kg   SpO2 95%   BMI 26.21 kg/m          penicillin G potassium 12 Million Units in sodium chloride 0.9 % 500 mL CONTINUOUS infusion 41.7 mL/hr at 03/21/23 1600    I/O last 3 completed shifts: In: 1421 [I.V.:3.2; IV Piggyback:1417.8] Out: 2675 [Urine:2675]      Latest Ref Rng & Units 03/21/2023    3:09 AM 03/19/2023    6:24 AM 03/18/2023    4:50 AM  CBC  WBC 4.0 - 10.5 K/uL 6.4  8.9  13.2   Hemoglobin 13.0 - 17.0 g/dL 7.6  7.4  7.6   Hematocrit 39.0 - 52.0 % 24.1  23.5  23.9   Platelets 150 - 400 K/uL 194  140  122        Latest Ref Rng & Units 03/21/2023    3:09 AM 03/20/2023    2:43 AM 03/19/2023    6:24 AM  BMP  Glucose 70 - 99 mg/dL 89  84  88   BUN 8 - 23 mg/dL 19  25  31    Creatinine 0.61 - 1.24 mg/dL 8.29  5.62  1.30   Sodium 135 - 145 mmol/L 132  131  131   Potassium 3.5 - 5.1 mmol/L 4.3  4.2  5.1   Chloride 98 - 111 mmol/L 104  103  101   CO2 22 - 32 mmol/L 23  23  20    Calcium 8.9 - 10.3 mg/dL 8.1  8.2  8.2     ABG    Component Value Date/Time   PHART 7.344 (L) 03/17/2023 0107   PCO2ART 40.8 03/17/2023 0107   PO2ART 92 03/17/2023 0107   HCO3 22.3 03/17/2023 0107   TCO2 24 03/17/2023 0107   ACIDBASEDEF 3.0 (H) 03/17/2023 0107   O2SAT 97 03/17/2023 0107       Brynda Greathouse, MD 03/21/2023 7:01 PM

## 2023-03-22 ENCOUNTER — Other Ambulatory Visit: Payer: Self-pay

## 2023-03-22 ENCOUNTER — Inpatient Hospital Stay (HOSPITAL_COMMUNITY): Payer: 59

## 2023-03-22 DIAGNOSIS — I330 Acute and subacute infective endocarditis: Secondary | ICD-10-CM | POA: Diagnosis not present

## 2023-03-22 LAB — CBC
HCT: 25.5 % — ABNORMAL LOW (ref 39.0–52.0)
Hemoglobin: 8.2 g/dL — ABNORMAL LOW (ref 13.0–17.0)
MCH: 28 pg (ref 26.0–34.0)
MCHC: 32.2 g/dL (ref 30.0–36.0)
MCV: 87 fL (ref 80.0–100.0)
Platelets: 231 10*3/uL (ref 150–400)
RBC: 2.93 MIL/uL — ABNORMAL LOW (ref 4.22–5.81)
RDW: 17.4 % — ABNORMAL HIGH (ref 11.5–15.5)
WBC: 5.9 10*3/uL (ref 4.0–10.5)
nRBC: 0 % (ref 0.0–0.2)

## 2023-03-22 LAB — PROTIME-INR
INR: 2.4 — ABNORMAL HIGH (ref 0.8–1.2)
Prothrombin Time: 26.2 s — ABNORMAL HIGH (ref 11.4–15.2)

## 2023-03-22 LAB — BASIC METABOLIC PANEL
Anion gap: 7 (ref 5–15)
BUN: 13 mg/dL (ref 8–23)
CO2: 25 mmol/L (ref 22–32)
Calcium: 8.1 mg/dL — ABNORMAL LOW (ref 8.9–10.3)
Chloride: 102 mmol/L (ref 98–111)
Creatinine, Ser: 0.6 mg/dL — ABNORMAL LOW (ref 0.61–1.24)
GFR, Estimated: >60 mL/min (ref >60)
Glucose, Bld: 108 mg/dL — ABNORMAL HIGH (ref 70–99)
Potassium: 4.2 mmol/L (ref 3.5–5.1)
Sodium: 134 mmol/L — ABNORMAL LOW (ref 135–145)

## 2023-03-22 LAB — PHOSPHORUS: Phosphorus: 3.8 mg/dL (ref 2.5–4.6)

## 2023-03-22 LAB — MAGNESIUM: Magnesium: 1.5 mg/dL — ABNORMAL LOW (ref 1.7–2.4)

## 2023-03-22 MED ORDER — WARFARIN SODIUM 2.5 MG PO TABS
2.5000 mg | ORAL_TABLET | Freq: Once | ORAL | Status: AC
  Administered 2023-03-22: 2.5 mg via ORAL
  Filled 2023-03-22: qty 1

## 2023-03-22 MED ORDER — ~~LOC~~ CARDIAC SURGERY, PATIENT & FAMILY EDUCATION
Freq: Once | Status: AC
Start: 2023-03-22 — End: 2023-03-22

## 2023-03-22 MED ORDER — IRBESARTAN 300 MG PO TABS
300.0000 mg | ORAL_TABLET | Freq: Every day | ORAL | Status: DC
Start: 2023-03-22 — End: 2023-03-26
  Administered 2023-03-22 – 2023-03-26 (×5): 300 mg via ORAL
  Filled 2023-03-22 (×5): qty 1

## 2023-03-22 MED ORDER — SODIUM CHLORIDE 0.9% FLUSH
3.0000 mL | Freq: Two times a day (BID) | INTRAVENOUS | Status: DC
  Administered 2023-03-22 – 2023-03-25 (×7): 3 mL via INTRAVENOUS

## 2023-03-22 MED ORDER — POTASSIUM CHLORIDE CRYS ER 20 MEQ PO TBCR
20.0000 meq | EXTENDED_RELEASE_TABLET | Freq: Once | ORAL | Status: AC
  Administered 2023-03-22: 20 meq via ORAL
  Filled 2023-03-22: qty 1

## 2023-03-22 MED ORDER — MORPHINE SULFATE (PF) 2 MG/ML IV SOLN
2.0000 mg | INTRAVENOUS | Status: DC | PRN
  Administered 2023-03-22 – 2023-03-24 (×9): 2 mg via INTRAVENOUS
  Filled 2023-03-22 (×9): qty 1

## 2023-03-22 MED ORDER — ENSURE ENLIVE PO LIQD
237.0000 mL | Freq: Two times a day (BID) | ORAL | Status: DC
  Administered 2023-03-23 – 2023-03-26 (×7): 237 mL via ORAL

## 2023-03-22 MED ORDER — WARFARIN SODIUM 3 MG PO TABS
3.0000 mg | ORAL_TABLET | Freq: Once | ORAL | Status: DC
Start: 2023-03-22 — End: 2023-03-22

## 2023-03-22 MED ORDER — HYDRALAZINE HCL 20 MG/ML IJ SOLN
10.0000 mg | Freq: Four times a day (QID) | INTRAMUSCULAR | Status: DC | PRN
  Administered 2023-03-22 (×2): 10 mg via INTRAVENOUS
  Filled 2023-03-22 (×2): qty 1

## 2023-03-22 MED ORDER — SODIUM CHLORIDE 0.9% FLUSH: 3.0000 mL | INTRAVENOUS | Status: DC | PRN

## 2023-03-22 MED ORDER — HYDROMORPHONE HCL 2 MG PO TABS: 2.0000 mg | ORAL_TABLET | ORAL | Status: DC | PRN

## 2023-03-22 MED ORDER — MAGNESIUM SULFATE 4 GM/100ML IV SOLN
4.0000 g | Freq: Once | INTRAVENOUS | Status: AC
Start: 2023-03-22 — End: 2023-03-22
  Administered 2023-03-22: 4 g via INTRAVENOUS
  Filled 2023-03-22: qty 100

## 2023-03-22 MED ORDER — FUROSEMIDE 40 MG PO TABS
40.0000 mg | ORAL_TABLET | Freq: Every day | ORAL | Status: DC
  Administered 2023-03-23: 40 mg via ORAL
  Filled 2023-03-22: qty 1

## 2023-03-22 NOTE — Progress Notes (Signed)
Patient arrived at the unit from 2H,pt alert and oriented X 4,chg bath given , vitals taken ,CCMD notified,no complaints of pain,patient oriented to the unit and call bell in reach

## 2023-03-22 NOTE — Progress Notes (Addendum)
      [address]       [place] [address]             [phone]      6 Days Post-Op Procedure(s) (LRB): AORTIC VALVE REPLACEMENT (AVR) USING ON-X PROSTHETIC AORTIC HEART VALVE WITH ANATOMIC SEWING RING SIZE (N/A) MITRAL VALVE (MV) REPLACEMENT USING ON-X PROSTHETIC MITRAL HEART VALVE WITH STANDARD SEWING RING SIZE 31/33MM (N/A) TRANSESOPHAGEAL ECHOCARDIOGRAM (TEE) (N/A)  Subjective:  Sitting up in chair.  Mostly having pain in his right arm.[name] He does not want oxycodone as this really makes him itch.    Objective: Vital signs in last 24 hours: Temp:  [97 F (36.1 C)-97.3 F (36.3 C)] 97 F (36.1 C) (01/23 0730) Pulse Rate:  [62-106] 104 (01/23 0735) Cardiac Rhythm: Heart block (01/23 0000) Resp:  [15-30] 20 (01/23 0735) BP: (129-201)/(60-91) 195/74 (01/23 0735) SpO2:  [90 %-100 %] 96 % (01/23 0735) Weight:  [85.2 kg] 85.2 kg (01/23 0500)  Intake/Output from previous day: 01/22 0701 - 01/23 0700 In: 1439.1 [P.O.:480; IV Piggyback:959.1] Out: 3875 [Urine:3875]  General appearance: alert, cooperative, and no distress Heart: regular rate and rhythm Lungs: clear to auscultation bilaterally Abdomen: soft, non-tender; bowel sounds normal; no masses,  no organomegaly Extremities: edema trace Wound: clean and dry  Lab Results: Recent Labs    03/21/23 0309 03/22/23 0218  WBC 6.4 5.9  HGB 7.6* 8.2*  HCT 24.1* 25.5*  PLT 194 231   BMET:  Recent Labs    03/21/23 0309 03/22/23 0218  NA 132* 134*  K 4.3 4.2  CL 104 102  CO2 23 25  GLUCOSE 89 108*  BUN 19 13  CREATININE 0.71 0.60*  CALCIUM 8.1* 8.1*    PT/INR:  Recent Labs    03/22/23 0218  LABPROT 26.2*  INR 2.4*   ABG    Component Value Date/Time   PHART 7.344 (L) 03/17/2023 0107   HCO3 22.3 03/17/2023 0107   TCO2 24 03/17/2023 0107   ACIDBASEDEF 3.0 (H) 03/17/2023 0107   O2SAT 97 03/17/2023 0107   CBG (last 3)  Recent Labs    03/19/23 1203 03/19/23 1706  GLUCAP 91 91     Assessment/Plan: S/P Procedure(s) (LRB): AORTIC VALVE REPLACEMENT (AVR) USING ON-X PROSTHETIC AORTIC HEART VALVE WITH ANATOMIC SEWING RING SIZE (N/A) MITRAL VALVE (MV) REPLACEMENT USING ON-X PROSTHETIC MITRAL HEART VALVE WITH STANDARD SEWING RING SIZE 31/33MM (N/A) TRANSESOPHAGEAL ECHOCARDIOGRAM (TEE) (N/A)  CV- NSR with 1st degree block- EP does not feel PPM is indicated at this time.... HTN overnight, does not appear this was treated with PRN Lopressor.. will continue Norvasc 10 mg daily, increase Avapro to 300 mg daily, add prn Hydralazine  INR 2.4.. evening dose of 3 mg ordered by Pharmacy Renal- creatinine has been stable, on Lasix 40 mg daily PICC Line- per nurse requesting exhange as current one is causing discomfort, poorly functioning- consulted IR ID- Endocarditis- patient will require 4 weeks ABX coverage post operatively  Dispo-will discuss wire removal today with Dr. Dorris Fetch, continue coumadin for mechanical valves..montior BP  transfer to 4E  LOS: 14 days    Lowella Dandy, PA-C' 03/22/2023 Patient seen and examined, agree with above Now in SR with long PR Will dc pacing wires Transfer to 4E 2.5 L negative yesterday, decrease Lasix to once daily Hopefully home Sat  Viviann Spare C. Dorris Fetch, MD [facility] [phone]

## 2023-03-22 NOTE — Progress Notes (Addendum)
0730 - Spoke with EP about pts 195 BP, told to speak with TCTS  0735 - Attempted to page Dr. Dorris Fetch   0740 - Attmpted to speak with [facility] PAs on unit, told to speak with Denny Peon PA  [phone] - Attempted to speak with Denny Peon PA with [facility]   [phone] - paged Angelena Form, on call PA per [facility] PA, orders placed by [facility].

## 2023-03-22 NOTE — Progress Notes (Signed)
IVT consult placed for new PIV access. Patient was assessed on 1/21 and current PIV in place was the only option available. Patient has extremely limited peripheral access given casting and current PICC in place. Lynnze, primary RN notified that alternative options will need to be explored. She reports patient complaining of pain with PIV and the IV should be removed. PICC exchange recommended for additional access options. She will reach out to provider.   At this time, no peripheral options available.  Khilynn Borntreger Loyola Mast, RN

## 2023-03-22 NOTE — Progress Notes (Signed)
Peripherally Inserted Central Catheter Exchange The IV Nurse has discussed with the patient and/or persons authorized to consent for the patient, the purpose of this procedure and the potential benefits and risks involved with this procedure.  The benefits include less needle sticks, lab draws from the catheter, and the patient may be discharged home with the catheter. Risks include, but not limited to, infection, bleeding, blood clot (thrombus formation), and puncture of an artery; nerve damage and irregular heartbeat and possibility to perform a PICC exchange if needed/ordered by physician.  Alternatives to this procedure were also discussed.  Bard Power PICC patient education guide, fact sheet on infection prevention and patient information card has been provided to patient /or left at bedside.    PICC Placement Documentation  PICC Double Lumen 03/22/23 Left Basilic 47 cm 0 cm (Active)  Indication for Insertion or Continuance of Line Prolonged intravenous therapies 03/22/23 1200  Exposed Catheter (cm) 0 cm 03/22/23 1200  Site Assessment Clean, Dry, Intact 03/22/23 1200  Lumen #1 Status Flushed;Saline locked;Blood return noted 03/22/23 1200  Lumen #2 Status Flushed;Saline locked;Blood return noted 03/22/23 1200  Dressing Type Transparent;Securing device 03/22/23 1200  Dressing Status Antimicrobial disc/dressing in place 03/22/23 1200  Line Care Connections checked and tightened 03/22/23 1200  Line Adjustment (NICU/IV Team Only) No 03/22/23 1200  Dressing Intervention New dressing;Adhesive placed at insertion site (IV team only) 03/22/23 1200  Dressing Change Due 03/29/23 03/22/23 1200       Vernona Rieger  Doninique Lwin 03/22/2023, 12:57 PM

## 2023-03-22 NOTE — Progress Notes (Signed)
Occupational Therapy Treatment and Discharge Patient Details Name: Thomas [name] MRN: [id number] DOB: [birth year] Today's Date: 03/22/2023   History of present illness 63 y.o. male admitted 03/08/23 with malaise, SOB. Workup for decompensated HF. TEE 1/13 showed severe AI with AV vegetation, severe MR with MV vegetation. S/p MVR, AVR, bilateral pleural effusion drainage 1/17. ETT -1/17. PMH includes HTN, s/p L nephrectomy, R wrist fx (s/p multiple sxs, most recently 01/2023; to be RUE NWB in sling when OOB), remote substance use.   OT comments  OT session focused on review of techniques for performing functional tasks while adhering to sternal and R UE NWB precautions with pt demonstrating good overall recall of precautions from prior training. Pt initially requiring verbal cues to recall "move in the tube" strategies for use during ADLs fading to no need for cues and pt verbalizing and demonstrating understanding Independently. Pt currently completing ADLs Independent to Contact guard assist for safety while adhering to precautions. Pt also demonstrates good carryover in training regarding use of incentive spirometer with pt demonstrating ability to Independently utilize appropriately. VSS on RA throughout session. Pt has now met all acute OT goals. No further acute or post-acute OT-related needs or equipment needs identified at this time. Pt will benefit from acute Mobility referral to encourage continued frequent mobility throughout acute hospitalization. OT signing off.       If plan is discharge home, recommend the following:  A little help with walking and/or transfers;A little help with bathing/dressing/bathroom;Assistance with cooking/housework;Help with stairs or ramp for entrance;Assist for transportation   Equipment Recommendations  None recommended by OT    Recommendations for Other Services      Precautions / Restrictions Precautions Precautions: Sternal Precaution Booklet Issued:  Yes (comment) Precaution Comments: pt and partner already have pre-op and sternal precautions handout Required Braces or Orthoses: Sling Restrictions Weight Bearing Restrictions Per Provider Order: Yes RUE Weight Bearing Per Provider Order: Non weight bearing LUE Weight Bearing Per Provider Order: Non weight bearing Other Position/Activity Restrictions: recent R wrist sx in hard cast       Mobility Bed Mobility Overal bed mobility: Modified Independent                  Transfers Overall transfer level: Needs assistance Equipment used: None (+1 assist) Transfers: Sit to/from Stand, Bed to chair/wheelchair/BSC Sit to Stand: Supervision     Step pivot transfers: Supervision, Contact guard assist     General transfer comment: assistance for line management     Balance Overall balance assessment: Needs assistance Sitting-balance support: No upper extremity supported, Feet supported Sitting balance-Leahy Scale: Good     Standing balance support: No upper extremity supported, During functional activity Standing balance-Leahy Scale: Good                             ADL either performed or assessed with clinical judgement   ADL Overall ADL's : Needs assistance/impaired Eating/Feeding: Independent;Sitting   Grooming: Supervision/safety;Contact guard assist;Standing (adhering to sternal precautions)   Upper Body Bathing: Set up;Sitting (adhering to sternal precautions)   Lower Body Bathing: Supervison/ safety;Sit to/from stand (adhering to sternal precautions and R UE NWB precautions)   Upper Body Dressing : Set up;Sitting (adhering to sternal precautions and R UE NWB precautions) Upper Body Dressing Details (indicate cue type and reason): for pull on clothing Lower Body Dressing: Supervision/safety;Sit to/from stand (adhering to sternal precautions and R UE NWB precautions) Lower Body Dressing  Details (indicate cue type and reason): for pull on  clothing Toilet Transfer: Supervision/safety;Contact guard assist;BSC/3in1 (step-pivot; without AD; adhering to sternal precautions and R UE NWB precautions)   Toileting- Clothing Manipulation and Hygiene: Supervision/safety;Sit to/from stand (adhering to sternal precautions and R UE NWB precautions)         General ADL Comments: OT reviewed sternal precautions with pt with pt demonstrating good carryover of prior training in sternal precautions. Pt initially able to recall all sternal precautions except "move in the tube" without cues. OT reviewed "move in the tube" techniques for increased safety and independence with ADLs with pt verbalizing and demonstrating understanding through teach back with no cues needed. Pt had precaution booklet at bedside and reports he has read it and will conitnue to refer to it. Pt also demonstrated good understanding of use of incentive spirometer with no cues needed for proper use. Pt reports no further OT-related questions or concerns.    Extremity/Trunk Assessment Upper Extremity Assessment Upper Extremity Assessment: Right hand dominant;RUE deficits/detail (L UE strength, ROM, and coordination WFL tested within sternal precautions) RUE Deficits / Details: R forearm/wrist cast s/p recent R ulnar shaft repair and pinning distal radial ulnar joint   Lower Extremity Assessment Lower Extremity Assessment: Defer to PT evaluation        Vision       Perception     Praxis      Cognition Arousal: Alert Behavior During Therapy: [facility] for tasks assessed/performed Overall Cognitive Status: Within Functional Limits for tasks assessed                                 General Comments: AAOx4 and pleasant throughout session. Pt with good carryover of prior training in precautions. Demonstrates ability to follow multi-step instructions consistently and demonstrates fair safety awareness and insight into deficits.        Exercises      Shoulder  Instructions       General Comments VSS on RA throughout session. RN present during a portion of session.    Pertinent Vitals/ Pain       Pain Assessment Pain Assessment: 0-10 Pain Score: 7  Pain Location: R wrist; sternal incision Pain Descriptors / Indicators: Discomfort Pain Intervention(s): Limited activity within patient's tolerance, Monitored during session, Premedicated before session, Repositioned, Patient requesting pain meds-RN notified, RN gave pain meds during session  Home Living                                          Prior Functioning/Environment              Frequency  Min 1X/week        Progress Toward Goals  OT Goals(current goals can now be found in the care plan section)  Progress towards OT goals: Goals met/education completed, patient discharged from OT  Acute Rehab OT Goals Patient Stated Goal: to return home and have less pain  Plan      Co-evaluation                 AM-PAC OT "6 Clicks" Daily Activity     Outcome Measure   Help from another person eating meals?: None Help from another person taking care of personal grooming?: A Little Help from another person toileting, which includes using toliet, bedpan, or urinal?:  A Little Help from another person bathing (including washing, rinsing, drying)?: A Little Help from another person to put on and taking off regular upper body clothing?: A Little Help from another person to put on and taking off regular lower body clothing?: A Little 6 Click Score: 19    End of Session    OT Visit Diagnosis: Unsteadiness on feet (R26.81);Muscle weakness (generalized) (M62.81);History of falling (Z91.81);Pain   Activity Tolerance Patient tolerated treatment well   Patient Left in bed;with call bell/phone within reach   Nurse Communication Mobility status;Other (comment) (Pt requesting pain medication. OT signing off.)        Time: 3016-0109 OT Time Calculation (min): 21  min  Charges: OT General Charges $OT Visit: 1 Visit OT Treatments $Self Care/Home Management : 8-22 mins  Thressa Shiffer "Orson Eva., OTR/L, MA Acute Rehab [phone]   Lendon Colonel 03/22/2023, 4:54 PM

## 2023-03-22 NOTE — Progress Notes (Signed)
[facility]   Reason for visit: Follow-up on infective endocarditis  Interval History: Remains in ICU.  No plans for a pacemaker at this time.  WBC within normal limits.  Remains afebrile  Physical Exam: Constitutional:  Vitals:   03/22/23 1045 03/22/23 1100  BP:  136/82  Pulse: 100 100  Resp: 19 (!) 21  Temp:  98 F (36.7 C)  SpO2: 96% 97%  Patient appears in no acute distress Respiratory: Normal respiratory effort  Review of Systems: Constitutional: Negative for fever and chills Lab Results  Component Value Date   WBC 5.9 03/22/2023   HGB 8.2 (L) 03/22/2023   HCT 25.5 (L) 03/22/2023   MCV 87.0 03/22/2023   PLT 231 03/22/2023    Lab Results  Component Value Date   CREATININE 0.60 (L) 03/22/2023   BUN 13 03/22/2023   NA 134 (L) 03/22/2023   K 4.2 03/22/2023   CL 102 03/22/2023   CO2 25 03/22/2023    Lab Results  Component Value Date   ALT 34 03/08/2023   AST 79 (H) 03/08/2023   ALKPHOS 96 03/08/2023     Microbiology: Recent Results (from the past 240 hours)  Surgical pcr screen     Status: None   Collection Time: 03/16/23  4:59 AM   Specimen: Nasal Mucosa; Nasal Swab  Result Value Ref Range Status   MRSA, PCR NEGATIVE NEGATIVE Final   Staphylococcus aureus NEGATIVE NEGATIVE Final    Comment: (NOTE) The Xpert SA Assay (FDA approved for NASAL specimens in patients 64 years of age and older), is one component of a comprehensive surveillance program. It is not intended to diagnose infection nor to guide or monitor treatment. Performed at [facility], [address] [place], [place] [address]   Fungus Culture With Stain     Status: None (Preliminary result)   Collection Time: 03/16/23  9:24 AM   Specimen: Mitral Valve Leaflets; Tissue  Result Value Ref Range Status   Fungus Stain Final report  Final    Comment: (NOTE) Performed At: [facility] [address] [place], [place] [id number] Jolene Schimke MD  DV:[id number]    Fungus (Mycology) Culture PENDING  Incomplete   Fungal Source VALVE  Final    Comment: Performed at [facility], [address], [place], [place] [address]  Aerobic/Anaerobic Culture w Gram Stain (surgical/deep wound)     Status: None   Collection Time: 03/16/23  9:24 AM   Specimen: Mitral Valve Leaflets; Tissue  Result Value Ref Range Status   Specimen Description VALVE  Final   Special Requests MITRAL  Final   Gram Stain   Final    RARE WBC PRESENT,BOTH PMN AND MONONUCLEAR NO ORGANISMS SEEN    Culture   Final    No growth aerobically or anaerobically. Performed at [facility], [address] [place], [place] [address]    Report Status 03/21/2023 FINAL  Final  Acid Fast Smear (AFB)     Status: None   Collection Time: 03/16/23  9:24 AM   Specimen: Mitral Valve Leaflets; Tissue  Result Value Ref Range Status   AFB Specimen Processing Comment  Final    Comment: Tissue Grinding and Digestion/Decontamination   Acid Fast Smear Negative  Final    Comment: (NOTE) Performed At: [facility] [address] [place], [place] [id number] Jolene Schimke MD HW:[id number]    Source (AFB) VALVE  Final    Comment: Performed at  [facility], [address] [place], [place], [place] [address]  Fungus Culture Result     Status: None   Collection Time: 03/16/23  9:24 AM  Result Value Ref Range Status   Result 1 Comment  Final    Comment: (NOTE) KOH/Calcofluor preparation:  no fungus observed. Performed At: [facility] [address] [place], [place] [id number] Jolene Schimke MD IE:[id number]   Fungus Culture With Stain     Status: None (Preliminary result)   Collection Time: 03/16/23 10:37 AM   Specimen: Path Tissue  Result Value Ref Range Status   Fungus Stain Final report  Final    Comment: (NOTE) Performed At: [facility] [address] [place], [place] [id number] Jolene Schimke MD FU:[id number]    Fungus (Mycology) Culture  PENDING  Incomplete   Fungal Source VALVE  Final    Comment: Performed at [facility], [address], [place], [place] [address]  Aerobic/Anaerobic Culture w Gram Stain (surgical/deep wound)     Status: None   Collection Time: 03/16/23 10:37 AM   Specimen: Path Tissue  Result Value Ref Range Status   Specimen Description VALVE  Final   Special Requests AORTIC  Final   Gram Stain   Final    RARE WBC PRESENT,BOTH PMN AND MONONUCLEAR NO ORGANISMS SEEN    Culture   Final    No growth aerobically or anaerobically. Performed at [facility], [address] [place], [place] [address]    Report Status 03/21/2023 FINAL  Final  Acid Fast Smear (AFB)     Status: None   Collection Time: 03/16/23 10:37 AM   Specimen: Path Tissue  Result Value Ref Range Status   AFB Specimen Processing Comment  Final    Comment: Tissue Grinding and Digestion/Decontamination   Acid Fast Smear Negative  Final    Comment: (NOTE) Performed At: [facility] [address] [place], [place] [id number] Jolene Schimke MD OH:[id number]    Source (AFB) VALVE  Final    Comment: Performed at [facility], [address] [place], [place] [address]  Fungus Culture Result     Status: None   Collection Time: 03/16/23 10:37 AM  Result Value Ref Range Status   Result 1 Comment  Final    Comment: (NOTE) KOH/Calcofluor preparation:  no fungus observed. Performed At: [facility] [address] [place], [place] [id number] Jolene Schimke MD FG:[id number]   Culture, blood (Routine X 2) w Reflex to ID Panel     Status: None (Preliminary result)   Collection Time: 03/19/23 12:26 PM   Specimen: BLOOD  Result Value Ref Range Status   Specimen Description BLOOD RIGHT ANTECUBITAL  Final   Special Requests   Final    BOTTLES DRAWN AEROBIC AND ANAEROBIC Blood Culture results may not be optimal due to an inadequate volume of blood received in culture bottles   Culture   Final    NO GROWTH 2  DAYS Performed at [facility], [address] [place], [place] [address]    Report Status PENDING  Incomplete  Culture, blood (Routine X 2) w Reflex to ID Panel     Status: None (Preliminary result)   Collection Time: 03/19/23 12:26 PM   Specimen: BLOOD  Result Value Ref Range Status   Specimen Description BLOOD RIGHT ANTECUBITAL  Final   Special Requests   Final    BOTTLES DRAWN AEROBIC AND ANAEROBIC Blood Culture results may not be optimal due  to an inadequate volume of blood received in culture bottles   Culture   Final    NO GROWTH 2 DAYS Performed at [facility], [address] [place], [place] [address]    Report Status PENDING  Incomplete    Impression/Plan:  1.  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  No changes to plan as outlined under OPAT.  Treatment with penicillin through February 14.  2.  Access.  He has a PICC line and to get an exchange for dual-lumen.  Will have continuous infusion of penicillin.  3.  Heart block.  No indication at this time for permanent pacemaker per EP.  4.  OPAT.  Patient will be going home with IV antibiotics as listed below.  ID team will otherwise sign off at this time.  Follow-up arranged as below.  Diagnosis: Streptococcus infective endocarditis.  Culture Result: Streptococcus infantarius  Allergies  Allergen Reactions   Hydrocodone Rash    OPAT Orders Discharge antibiotics to be given via PICC line Discharge antibiotics: IV penicillin 24,000,000 units daily as a continuous infusion Per pharmacy protocol yes Duration: 4 weeks postoperatively End Date: April 13, 2023  [facility] Care Per Protocol: Yes  Home health RN for IV administration and teaching; PICC line care and labs.    Labs weekly while on IV antibiotics: _x_ CBC with differential __ BMP _x_ CMP __ CRP __ ESR __ Vancomycin trough __ CK  _x_ Please pull PIC at completion of  IV antibiotics __ Please leave PIC in place until doctor has seen patient or been notified  Fax weekly labs to [phone]  Clinic Follow Up Appt: 2/10 with Dr. Luciana Axe  @ 2:30 pm

## 2023-03-22 NOTE — Progress Notes (Addendum)
Rounding Note    Patient Name: Thomas [name] Date of Encounter: 03/22/2023  [facility] Cardiologist: None  Subjective   OOB to chair Happy to hear his conduction continues to improve, hopeful to avoid pacer if able  Inpatient Medications    Scheduled Meds:  amLODipine  10 mg Oral Daily   atorvastatin  40 mg Oral Daily   bisacodyl  10 mg Oral Daily   Or   bisacodyl  10 mg Rectal Daily   Chlorhexidine Gluconate Cloth  6 each Topical Daily   docusate sodium  200 mg Oral BID   folic acid  1 mg Oral Daily   furosemide  40 mg Oral BID   irbesartan  300 mg Oral Daily   pantoprazole  40 mg Oral Daily   polyethylene glycol  17 g Oral Daily   potassium chloride  20 mEq Oral Once   senna  2 tablet Oral QHS   sodium chloride flush  10-40 mL Intracatheter Q12H   sodium chloride flush  3 mL Intravenous Q12H   thiamine  100 mg Oral Daily   Or   thiamine  100 mg Intravenous Daily   warfarin  3 mg Oral ONCE-1600   Warfarin - Pharmacist Dosing Inpatient   Does not apply q1600   Continuous Infusions:  magnesium sulfate bolus IVPB     penicillin G potassium 12 Million Units in sodium chloride 0.9 % 500 mL CONTINUOUS infusion 41.7 mL/hr at 03/22/23 0600   PRN Meds: diphenhydrAMINE, hydrALAZINE, LORazepam, metoprolol tartrate, morphine injection, ondansetron (ZOFRAN) IV, oxyCODONE, sodium chloride flush, sodium chloride flush, traMADol   Vital Signs    Vitals:   03/22/23 0630 03/22/23 0700 03/22/23 0730 03/22/23 0735  BP:  (!) 186/77  (!) 195/74  Pulse: (!) 106 (!) 105 (!) 102 (!) 104  Resp: (!) 24 (!) 27 (!) 23 20  Temp:   (!) 97 F (36.1 C)   TempSrc:   Axillary   SpO2: 95% 97% 94% 96%  Weight:      Height:        Intake/Output Summary (Last 24 hours) at 03/22/2023 0806 Last data filed at 03/22/2023 0630 Gross per 24 hour  Intake 1397.4 ml  Output 3875 ml  Net -2477.6 ml      03/22/2023    5:00 AM 03/21/2023    5:00 AM 03/20/2023    5:00 AM  Last 3  Weights  Weight (lbs) 187 lb 12.8 oz 187 lb 14.4 oz 187 lb 11.2 oz  Weight (kg) 85.186 kg 85.231 kg 85.14 kg      Telemetry    SR with 1:1 conduction,  1st degree AVBlock ( ) rates 70's - 90's,  blocked PACs, QRS has narroed, with some intermittent IVCD - Personally Reviewed  ECG    No new EKGs - Personally Reviewed  Physical Exam   unchanged GEN: No acute distress, thin.   Neck: No JVD Cardiac: RRR, no murmurs, rubs, or gallops.  Respiratory: CTA n/l GI: Soft, nontender, non-distended  MS: No edema; No deformity. (R wrist cast) Neuro:  Nonfocal  Psych: Normal affect   Labs    High Sensitivity Troponin:   Recent Labs  Lab 03/08/23 0811 03/08/23 1217  TROPONINIHS 27* 27*     Chemistry Recent Labs  Lab 03/20/23 0243 03/21/23 0309 03/22/23 0218  NA 131* 132* 134*  K 4.2 4.3 4.2  CL 103 104 102  CO2 23 23 25   GLUCOSE 84 89 108*  BUN 25*  19 13  CREATININE 0.73 0.71 0.60*  CALCIUM 8.2* 8.1* 8.1*  MG 1.8 1.6* 1.5*  GFRNONAA >60 >60 >60  ANIONGAP 5 5 7     Lipids  Recent Labs  Lab 03/17/23 0419  CHOL 50  TRIG 38  HDL 13*  LDLCALC 29  CHOLHDL 3.8    Hematology Recent Labs  Lab 03/19/23 0624 03/21/23 0309 03/22/23 0218  WBC 8.9 6.4 5.9  RBC 2.62* 2.72* 2.93*  HGB 7.4* 7.6* 8.2*  HCT 23.5* 24.1* 25.5*  MCV 89.7 88.6 87.0  MCH 28.2 27.9 28.0  MCHC 31.5 31.5 32.2  RDW 17.7* 17.8* 17.4*  PLT 140* 194 231   Thyroid No results for input(s): "TSH", "FREET4" in the last 168 hours.  BNPNo results for input(s): "BNP", "PROBNP" in the last 168 hours.  DDimer No results for input(s): "DDIMER" in the last 168 hours.   Radiology    No results found.   Cardiac Studies   03/16/23: inter-op TEE Complications: No known complications during this procedure. POST-OP IMPRESSIONS _ Left Ventricle: The left ventriclar function remains normal on inotropic support. _ Right Ventricle: The right ventriclar function is normal on inotropic support. _ Aorta:  The aorta appears unchanged from pre-bypass. There is no dissection _ Aortic Valve: There is a mechanical valve in the aortic position the valve is well seated with no paravalvular leak. Trace intravalvular regurgiation jets visualized consistent with normal washing jets. Mean PG . Vmax 2.8 m/s _ Mitral Valve: There is a mechanical valve in the mitral position. The valve is well seated with no paravalvular leak. Trace regurgitation jets visualized consistent with normal washing jets. _ Tricuspid Valve: The tricuspid valve appears unchanged from pre-bypass. There is mild TR. _ Pulmonic Valve: The pulmonic valve appears unchanged from pre-bypass. _ Interatrial Septum: The interatrial septum appears intact with no shunt following surgical closure. _ Pericardium: There is no pericardial effusion.    Patient Profile     63 y.o. 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 CVTS was consulted 1/14 in this setting. 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 Abx changed to pen G when bcx resulted pan sensitive   Underwent MV replacement, AV replacement, drainage of bilat pleural effusions 1/17 with CVTS.   Assessment & Plan    CHB POD #6 today Inter op TEE > post with preserved LVEF  He has regained AV conduction, occ blockerd PACs, intermittent IVCD, 1st degree AVblock Rates 70's-90's  Dr. Nelly Laurence has seen the patient this morning No plans for PPM at this juncture OK from EP perspective to move out of  ICU  Avoid nodal blocking agents    2. AFlutter  Had some rate controlled Afib 03/20/23 INR today 2.4   C/w attending/ID: VHD S/p AVR/MVR Both mechanical INR today is 2.1 again today  Bacteremia Endocarditis D/w ID yesterday >> 4 weeks IV antibiotic recommended From their perspective > OK to implant when needed BC drawn 03/19/23  >> neg so far AV/MV cultures also neg so far    For questions or updates, please contact [place]  [facility] Please consult [email or web address] for contact info under        Signed, Sheilah Pigeon, PA-C  03/22/2023, 8:06 AM

## 2023-03-22 NOTE — Progress Notes (Addendum)
PHARMACY - ANTICOAGULATION CONSULT NOTE  Pharmacy Consult for warfarin Indication: mechanical AVR and MVR  Allergies  Allergen Reactions   Hydrocodone Rash    Patient Measurements: Height: 5\' 11"  (180.3 cm) Weight: 85.2 kg (187 lb 12.8 oz) IBW/kg (Calculated) : 75.3 Heparin dosing weight: 78.6 kg   Vital Signs: BP: 165/73 (01/23 0600) Pulse Rate: 92 (01/23 0400)  Labs: Recent Labs    03/19/23 1939 03/20/23 0243 03/21/23 0309 03/22/23 0218  HGB  --   --  7.6* 8.2*  HCT  --   --  24.1* 25.5*  PLT  --   --  194 231  LABPROT  --  23.9* 23.7* 26.2*  INR  --  2.1* 2.1* 2.4*  HEPARINUNFRC <0.10* <0.10*  --   --   CREATININE  --  0.73 0.71 0.60*    Estimated Creatinine Clearance: 102 mL/min (A) (by C-G formula based on SCr of 0.6 mg/dL (L)).   Medical History: Past Medical History:  Diagnosis Date   Blood dyscrasia    GERD (gastroesophageal reflux disease)    History of kidney stones    Hypertension    Thrombocytopenia (HCC) 04/12/2016    Medications:  Medications Prior to Admission  Medication Sig Dispense Refill Last Dose/Taking   acetaminophen (TYLENOL) 650 MG CR tablet Take 1,300 mg by mouth every 8 (eight) hours as needed for pain.   Past Week   ALPRAZolam (XANAX) 1 MG tablet Take 1 mg by mouth at bedtime as needed for anxiety.   Past Week   ibuprofen (ADVIL) 800 MG tablet Take 1 tablet (800 mg total) by mouth 3 (three) times daily. Take with food (Patient taking differently: Take 200 mg by mouth every 8 (eight) hours as needed for fever, headache or mild pain (pain score 1-3). Take with food) 60 tablet 0 Taking Differently   olmesartan (BENICAR) 40 MG tablet Take 40 mg by mouth daily.   Past Week   omeprazole (PRILOSEC OTC) 20 MG tablet Take 20 mg by mouth daily.   03/07/2023   oxyCODONE (ROXICODONE) 5 MG immediate release tablet Take 1 tablet (5 mg total) by mouth every 6 (six) hours as needed. (Patient not taking: Reported on 03/08/2023) 30 tablet 0 Not Taking    DULoxetine (CYMBALTA) 60 MG capsule Take 60 mg by mouth daily. (Patient not taking: Reported on 03/08/2023)   Not Taking   Scheduled:   amLODipine  10 mg Oral Daily   atorvastatin  40 mg Oral Daily   bisacodyl  10 mg Oral Daily   Or   bisacodyl  10 mg Rectal Daily   Chlorhexidine Gluconate Cloth  6 each Topical Daily   docusate sodium  200 mg Oral BID   folic acid  1 mg Oral Daily   furosemide  40 mg Oral BID   irbesartan  150 mg Oral Daily   pantoprazole  40 mg Oral Daily   polyethylene glycol  17 g Oral Daily   senna  2 tablet Oral QHS   sodium chloride flush  10-40 mL Intracatheter Q12H   sodium chloride flush  10-40 mL Intracatheter Q12H   sodium chloride flush  3 mL Intravenous Q12H   thiamine  100 mg Oral Daily   Or   thiamine  100 mg Intravenous Daily   warfarin  3 mg Oral ONCE-1600   Warfarin - Pharmacist Dosing Inpatient   Does not apply q1600    Assessment: 63 yo male with severe MR and AVR s/p On-X MVR and  AVR 1/17. Pharmacy consulted to dose warfarin. No anticoagulants noted PTA.  INR is subtherapeutic with no changes at 2.4 (+0.3) after warfarin 5 mg x1. CBC stable and no signs of bleeding. Appetite is low but somewhat improving. Patient is not complaining of any nausea or GI irritation.  Goal of Therapy:  INR goal 2.5-3.5 (discuss with Dr. Dorris Fetch) Monitor platelets by anticoagulation protocol: Yes   Plan:  -Warfarin 2.5 mg x1 today -Monitor daily PT/INR, CBC, and signs of bleeding   Wilmer Floor, PharmD PGY2 Cardiology Pharmacy Resident 03/22/2023  7:24 AM

## 2023-03-23 ENCOUNTER — Inpatient Hospital Stay (HOSPITAL_COMMUNITY): Payer: 59

## 2023-03-23 HISTORY — PX: IR THORACENTESIS ASP PLEURAL SPACE W/IMG GUIDE: IMG5380

## 2023-03-23 LAB — CBC
HCT: 26.7 % — ABNORMAL LOW (ref 39.0–52.0)
Hemoglobin: 8.6 g/dL — ABNORMAL LOW (ref 13.0–17.0)
MCH: 27.8 pg (ref 26.0–34.0)
MCHC: 32.2 g/dL (ref 30.0–36.0)
MCV: 86.4 fL (ref 80.0–100.0)
Platelets: 343 10*3/uL (ref 150–400)
RBC: 3.09 MIL/uL — ABNORMAL LOW (ref 4.22–5.81)
RDW: 17.6 % — ABNORMAL HIGH (ref 11.5–15.5)
WBC: 8 10*3/uL (ref 4.0–10.5)
nRBC: 0 % (ref 0.0–0.2)

## 2023-03-23 LAB — BASIC METABOLIC PANEL
Anion gap: 8 (ref 5–15)
BUN: 11 mg/dL (ref 8–23)
CO2: 22 mmol/L (ref 22–32)
Calcium: 8.4 mg/dL — ABNORMAL LOW (ref 8.9–10.3)
Chloride: 103 mmol/L (ref 98–111)
Creatinine, Ser: 0.73 mg/dL (ref 0.61–1.24)
GFR, Estimated: >60 mL/min (ref >60)
Glucose, Bld: 121 mg/dL — ABNORMAL HIGH (ref 70–99)
Potassium: 4.2 mmol/L (ref 3.5–5.1)
Sodium: 133 mmol/L — ABNORMAL LOW (ref 135–145)

## 2023-03-23 LAB — PROTIME-INR
INR: 2.4 — ABNORMAL HIGH (ref 0.8–1.2)
Prothrombin Time: 26.1 s — ABNORMAL HIGH (ref 11.4–15.2)

## 2023-03-23 LAB — PHOSPHORUS: Phosphorus: 3.7 mg/dL (ref 2.5–4.6)

## 2023-03-23 LAB — MAGNESIUM: Magnesium: 1.8 mg/dL (ref 1.7–2.4)

## 2023-03-23 MED ORDER — FUROSEMIDE 40 MG PO TABS
40.0000 mg | ORAL_TABLET | Freq: Two times a day (BID) | ORAL | Status: DC
  Administered 2023-03-23 – 2023-03-26 (×6): 40 mg via ORAL
  Filled 2023-03-23 (×6): qty 1

## 2023-03-23 MED ORDER — LIDOCAINE HCL 1 % IJ SOLN
20.0000 mL | Freq: Once | INTRAMUSCULAR | Status: AC
Start: 2023-03-23 — End: 2023-03-23
  Administered 2023-03-23: 10 mL

## 2023-03-23 MED ORDER — MAGNESIUM SULFATE 2 GM/50ML IV SOLN
2.0000 g | Freq: Once | INTRAVENOUS | Status: AC
  Administered 2023-03-23: 2 g via INTRAVENOUS
  Filled 2023-03-23: qty 50

## 2023-03-23 MED ORDER — WARFARIN SODIUM 5 MG PO TABS
5.0000 mg | ORAL_TABLET | Freq: Once | ORAL | Status: AC
  Administered 2023-03-23: 5 mg via ORAL
  Filled 2023-03-23: qty 1

## 2023-03-23 MED ORDER — POTASSIUM CHLORIDE CRYS ER 20 MEQ PO TBCR
20.0000 meq | EXTENDED_RELEASE_TABLET | Freq: Once | ORAL | Status: AC
  Administered 2023-03-23: 20 meq via ORAL
  Filled 2023-03-23: qty 1

## 2023-03-23 MED ORDER — LIDOCAINE HCL 1 % IJ SOLN
INTRAMUSCULAR | Status: AC
  Filled 2023-03-23: qty 20

## 2023-03-23 MED ORDER — PENICILLIN G POTASSIUM IV (FOR PTA / DISCHARGE USE ONLY): 24.0000 10*6.[IU] | Freq: Every day | INTRAVENOUS | 0 refills | Status: AC

## 2023-03-23 NOTE — Procedures (Signed)
PROCEDURE SUMMARY:  Successful US guided left thoracentesis. Yielded 1.2L of hazy amber fluid. Patient tolerated procedure well. No immediate complications. EBL = trace  Specimen not sent for labs.  Post procedure chest X-ray reveals no pneumothorax  Nazire Fruth Charmian Muff PA-C 03/23/2023 12:02 PM

## 2023-03-23 NOTE — TOC Progression Note (Addendum)
Transition of Care [facility]) - Progression Note    Patient Details  Name: Thomas [name] MRN: [id number] Date of Birth: [birth year]  Transition of Care [facility]) CM/SW Contact  Elliot Cousin, RN Phone Number: [phone] 03/23/2023, 12:56 PM  Clinical Narrative:     CM received notification from [facility], Pam, they are unable to accept insurance. Provided info for [facility] or [facility]. Unit TOC CM will follow up with options and refer to agency that can accept pt's insurance for Home IV abx.   Chip Boer with [facility] can be reached for assistance with discharge planning needs or barriers her number is [phone]  Expected Discharge Plan: IP Rehab Facility Barriers to Discharge: Continued Medical Work up  Expected Discharge Plan and Services In-house Referral: NA Discharge Planning Services: CM Consult Post Acute Care Choice: Home Health Living arrangements for the past 2 months: Single Family Home                   DME Agency: NA       HH Arranged: RN           Social Determinants of Health (SDOH) Interventions SDOH Screenings   Food Insecurity: No Food Insecurity (03/10/2023)  Housing: Low Risk  (03/10/2023)  Transportation Needs: No Transportation Needs (03/10/2023)  Utilities: Not At Risk (03/10/2023)  Social Connections: Socially Integrated (03/10/2023)  Tobacco Use: Low Risk  (03/16/2023)    Readmission Risk Interventions     No data to display

## 2023-03-23 NOTE — Progress Notes (Signed)
      [address]       [place] [address]             [phone]      Patients significant other contacted in regards to discharge plan.  She is upset that the patient is being discharged home.  She states she works full time and will not be with the patient at time of discharge full time.  She states he will also likely not be compliant with medications or recovery instructions.  I explained to her that unfortunately he is not a candidate for SNF placement or CIR.  He is doing very well by Therapy standards and meets requirements for home discharge.  I explained that a nurse will come to teach how to administer IV antibiotics however they will not completed the full course and the patient and her will be taught how to administer.  I explained he will also need transportation to appointments.  She did ask if he can wait until Sunday to be discharged so she can ensure the house is ready.  Lowella Dandy, PA-C 9:50 AM 03/23/23

## 2023-03-23 NOTE — Progress Notes (Signed)
PHARMACY - ANTICOAGULATION CONSULT NOTE  Pharmacy Consult for warfarin Indication: mechanical AVR and MVR  Allergies  Allergen Reactions   Hydrocodone Rash    Patient Measurements: Height: 5\' 11"  (180.3 cm) Weight: 82.9 kg (182 lb 12.2 oz) IBW/kg (Calculated) : 75.3 Heparin dosing weight: 78.6 kg   Vital Signs: Temp: 97.5 F (36.4 C) (01/24 0821) Temp Source: Oral (01/24 0821) BP: 140/60 (01/24 0849) Pulse Rate: 83 (01/24 0842)  Labs: Recent Labs    03/21/23 0309 03/22/23 0218 03/23/23 0355  HGB 7.6* 8.2* 8.6*  HCT 24.1* 25.5* 26.7*  PLT 194 231 343  LABPROT 23.7* 26.2* 26.1*  INR 2.1* 2.4* 2.4*  CREATININE 0.71 0.60* 0.73    Estimated Creatinine Clearance: 102 mL/min (by C-G formula based on SCr of 0.73 mg/dL).   Medical History: Past Medical History:  Diagnosis Date   Blood dyscrasia    GERD (gastroesophageal reflux disease)    History of kidney stones    Hypertension    Thrombocytopenia (HCC) 04/12/2016    Medications:  Medications Prior to Admission  Medication Sig Dispense Refill Last Dose/Taking   acetaminophen (TYLENOL) 650 MG CR tablet Take 1,300 mg by mouth every 8 (eight) hours as needed for pain.   Past Week   ALPRAZolam (XANAX) 1 MG tablet Take 1 mg by mouth at bedtime as needed for anxiety.   Past Week   ibuprofen (ADVIL) 800 MG tablet Take 1 tablet (800 mg total) by mouth 3 (three) times daily. Take with food (Patient taking differently: Take 200 mg by mouth every 8 (eight) hours as needed for fever, headache or mild pain (pain score 1-3). Take with food) 60 tablet 0 Taking Differently   olmesartan (BENICAR) 40 MG tablet Take 40 mg by mouth daily.   Past Week   omeprazole (PRILOSEC OTC) 20 MG tablet Take 20 mg by mouth daily.   03/07/2023   oxyCODONE (ROXICODONE) 5 MG immediate release tablet Take 1 tablet (5 mg total) by mouth every 6 (six) hours as needed. (Patient not taking: Reported on 03/08/2023) 30 tablet 0 Not Taking   DULoxetine  (CYMBALTA) 60 MG capsule Take 60 mg by mouth daily. (Patient not taking: Reported on 03/08/2023)   Not Taking   Scheduled:   amLODipine  10 mg Oral Daily   atorvastatin  40 mg Oral Daily   bisacodyl  10 mg Oral Daily   Or   bisacodyl  10 mg Rectal Daily   Chlorhexidine Gluconate Cloth  6 each Topical Daily   docusate sodium  200 mg Oral BID   feeding supplement  237 mL Oral BID BM   folic acid  1 mg Oral Daily   furosemide  40 mg Oral Daily   irbesartan  300 mg Oral Daily   pantoprazole  40 mg Oral Daily   polyethylene glycol  17 g Oral Daily   senna  2 tablet Oral QHS   sodium chloride flush  10-40 mL Intracatheter Q12H   sodium chloride flush  3 mL Intravenous Q12H   thiamine  100 mg Oral Daily   Or   thiamine  100 mg Intravenous Daily   warfarin  5 mg Oral ONCE-1600   Warfarin - Pharmacist Dosing Inpatient   Does not apply q1600    Assessment: 63 yo male with severe MR and AR 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 Pharmacy consulted to dose warfarin. No anticoagulants noted PTA.  INR is subtherapeutic with no changes at 2.4 (+/- 0) after warfarin 2.5 mg  x1. CBC stable and no signs of bleeding. Appetite is low but somewhat improving. Patient is not complaining of any nausea or GI irritation.  Goal of Therapy:  INR goal 2.5-3.5 (discuss with Dr. Dorris Fetch) Monitor platelets by anticoagulation protocol: Yes   Plan:  -Warfarin 5 mg x1 today -Monitor daily PT/INR, CBC, and signs of bleeding   Wilmer Floor, PharmD PGY2 Cardiology Pharmacy Resident 03/23/2023  8:56 AM

## 2023-03-23 NOTE — TOC Progression Note (Signed)
Transition of Care [facility]) - Progression Note    Patient Details  Name: Thomas [name] MRN: [id number] Date of Birth: 08-18-1960  Transition of Care [facility]) CM/SW Contact  Elliot Cousin, RN Phone Number: [phone] 03/23/2023, 4:56 PM  Clinical Narrative:     Received call from [facility] rep, Sarah. States she needs to speak to pt to complete referral for Home IV abx # [id number]. Message sent to his Unit RN to provide contact number to patient.   Expected Discharge Plan: Home w Home Health Services Barriers to Discharge: Continued Medical Work up  Expected Discharge Plan and Services In-house Referral: NA Discharge Planning Services: CM Consult Post Acute Care Choice: Home Health Living arrangements for the past 2 months: Single Family Home                 DME Arranged: Walker rolling with seat DME Agency: [facility] Date DME Agency Contacted: 03/22/23   Representative spoke with at DME Agency: Zollie Beckers [facility] Arranged: RN [facility] Agency: Other - See comment ([facility]) Date HH Agency Contacted: 03/23/23 Time HH Agency Contacted: 1300 Representative spoke with at [facility] Agency: Psychiatric nurse   Social Determinants of Health (SDOH) Interventions SDOH Screenings   Food Insecurity: No Food Insecurity (03/10/2023)  Housing: Low Risk  (03/10/2023)  Transportation Needs: No Transportation Needs (03/10/2023)  Utilities: Not At Risk (03/10/2023)  Social Connections: Socially Integrated (03/10/2023)  Tobacco Use: Low Risk  (03/16/2023)    Readmission Risk Interventions     No data to display

## 2023-03-23 NOTE — Progress Notes (Signed)
Patient back from IR after having thoracentesis,vitals checked ,pt state his breathing is much better, incision site level 0

## 2023-03-23 NOTE — Progress Notes (Signed)
Telemetry reviewed with Dr. Nelly Laurence Conduction when in sinus remains better, 1:1, Mobitz One as well as some blocked PACs, without bradycardia Has settled into a rate controlled Afib over night. Not uncommon post-op  Avoid amiodarone/nodal blocking agents AFib may resolve as he continues to  recover. INR 2.4 today  At this juncture, no plans for PPM implant Has cards follow up in place  We will sign off though remain available Please recall if needed  Francis Dowse, PA-C

## 2023-03-23 NOTE — TOC Progression Note (Addendum)
Transition of Care (TOC) - Progression Note  Sander Radon, BSN Transitions of Care Unit 4E- RN Case Manager See Treatment Team for direct phone #   Patient Details  Name: Thomas [name] MRN: [id number] Date of Birth: 1960-05-14  Transition of Care [facility]) CM/SW Contact  Zenda Alpers Lenn Sink, RN Phone Number: 03/23/2023, 2:53 PM  Clinical Narrative:    Follow up done for home IV abx needs- PCN-G w/ end date 2/17   CM has confirmed [facility] can accept referral- spoke with Florentina Addison- [facility] RN-Nadine will be by this afternoon to do bedside education with pt- they are tying to reach SO (caregiver) as well to provide education if needed.   [facility] to follow for home PCN-G needs- continuous infusion- they will provide weekly RN visit for PICC line care and lab draw needs.  1530- [facility] has confirmed successful teach with pt at bedside- RN left PICC extension at bedside with not for it to be attached before he discharges.   Weekend CM will need to call [phone]- to confirm d/c tomorrow or Sun. So that home med can be delivered to the home for pt to hook up   CM has confirmed that DME- rollator has been delivered to room.   EDD this weekend.    Expected Discharge Plan: Home w Home Health Services Barriers to Discharge: Continued Medical Work up  Expected Discharge Plan and Services In-house Referral: NA Discharge Planning Services: CM Consult Post Acute Care Choice: Home Health Living arrangements for the past 2 months: Single Family Home                 DME Arranged: Walker rolling with seat DME Agency: [facility] Date DME Agency Contacted: 03/22/23   Representative spoke with at DME Agency: Zollie Beckers [facility] Arranged: RN [facility] Agency: Other - See comment ([facility]) Date HH Agency Contacted: 03/23/23 Time HH Agency Contacted: 1300 Representative spoke with at [facility] Agency: Psychiatric nurse   Social Determinants of Health (SDOH) Interventions SDOH Screenings    Food Insecurity: No Food Insecurity (03/10/2023)  Housing: Low Risk  (03/10/2023)  Transportation Needs: No Transportation Needs (03/10/2023)  Utilities: Not At Risk (03/10/2023)  Social Connections: Socially Integrated (03/10/2023)  Tobacco Use: Low Risk  (03/16/2023)    Readmission Risk Interventions     No data to display

## 2023-03-23 NOTE — TOC Progression Note (Signed)
Transition of Care [facility]) - Progression Note    Patient Details  Name: Thomas [name] MRN: [id number] Date of Birth: Nov 18, 1960  Transition of Care [facility]) CM/SW Contact  Elliot Cousin, RN Phone Number: [phone] 03/23/2023, 12:59 PM  Clinical Narrative:     TOC CM spoke to pt and offered choice for [facility] for Home IV abx. (Medicare.gov list placed on chart and provide to pt). Pt agreeable to agency that will accept his plan. Pt states he lives at home with is SO and she work full-time but will assist with IV abx at home. Pt states he will need Rollator for home. Contacted Emily Filbert for [employer] for home.   Expected Discharge Plan: Home w Home Health Services Barriers to Discharge: Continued Medical Work up  Expected Discharge Plan and Services In-house Referral: NA Discharge Planning Services: CM Consult Post Acute Care Choice: Home Health Living arrangements for the past 2 months: Single Family Home                   DME Agency: NA       HH Arranged: RN           Social Determinants of Health (SDOH) Interventions SDOH Screenings   Food Insecurity: No Food Insecurity (03/10/2023)  Housing: Low Risk  (03/10/2023)  Transportation Needs: No Transportation Needs (03/10/2023)  Utilities: Not At Risk (03/10/2023)  Social Connections: Socially Integrated (03/10/2023)  Tobacco Use: Low Risk  (03/16/2023)    Readmission Risk Interventions     No data to display

## 2023-03-23 NOTE — Progress Notes (Signed)
The patient's wife wants the doctors and case manager to call her today about the patient's discharge plan. The wife is concerned because she has to work during the week and is worried about the patient being alone.  Harriet Masson, RN

## 2023-03-23 NOTE — Progress Notes (Addendum)
      [address]       [place] [address]             [phone]      7 Days Post-Op Procedure(s) (LRB): AORTIC VALVE REPLACEMENT (AVR) USING ON-X PROSTHETIC AORTIC HEART VALVE WITH ANATOMIC SEWING RING SIZE (N/A) MITRAL VALVE (MV) REPLACEMENT USING ON-X PROSTHETIC MITRAL HEART VALVE WITH STANDARD SEWING RING SIZE 31/33MM (N/A) TRANSESOPHAGEAL ECHOCARDIOGRAM (TEE) (N/A)  Subjective:  Patient sitting up eating breakfast.  Biggest complaint remains pain in his right arm.  + ambulation   +BM  Objective: Vital signs in last 24 hours: Temp:  [97.5 F (36.4 C)-98.4 F (36.9 C)] 98.4 F (36.9 C) (01/24 0419) Pulse Rate:  [85-108] 85 (01/24 0419) Cardiac Rhythm: Normal sinus rhythm;Heart block (01/23 1900) Resp:  [13-24] 18 (01/24 0419) BP: (109-164)/(52-82) 139/65 (01/24 0419) SpO2:  [90 %-100 %] 95 % (01/24 0419) Weight:  [82.9 kg] 82.9 kg (01/24 0337)  Intake/Output from previous day: 01/23 0701 - 01/24 0700 In: 1056.1 [I.V.:20; IV Piggyback:1036.1] Out: 1275 [Urine:1275]  General appearance: alert, cooperative, and no distress Heart: irregularly irregular rhythm Lungs: clear to auscultation bilaterally Abdomen: soft, non-tender; bowel sounds normal; no masses,  no organomegaly Extremities: edema mild +1 pitting Wound: clean and dry  Lab Results: Recent Labs    03/22/23 0218 03/23/23 0355  WBC 5.9 8.0  HGB 8.2* 8.6*  HCT 25.5* 26.7*  PLT 231 343   BMET:  Recent Labs    03/22/23 0218 03/23/23 0355  NA 134* 133*  K 4.2 4.2  CL 102 103  CO2 25 22  GLUCOSE 108* 121*  BUN 13 11  CREATININE 0.60* 0.73  CALCIUM 8.1* 8.4*    PT/INR:  Recent Labs    03/23/23 0355  LABPROT 26.1*  INR 2.4*   ABG    Component Value Date/Time   PHART 7.344 (L) 03/17/2023 0107   HCO3 22.3 03/17/2023 0107   TCO2 24 03/17/2023 0107   ACIDBASEDEF 3.0 (H) 03/17/2023 0107   O2SAT 97 03/17/2023 0107   CBG (last 3)  No results for input(s): "GLUCAP" in  the last 72 hours.  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 on PCN G per ID recommendations will require 4 weeks post operatively Dispo- patient remains clinically stable, continue current care, if no further rhythm issues, planning for d/c in AM   LOS: 15 days    Lowella Dandy, PA-C 03/23/2023 Patient seen and examined, agree with above CXR shows a moderate left effusion and small right effusion, bibasilar atelectasis.  Will see if IR can drain the left effusion Continue diuresis,increase Lasix back to 40 mg BID  Viviann Spare C. Dorris Fetch, MD [facility] [phone]

## 2023-03-24 ENCOUNTER — Inpatient Hospital Stay (HOSPITAL_COMMUNITY): Payer: 59

## 2023-03-24 LAB — CBC
HCT: 23.3 % — ABNORMAL LOW (ref 39.0–52.0)
Hemoglobin: 7.5 g/dL — ABNORMAL LOW (ref 13.0–17.0)
MCH: 27.6 pg (ref 26.0–34.0)
MCHC: 32.2 g/dL (ref 30.0–36.0)
MCV: 85.7 fL (ref 80.0–100.0)
Platelets: 226 10*3/uL (ref 150–400)
RBC: 2.72 MIL/uL — ABNORMAL LOW (ref 4.22–5.81)
RDW: 17.3 % — ABNORMAL HIGH (ref 11.5–15.5)
WBC: 5.4 10*3/uL (ref 4.0–10.5)
nRBC: 0 % (ref 0.0–0.2)

## 2023-03-24 LAB — PHOSPHORUS: Phosphorus: 3.8 mg/dL (ref 2.5–4.6)

## 2023-03-24 LAB — BASIC METABOLIC PANEL
Anion gap: 5 (ref 5–15)
BUN: 10 mg/dL (ref 8–23)
CO2: 22 mmol/L (ref 22–32)
Calcium: 8 mg/dL — ABNORMAL LOW (ref 8.9–10.3)
Chloride: 103 mmol/L (ref 98–111)
Creatinine, Ser: 0.61 mg/dL (ref 0.61–1.24)
GFR, Estimated: >60 mL/min (ref >60)
Glucose, Bld: 95 mg/dL (ref 70–99)
Potassium: 4.1 mmol/L (ref 3.5–5.1)
Sodium: 130 mmol/L — ABNORMAL LOW (ref 135–145)

## 2023-03-24 LAB — CULTURE, BLOOD (ROUTINE X 2)
Culture: NO GROWTH
Culture: NO GROWTH

## 2023-03-24 LAB — PROTIME-INR
INR: 2.5 — ABNORMAL HIGH (ref 0.8–1.2)
Prothrombin Time: 27.5 s — ABNORMAL HIGH (ref 11.4–15.2)

## 2023-03-24 LAB — MAGNESIUM: Magnesium: 1.5 mg/dL — ABNORMAL LOW (ref 1.7–2.4)

## 2023-03-24 MED ORDER — WARFARIN SODIUM 5 MG PO TABS
5.0000 mg | ORAL_TABLET | Freq: Once | ORAL | Status: AC
  Administered 2023-03-24: 5 mg via ORAL
  Filled 2023-03-24: qty 1

## 2023-03-24 MED ORDER — ACETAMINOPHEN 325 MG PO TABS
650.0000 mg | ORAL_TABLET | ORAL | Status: DC | PRN
  Administered 2023-03-24 – 2023-03-26 (×6): 650 mg via ORAL
  Filled 2023-03-24 (×6): qty 2

## 2023-03-24 MED ORDER — MAGNESIUM OXIDE -MG SUPPLEMENT 400 (240 MG) MG PO TABS
400.0000 mg | ORAL_TABLET | Freq: Two times a day (BID) | ORAL | Status: DC
  Administered 2023-03-24 – 2023-03-26 (×5): 400 mg via ORAL
  Filled 2023-03-24 (×5): qty 1

## 2023-03-24 NOTE — Progress Notes (Addendum)
      [address]       [place] [address]             [phone]      8 Days Post-Op Procedure(s) (LRB): AORTIC VALVE REPLACEMENT (AVR) USING ON-X PROSTHETIC AORTIC HEART VALVE WITH ANATOMIC SEWING RING SIZE (N/A) MITRAL VALVE (MV) REPLACEMENT USING ON-X PROSTHETIC MITRAL HEART VALVE WITH STANDARD SEWING RING SIZE 31/33MM (N/A) TRANSESOPHAGEAL ECHOCARDIOGRAM (TEE) (N/A)  Subjective:  Patient awoken from sleep.  No new complaints.    Objective: Vital signs in last 24 hours: Temp:  [97.5 F (36.4 C)-98.9 F (37.2 C)] 98.7 F (37.1 C) (01/25 0740) Pulse Rate:  [76-100] 76 (01/25 0740) Cardiac Rhythm: Normal sinus rhythm (01/24 1900) Resp:  [15-24] 15 (01/25 0740) BP: (121-154)/(45-67) 153/67 (01/25 0740) SpO2:  [93 %-100 %] 100 % (01/25 0740) Weight:  [83.4 kg] 83.4 kg (01/25 0321)  Intake/Output from previous day: 01/24 0701 - 01/25 0700 In: 2025.4 [P.Thomas.:960; IV Piggyback:1065.4] Out: 2080 [Urine:2080] Intake/Output this shift: Total I/Thomas In: -  Out: 300 [Urine:300]  General appearance: alert, cooperative, and no distress Heart: irregularly irregular rhythm Lungs: diminished breath sounds bibasilar Abdomen: soft, non-tender; bowel sounds normal; no masses,  no organomegaly Extremities: edema trace Wound: clean and dry  Lab Results: Recent Labs    03/23/23 0355 03/24/23 0355  WBC 8.0 5.4  HGB 8.6* 7.5*  HCT 26.7* 23.3*  PLT 343 226   BMET:  Recent Labs    03/23/23 0355 03/24/23 0355  NA 133* 130*  K 4.2 4.1  CL 103 103  CO2 22 22  GLUCOSE 121* 95  BUN 11 10  CREATININE 0.73 0.61  CALCIUM 8.4* 8.0*    PT/INR:  Recent Labs    03/24/23 0355  LABPROT 27.5*  INR 2.5*   ABG    Component Value Date/Time   PHART 7.344 (L) 03/17/2023 0107   HCO3 22.3 03/17/2023 0107   TCO2 24 03/17/2023 0107   ACIDBASEDEF 3.0 (H) 03/17/2023 0107   O2SAT 97 03/17/2023 0107   CBG (last 3)  No results for input(s): "GLUCAP" in the last 72  hours.  Assessment/Plan: S/P Procedure(s) (LRB): AORTIC VALVE REPLACEMENT (AVR) USING ON-X PROSTHETIC AORTIC HEART VALVE WITH ANATOMIC SEWING RING SIZE (N/A) MITRAL VALVE (MV) REPLACEMENT USING ON-X PROSTHETIC MITRAL HEART VALVE WITH STANDARD SEWING RING SIZE 31/33MM (N/A) TRANSESOPHAGEAL ECHOCARDIOGRAM (TEE) (N/A)  CV- A. Fib, rate is controlled, HTN- continue Avapro, Norvasc INR 2.5 continue coumadin at 5 mg daily Pulm- S/P Thoracentesis with removal of 1200 cc fluid.. f/u CXR with trace bilateral pleural effusions/atelectasis.[name] continue IS Renal- creatinine is stable, weight is stable.. on Lasix 40 mg BID.[name] will decrease to daily at discharge Endocarditis- OR cultures remain negative, continue IV ABX per ID recommendations Dispo- patient stable, making good progress, continue coumadin for mechanical valves, ABX.[name] spoke with significant other yesterday she is getting house ready for d/c.[name] if remains clinically stable for discharge in AM   LOS: 16 days    Thomas Dandy, PA-C 03/24/2023  Agree with above Dispo planning  Thomas [name] Thomas [name]

## 2023-03-24 NOTE — TOC Transition Note (Signed)
Transition of Care [facility]) - Discharge Note   Patient Details  Name: Thomas [name] MRN: [id number] Date of Birth: [birth year]  Transition [facility] CM/SW Contact:  Ronny Bacon, RN Phone Number: 03/24/2023, 1:34 PM   Clinical Narrative:  Secure message received from floor RN that Renee for [facility] called to report that they are unable to locate [facility] RN services for labs and line care. Referral to Amy with [facility] made, unable to start care until Thursday. Provider amd floor nurse made aware.    Final next level of care: Home w Home Health Services Barriers to Discharge: Continued Medical Work up   Patient Goals and CMS Choice Patient states their goals for this hospitalization and ongoing recovery are:: wants to recover CMS Medicare.gov Compare Post Acute Care list provided to:: Patient Choice offered to / list presented to : Patient      Discharge Placement                       Discharge Plan and Services Additional resources added to the After Visit Summary for   In-house Referral: NA Discharge Planning Services: CM Consult Post Acute Care Choice: Home Health          DME Arranged: Walker rolling with seat DME Agency: [facility] Date DME Agency Contacted: 03/22/23   Representative spoke with at DME Agency: Zollie [facility] Arranged: RN [facility] Agency: Other - See comment ([facility]) Date HH Agency Contacted: 03/23/23 Time HH Agency Contacted: 1300 Representative spoke with at [facility] Agency: Katie  Social Drivers of Health (SDOH) Interventions SDOH Screenings   Food Insecurity: No Food Insecurity (03/10/2023)  Housing: Low Risk  (03/10/2023)  Transportation Needs: No Transportation Needs (03/10/2023)  Utilities: Not At Risk (03/10/2023)  Social Connections: Socially Integrated (03/10/2023)  Tobacco Use: Low Risk  (03/16/2023)     Readmission Risk Interventions     No data to display

## 2023-03-24 NOTE — Progress Notes (Signed)
Electrophysiology  Telemetry reviewed  AV conduction remains adequate with sinus rhythm and PACs overnight with 1:1 conduction. AF recurred this AM with controlled V-rates.   Would continue to avoid amiodarone and AV node blocking agents for now.   Please call with any questions or concerns.

## 2023-03-24 NOTE — Progress Notes (Signed)
PHARMACY - ANTICOAGULATION CONSULT NOTE  Pharmacy Consult for warfarin Indication: mechanical AVR and MVR  Allergies  Allergen Reactions   Hydrocodone Rash    Patient Measurements: Height: 5\' 11"  (180.3 cm) Weight: 83.4 kg (183 lb 13.8 oz) IBW/kg (Calculated) : 75.3 Heparin dosing weight: 78.6 kg   Vital Signs: Temp: 98.7 F (37.1 C) (01/25 0740) Temp Source: Oral (01/25 0740) BP: 153/67 (01/25 0740) Pulse Rate: 76 (01/25 0740)  Labs: Recent Labs    03/22/23 0218 03/23/23 0355 03/24/23 0355  HGB 8.2* 8.6* 7.5*  HCT 25.5* 26.7* 23.3*  PLT 231 343 226  LABPROT 26.2* 26.1* 27.5*  INR 2.4* 2.4* 2.5*  CREATININE 0.60* 0.73 0.61    Estimated Creatinine Clearance: 102 mL/min (by C-G formula based on SCr of 0.61 mg/dL).   Medical History: Past Medical History:  Diagnosis Date   Blood dyscrasia    GERD (gastroesophageal reflux disease)    History of kidney stones    Hypertension    Thrombocytopenia (HCC) 04/12/2016    Medications:  Medications Prior to Admission  Medication Sig Dispense Refill Last Dose/Taking   acetaminophen (TYLENOL) 650 MG CR tablet Take 1,300 mg by mouth every 8 (eight) hours as needed for pain.   Past Week   ALPRAZolam (XANAX) 1 MG tablet Take 1 mg by mouth at bedtime as needed for anxiety.   Past Week   ibuprofen (ADVIL) 800 MG tablet Take 1 tablet (800 mg total) by mouth 3 (three) times daily. Take with food (Patient taking differently: Take 200 mg by mouth every 8 (eight) hours as needed for fever, headache or mild pain (pain score 1-3). Take with food) 60 tablet 0 Taking Differently   olmesartan (BENICAR) 40 MG tablet Take 40 mg by mouth daily.   Past Week   omeprazole (PRILOSEC OTC) 20 MG tablet Take 20 mg by mouth daily.   03/07/2023   oxyCODONE (ROXICODONE) 5 MG immediate release tablet Take 1 tablet (5 mg total) by mouth every 6 (six) hours as needed. (Patient not taking: Reported on 03/08/2023) 30 tablet 0 Not Taking   DULoxetine  (CYMBALTA) 60 MG capsule Take 60 mg by mouth daily. (Patient not taking: Reported on 03/08/2023)   Not Taking   Scheduled:   amLODipine  10 mg Oral Daily   atorvastatin  40 mg Oral Daily   bisacodyl  10 mg Oral Daily   Or   bisacodyl  10 mg Rectal Daily   Chlorhexidine Gluconate Cloth  6 each Topical Daily   docusate sodium  200 mg Oral BID   feeding supplement  237 mL Oral BID BM   folic acid  1 mg Oral Daily   furosemide  40 mg Oral BID   irbesartan  300 mg Oral Daily   pantoprazole  40 mg Oral Daily   polyethylene glycol  17 g Oral Daily   senna  2 tablet Oral QHS   sodium chloride flush  10-40 mL Intracatheter Q12H   sodium chloride flush  3 mL Intravenous Q12H   thiamine  100 mg Oral Daily   Or   thiamine  100 mg Intravenous Daily   warfarin  5 mg Oral ONCE-1600   Warfarin - Pharmacist Dosing Inpatient   Does not apply q1600    Assessment: 63 yo male with severe MR and AR 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 Pharmacy consulted to dose warfarin. No anticoagulants noted PTA.  INR is therapeutic at 2.5. Hgb low stable 7-8s and platelets are stable. No  signs of bleeding.   Goal of Therapy:  INR goal 2.5-3.5 (discuss with Dr. Dorris Fetch) Monitor platelets by anticoagulation protocol: Yes   Plan:  -Warfarin 5 mg PO x1 was ordered by TCTS PA -Monitor daily PT/INR, CBC, and signs of bleeding  Thank you for involving pharmacy in this patient's care.  Loura Back, PharmD, BCPS Clinical Pharmacist Clinical phone for 03/24/2023 is [phone] 03/24/2023 12:01 PM

## 2023-03-24 NOTE — Progress Notes (Signed)
CARDIAC REHAB PHASE I   Pt received in bed eating breakfast on the phone with significant other [name], asking several questions regarding discharge and requesting to speak with PA as he is eager to discharge but is confused about timing. Shared with RN who said she would message provider.   Pt declined walk due to eating and stating he needed to make other phone calls, agreeable to education. Pt educated on risk factors, sternal precautions, exercise guidelines, restrictions, nutrition, and CRP2 referral to AP.    Pt requested to use bathroom, exited bed following sternal precautions well and ambulated to bathroom for BM with assistance pushing IV pole. Pt returned to bed without any c/o or s/sx.    Thomas Coup, MS, ACSM-CEP 03/24/2023 9:32 AM [phone]

## 2023-03-24 NOTE — Plan of Care (Signed)
Problem: Education: Goal: Knowledge of General Education information will improve Description: Including pain rating scale, medication(s)/side effects and non-pharmacologic comfort measures Outcome: Progressing   Problem: Clinical Measurements: Goal: Ability to maintain clinical measurements within normal limits will improve Outcome: Progressing

## 2023-03-25 LAB — PROTIME-INR
INR: 2.7 — ABNORMAL HIGH (ref 0.8–1.2)
Prothrombin Time: 28.9 s — ABNORMAL HIGH (ref 11.4–15.2)

## 2023-03-25 LAB — CBC
HCT: 24.3 % — ABNORMAL LOW (ref 39.0–52.0)
Hemoglobin: 7.8 g/dL — ABNORMAL LOW (ref 13.0–17.0)
MCH: 27.6 pg (ref 26.0–34.0)
MCHC: 32.1 g/dL (ref 30.0–36.0)
MCV: 85.9 fL (ref 80.0–100.0)
Platelets: 245 10*3/uL (ref 150–400)
RBC: 2.83 MIL/uL — ABNORMAL LOW (ref 4.22–5.81)
RDW: 17.2 % — ABNORMAL HIGH (ref 11.5–15.5)
WBC: 5.8 10*3/uL (ref 4.0–10.5)
nRBC: 0 % (ref 0.0–0.2)

## 2023-03-25 MED ORDER — WARFARIN SODIUM 5 MG PO TABS
5.0000 mg | ORAL_TABLET | Freq: Every day | ORAL | Status: DC
  Administered 2023-03-25: 5 mg via ORAL
  Filled 2023-03-25: qty 1

## 2023-03-25 NOTE — Progress Notes (Signed)
PHARMACY - ANTICOAGULATION CONSULT NOTE  Pharmacy Consult for warfarin Indication: mechanical AVR and MVR  Allergies  Allergen Reactions   Hydrocodone Rash    Patient Measurements: Height: 5\' 11"  (180.3 cm) Weight: 83.4 kg (183 lb 13.8 oz) IBW/kg (Calculated) : 75.3 Heparin dosing weight: 78.6 kg   Vital Signs: Temp: 97.8 F (36.6 C) (01/26 0824) Temp Source: Oral (01/26 0824) BP: 143/80 (01/26 0824) Pulse Rate: 86 (01/26 0824)  Labs: Recent Labs    03/23/23 0355 03/24/23 0355 03/25/23 0511  HGB 8.6* 7.5* 7.8*  HCT 26.7* 23.3* 24.3*  PLT 343 226 245  LABPROT 26.1* 27.5* 28.9*  INR 2.4* 2.5* 2.7*  CREATININE 0.73 0.61  --     Estimated Creatinine Clearance: 102 mL/min (by C-G formula based on SCr of 0.61 mg/dL).   Medical History: Past Medical History:  Diagnosis Date   Blood dyscrasia    GERD (gastroesophageal reflux disease)    History of kidney stones    Hypertension    Thrombocytopenia (HCC) 04/12/2016    Medications:  Medications Prior to Admission  Medication Sig Dispense Refill Last Dose/Taking   acetaminophen (TYLENOL) 650 MG CR tablet Take 1,300 mg by mouth every 8 (eight) hours as needed for pain.   Past Week   ALPRAZolam (XANAX) 1 MG tablet Take 1 mg by mouth at bedtime as needed for anxiety.   Past Week   ibuprofen (ADVIL) 800 MG tablet Take 1 tablet (800 mg total) by mouth 3 (three) times daily. Take with food (Patient taking differently: Take 200 mg by mouth every 8 (eight) hours as needed for fever, headache or mild pain (pain score 1-3). Take with food) 60 tablet 0 Taking Differently   olmesartan (BENICAR) 40 MG tablet Take 40 mg by mouth daily.   Past Week   omeprazole (PRILOSEC OTC) 20 MG tablet Take 20 mg by mouth daily.   03/07/2023   oxyCODONE (ROXICODONE) 5 MG immediate release tablet Take 1 tablet (5 mg total) by mouth every 6 (six) hours as needed. (Patient not taking: Reported on 03/08/2023) 30 tablet 0 Not Taking   DULoxetine (CYMBALTA)  60 MG capsule Take 60 mg by mouth daily. (Patient not taking: Reported on 03/08/2023)   Not Taking   Scheduled:   amLODipine  10 mg Oral Daily   atorvastatin  40 mg Oral Daily   bisacodyl  10 mg Oral Daily   Or   bisacodyl  10 mg Rectal Daily   Chlorhexidine Gluconate Cloth  6 each Topical Daily   docusate sodium  200 mg Oral BID   feeding supplement  237 mL Oral BID BM   folic acid  1 mg Oral Daily   furosemide  40 mg Oral BID   irbesartan  300 mg Oral Daily   magnesium oxide  400 mg Oral BID   pantoprazole  40 mg Oral Daily   polyethylene glycol  17 g Oral Daily   senna  2 tablet Oral QHS   sodium chloride flush  10-40 mL Intracatheter Q12H   sodium chloride flush  3 mL Intravenous Q12H   thiamine  100 mg Oral Daily   Or   thiamine  100 mg Intravenous Daily   warfarin  5 mg Oral q1600   Warfarin - Pharmacist Dosing Inpatient   Does not apply q1600    Assessment: 63 yo male with severe MR and AR 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 Pharmacy consulted to dose warfarin. No anticoagulants noted PTA.  INR is therapeutic  at 2.7. Hgb low stable 7-8s and platelets are stable. No signs of bleeding.   Goal of Therapy:  INR goal 2.5-3.5 (discuss with Dr. Dorris Fetch) Monitor platelets by anticoagulation protocol: Yes   Plan:  -Warfarin 5 mg PO daily was ordered by TCTS PA -Monitor daily PT/INR, CBC, and signs of bleeding  Thank you for involving pharmacy in this patient's care.  Loura Back, PharmD, BCPS Clinical Pharmacist Clinical phone for 03/25/2023 is [phone] 03/25/2023 10:57 AM

## 2023-03-25 NOTE — Plan of Care (Signed)
  Problem: Education: Goal: Knowledge of General Education information will improve Description: Including pain rating scale, medication(s)/side effects and non-pharmacologic comfort measures Outcome: Progressing   Problem: Health Behavior/Discharge Planning: Goal: Ability to manage health-related needs will improve Outcome: Progressing   Problem: Clinical Measurements: Goal: Ability to maintain clinical measurements within normal limits will improve Outcome: Progressing Goal: Will remain free from infection Outcome: Progressing Goal: Cardiovascular complication will be avoided Outcome: Progressing   Problem: Activity: Goal: Risk for activity intolerance will decrease Outcome: Progressing   Problem: Nutrition: Goal: Adequate nutrition will be maintained Outcome: Progressing   Problem: Coping: Goal: Level of anxiety will decrease Outcome: Progressing   Problem: Elimination: Goal: Will not experience complications related to urinary retention Outcome: Progressing   Problem: Pain Management: Goal: General experience of comfort will improve Outcome: Progressing   Problem: Safety: Goal: Ability to remain free from injury will improve Outcome: Progressing   Problem: Skin Integrity: Goal: Risk for impaired skin integrity will decrease Outcome: Progressing   Problem: Activity: Goal: Risk for activity intolerance will decrease Outcome: Progressing

## 2023-03-25 NOTE — Progress Notes (Addendum)
      [address]       [place] [address]             [phone]      9 Days Post-Op Procedure(s) (LRB): AORTIC VALVE REPLACEMENT (AVR) USING ON-X PROSTHETIC AORTIC HEART VALVE WITH ANATOMIC SEWING RING SIZE (N/A) MITRAL VALVE (MV) REPLACEMENT USING ON-X PROSTHETIC MITRAL HEART VALVE WITH STANDARD SEWING RING SIZE 31/33MM (N/A) TRANSESOPHAGEAL ECHOCARDIOGRAM (TEE) (N/A)  Subjective:  Patient is very upset about home health arrangements.  He wanted to go home today but is agreeable to stay until home health can come to his house.  Objective: Vital signs in last 24 hours: Temp:  [97.6 F (36.4 C)-98.1 F (36.7 C)] 97.8 F (36.6 C) (01/26 0824) Pulse Rate:  [86-96] 86 (01/26 0824) Cardiac Rhythm: Normal sinus rhythm (01/25 1906) Resp:  [16-20] 16 (01/26 0824) BP: (115-160)/(50-80) 143/80 (01/26 0824) SpO2:  [94 %-100 %] 94 % (01/26 0824)  Intake/Output from previous day: 01/25 0701 - 01/26 0700 In: 2140.8 [P.O.:1140; IV Piggyback:1000.8] Out: 2110 [Urine:2110]  General appearance: alert, cooperative, and no distress Heart: regular rate and rhythm Lungs: clear to auscultation bilaterally Abdomen: soft, non-tender; bowel sounds normal; no masses,  no organomegaly Extremities: extremities normal, atraumatic, no cyanosis or edema Wound: clean and dry  Lab Results: Recent Labs    03/24/23 0355 03/25/23 0511  WBC 5.4 5.8  HGB 7.5* 7.8*  HCT 23.3* 24.3*  PLT 226 245   BMET:  Recent Labs    03/23/23 0355 03/24/23 0355  NA 133* 130*  K 4.2 4.1  CL 103 103  CO2 22 22  GLUCOSE 121* 95  BUN 11 10  CREATININE 0.73 0.61  CALCIUM 8.4* 8.0*    PT/INR:  Recent Labs    03/25/23 0511  LABPROT 28.9*  INR 2.7*   ABG    Component Value Date/Time   PHART 7.344 (L) 03/17/2023 0107   HCO3 22.3 03/17/2023 0107   TCO2 24 03/17/2023 0107   ACIDBASEDEF 3.0 (H) 03/17/2023 0107   O2SAT 97 03/17/2023 0107   CBG (last 3)  No results for input(s):  "GLUCAP" in the last 72 hours.  Assessment/Plan: S/P Procedure(s) (LRB): AORTIC VALVE REPLACEMENT (AVR) USING ON-X PROSTHETIC AORTIC HEART VALVE WITH ANATOMIC SEWING RING SIZE (N/A) MITRAL VALVE (MV) REPLACEMENT USING ON-X PROSTHETIC MITRAL HEART VALVE WITH STANDARD SEWING RING SIZE 31/33MM (N/A) TRANSESOPHAGEAL ECHOCARDIOGRAM (TEE) (N/A)  CV- NSR, H/O HTN- continue Norvasc, Avapro INR 2.7, continue coumadin at 5 mg daily Renal- weight is stable, will decrease Lasix to 40 mg daily ID- endocarditis- continue PCN G, unfortunately home health can't be arranged until Thursday.. will plan to d/c on Wednesday afternoon/evening Dispo- patient stable, continue current care, tentative plan for d/c Wednesday afternoon/evening with home health care nursing infusion care to start Thursday   LOS: 17 days   Lowella Dandy, PA-C 03/25/2023   Agree with above Dispo planning  Polo Mcmartin O Rigdon Macomber

## 2023-03-26 ENCOUNTER — Other Ambulatory Visit: Payer: Self-pay | Admitting: Surgical

## 2023-03-26 ENCOUNTER — Ambulatory Visit: Payer: 59

## 2023-03-26 LAB — CBC
HCT: 23.8 % — ABNORMAL LOW (ref 39.0–52.0)
Hemoglobin: 7.7 g/dL — ABNORMAL LOW (ref 13.0–17.0)
MCH: 27.6 pg (ref 26.0–34.0)
MCHC: 32.4 g/dL (ref 30.0–36.0)
MCV: 85.3 fL (ref 80.0–100.0)
Platelets: 224 10*3/uL (ref 150–400)
RBC: 2.79 MIL/uL — ABNORMAL LOW (ref 4.22–5.81)
RDW: 16.8 % — ABNORMAL HIGH (ref 11.5–15.5)
WBC: 5.1 10*3/uL (ref 4.0–10.5)
nRBC: 0 % (ref 0.0–0.2)

## 2023-03-26 LAB — PROTIME-INR
INR: 2.9 — ABNORMAL HIGH (ref 0.8–1.2)
Prothrombin Time: 30.3 s — ABNORMAL HIGH (ref 11.4–15.2)

## 2023-03-26 MED ORDER — WARFARIN SODIUM 2 MG PO TABS
4.0000 mg | ORAL_TABLET | Freq: Every day | ORAL | Status: DC
  Administered 2023-03-26: 4 mg via ORAL
  Filled 2023-03-26: qty 2

## 2023-03-26 MED ORDER — ACETAMINOPHEN 325 MG PO TABS: 650.0000 mg | ORAL_TABLET | ORAL | Status: AC | PRN

## 2023-03-26 MED ORDER — TRAMADOL HCL 50 MG PO TABS: 50.0000 mg | ORAL_TABLET | Freq: Four times a day (QID) | ORAL | 0 refills | Status: AC | PRN

## 2023-03-26 MED ORDER — WARFARIN SODIUM 4 MG PO TABS: 4.0000 mg | ORAL_TABLET | Freq: Every day | ORAL | 1 refills | Status: DC

## 2023-03-26 MED ORDER — FUROSEMIDE 40 MG PO TABS: 40.0000 mg | ORAL_TABLET | Freq: Every day | ORAL | 0 refills | Status: DC

## 2023-03-26 MED ORDER — MAGNESIUM OXIDE -MG SUPPLEMENT 400 (240 MG) MG PO TABS: 400.0000 mg | ORAL_TABLET | Freq: Two times a day (BID) | ORAL | 0 refills | Status: DC

## 2023-03-26 MED ORDER — WARFARIN SODIUM 2.5 MG PO TABS: 2.5000 mg | ORAL_TABLET | Freq: Every day | ORAL | Status: DC

## 2023-03-26 MED ORDER — WARFARIN SODIUM 5 MG PO TABS: 5.0000 mg | ORAL_TABLET | Freq: Every day | ORAL | Status: DC

## 2023-03-26 MED ORDER — AMLODIPINE BESYLATE 10 MG PO TABS: 10.0000 mg | ORAL_TABLET | Freq: Every day | ORAL | 3 refills | Status: DC

## 2023-03-26 MED ORDER — HEPARIN SOD (PORK) LOCK FLUSH 100 UNIT/ML IV SOLN
250.0000 [IU] | INTRAVENOUS | Status: AC | PRN
  Administered 2023-03-26: 250 [IU]

## 2023-03-26 MED ORDER — ATORVASTATIN CALCIUM 40 MG PO TABS: 40.0000 mg | ORAL_TABLET | Freq: Every day | ORAL | 3 refills | Status: DC

## 2023-03-26 MED ORDER — POTASSIUM CHLORIDE CRYS ER 20 MEQ PO TBCR: 20.0000 meq | EXTENDED_RELEASE_TABLET | Freq: Every day | ORAL | 0 refills | Status: DC

## 2023-03-26 NOTE — Progress Notes (Signed)
PHARMACY - ANTICOAGULATION CONSULT NOTE  Pharmacy Consult for warfarin Indication: mechanical AVR and MVR  Allergies  Allergen Reactions   Hydrocodone Rash    Patient Measurements: Height: 5\' 11"  (180.3 cm) Weight: 81.3 kg (179 lb 4.8 oz) IBW/kg (Calculated) : 75.3 Heparin dosing weight: 78.6 kg   Vital Signs: Temp: 97.4 F (36.3 C) (01/27 0806) Temp Source: Oral (01/27 0806) BP: 169/99 (01/27 0806) Pulse Rate: 92 (01/27 0806)  Labs: Recent Labs    03/24/23 0355 03/25/23 0511 03/26/23 0526  HGB 7.5* 7.8* 7.7*  HCT 23.3* 24.3* 23.8*  PLT 226 245 224  LABPROT 27.5* 28.9* 30.3*  INR 2.5* 2.7* 2.9*  CREATININE 0.61  --   --     Estimated Creatinine Clearance: 102 mL/min (by C-G formula based on SCr of 0.61 mg/dL).   Medical History: Past Medical History:  Diagnosis Date   Blood dyscrasia    GERD (gastroesophageal reflux disease)    History of kidney stones    Hypertension    Thrombocytopenia (HCC) 04/12/2016    Medications:  Medications Prior to Admission  Medication Sig Dispense Refill Last Dose/Taking   acetaminophen (TYLENOL) 650 MG CR tablet Take 1,300 mg by mouth every 8 (eight) hours as needed for pain.   Past Week   ALPRAZolam (XANAX) 1 MG tablet Take 1 mg by mouth at bedtime as needed for anxiety.   Past Week   ibuprofen (ADVIL) 800 MG tablet Take 1 tablet (800 mg total) by mouth 3 (three) times daily. Take with food (Patient taking differently: Take 200 mg by mouth every 8 (eight) hours as needed for fever, headache or mild pain (pain score 1-3). Take with food) 60 tablet 0 Taking Differently   olmesartan (BENICAR) 40 MG tablet Take 40 mg by mouth daily.   Past Week   omeprazole (PRILOSEC OTC) 20 MG tablet Take 20 mg by mouth daily.   03/07/2023   oxyCODONE (ROXICODONE) 5 MG immediate release tablet Take 1 tablet (5 mg total) by mouth every 6 (six) hours as needed. (Patient not taking: Reported on 03/08/2023) 30 tablet 0 Not Taking   DULoxetine (CYMBALTA)  60 MG capsule Take 60 mg by mouth daily. (Patient not taking: Reported on 03/08/2023)   Not Taking   Scheduled:   amLODipine  10 mg Oral Daily   atorvastatin  40 mg Oral Daily   bisacodyl  10 mg Oral Daily   Or   bisacodyl  10 mg Rectal Daily   Chlorhexidine Gluconate Cloth  6 each Topical Daily   docusate sodium  200 mg Oral BID   feeding supplement  237 mL Oral BID BM   folic acid  1 mg Oral Daily   furosemide  40 mg Oral BID   irbesartan  300 mg Oral Daily   magnesium oxide  400 mg Oral BID   pantoprazole  40 mg Oral Daily   polyethylene glycol  17 g Oral Daily   senna  2 tablet Oral QHS   sodium chloride flush  10-40 mL Intracatheter Q12H   sodium chloride flush  3 mL Intravenous Q12H   thiamine  100 mg Oral Daily   Or   thiamine  100 mg Intravenous Daily   warfarin  4 mg Oral q1600   Warfarin - Pharmacist Dosing Inpatient   Does not apply q1600    Assessment: 63 yo male with severe MR and AR 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 Pharmacy consulted to dose warfarin. No anticoagulants noted PTA.  INR  is therapeutic at 2.9 (+0.2). Hgb low stable 7-8s and platelets are stable. No signs of bleeding.   Goal of Therapy:  INR goal 2.5-3.5 (discuss with Dr. Dorris Fetch) Monitor platelets by anticoagulation protocol: Yes   Plan:  -Warfarin 4 mg PO daily was ordered by [facility] PA -Monitor daily PT/INR, CBC, and signs of bleeding  Thank you for involving pharmacy in this patient's care.  Wilmer Floor, PharmD PGY2 Cardiology Pharmacy Resident Clinical phone for 03/26/2023 is [phone] 03/26/2023 8:30 AM

## 2023-03-26 NOTE — Progress Notes (Signed)
CARDIAC REHAB PHASE I    Pt for discharge today. Post [facility] education completed. Referral for CRP2 sent to [facility].  Woodroe Chen, RN BSN 03/26/2023 11:55 AM

## 2023-03-26 NOTE — Progress Notes (Addendum)
Administered tramadol 100 mg and benadryl per pt requested for pain score of 7.   Lawson Radar, RN

## 2023-03-26 NOTE — Progress Notes (Signed)
D/c piv and tele. Went over AVS with pt and all questions were addressed.   Lawson Radar, RN

## 2023-03-26 NOTE — Progress Notes (Signed)
Mobility Specialist Progress Note:    03/26/23 1154  Mobility  Activity Stood at bedside;Dangled on edge of bed;Ambulated with assistance in room;Ambulated with assistance in hallway  Level of Assistance Standby assist, set-up cues, supervision of patient - no hands on  Assistive Device None  Distance Ambulated (ft) 500 ft  Activity Response Tolerated well  Mobility Referral Yes  Mobility visit 1 Mobility  Mobility Specialist Start Time (ACUTE ONLY) 1154  Mobility Specialist Stop Time (ACUTE ONLY) 1205  Mobility Specialist Time Calculation (min) (ACUTE ONLY) 11 min   Pt received in bed, agreeable to mobility session. Ambulated in hallway with ModI using IV pole for safety. Tolerated well, asx throughout. Returned pt back to room, sitting up in chair with all needs met, call bell in reach.   Feliciana Rossetti Mobility Specialist Please contact via Special educational needs teacher or  Rehab office at [phone]

## 2023-03-26 NOTE — TOC Transition Note (Addendum)
Transition of Care (TOC) - Discharge Note Donn Pierini RN, BSN Transitions of Care Unit 4E- RN Case Manager See Treatment Team for direct phone #   Patient Details  Name: Thomas [name] MRN: [id number] Date of Birth: [birth year]  Transition of Care [facility]) CM/SW Contact:  Darrold Span, RN Phone Number: 03/26/2023, 11:46 AM   Clinical Narrative:    Noted weekend notes that pt held due to [facility] RN delay. Calls made to [facility] and [facility] for clarification. Per [facility] liaison Florentina Addison - there is no need to hold pt here- education has been completed with pt and SO- meds are ready to deliver to the home for pt to hook himself up on discharge. Per [facility] liaison- their RN will plan to do weekly visit for lab draws and PICC line care on Thur. 1/30- they also are ok with pt discharge. Pt will have contact number for [facility] nurse line should he have any questions prior to visit. Pt also has contact info for [facility] nurse should he have any questions or problems with home infusion.  CM spoke with pt who voiced he wants to go home.  CM called [facility] PA- W. GOLD to inform that pt was set to go home with [facility] and home infusion needs in place.  Pt will d/c home today. [facility] to deliver meds to the home this afternoon.   1230- called and spoke with SO- Richarda Blade to answer questions about HH and infusion arrangements- after conversation- Burnett Harry voiced understanding and states she will be here later this afternoon around 430-5pm for transport home.   1555- [facility] RN- Nadine to come by room to review education with pt prior to d/c   Final next level of care: Home w Home Health Services Barriers to Discharge: Barriers Resolved   Patient Goals and CMS Choice Patient states their goals for this hospitalization and ongoing recovery are:: wants to recover CMS Medicare.gov Compare Post Acute Care list provided to:: Patient Choice offered to / list presented to : Patient       Discharge Placement               Home w/ [facility]        Discharge Plan and Services Additional resources added to the After Visit Summary for   In-house Referral: NA Discharge Planning Services: CM Consult Post Acute Care Choice: Home Health          DME Arranged: Walker rolling with seat DME Agency: [facility] Date DME Agency Contacted: 03/22/23   Representative spoke with at DME Agency: Zollie Beckers [facility] Arranged: RN [facility] Agency: [facility] Date [facility] Agency Contacted: 03/26/23 Time HH Agency Contacted: 1000 Representative spoke with at [facility] Agency: Amy  Social Drivers of Health (SDOH) Interventions SDOH Screenings   Food Insecurity: No Food Insecurity (03/10/2023)  Housing: Low Risk  (03/10/2023)  Transportation Needs: No Transportation Needs (03/10/2023)  Utilities: Not At Risk (03/10/2023)  Social Connections: Socially Integrated (03/10/2023)  Tobacco Use: Low Risk  (03/16/2023)     Readmission Risk Interventions    03/26/2023   11:46 AM  Readmission Risk Prevention Plan  Transportation Screening Complete  Home Care Screening Complete  Medication Review (RN CM) Complete

## 2023-03-26 NOTE — Progress Notes (Addendum)
      [address]       [place] [address]             [phone]        10 Days Post-Op Procedure(s) (LRB): AORTIC VALVE REPLACEMENT (AVR) USING ON-X PROSTHETIC AORTIC HEART VALVE WITH ANATOMIC SEWING RING SIZE (N/A) MITRAL VALVE (MV) REPLACEMENT USING ON-X PROSTHETIC MITRAL HEART VALVE WITH STANDARD SEWING RING SIZE 31/33MM (N/A) TRANSESOPHAGEAL ECHOCARDIOGRAM (TEE) (N/A)  Subjective: He walked 3 times yesterday. He has no complaints;except waiting to go home.  Objective: Vital signs in last 24 hours: Temp:  [97.2 F (36.2 C)-98.3 F (36.8 C)] 98.3 F (36.8 C) (01/27 0406) Pulse Rate:  [86-95] 92 (01/27 0406) Cardiac Rhythm: Normal sinus rhythm;Heart block (01/26 2000) Resp:  [16-24] 20 (01/27 0406) BP: (121-147)/(60-131) 142/67 (01/27 0406) SpO2:  [93 %-98 %] 98 % (01/27 0406) Weight:  [81.3 kg] 81.3 kg (01/27 0406)  Pre op weight 81.3 kg Current Weight  03/26/23 81.3 kg      Intake/Output from previous day: 01/26 0701 - 01/27 0700 In: 1350.1 [P.O.:350; IV Piggyback:1000.1] Out: 800 [Urine:800]   Physical Exam:  Cardiovascular: RRR Pulmonary: Slightly diminished bibasilar breath sounds Abdomen: Soft, non tender, bowel sounds present. Extremities: Trace bilateral lower extremity edema. Wounds: Clean and dry.  No erythema or signs of infection.  Lab Results: CBC: Recent Labs    03/25/23 0511 03/26/23 0526  WBC 5.8 5.1  HGB 7.8* 7.7*  HCT 24.3* 23.8*  PLT 245 224   BMET:  Recent Labs    03/24/23 0355  NA 130*  K 4.1  CL 103  CO2 22  GLUCOSE 95  BUN 10  CREATININE 0.61  CALCIUM 8.0*    PT/INR:  Lab Results  Component Value Date   INR 2.9 (H) 03/26/2023   INR 2.7 (H) 03/25/2023   INR 2.5 (H) 03/24/2023   ABG:  INR: Will add last result for INR, ABG once components are confirmed Will add last 4 CBG results once components are confirmed  Assessment/Plan:  1. CV - SR, first degree heart block. On Amlodipine 10  mg daily, Coumadin, and Irbesartan 300 mg daily. INR this am slightly increased to 2.9. He has been given 3 doses of 5 mg of Coumadin.  2.  Pulmonary - On room air. Encourage incentive spirometer. 3. Above pre op weight, requires further diuresis-on  Lasix 40 mg bid 4.  Expected post op acute blood loss anemia - H and H this am stable at 7.7 and 23.8 5. ID-on PCN G for endocarditis. 6. Disposition-HH to be arranged and discharge set for Wednesday afternoon/evening  Lelon Huh [facility] 7:33 AM Patient seen and examined, agree with above Doing well Home once arrangements are in place  Bristow C. Dorris Fetch, MD [facility] [phone]'

## 2023-03-28 ENCOUNTER — Other Ambulatory Visit (INDEPENDENT_AMBULATORY_CARE_PROVIDER_SITE_OTHER): Payer: Self-pay

## 2023-03-28 ENCOUNTER — Ambulatory Visit (INDEPENDENT_AMBULATORY_CARE_PROVIDER_SITE_OTHER): Payer: 59 | Admitting: Orthopedic Surgery

## 2023-03-28 DIAGNOSIS — M25831 Other specified joint disorders, right wrist: Secondary | ICD-10-CM | POA: Diagnosis not present

## 2023-03-28 NOTE — Progress Notes (Signed)
   Thomas [name] [facility] - 63 y.o. male MRN [id number]  Date of birth: April 02, 1960  Office Visit Note: Visit Date: 03/28/2023 PCP: Assunta Found, MD Referred by: Assunta Found, MD  Subjective:  HPI: Thomas [name] is a 63 y.o. 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  Pertinent ROS were reviewed with the patient and found to be negative unless otherwise specified above in HPI.   Assessment & Plan: Visit Diagnoses:  1. Ulnar impaction syndrome, right     Plan: Pin was removed today from the distal radial ulnar joint.  Short arm cast was reapplied.  X-rays obtained today show stable appearance of the ulnar shaft open reduction internal fixation, hardware remains well-fixed.  Follow-up in approximate 3 weeks time for repeat clinical and radiographic check.  Follow-up: No follow-ups on file.   Meds & Orders: No orders of the defined types were placed in this encounter.   Orders Placed This Encounter  Procedures   XR Forearm Right     Procedures: No procedures performed       Objective:   Vital Signs: There were no vitals taken for this visit.  Ortho Exam Right upper extremity: - Pin removed at the wrist, site remains clean dry and intact - Digital range of motion is preserved, hand remains warm well-perfused, sensation intact in all distributions - Well-healed ulnar incision over the forearm  Imaging: XR Forearm Right Result Date: 03/28/2023 X-rays of the right forearm, multiple views were obtained today X-rays demonstrate stable appearance of the ulnar shaft status post hardware fixation.  Hardware remains well-fixed in all planes without evidence of failure or migration.    Thomas [name], M.D. [facility] 10:58 AM

## 2023-03-29 LAB — ACID FAST CULTURE WITH REFLEXED SENSITIVITIES (MYCOBACTERIA)
Acid Fast Culture: NEGATIVE
Acid Fast Culture: NEGATIVE
Acid Fast Culture: NEGATIVE
Acid Fast Culture: NEGATIVE
Acid Fast Culture: NEGATIVE

## 2023-04-01 ENCOUNTER — Other Ambulatory Visit: Payer: Self-pay | Admitting: Surgical

## 2023-04-02 ENCOUNTER — Ambulatory Visit: Payer: 59 | Attending: Internal Medicine

## 2023-04-03 ENCOUNTER — Encounter: Payer: Self-pay | Admitting: Internal Medicine

## 2023-04-03 ENCOUNTER — Encounter: Payer: Self-pay | Admitting: *Deleted

## 2023-04-03 LAB — LAB REPORT - SCANNED: EGFR: 109

## 2023-04-05 ENCOUNTER — Telehealth: Payer: Self-pay | Admitting: Pharmacist

## 2023-04-05 ENCOUNTER — Ambulatory Visit (INDEPENDENT_AMBULATORY_CARE_PROVIDER_SITE_OTHER): Payer: 59

## 2023-04-05 ENCOUNTER — Other Ambulatory Visit: Payer: Self-pay | Admitting: Thoracic Surgery (Cardiothoracic Vascular Surgery)

## 2023-04-05 ENCOUNTER — Telehealth: Payer: Self-pay

## 2023-04-05 ENCOUNTER — Ambulatory Visit (INDEPENDENT_AMBULATORY_CARE_PROVIDER_SITE_OTHER): Payer: 59 | Admitting: Physician Assistant

## 2023-04-05 ENCOUNTER — Other Ambulatory Visit: Payer: Self-pay

## 2023-04-05 ENCOUNTER — Ambulatory Visit
Admission: RE | Admit: 2023-04-05 | Discharge: 2023-04-05 | Disposition: A | Discharge status: Home / Self Care | Payer: 59 | Source: Ambulatory Visit | Attending: Physician Assistant | Admitting: Physician Assistant

## 2023-04-05 VITALS — BP 139/77 | HR 99 | Resp 20 | Ht 71.0 in | Wt 179.0 lb

## 2023-04-05 DIAGNOSIS — Z7901 Long term (current) use of anticoagulants: Secondary | ICD-10-CM | POA: Insufficient documentation

## 2023-04-05 DIAGNOSIS — R0602 Shortness of breath: Secondary | ICD-10-CM

## 2023-04-05 DIAGNOSIS — Z952 Presence of prosthetic heart valve: Secondary | ICD-10-CM

## 2023-04-05 DIAGNOSIS — Z09 Encounter for follow-up examination after completed treatment for conditions other than malignant neoplasm: Secondary | ICD-10-CM

## 2023-04-05 LAB — PROTIME-INR
INR: 2.3 — ABNORMAL HIGH
Prothrombin Time: 22.9 s — ABNORMAL HIGH (ref 9.0–11.5)

## 2023-04-05 MED ORDER — POTASSIUM CHLORIDE CRYS ER 20 MEQ PO TBCR: 20.0000 meq | EXTENDED_RELEASE_TABLET | Freq: Two times a day (BID) | ORAL | 0 refills | Status: DC

## 2023-04-05 MED ORDER — FUROSEMIDE 40 MG PO TABS: 40.0000 mg | ORAL_TABLET | Freq: Two times a day (BID) | ORAL | 0 refills | Status: DC

## 2023-04-05 NOTE — Telephone Encounter (Signed)
 Patient contacted the office today with concerns about shortness of breath s/p AVR 1/17. He was discharged home 1/27 and was placed on a 7 day supply of Lasix  which ended 3 days ago. Patient is unable to complete a sentence or walk with out getting short of breath. Spoke with his sig. Other, [name]. His follow-up appointment was rescheduled to today with a chest xray to see a PA. Advised that if shortness of breath were to worsen before xray/ appointment she should call 911 or go to the nearest emergency room. She acknowledged receipt.

## 2023-04-05 NOTE — Telephone Encounter (Signed)
 INR resulted. Called and provided instructions to Odilia Bennett, Charity fundraiser at [employer] office. Please refer to anticoagulation encounter.

## 2023-04-05 NOTE — Progress Notes (Signed)
 HPI: Thomas [name] is a 63 year old male with a past medical history of hypertension, GERD, anxiety, alcohol dependence, thrombocytopenia, and left nephrectomy secondary to a kidney stone complication as a child. He underwent right wrist ulnar shortening osteotomy by ortho on 8/27. He then underwent right wrist ulnar shortening osteotomy plate removal and bone biopsies with cultures of the distal ulna on 12/13 due to hardware failure and osteolysis. Cultures came back with no growth at that time. 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 He presented to the ED on 1/09 with one week of worsening shortness of breath, fatigue and increasing lower extremity edema.  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  After completing medical workup and treatment with IV antibiotics, he was taken the OR on 03/16/2023 where the aortic valve and mitral valve were replaced with Onyx mechanical prostheses by Dr. Kerrin.  He also had bilateral pleural effusions at the time that were drained while in the operating room.  The postoperative course was notable for junctional rhythm that persisted.  Permanent pacemaker was being considered by the electrophysiology team but the patient's cardiac conduction improved and the the permanent pacemaker was not required.  Thomas [name] also developed recurrent left pleural effusion.  He underwent left thoracentesis on 03/23/2023 yielding 1200 mL of thin serous fluid.  Follow-up chest x-ray showed smaller bilateral pleural effusions.  Thomas [name] was discharged to home on 03/26/2023 with an INR of 2.9 and Coumadin  was dosed at 4 mg daily.  He called our office earlier today reporting worsening shortness of breath.  When asked to come to the office for evaluation and for chest x-ray.  He was also noted  to have not had an INR since his discharge.  We arranged for PT/INR prior to the visit today and it resulted at 2.3. Today, Thomas [name]'s primary complaint is shortness of breath and cough that have worsened over the last few days.  He finished his 7 days of oral Lasix  as prescribed at discharge but has not had any diuretic for the past 4 days.   Current Outpatient Medications  Medication Sig Dispense Refill   acetaminophen  (TYLENOL ) 325 MG tablet Take 2 tablets (650 mg total) by mouth every 4 (four) hours as needed for mild pain (pain score 1-3) or fever.     ALPRAZolam  (XANAX ) 1 MG tablet Take 1 mg by mouth at bedtime as needed for anxiety.     amLODipine  (NORVASC ) 10 MG tablet Take 1 tablet (10 mg total) by mouth daily. 30 tablet 3   atorvastatin  (LIPITOR) 40 MG tablet Take 1 tablet (40 mg total) by mouth daily. 30 tablet 3   furosemide  (LASIX ) 40 MG tablet Take 1 tablet (40 mg total) by mouth daily. 7 tablet 0   magnesium  oxide (MAG-OX) 400 (240 Mg) MG tablet Take 1 tablet (400 mg total) by mouth 2 (two) times daily. 20 tablet 0   olmesartan (BENICAR) 40 MG tablet Take 40 mg by mouth daily.     omeprazole (PRILOSEC OTC) 20 MG tablet Take 20 mg by mouth daily.     penicillin  G IVPB Inject 24 Million Units into the vein daily for 24 days. As a continuous infusion Indication:  Endocarditis First Dose: Yes Last Day of Therapy:  04/13/23 Labs - Once weekly:  CBC/D and BMP, Labs - Once weekly: ESR and  CRP Method of administration: Elastomeric (Continuous infusion) Method of administration may be changed at the discretion of home infusion pharmacist based upon assessment of the patient and/or caregiver's ability to self-administer the medication ordered. 24 Units 0   potassium chloride  SA (KLOR-CON  M) 20 MEQ tablet Take 1 tablet (20 mEq total) by mouth daily. 7 tablet 0   warfarin (COUMADIN ) 4 MG tablet Take 1 tablet (4 mg total) by mouth daily at 4 PM. As directed by coumadin  clinic 100 tablet  1   No current facility-administered medications for this visit.    Physical Exam: Vital signs BP 139/77 Heart rate 99 Respirations 20 SpO2 100% on room air  General: 63 year old male appears uncomfortable.  Skin is warm and dry.  He has normal work of breathing and is not tachypneic. Neck: No JVD Heart: Regular rate and rhythm, no murmur Chest: Breath sounds diminished in the bases but otherwise clear bilaterally.  The sternotomy incision is healing no evidence of complication.  Sutures were removed from the chest tube sites Extremities: He has 2+ edema in the ankles and lower legs   Diagnostic Tests: CLINICAL DATA:  Shortness of breath. Cough and bilateral chest pain.   EXAM: CHEST - 2 VIEW   COMPARISON:  Chest radiograph dated 03/24/2023.   FINDINGS: Left-sided PICC with tip at the cavoatrial junction. Bilateral pleural effusions similar or slightly increased since the prior radiograph. There are bibasilar atelectasis. Pneumonia is not excluded no pneumothorax. The cardiac borders are silhouetted. Median sternotomy wires and mechanical cardiac valve. No acute osseous pathology.   IMPRESSION: Bilateral pleural effusions and bibasilar atelectasis.     Electronically Signed   By: Vanetta Chou M.D.   On: 04/05/2023 14:46  Impression / Plan: Thomas [name] is now about 3 weeks post mitral valve and aortic valve replacements with mechanical valves for endocarditis.  He continues on IV antibiotics by way of PICC line as prescribed by with infectious disease service.  He has had progressive shortness of breath after completing his Lasix  prescription about 4 days ago.  On exam, he has diminished breath sounds in the bases correlating with the chest x-ray obtained today showing moderate bilateral pleural effusions.  He also has edema in his lower extremities.  His INR today is 2.3. Will arrange for left thoracentesis by interventional radiology at the earliest date available  which is tomorrow at 11 AM.  Will consider right thoracentesis after we obtain follow-up chest x-ray.  We have also arranged for him to have an echocardiogram at the [facility] cardiology office on Monday. Prescriptions are sent into  [facility] on [address]. for Lasix  40 mg twice daily along with potassium chloride  20 mill equivalents twice daily to start this evening.   Bolivar Koranda G. Sukaina Toothaker, PA-C [facility] [phone]

## 2023-04-05 NOTE — Patient Instructions (Addendum)
 Start Lasix  40 mg by mouth twice daily this evening.  Also resume K-Dur 20 mEq twice daily while taking Lasix .  Will schedule for left thoracentesis by interventional radiology at the hospital tomorrow at 11 AM.  Follow-up echocardiogram is scheduled for Monday, 04/09/2023 at 8 AM.

## 2023-04-05 NOTE — Telephone Encounter (Signed)
 Patient is currently receiving penicillin  for endocarditis until 04/13/23. CBC from 04/03/23 showed hemoglobin of 7.1 and RBC of 2.70. He is following closely with cardiology but will route to Dr. Efrain as a FYI.  Hargis Vandyne L. Spruha Weight, PharmD, BCIDP, AAHIVP, CPP Clinical Pharmacist Practitioner Infectious Diseases Clinical Pharmacist [facility] 04/05/2023, 4:39 PM

## 2023-04-05 NOTE — Telephone Encounter (Signed)
 Received call from [place] at [place] office. States pt missed his coumadin  clinic appt in [place] on 04/02/23 and was unaware he missed this appt. Pt is seeing MD today at [facility] and requesting INR be checked today. Rosina is ordering a STAT PT/INR at lab. Will contact pt when labs have results and schedule pt's next coumadin  clinic appt at [facility] office when he sees [place], [place] on 04/13/23.

## 2023-04-05 NOTE — Patient Instructions (Signed)
 Description   Called and spoke with Rosina at [place] - pt is currently at office.  Instructed for pt to take 1.5 tablets today and then START taking 1 tablet daily EXCEPT 1.5 tablets on Mondays.  Recheck INR on Friday, 04/13/23 at [facility] office.  Confirmed with Rosina, RN -  INR goal should be 2.5-3.5

## 2023-04-06 ENCOUNTER — Telehealth: Payer: Self-pay

## 2023-04-06 ENCOUNTER — Ambulatory Visit (HOSPITAL_COMMUNITY)
Admission: RE | Admit: 2023-04-06 | Discharge: 2023-04-06 | Disposition: A | Discharge status: Home / Self Care | Payer: 59 | Source: Ambulatory Visit | Attending: Thoracic Surgery (Cardiothoracic Vascular Surgery) | Admitting: Thoracic Surgery (Cardiothoracic Vascular Surgery)

## 2023-04-06 ENCOUNTER — Ambulatory Visit (HOSPITAL_COMMUNITY)
Admission: RE | Admit: 2023-04-06 | Discharge: 2023-04-06 | Disposition: A | Discharge status: Home / Self Care | Payer: 59 | Source: Ambulatory Visit | Attending: Physician Assistant | Admitting: Physician Assistant

## 2023-04-06 ENCOUNTER — Other Ambulatory Visit (HOSPITAL_COMMUNITY): Payer: Self-pay | Admitting: Physician Assistant

## 2023-04-06 DIAGNOSIS — J90 Pleural effusion, not elsewhere classified: Secondary | ICD-10-CM | POA: Diagnosis present

## 2023-04-06 DIAGNOSIS — Z952 Presence of prosthetic heart valve: Secondary | ICD-10-CM

## 2023-04-06 HISTORY — PX: IR THORACENTESIS ASP PLEURAL SPACE W/IMG GUIDE: IMG5380

## 2023-04-06 MED ORDER — LIDOCAINE HCL 1 % IJ SOLN
INTRAMUSCULAR | Status: AC
  Filled 2023-04-06: qty 20

## 2023-04-06 MED ORDER — LIDOCAINE HCL 1 % IJ SOLN
20.0000 mL | Freq: Once | INTRAMUSCULAR | Status: AC
  Administered 2023-04-06: 15 mL

## 2023-04-06 NOTE — Telephone Encounter (Signed)
 Received call from Innocence, RN with [facility]. She states she saw patient earlier this week and noticed he had a dirty dressing over an abdominal wound that was soaked in old blood. She changed the dressing, reports stitches were intact. Also states patient had concerns of feeling cold, but that his temp was normal at 98. Patient also reported some shortness of breath and declined a nurse visit today. HH RN recommended he go to urgent care.  Innocence requested orders for wound care, discussed that our office is managing IV antibiotics for endocarditis.   Patient has appointment with IR this morning for a thoracentesis. Called patient to further assess, goes straight to voicemail. Left HIPAA compliant voicemail requesting callback.   Tried reaching patient's emergency contact, [name], no answer. Left HIPAA compliant voicemail requesting callback.   Innocence [phone]  Mekhi Sonn D Kaina Orengo, RN

## 2023-04-06 NOTE — Procedures (Signed)
 PROCEDURE SUMMARY:  Successful US  guided left thoracentesis. Yielded 1.5 L of amber fluid. Patient tolerated procedure well. No immediate complications. EBL = trace  Post procedure chest X-ray reveals no pneumothorax  Lavanda JAYSON Jurist PA-C 04/06/2023 11:58 AM

## 2023-04-06 NOTE — Telephone Encounter (Signed)
 Yes! Hangs out in the 7 range. Thanks!

## 2023-04-06 NOTE — Telephone Encounter (Signed)
 [name] returned call, states Yaasir saw surgery PA yesterday for these concerns and they arranged the thoracentesis for this morning.   He will also follow up with them again next week.   Petrona Wyeth D Alda Gaultney, RN

## 2023-04-06 NOTE — Telephone Encounter (Signed)
 Called Innocence back to relay update, no answer and mailbox full.   Kaidan Harpster D Alek Borges, RN

## 2023-04-09 ENCOUNTER — Other Ambulatory Visit: Payer: Self-pay | Admitting: Thoracic Surgery (Cardiothoracic Vascular Surgery)

## 2023-04-09 ENCOUNTER — Ambulatory Visit (HOSPITAL_COMMUNITY)
Admission: RE | Admit: 2023-04-09 | Discharge: 2023-04-09 | Disposition: A | Discharge status: Home / Self Care | Payer: 59 | Source: Ambulatory Visit | Attending: Thoracic Surgery (Cardiothoracic Vascular Surgery) | Admitting: Thoracic Surgery (Cardiothoracic Vascular Surgery)

## 2023-04-09 ENCOUNTER — Telehealth: Payer: Self-pay

## 2023-04-09 ENCOUNTER — Other Ambulatory Visit: Payer: Self-pay | Admitting: Physician Assistant

## 2023-04-09 ENCOUNTER — Other Ambulatory Visit: Payer: Self-pay

## 2023-04-09 ENCOUNTER — Ambulatory Visit (INDEPENDENT_AMBULATORY_CARE_PROVIDER_SITE_OTHER): Payer: 59 | Admitting: Internal Medicine

## 2023-04-09 ENCOUNTER — Encounter: Payer: Self-pay | Admitting: Internal Medicine

## 2023-04-09 VITALS — BP 135/74 | HR 102 | Temp 97.5°F | Ht 71.0 in | Wt 186.0 lb

## 2023-04-09 DIAGNOSIS — J90 Pleural effusion, not elsewhere classified: Secondary | ICD-10-CM | POA: Insufficient documentation

## 2023-04-09 DIAGNOSIS — I509 Heart failure, unspecified: Secondary | ICD-10-CM | POA: Insufficient documentation

## 2023-04-09 DIAGNOSIS — Z452 Encounter for adjustment and management of vascular access device: Secondary | ICD-10-CM | POA: Diagnosis not present

## 2023-04-09 DIAGNOSIS — Z952 Presence of prosthetic heart valve: Secondary | ICD-10-CM | POA: Diagnosis present

## 2023-04-09 DIAGNOSIS — Z48812 Encounter for surgical aftercare following surgery on the circulatory system: Secondary | ICD-10-CM | POA: Diagnosis present

## 2023-04-09 DIAGNOSIS — R0602 Shortness of breath: Secondary | ICD-10-CM | POA: Insufficient documentation

## 2023-04-09 DIAGNOSIS — I071 Rheumatic tricuspid insufficiency: Secondary | ICD-10-CM | POA: Insufficient documentation

## 2023-04-09 DIAGNOSIS — Z5181 Encounter for therapeutic drug level monitoring: Secondary | ICD-10-CM | POA: Insufficient documentation

## 2023-04-09 DIAGNOSIS — I330 Acute and subacute infective endocarditis: Secondary | ICD-10-CM

## 2023-04-09 DIAGNOSIS — I3139 Other pericardial effusion (noninflammatory): Secondary | ICD-10-CM | POA: Insufficient documentation

## 2023-04-09 LAB — ECHOCARDIOGRAM COMPLETE
AR max vel: 1.84 cm2
AV Area VTI: 2.07 cm2
AV Area mean vel: 1.74 cm2
AV Mean grad: 17 mmHg
AV Peak grad: 31.4 mmHg
Ao pk vel: 2.8 m/s
Calc EF: 73.1 %
MV VTI: 2.46 cm2
S' Lateral: 2.3 cm
Single Plane A2C EF: 75.6 %
Single Plane A4C EF: 68.8 %

## 2023-04-09 MED ORDER — PREDNISONE 10 MG (21) PO TBPK
ORAL_TABLET | ORAL | 0 refills | Status: DC
Start: 2023-04-09 — End: 2023-05-24

## 2023-04-09 NOTE — Progress Notes (Unsigned)
 The echocardiogram today showed a large pericardial effusion with evidence of pulmonary hypertension but no signs of tamponade. Findings discussed with Dr. Luna Salinas.  Mr. Thomas [name] is already on the schedule for office follow up with us  tomorrow.   Will also start Prednisone  taper. [facility] [place]  I phoned Mr. Thomas [name] to let him know about the prescription but there was no answer. Left voice mail and will also send a message to his friend Charleston Conrad) who has been assisting him with follow up appointments.   Gretel Leaven, PA-C

## 2023-04-09 NOTE — Telephone Encounter (Signed)
 Per Dr. Seymour Dapper end date for IV abx 04/13/23 and picc can be removed after last dose.  Updated message sent to [employer].  Jill Stopka Adel Holt, CMA

## 2023-04-09 NOTE — Assessment & Plan Note (Signed)
 PICC line in place and working well.  Will have home health notified to take this out after treatment completion.

## 2023-04-09 NOTE — Assessment & Plan Note (Signed)
 Doing well with antibiotics and no concerns on labs noted.

## 2023-04-09 NOTE — Progress Notes (Signed)
   Subjective:    Patient ID: Thomas [name], male    DOB: 24-Mar-1960, 63 y.o.   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 He was hospitalized last month and found to have dual valve endocarditis and ended up with valve replacement by Dr. Luna Salinas.  He [name] been on postoperative IV penicillin  continuous infusion since the surgery.  Valve culture remain negative and path report noted valvular tissue with acute endocarditis, necrosis and vegetations both valves.  In for 4 weeks postoperative penicillin  through February 14.  He is having no rash or diarrhea.  He did require thoracentesis yesterday due to effusion and [name] continued to have some shortness of breath.  An echo done today and [name] follow-up with T CTS tomorrow.   Review of Systems  Constitutional:  Negative for chills and fever.       Objective:   Physical Exam Eyes:     General: No scleral icterus. Pulmonary:     Effort: Pulmonary effort is normal.  Neurological:     Mental Status: He is alert.   SH: no tobacco        Assessment & Plan:

## 2023-04-09 NOTE — Assessment & Plan Note (Signed)
 At this time, he is stable from an infection standpoint and we will plan to continue with penicillin  as planned through February 14 and then have it stopped after that.  He understands the plan and will notify home health.  He is having difficulty getting to appointments as he has come from a distance so I offered for him to follow-up as needed and he will continue to follow-up with cardiology and cardiothoracic surgery.

## 2023-04-10 ENCOUNTER — Ambulatory Visit: Payer: 59 | Admitting: Thoracic Surgery (Cardiothoracic Vascular Surgery)

## 2023-04-10 ENCOUNTER — Other Ambulatory Visit: Payer: Self-pay | Admitting: Thoracic Surgery (Cardiothoracic Vascular Surgery)

## 2023-04-10 ENCOUNTER — Ambulatory Visit: Payer: Self-pay | Admitting: Surgical

## 2023-04-10 ENCOUNTER — Ambulatory Visit: Payer: 59

## 2023-04-10 ENCOUNTER — Ambulatory Visit
Admission: RE | Admit: 2023-04-10 | Discharge: 2023-04-10 | Disposition: A | Discharge status: Home / Self Care | Payer: 59 | Source: Ambulatory Visit | Attending: Thoracic Surgery (Cardiothoracic Vascular Surgery) | Admitting: Thoracic Surgery (Cardiothoracic Vascular Surgery)

## 2023-04-10 VITALS — BP 158/93 | HR 104 | Temp 97.5°F | Resp 18 | Ht 71.0 in | Wt 190.0 lb

## 2023-04-10 DIAGNOSIS — I3139 Other pericardial effusion (noninflammatory): Secondary | ICD-10-CM

## 2023-04-10 DIAGNOSIS — Z952 Presence of prosthetic heart valve: Secondary | ICD-10-CM

## 2023-04-10 DIAGNOSIS — J90 Pleural effusion, not elsewhere classified: Secondary | ICD-10-CM

## 2023-04-10 NOTE — Telephone Encounter (Signed)
Per Pam she will pass the pull picc orders along to the [facility]. They are the only provider in network with [facility] plans therefore we had to cross refer in the hospital.   Thomas [name] Jonathon Resides, CMA

## 2023-04-10 NOTE — Progress Notes (Signed)
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   PROCEDURE:   Median sternotomy, extracorporeal circulation,  Mitral valve replacement using 31/33 Onyx mechanical valve, serial number [id number],  Aortic valve replacement using 21 mm Onyx mechanical valve, serial number [id number].   SURGEON:  Salvatore Decent. Dorris Fetch, MD   ASSISTANT:  Lowella Dandy, PA   History of Present Illness:    The patient is a 63 year old male seen in the office on today's date in follow-up.  He was last seen in our office on 04/05/2023 and at that time was having difficulty with shortness of breath.  He was noted to have a moderate left greater than right pleural effusion and has subsequently undergone left-sided thoracentesis where they drained 1500 cc in the interventional radiology department.  Generally he feels improved since this procedure and currently denies any shortness of breath.  He was resumed at that time on Lasix at 40 mg twice daily and does note increase in lower extremity edema.  Echocardiogram was performed yesterday and there was concern that he had a large pericardial effusion so appointment was scheduled for today.  He was seen yesterday in the ID office and is scheduled to complete his antibiotics on 04/13/2023 at which time his PICC line will be removed.  He is also scheduled to see cardiology and Coumadin clinic on 04/13/2023.  His most recent INR is 2.3 on 04/02/2023 but he did  miss a couple appointments to get that checked.      Past Medical History:  Diagnosis Date   Blood dyscrasia    GERD (gastroesophageal reflux disease)    History of kidney stones    Hypertension    Thrombocytopenia (HCC) 04/12/2016     Social History   Tobacco Use  Smoking Status Never  Smokeless Tobacco Never    Social History   Substance and Sexual Activity  Alcohol Use Yes   Comment: occasionally     Allergies  Allergen Reactions   Hydrocodone Rash    Current Outpatient Medications  Medication Sig Dispense Refill   acetaminophen (TYLENOL) 325 MG tablet Take 2 tablets (650 mg total) by mouth every 4 (four) hours as needed for mild pain (pain score 1-3) or fever.     ALPRAZolam (XANAX) 1 MG tablet Take 1 mg by mouth at bedtime as needed for anxiety.     amLODipine (NORVASC) 10 MG tablet Take 1 tablet (10 mg total) by mouth daily. 30 tablet 3   atorvastatin (LIPITOR) 40 MG tablet Take 1 tablet (40 mg total) by mouth daily. 30 tablet 3   furosemide (LASIX) 40 MG tablet Take 1 tablet (40 mg total) by mouth 2 (two) times daily. 14 tablet 0   magnesium oxide (MAG-OX) 400 (240 Mg) MG tablet Take 1 tablet (400 mg total) by mouth 2 (two) times daily. 20 tablet 0   olmesartan (BENICAR) 40 MG tablet Take 40  mg by mouth daily.     omeprazole (PRILOSEC OTC) 20 MG tablet Take 20 mg by mouth daily.     penicillin G IVPB Inject 24 Million Units into the vein daily for 24 days. As a continuous infusion Indication:  Endocarditis First Dose: Yes Last Day of Therapy:  04/13/23 Labs - Once weekly:  CBC/D and BMP, Labs - Once weekly: ESR and CRP Method of administration: Elastomeric (Continuous infusion) Method of administration may be changed at the discretion of home infusion pharmacist based upon assessment of the patient and/or caregiver's ability to self-administer the medication ordered. 24 Units 0   potassium chloride SA (KLOR-CON M) 20 MEQ tablet Take 1 tablet (20 mEq total) by  mouth 2 (two) times daily. 14 tablet 0   predniSONE (STERAPRED UNI-PAK 21 TAB) 10 MG (21) TBPK tablet On day 1 (today) take 6 tablets by mouth.  On day 2 take 5 tablets by mouth. On day 3 take 4 tablets by mouth. On day 4 take 3 tablets by mouth. On day 5 take 2 tablets by mouth. On day 6 take 1 tablet by mouth then stop. 21 tablet 0   warfarin (COUMADIN) 4 MG tablet Take 1 tablet (4 mg total) by mouth daily at 4 PM. As directed by coumadin clinic 100 tablet 1   No current facility-administered medications for this visit.       Physical Exam: BP (!) 158/93   Pulse (!) 104   Temp (!) 97.5 F (36.4 C)   Resp 18   Ht 5\' 11"  (1.803 m)   Wt 190 lb (86.2 kg)   SpO2 95% Comment: RA  BMI 26.50 kg/m   General appearance: alert, cooperative, and no distress Heart: regular rate and rhythm and tachycardic, no rub Lungs: Mildly diminished in the bases Abdomen: Benign Extremities: Bilateral lower extremity pitting edema Wound: Incisions healing well without evidence of infection   Diagnostic Studies & Laboratory data:     Recent Radiology Findings:   ECHOCARDIOGRAM COMPLETE Result Date: 04/09/2023    ECHOCARDIOGRAM REPORT   Patient Name:   Thomas [name] [facility] Date of Exam: 04/09/2023 Medical Rec #:  [id number]         Height:       71.0 in Accession #:    [id number]        Weight:       179.0 lb Date of Birth:  Jan 28, 1961         BSA:          2.011 m Patient Age:    62 years          BP:           139/77 mmHg Patient Gender: M                 HR:           95 bpm. Exam Location:  Outpatient Procedure: 2D Echo, Cardiac Doppler and Color Doppler Indications:    Pericardial Effusion  History:        Patient has prior history of Echocardiogram examinations, most                 recent 03/16/2023. CHF; Endocarditis, Mitral Valve Disease and                 Aortic Valve Disease.  Sonographer:    Amy Chionchio Referring Phys: [id number] STEVEN C HENDRICKSON IMPRESSIONS  1. The mitral valve has been  repaired/replaced. Trivial mitral valve regurgitation.  The mean mitral valve gradient is 8.0 mmHg.  2. Left ventricular ejection fraction, by estimation, is 65 to 70%. The left ventricle has normal function. The left ventricle has no regional wall motion abnormalities. There is mild concentric left ventricular hypertrophy. Left ventricular diastolic parameters are indeterminate.  3. Right ventricular systolic function is low normal. The right ventricular size is moderately enlarged. There is mildly elevated pulmonary artery systolic pressure. The estimated right ventricular systolic pressure is 38.0 mmHg.  4. Left atrial size was severely dilated.  5. Right atrial size was severely dilated.  6. IVC is plethoric. No hypotension or tachycardia on sonographer assessment. Large pericardial effusion. The pericardial effusion is circumferential. There is no evidence of cardiac tamponade. Moderate pleural effusion in the left lateral region.  7. Tricuspid valve regurgitation is mild to moderate.  8. The aortic valve has been repaired/replaced. Aortic valve regurgitation is trivial. Aortic valve mean gradient measures 17.0 mmHg.  9. The inferior vena cava is dilated in size with <50% respiratory variability, suggesting right atrial pressure of 15 mmHg. Comparison(s): Prior images reviewed side by side. Pericardial effusion is new from operative TEE. Reaching our to patient primary cardiologist. FINDINGS  Left Ventricle: Left ventricular ejection fraction, by estimation, is 65 to 70%. The left ventricle has normal function. The left ventricle has no regional wall motion abnormalities. The left ventricular internal cavity size was small. There is mild concentric left ventricular hypertrophy. Left ventricular diastolic parameters are indeterminate. Right Ventricle: The right ventricular size is moderately enlarged. No increase in right ventricular wall thickness. Right ventricular systolic function is low normal. There is  mildly elevated pulmonary artery systolic pressure. The tricuspid regurgitant  velocity is 2.74 m/s, and with an assumed right atrial pressure of 8 mmHg, the estimated right ventricular systolic pressure is 38.0 mmHg. Left Atrium: Left atrial size was severely dilated. Right Atrium: Right atrial size was severely dilated. Pericardium: IVC is plethoric. No hypotension or tachycardia on sonographer assessment. A large pericardial effusion is present. The pericardial effusion is circumferential. There is no evidence of cardiac tamponade. Mitral Valve: The mitral valve has been repaired/replaced. Trivial mitral valve regurgitation. MV peak gradient, 12.1 mmHg. The mean mitral valve gradient is 8.0 mmHg with average heart rate of 87 bpm. Tricuspid Valve: The tricuspid valve is normal in structure. Tricuspid valve regurgitation is mild to moderate. Aortic Valve: The aortic valve has been repaired/replaced. Aortic valve regurgitation is trivial. Aortic valve mean gradient measures 17.0 mmHg. Aortic valve peak gradient measures 31.4 mmHg. Aortic valve area, by VTI measures 2.07 cm. Pulmonic Valve: The pulmonic valve was grossly normal. Pulmonic valve regurgitation is trivial. No evidence of pulmonic stenosis. Aorta: The aortic root, ascending aorta and aortic arch are all structurally normal, with no evidence of dilitation or obstruction. Venous: The inferior vena cava is dilated in size with less than 50% respiratory variability, suggesting right atrial pressure of 15 mmHg. IAS/Shunts: The atrial septum is grossly normal. Additional Comments: There is a moderate pleural effusion in the left lateral region.  LEFT VENTRICLE PLAX 2D LVIDd:         3.30 cm LVIDs:         2.30 cm LV PW:         1.20 cm LV IVS:        1.20 cm LVOT diam:     2.10 cm LV SV:         92 LV SV Index:   46 LVOT Area:  3.46 cm  LV Volumes (MOD) LV vol d, MOD A2C: 126.0 ml LV vol d, MOD A4C: 134.0 ml LV vol s, MOD A2C: 30.8 ml LV vol s, MOD A4C:  41.8 ml LV SV MOD A2C:     95.2 ml LV SV MOD A4C:     134.0 ml LV SV MOD BP:      95.9 ml RIGHT VENTRICLE            IVC RV Basal diam:  4.40 cm    IVC diam: 2.20 cm RV Mid diam:    3.90 cm RV S prime:     9.68 cm/s TAPSE (M-mode): 1.3 cm LEFT ATRIUM              Index        RIGHT ATRIUM           Index LA Vol (A2C):   103.0 ml 51.21 ml/m  RA Area:     24.80 cm LA Vol (A4C):   188.0 ml 93.46 ml/m  RA Volume:   94.80 ml  47.13 ml/m LA Biplane Vol: 150.0 ml 74.57 ml/m  AORTIC VALVE                     PULMONIC VALVE AV Area (Vmax):    1.84 cm      PV Vmax:          1.38 m/s AV Area (Vmean):   1.74 cm      PV Peak grad:     7.6 mmHg AV Area (VTI):     2.07 cm      PR End Diast Vel: 3.96 msec AV Vmax:           280.00 cm/s AV Vmean:          197.000 cm/s AV VTI:            0.447 m AV Peak Grad:      31.4 mmHg AV Mean Grad:      17.0 mmHg LVOT Vmax:         149.00 cm/s LVOT Vmean:        99.200 cm/s LVOT VTI:          0.267 m LVOT/AV VTI ratio: 0.60  AORTA Ao Asc diam: 3.30 cm MITRAL VALVE              TRICUSPID VALVE MV Area VTI:  2.46 cm    TR Peak grad:   30.0 mmHg MV Peak grad: 12.1 mmHg   TR Vmax:        274.00 cm/s MV Mean grad: 8.0 mmHg MV Vmax:      1.74 m/s    SHUNTS MV Vmean:     137.0 cm/s  Systemic VTI:  0.27 m                           Systemic Diam: 2.10 cm Riley Lam MD Electronically signed by Riley Lam MD Signature Date/Time: 04/09/2023/10:00:18 AM    Final       Recent Lab Findings: Lab Results  Component Value Date   WBC 5.1 03/26/2023   HGB 7.7 (L) 03/26/2023   HCT 23.8 (L) 03/26/2023   PLT 224 03/26/2023   GLUCOSE 95 03/24/2023   CHOL 50 03/17/2023   TRIG 38 03/17/2023   HDL 13 (L) 03/17/2023   LDLCALC 29 03/17/2023   ALT 34 03/08/2023   AST 79 (H) 03/08/2023  NA 130 (L) 03/24/2023   K 4.1 03/24/2023   CL 103 03/24/2023   CREATININE 0.61 03/24/2023   BUN 10 03/24/2023   CO2 22 03/24/2023   TSH 3.267 03/08/2023   INR 2.3 (H) 04/05/2023   HGBA1C  5.0 03/17/2023      Assessment / Plan: The patient was seen with Dr. Dorris Fetch.  He reviewed the echocardiogram and feels that the pericardial effusion is more in the moderate range.  There is no evidence of tamponade on the study.  He did get significant benefit from the left thoracentesis in regard to his symptoms.  Will continue the Lasix and he was scheduled to see cardiology on Friday as well as the Coumadin clinic.  Here was advised of the importance of not missing any of these appointments.  From a CT surgical perspective the plan is to get a repeat echocardiogram in 1 week and a follow-up appointment in 2 weeks.  He has also been started on a oral steroid Dosepak.  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  He is not felt to require pericardial window at this time.      Medication Changes: No orders of the defined types were placed in this encounter.     Rowe Clack, PA-C  04/10/2023 2:46 PM

## 2023-04-10 NOTE — Patient Instructions (Signed)
Continue to take your medications as prescribed.  Keep legs elevated frequently.  Keep all appointments especially with cardiology and Coumadin clinic this week.[name]

## 2023-04-11 ENCOUNTER — Other Ambulatory Visit: Payer: Self-pay | Admitting: Thoracic Surgery (Cardiothoracic Vascular Surgery)

## 2023-04-11 DIAGNOSIS — I3139 Other pericardial effusion (noninflammatory): Secondary | ICD-10-CM

## 2023-04-13 ENCOUNTER — Ambulatory Visit (INDEPENDENT_AMBULATORY_CARE_PROVIDER_SITE_OTHER): Payer: 59

## 2023-04-13 ENCOUNTER — Ambulatory Visit: Payer: 59 | Attending: Physician Assistant | Admitting: Physician Assistant

## 2023-04-13 ENCOUNTER — Encounter: Payer: Self-pay | Admitting: Physician Assistant

## 2023-04-13 VITALS — BP 136/72 | HR 97 | Ht 71.0 in | Wt 193.0 lb

## 2023-04-13 DIAGNOSIS — Z7901 Long term (current) use of anticoagulants: Secondary | ICD-10-CM | POA: Diagnosis not present

## 2023-04-13 DIAGNOSIS — Z952 Presence of prosthetic heart valve: Secondary | ICD-10-CM

## 2023-04-13 DIAGNOSIS — R600 Localized edema: Secondary | ICD-10-CM | POA: Diagnosis not present

## 2023-04-13 DIAGNOSIS — I2510 Atherosclerotic heart disease of native coronary artery without angina pectoris: Secondary | ICD-10-CM | POA: Diagnosis not present

## 2023-04-13 DIAGNOSIS — I3139 Other pericardial effusion (noninflammatory): Secondary | ICD-10-CM

## 2023-04-13 DIAGNOSIS — E785 Hyperlipidemia, unspecified: Secondary | ICD-10-CM

## 2023-04-13 DIAGNOSIS — I330 Acute and subacute infective endocarditis: Secondary | ICD-10-CM

## 2023-04-13 DIAGNOSIS — J90 Pleural effusion, not elsewhere classified: Secondary | ICD-10-CM

## 2023-04-13 DIAGNOSIS — D649 Anemia, unspecified: Secondary | ICD-10-CM

## 2023-04-13 DIAGNOSIS — I442 Atrioventricular block, complete: Secondary | ICD-10-CM

## 2023-04-13 DIAGNOSIS — Z79899 Other long term (current) drug therapy: Secondary | ICD-10-CM

## 2023-04-13 LAB — POCT INR: INR: 2 (ref 2.0–3.0)

## 2023-04-13 LAB — LAB REPORT - SCANNED
Calcium: 8.4
EGFR: 101

## 2023-04-13 NOTE — Patient Instructions (Signed)
Medication Instructions:  TAKE EXTRA DOSE OF LASIX FOR NEXT 3 DAYS THEN BACK TO NORMAL DOSAGE  *If you need a refill on your cardiac medications before your next appointment, please call your pharmacy*   Lab Work: CBC AND BMP ON 04/19/23 If you have labs (blood work) drawn today and your tests are completely normal, you will receive your results only by: MyChart Message (if you have MyChart) OR A paper copy in the mail If you have any lab test that is abnormal or we need to change your treatment, we will call you to review the results.   Testing/Procedures: NO TESTING   Follow-Up: At [facility], you and your health needs are our priority.  As part of our continuing mission to provide you with exceptional heart care, we have created designated Provider Care Teams.  These Care Teams include your primary Cardiologist (physician) and Advanced Practice Providers (APPs -  Physician Assistants and Nurse Practitioners) who all work together to provide you with the care you need, when you need it.  Your next appointment:   2 week(s)  Provider:   Azalee Course, PA

## 2023-04-13 NOTE — Patient Instructions (Signed)
INCREASE TO  1 tablet daily EXCEPT 1.5 tablets on Mondays and Fridays INR in 1 week  A full discussion of the nature of anticoagulants has been carried out.  A benefit risk analysis has been presented to the patient, so that they understand the justification for choosing anticoagulation at this time. The need for frequent and regular monitoring, precise dosage adjustment and compliance is stressed.  Side effects of potential bleeding are discussed.  The patient should avoid any OTC items containing aspirin or ibuprofen, and should avoid great swings in general diet.  Avoid alcohol consumption.  Call if any signs of abnormal bleeding.  [phone]

## 2023-04-13 NOTE — Progress Notes (Signed)
 Cardiology Office Note:  .   Date:  04/13/2023  ID:  Thomas [name], DOB 01-29-1961, MRN [id number] PCP: Thomas Found, MD  [facility] Providers Cardiologist:  Thomas Bollman, MD     History of Present Illness: .   Thomas [name] is a 63 y.o. male with PMH of HTN, left nephrectomy at age 41 (due to kidney stones?), remote substance abuse (cocaine, snort, never injected, last use 2010), anxiety, GERD and thrombocytopenia. He underwent R write ulnar shortening osteotomy in August 2024. He later developed hardware failure and osteolysis requiring plate removal and bone biopsy in Dec 2024. Culture was negative. 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 Since his procedure, he has developed intermittent fever. He presented to the hospital on 03/07/2022 with CHF. TTE on 03/08/2022 showed EF greater than 75%, severe AI with linear shaped echodensity measuring 2 cm x 0.35 cm concerning for endocarditis. 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 EF was 60 to 65%. Blood culture came back positive for Streptococcus infantarius (formerly known as strep bovis). He was treated empiric abx. He was seen by Dr. Dorris [name] for consideration of valve replacement surgery. Pre-surgical cath on 03/13/2023 showed 60% prox to mid LAD lesion, 60% stenosis in side branch of D1, 30% mid to distal LAD, 50% distal LAD. Doppler obtained on 1/15 showed 1-39% bilateral ICA disease. He underwent 21 mm Onyx Mechanical AVR and 31-34mm Onyx Mechanical MVR on 03/16/2023 by Dr. Dorris [name]. Post procedure, telemetry showed nonspecific intraventricular conduction delay followed by accelerated junctional rhythm. Beta blocker was not started. He was seen by Dr. Nelly [name] who recommended continue monitoring. CHB resolved, EP decided to hold off PPM implant. He did have postop afib.  He remainted on PCN G for strep species. He had left thoracentesis with removal of 1200 cc fluid on 1/24. 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  Since discharged, he underwent repeat L thoracentesis with removal of 1.5 L on 04/06/2023. Repeat echo 04/09/2023 showed stable aortic and mitral valve replacement, EF 65-70%, no RWMA, RVSP 38 mmHg,  severe biatrial enlargement, large pericardial effusion and moderate left pleural effusion. He is pending repeat echocardiogram on 2/19.   He presents today for post hospital follow up along with wife. He is on 40mg  BID lasix, on exam, he has at least 3+ pitting edema in bilateral LE. I asked him to increase lasix to 80mg  AM and 40mg  PM for 3 days before going back to 40mg  BID. He will need a CBC and BMET on 2/20 when he returns for coumadin clinic. He still gets IV abx, however his PICC line will be discontinued soon. He will have close outpatient follow up with Dr. Dorris [name]. I plan to see the patient back in 2 weeks.    ROS:   Patient has lower extremity edema, shortness of breath, he denies any significant chest pain.  Studies Reviewed: .        Cardiac Studies & Procedures   ______________________________________________________________________________________________ CARDIAC CATHETERIZATION  CARDIAC CATHETERIZATION 03/13/2023  Narrative Images from the original result were not included.    Moderate Single-Vessel Disease: Prox LAD to Mid LAD lesion is 60% stenosed with 60% stenosed side branch in 1st Diag. Mid LAD to Dist LAD lesion is 30% stenosed.  Dist LAD lesion is 50% stenosed.  Dominance: Right  The patient was hypotensive with central aortic pressures in the 70s to 80s during the procedure.  This improved with 500 mL bolus.  Post procedural radial arterial pressures were in the 110-115/48-50 mmHg.  This differs by at least 30 to 35 mmHg from the femoral arterial  blood pressure cuff.   RECOMMENDATIONS Patient will return to the nursing floor for ongoing care with CVTS consultation. LAD lesions are not likely flow-limiting and were not causing symptoms prior to this hospitalization.  Based on the extent of operation, would consider medical therapy with PCI at a later date if indicated by symptoms/stress test. Restart aspirin 81 mg daily  Thomas Lemma, MD  Findings Coronary Findings Diagnostic  Dominance: Right  Left Main Vessel was injected. Vessel is large.  Left Anterior Descending Vessel is large. Prox LAD to Mid LAD lesion is 60% stenosed with 60% stenosed side branch in 1st Diag. Mid LAD to Dist LAD lesion is 30% stenosed. Dist LAD lesion is 50% stenosed.  Second Diagonal Branch Vessel is moderate in size.  Left Circumflex Vessel is large. The vessel courses distally is a large lateral OM branch.  First Obtuse Marginal Branch Vessel is small in size.  Second Obtuse Marginal Branch Vessel is moderate in size.  Lateral Second Obtuse Marginal Branch Vessel is small in size.  Fourth Obtuse Marginal Branch Vessel is small in size.  Left Posterior Atrioventricular Artery Vessel is small in size.  Right Coronary Artery Vessel was injected. Vessel is large. The vessel exhibits minimal luminal irregularities.  Right Ventricular Branch Vessel is small in size.  Inferior Septal Vessel is small in size.  First Right Posterolateral Branch Vessel is small in size.  Second Right Posterolateral Branch Vessel is small in size.  Intervention  No interventions have been documented.     ECHOCARDIOGRAM  ECHOCARDIOGRAM COMPLETE 04/09/2023  Narrative ECHOCARDIOGRAM REPORT    Patient Name:   Thomas [name] [facility] Date of Exam: 04/09/2023 Medical Rec #:  [id number]         Height:       71.0 in Accession #:    [id number]        Weight:       179.0 lb Date of Birth:  05/07/1960         BSA:          2.011 m Patient Age:    62  years          BP:           139/77 mmHg Patient Gender: M                 HR:           95 bpm. Exam Location:  Outpatient  Procedure: 2D Echo, Cardiac Doppler and Color Doppler  Indications:    Pericardial Effusion  History:        Patient has prior history of Echocardiogram examinations, most recent 03/16/2023. CHF; Endocarditis, Mitral Valve Disease and Aortic Valve Disease.  Sonographer:    Amy Chionchio Referring Phys: [id number] STEVEN C HENDRICKSON  IMPRESSIONS   1. The mitral valve has been repaired/replaced. Trivial mitral valve regurgitation. The mean mitral valve gradient is 8.0 mmHg. 2. Left ventricular ejection fraction, by estimation, is 65 to 70%. The left ventricle has normal function. The left ventricle has no regional wall motion abnormalities. There is mild concentric left ventricular hypertrophy. Left ventricular diastolic parameters are indeterminate. 3. Right ventricular systolic function is low normal. The right ventricular size  is moderately enlarged. There is mildly elevated pulmonary artery systolic pressure. The estimated right ventricular systolic pressure is 38.0 mmHg. 4. Left atrial size was severely dilated. 5. Right atrial size was severely dilated. 6. IVC is plethoric. No hypotension or tachycardia on sonographer assessment. Large pericardial effusion. The pericardial effusion is circumferential. There is no evidence of cardiac tamponade. Moderate pleural effusion in the left lateral region. 7. Tricuspid valve regurgitation is mild to moderate. 8. The aortic valve has been repaired/replaced. Aortic valve regurgitation is trivial. Aortic valve mean gradient measures 17.0 mmHg. 9. The inferior vena cava is dilated in size with <50% respiratory variability, suggesting right atrial pressure of 15 mmHg.  Comparison(s): Prior images reviewed side by side. Pericardial effusion is new from operative TEE. Reaching our to patient primary cardiologist.  FINDINGS Left  Ventricle: Left ventricular ejection fraction, by estimation, is 65 to 70%. The left ventricle has normal function. The left ventricle has no regional wall motion abnormalities. The left ventricular internal cavity size was small. There is mild concentric left ventricular hypertrophy. Left ventricular diastolic parameters are indeterminate.  Right Ventricle: The right ventricular size is moderately enlarged. No increase in right ventricular wall thickness. Right ventricular systolic function is low normal. There is mildly elevated pulmonary artery systolic pressure. The tricuspid regurgitant velocity is 2.74 m/s, and with an assumed right atrial pressure of 8 mmHg, the estimated right ventricular systolic pressure is 38.0 mmHg.  Left Atrium: Left atrial size was severely dilated.  Right Atrium: Right atrial size was severely dilated.  Pericardium: IVC is plethoric. No hypotension or tachycardia on sonographer assessment. A large pericardial effusion is present. The pericardial effusion is circumferential. There is no evidence of cardiac tamponade.  Mitral Valve: The mitral valve has been repaired/replaced. Trivial mitral valve regurgitation. MV peak gradient, 12.1 mmHg. The mean mitral valve gradient is 8.0 mmHg with average heart rate of 87 bpm.  Tricuspid Valve: The tricuspid valve is normal in structure. Tricuspid valve regurgitation is mild to moderate.  Aortic Valve: The aortic valve has been repaired/replaced. Aortic valve regurgitation is trivial. Aortic valve mean gradient measures 17.0 mmHg. Aortic valve peak gradient measures 31.4 mmHg. Aortic valve area, by VTI measures 2.07 cm.  Pulmonic Valve: The pulmonic valve was grossly normal. Pulmonic valve regurgitation is trivial. No evidence of pulmonic stenosis.  Aorta: The aortic root, ascending aorta and aortic arch are all structurally normal, with no evidence of dilitation or obstruction.  Venous: The inferior vena cava is dilated in  size with less than 50% respiratory variability, suggesting right atrial pressure of 15 mmHg.  IAS/Shunts: The atrial septum is grossly normal.  Additional Comments: There is a moderate pleural effusion in the left lateral region.   LEFT VENTRICLE PLAX 2D LVIDd:         3.30 cm LVIDs:         2.30 cm LV PW:         1.20 cm LV IVS:        1.20 cm LVOT diam:     2.10 cm LV SV:         92 LV SV Index:   46 LVOT Area:     3.46 cm  LV Volumes (MOD) LV vol d, MOD A2C: 126.0 ml LV vol d, MOD A4C: 134.0 ml LV vol s, MOD A2C: 30.8 ml LV vol s, MOD A4C: 41.8 ml LV SV MOD A2C:     95.2 ml LV SV MOD A4C:     134.0  ml LV SV MOD BP:      95.9 ml  RIGHT VENTRICLE            IVC RV Basal diam:  4.40 cm    IVC diam: 2.20 cm RV Mid diam:    3.90 cm RV S prime:     9.68 cm/s TAPSE (M-mode): 1.3 cm  LEFT ATRIUM              Index        RIGHT ATRIUM           Index LA Vol (A2C):   103.0 ml 51.21 ml/m  RA Area:     24.80 cm LA Vol (A4C):   188.0 ml 93.46 ml/m  RA Volume:   94.80 ml  47.13 ml/m LA Biplane Vol: 150.0 ml 74.57 ml/m AORTIC VALVE                     PULMONIC VALVE AV Area (Vmax):    1.84 cm      PV Vmax:          1.38 m/s AV Area (Vmean):   1.74 cm      PV Peak grad:     7.6 mmHg AV Area (VTI):     2.07 cm      PR End Diast Vel: 3.96 msec AV Vmax:           280.00 cm/s AV Vmean:          197.000 cm/s AV VTI:            0.447 m AV Peak Grad:      31.4 mmHg AV Mean Grad:      17.0 mmHg LVOT Vmax:         149.00 cm/s LVOT Vmean:        99.200 cm/s LVOT VTI:          0.267 m LVOT/AV VTI ratio: 0.60  AORTA Ao Asc diam: 3.30 cm  MITRAL VALVE              TRICUSPID VALVE MV Area VTI:  2.46 cm    TR Peak grad:   30.0 mmHg MV Peak grad: 12.1 mmHg   TR Vmax:        274.00 cm/s MV Mean grad: 8.0 mmHg MV Vmax:      1.74 m/s    SHUNTS MV Vmean:     137.0 cm/s  Systemic VTI:  0.27 m Systemic Diam: 2.10 cm  Riley Lam MD Electronically signed by Riley Lam MD Signature Date/Time: 04/09/2023/10:00:18 AM    Final   TEE  ECHO INTRAOPERATIVE TEE 03/16/2023  Narrative *INTRAOPERATIVE TRANSESOPHAGEAL REPORT *    Patient Name:   DARRELL HAUK [facility] Date of Exam: 03/16/2023 Medical Rec #:  [id number]         Height:       71.0 in Accession #:    [id number]        Weight:       168.7 lb Date of Birth:  December 30, 1960         BSA:          1.96 m Patient Age:    62 years          BP: Patient Gender: M                 HR: Exam Location:  Anesthesiology  Transesophogeal exam was perform intraoperatively during surgical procedure. Patient was closely monitored  under general anesthesia during the entirety of examination.  Indications:     MVR Clement Husbands Performing Phys: Anice Paganini, DO  Complications: No known complications during this procedure. POST-OP IMPRESSIONS _ Left Ventricle: The left ventriclar function remains normal on inotropic support. _ Right Ventricle: The right ventriclar function is normal on inotropic support. _ Aorta: The aorta appears unchanged from pre-bypass. There is no dissection _ Aortic Valve: There is a mechanical valve in the aortic position the valve is well seated with no paravalvular leak. Trace intravalvular regurgiation jets visualized consistent with normal washing jets. Mean PG . Vmax 2.8 m/s _ Mitral Valve: There is a mechanical valve in the mitral position. The valve is well seated with no paravalvular leak. Trace regurgitation jets visualized consistent with normal washing jets. _ Tricuspid Valve: The tricuspid valve appears unchanged from pre-bypass. There is mild TR. _ Pulmonic Valve: The pulmonic valve appears unchanged from pre-bypass. _ Interatrial Septum: The interatrial septum appears intact with no shunt following surgical closure. _ Pericardium: There is no pericardial effusion.  PRE-OP FINDINGS Left Ventricle: The left ventricle has normal systolic function, with an ejection  fraction of 61.5% by Simposon's biplane method of discs. The cavity size was normal.   Right Ventricle: The right ventricle has normal systolic function. The cavity was normal. There is no increase in right ventricular wall thickness. TAPSE 2.34cm  Left Atrium: No left atrial/left atrial appendage thrombus was detected.  Right Atrium: Right atrial pressure is estimated at 20 mmHg.  Interatrial Septum: No atrial level shunt detected by color flow Doppler. There is no evidence of a patent foramen ovale.  Pericardium: There is no evidence of pericardial effusion. There is a moderate pleural effusion in both left and right lateral regions.  Mitral Valve: The mitral valve is normal in structure. There are multiple vegetations noted on the mitral valve leaflets, resulting in severe mitral regurgitation.  Tricuspid Valve: The tricuspid valve was normal in structure. Tricuspid valve regurgitation is trivial by color flow Doppler. There is no evidence of valve vegetations / endocarditis.  Aortic Valve: The aortic valve is trileaflet. The leaflets open well. There is thickening of the leaflets with with vegetation noted. Findings consistent with preop diagnosis of endocarditis in the setting of known bactermia. There is severe aortic insufficiency. No aortic stenosis   Pulmonic Valve: The pulmonic valve was normal in structure. Pulmonic valve regurgitation is trivial by color flow Doppler.   Aorta: The aortic root, ascending aorta, and aortic arch are normal in size and structure. There is no dissection.  +--------------+--------++ LEFT VENTRICLE               +----------------+---------++ +--------------+--------++       Diastology                PLAX 2D                      +----------------+---------++ +--------------+--------++       LV e' lateral:  7.10 cm/s LVOT diam:    2.00 cm        +----------------+---------++ +--------------+--------++       LV E/e'  lateral:25.6      LVOT Area:    3.14 cm       +----------------+---------++ +--------------+--------++                        +--------------+--------++  +------------------+---------++ LV Volumes (MOD)            +------------------+---------++  LV area d, A2C:   51.00 cm +------------------+---------++ LV area d, A4C:   57.30 cm +------------------+---------++ LV area s, A2C:   27.80 cm +------------------+---------++ LV area s, A4C:   32.80 cm +------------------+---------++ LV major d, A2C:  10.50 cm  +------------------+---------++ LV major d, A4C:  10.90 cm  +------------------+---------++ LV major s, A2C:  8.43 cm   +------------------+---------++ LV major s, A4C:  9.07 cm   +------------------+---------++ LV vol d, MOD A2C:206.0 ml  +------------------+---------++ LV vol d, MOD A4C:246.0 ml  +------------------+---------++ LV vol s, MOD A2C:75.3 ml   +------------------+---------++ LV vol s, MOD A4C:96.9 ml   +------------------+---------++ LV SV MOD A2C:    130.7 ml  +------------------+---------++ LV SV MOD A4C:    246.0 ml  +------------------+---------++ LV SV MOD BP:     140.7 ml  +------------------+---------++  +---------------+------+-------+ RIGHT VENTRICLE              +---------------+------+-------+ RV FAC:        28.0 %        +---------------+------+-------+ TAPSE (M-mode):2.3 cm2.37 cm +---------------+------+-------+  +------------------+------------++  +--------------+-----------++ AORTIC VALVE                    PULMONIC VALVE            +------------------+------------++  +--------------+-----------++ AV Area (Vmax):   2.12 cm      PV Vmax:      0.57 m/s    +------------------+------------++  +--------------+-----------++ AV Area (Vmean):  1.97 cm      PV Vmean:     37.600 cm/s +------------------+------------++   +--------------+-----------++ AV Area (VTI):    1.83 cm      PV VTI:       0.122 m     +------------------+------------++  +--------------+-----------++ AV Vmax:          257.50 cm/s   PV Peak grad: 1.3 mmHg    +------------------+------------++  +--------------+-----------++ AV Vmean:         175.500 cm/s  PV Mean grad: 1.0 mmHg    +------------------+------------++  +--------------+-----------++ AV VTI:           0.567 m      +------------------+------------++ AV Peak Grad:     26.5 mmHg    +------------------+------------++ AV Mean Grad:     15.0 mmHg    +------------------+------------++ LVOT Vmax:        174.00 cm/s  +------------------+------------++ LVOT Vmean:       110.000 cm/s +------------------+------------++ LVOT VTI:         0.331 m      +------------------+------------++ LVOT/AV VTI ratio:0.58         +------------------+------------++ AR PHT:           230 msec     +------------------+------------++  +--------------+-------++ AORTA                 +--------------+-------++ Ao Sinus diam:3.20 cm +--------------+-------++ Ao STJ diam:  2.7 cm  +--------------+-------++  +--------------+----------++  +---------------+-----------++ MITRAL VALVE              TRICUSPID VALVE            +--------------+----------++  +---------------+-----------++ MV Area (PHT):4.15 cm    TR Peak grad:  18.0 mmHg   +--------------+----------++  +---------------+-----------++ MV Peak grad: 13.2 mmHg   TR Vmax:       212.00 cm/s +--------------+----------++  +---------------+-----------++ MV Mean grad: 5.0 mmHg   +--------------+----------++  +--------------+-------+ MV Vmax:      1.82 m/s  SHUNTS                +--------------+----------++  +--------------+-------+ MV Vmean:     91.3 cm/s   Systemic VTI: 0.33 m  +--------------+----------++  +--------------+-------+ MV  VTI:       0.36 m      Systemic Diam:2.00 cm +--------------+----------++  +--------------+-------+ MV PHT:       53.07 msec +--------------+----------++ MV Decel Time:183 msec   +--------------+----------++ +--------------+-----------++ MV E velocity:182.00 cm/s +--------------+-----------++ MV A velocity:46.70 cm/s  +--------------+-----------++ MV E/A ratio: 3.90        +--------------+-----------++   Anice Paganini Electronically signed by Anice Paganini Signature Date/Time: 03/16/2023/2:18:30 PM    Final        ______________________________________________________________________________________________      Risk Assessment/Calculations:             Physical Exam:   VS:  BP 136/72 (BP Location: Right Arm, Patient Position: Sitting)   Pulse 97   Ht 5\' 11"  (1.803 m)   Wt 193 lb (87.5 kg)   SpO2 95%   BMI 26.92 kg/m    Wt Readings from Last 3 Encounters:  04/13/23 193 lb (87.5 kg)  04/10/23 190 lb (86.2 kg)  04/09/23 186 lb (84.4 kg)    GEN: Well nourished, well developed in no acute distress NECK: No JVD; No carotid bruits CARDIAC: RRR, no murmurs, rubs, gallops RESPIRATORY:  Clear to auscultation without rales, wheezing or rhonchi  ABDOMEN: Soft, non-tender, non-distended EXTREMITIES:  3+ pitting edema; No deformity   ASSESSMENT AND PLAN: .    History of mechanical aortic valve: Stable on last echocardiogram  History of mechanical mitral valve: Coumadin managed by Coumadin clinic.  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  Blood culture positive for Streptococcus infantarium.  Receiving IV antibiotic via PICC line.  Per ID recommendation, once treated, due to this bacteria see association with GI cancer, patient will need a colonoscopy at some point  Leg edema: He is massively volume overloaded with at least  3+ pitting edema.  Increase Lasix to 80 mg a.m. and 40 mg p.m. for 3 days before going back to 40 mg twice daily dosing.  On potassium supplement as well.  Repeat basic metabolic panel next week  Recurrent left pleural effusion: 2 thoracentesis recently.  However patient continued to have at least moderate left basilar pleural effusion on physical exam.  Pericardial effusion: New large pericardial effusion noted on recent echocardiogram, plan for repeat echocardiogram next week  Nonobstructive CAD: Denies any chest pain  Hyperlipidemia: On Lipitor  Postop complete heart block: Seen by EP service in the hospital.  Self resolved.  Will hold off on pacemaker  Postop anemia: Repeat CBC    Cardiac Rehabilitation Eligibility Assessment  The patient is ready to start cardiac rehabilitation pending clearance from the cardiac surgeon.       Dispo: Follow-up in 2 weeks given the new large pericardial effusion and clear sign of volume overload.  Signed, Azalee Course, PA

## 2023-04-14 ENCOUNTER — Other Ambulatory Visit: Payer: Self-pay | Admitting: Physician Assistant

## 2023-04-14 ENCOUNTER — Telehealth: Payer: Self-pay | Admitting: Physician Assistant

## 2023-04-14 ENCOUNTER — Other Ambulatory Visit: Payer: Self-pay | Admitting: Surgical

## 2023-04-14 MED ORDER — FUROSEMIDE 40 MG PO TABS
40.0000 mg | ORAL_TABLET | Freq: Every day | ORAL | 11 refills | Status: DC
Start: 2023-04-14 — End: 2023-05-10

## 2023-04-14 NOTE — Telephone Encounter (Signed)
 Pt seen in the office yesterday in follow-up after mitral valve replacement and aortic valve replacement on 03/16/2023.  He was significantly volume overloaded on exam and the plan was to increase his Lasix from 40 mg twice daily to 80 mg a.m., 40 mg p.m. for 3 days and then go back to 40 mg twice daily.  However, the prescription had not been sent in.  I was able to send the prescription in, they did not need anything else.

## 2023-04-16 ENCOUNTER — Telehealth: Payer: Self-pay | Admitting: Cardiovascular Disease

## 2023-04-16 DIAGNOSIS — Z79899 Other long term (current) drug therapy: Secondary | ICD-10-CM

## 2023-04-16 DIAGNOSIS — R0602 Shortness of breath: Secondary | ICD-10-CM

## 2023-04-16 DIAGNOSIS — R600 Localized edema: Secondary | ICD-10-CM

## 2023-04-16 NOTE — Telephone Encounter (Signed)
 Pt called to report that he saw Azalee Course PA this past Friday 04/13/23...he is still havin lower extremity edema.worsening but no improvement... this is his 3rd day of his lasix changes...   Leg edema: He is massively volume overloaded with at least 3+ pitting edema.  Increase Lasix to 80 mg a.m. and 40 mg p.m. for 3 days before going back to 40 mg twice daily dosing.  On potassium supplement as well.  Repeat basic metabolic panel next week  He is urinating more...his breathing is about the same.. not worse than usual.. with exertion.   Will send to [facility] to review and if we need to wait longer. He was Unsure about his exact weight but 04/13/23 at our office he was 193 lbs and he thinks today 196 lbs but on his scale.

## 2023-04-16 NOTE — Telephone Encounter (Signed)
 Pt c/o swelling/edema: STAT if pt has developed SOB within 24 hours  If swelling, where is the swelling located? Bi lat edema, states could hardly get shoes off last night  How much weight have you gained and in what time span? 8lbs  Have you gained 2 pounds in a day or 5 pounds in a week? unsure  Do you have a log of your daily weights (if so, list)? No  Are you currently taking a fluid pill? Yes  Are you currently SOB? No  Have you traveled recently in a car or plane for an extended period of time? No

## 2023-04-17 LAB — FUNGUS CULTURE WITH STAIN

## 2023-04-17 LAB — FUNGUS CULTURE RESULT

## 2023-04-17 LAB — FUNGAL ORGANISM REFLEX

## 2023-04-17 NOTE — Telephone Encounter (Signed)
 Pt advised and verbalized understanding... he will have labs 04/19/23 at the [facility] in [place].

## 2023-04-17 NOTE — Telephone Encounter (Signed)
 Let's continue on lasix 80mg  AM and 40mg  PM for now. Obtain BNP and BMP this week on 2/20. Follow up with me in 10 days as scheduled

## 2023-04-18 ENCOUNTER — Other Ambulatory Visit (HOSPITAL_COMMUNITY): Payer: 59

## 2023-04-18 ENCOUNTER — Ambulatory Visit (HOSPITAL_COMMUNITY): Admission: RE | Admit: 2023-04-18 | Payer: 59 | Source: Ambulatory Visit

## 2023-04-19 ENCOUNTER — Ambulatory Visit: Payer: 59

## 2023-04-19 ENCOUNTER — Encounter: Payer: 59 | Admitting: Orthopedic Surgery

## 2023-04-20 ENCOUNTER — Telehealth: Payer: Self-pay | Admitting: Orthopedic Surgery

## 2023-04-20 NOTE — Telephone Encounter (Signed)
 Patient called. Would like to go back to work next week. His cb# [phone]

## 2023-04-22 ENCOUNTER — Other Ambulatory Visit: Payer: Self-pay | Admitting: Physician Assistant

## 2023-04-24 ENCOUNTER — Ambulatory Visit: Payer: 59 | Admitting: Thoracic Surgery (Cardiothoracic Vascular Surgery)

## 2023-04-24 NOTE — Telephone Encounter (Signed)
 Explained to patient that we would like for him to wait until we see him back for follow up before clearing him to go back to work.

## 2023-04-25 ENCOUNTER — Other Ambulatory Visit: Payer: Self-pay | Admitting: Thoracic Surgery (Cardiothoracic Vascular Surgery)

## 2023-04-25 DIAGNOSIS — I3139 Other pericardial effusion (noninflammatory): Secondary | ICD-10-CM

## 2023-04-26 ENCOUNTER — Ambulatory Visit (HOSPITAL_COMMUNITY): Payer: 59 | Attending: Cardiovascular Disease

## 2023-04-26 DIAGNOSIS — I3139 Other pericardial effusion (noninflammatory): Secondary | ICD-10-CM | POA: Diagnosis present

## 2023-04-26 LAB — ECHOCARDIOGRAM COMPLETE
AR max vel: 1.99 cm2
AV Area VTI: 1.83 cm2
AV Area mean vel: 1.89 cm2
AV Mean grad: 21 mmHg
AV Peak grad: 33.2 mmHg
Ao pk vel: 2.88 m/s
Area-P 1/2: 3.6 cm2
MV VTI: 2.46 cm2
S' Lateral: 2.56 cm

## 2023-04-27 ENCOUNTER — Encounter: Payer: Self-pay | Admitting: Thoracic Surgery (Cardiothoracic Vascular Surgery)

## 2023-04-27 ENCOUNTER — Telehealth: Payer: Self-pay | Admitting: Pharmacist

## 2023-04-27 ENCOUNTER — Other Ambulatory Visit: Payer: Self-pay | Admitting: Physician Assistant

## 2023-04-27 ENCOUNTER — Other Ambulatory Visit: Payer: Self-pay

## 2023-04-27 ENCOUNTER — Ambulatory Visit: Payer: 59 | Attending: Physician Assistant | Admitting: Physician Assistant

## 2023-04-27 ENCOUNTER — Ambulatory Visit: Payer: 59 | Admitting: Thoracic Surgery (Cardiothoracic Vascular Surgery)

## 2023-04-27 ENCOUNTER — Ambulatory Visit
Admission: RE | Admit: 2023-04-27 | Discharge: 2023-04-27 | Disposition: A | Discharge status: Home / Self Care | Payer: 59 | Source: Ambulatory Visit | Attending: Thoracic Surgery (Cardiothoracic Vascular Surgery) | Admitting: Thoracic Surgery (Cardiothoracic Vascular Surgery)

## 2023-04-27 ENCOUNTER — Ambulatory Visit (INDEPENDENT_AMBULATORY_CARE_PROVIDER_SITE_OTHER): Payer: 59

## 2023-04-27 ENCOUNTER — Ambulatory Visit: Payer: Self-pay | Admitting: Thoracic Surgery (Cardiothoracic Vascular Surgery)

## 2023-04-27 VITALS — BP 106/70 | HR 90 | Ht 71.0 in | Wt 188.4 lb

## 2023-04-27 VITALS — BP 111/62 | HR 96 | Resp 18 | Wt 186.2 lb

## 2023-04-27 DIAGNOSIS — Z7901 Long term (current) use of anticoagulants: Secondary | ICD-10-CM | POA: Diagnosis not present

## 2023-04-27 DIAGNOSIS — I3139 Other pericardial effusion (noninflammatory): Secondary | ICD-10-CM

## 2023-04-27 DIAGNOSIS — Z952 Presence of prosthetic heart valve: Secondary | ICD-10-CM

## 2023-04-27 DIAGNOSIS — R600 Localized edema: Secondary | ICD-10-CM | POA: Diagnosis not present

## 2023-04-27 DIAGNOSIS — Z905 Acquired absence of kidney: Secondary | ICD-10-CM

## 2023-04-27 DIAGNOSIS — J90 Pleural effusion, not elsewhere classified: Secondary | ICD-10-CM

## 2023-04-27 DIAGNOSIS — Z79899 Other long term (current) drug therapy: Secondary | ICD-10-CM

## 2023-04-27 LAB — POCT INR: INR: 1.5 — AB (ref 2.0–3.0)

## 2023-04-27 MED ORDER — FUROSCIX 80 MG/10ML ~~LOC~~ CTKT: 80.0000 mg | CARTRIDGE | Freq: Once | SUBCUTANEOUS | 0 refills | Status: DC

## 2023-04-27 MED ORDER — WARFARIN SODIUM 4 MG PO TABS: ORAL_TABLET | ORAL | 1 refills | Status: DC

## 2023-04-27 MED ORDER — TORSEMIDE 20 MG PO TABS
ORAL_TABLET | ORAL | 3 refills | Status: DC
Start: 2023-04-27 — End: 2023-05-08

## 2023-04-27 NOTE — Patient Instructions (Signed)
 Medication Instructions:  START TORSEMIDE 40 MG IN THE MORNING AND 20 MG AT NIGHT *If you need a refill on your cardiac medications before your next appointment, please call your pharmacy*   Lab Work: BNP, CMP, AND CBC TODAY If you have labs (blood work) drawn today and your tests are completely normal, you will receive your results only by: MyChart Message (if you have MyChart) OR A paper copy in the mail If you have any lab test that is abnormal or we need to change your treatment, we will call you to review the results.   Testing/Procedures: NO TESTING   Follow-Up: At [facility], you and your health needs are our priority.  As part of our continuing mission to provide you with exceptional heart care, we have created designated Provider Care Teams.  These Care Teams include your primary Cardiologist (physician) and Advanced Practice Providers (APPs -  Physician Assistants and Nurse Practitioners) who all work together to provide you with the care you need, when you need it.  Your next appointment:   1-2 week(s)  Provider:   Azalee Course, PA    Other Instructions

## 2023-04-27 NOTE — Telephone Encounter (Signed)
 Per Azalee Course, administer Furoscix SQ in room and dispense one additional sample for patient at home.  Administered in room and educated patient

## 2023-04-27 NOTE — Progress Notes (Signed)
 [address]       [place] [address]             [phone]     HPI: Mr. Manolis returns for follow-up after recent aortic and mitral valve replacement.  Thomas [name] is a 63 year old man with a history of hypertension, reflux, anxiety, alcohol dependence, left nephrectomy, right ulnar shortening osteotomy, hardware removal, nonunion repair, and aortic and mitral valve endocarditis.  He was admitted in early January with shortness of breath.  Noted to have new heart murmurs.  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  Blood cultures grew Streptococcus.  He was treated with intravenous antibiotics.  He underwent mechanical AVR and MVR on 03/16/2023.  His postoperative course was relatively uncomplicated.  He had a junctional rhythm so he was not treated with a beta-blocker.  He did have a thoracentesis for moderate left pleural effusion.  He was started on Coumadin and his INR was 2.9 at the time of discharge.  He completed his course of penicillin G on February 14.  He saw Gershon Crane in the office on 04/10/2023.  He had an echocardiogram which showed a pericardial effusion.  Also had a small left pleural effusion.  He was given a prednisone taper and started on diuretics.  He saw cardiology yesterday repeat echo showed the pericardial effusion was unchanged.  His primary complaint is swelling in his legs.  He does not have any chest pain.  Denies shortness of breath.  Patient Active Problem List   Diagnosis Date Noted   PICC (peripherally inserted central catheter) in place 04/09/2023   Medication monitoring encounter 04/09/2023   Long term (current) use of anticoagulants 04/05/2023   S/P AVR (aortic valve replacement) 03/16/2023   S/P MVR (mitral valve replacement) 03/16/2023   Encephalopathy acute 03/16/2023   Bacterial endocarditis 03/16/2023   Acute on chronic diastolic CHF (congestive heart failure) (HCC) 03/11/2023   Acute bacterial  endocarditis 03/10/2023   Acute heart failure with preserved ejection fraction (HFpEF) (HCC) 03/10/2023   Acute CHF (HCC) 03/08/2023   Pancytopenia (HCC) 03/08/2023   Alcohol abuse 03/08/2023   Essential hypertension 03/08/2023   Closed fracture of shaft of right ulna with delayed healing 02/27/2023   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     ALPRAZolam (XANAX) 1 MG tablet Take 1 mg by mouth at bedtime as needed for anxiety.     amLODipine (NORVASC) 10 MG tablet TAKE 1 TABLET BY MOUTH EVERY DAY 90 tablet 1   atorvastatin (LIPITOR) 40 MG tablet TAKE 1 TABLET BY MOUTH EVERY DAY 90 tablet 1   magnesium oxide (MAG-OX) 400 (240 Mg) MG tablet Take 1 tablet (400 mg total) by mouth 2 (two) times daily. 20 tablet 0   olmesartan (BENICAR) 40 MG tablet Take 40 mg by mouth daily.     omeprazole (PRILOSEC OTC) 20 MG tablet Take 20 mg by mouth daily.     potassium chloride SA (KLOR-CON M) 20 MEQ tablet Take 1 tablet (20 mEq total) by mouth 2 (two) times daily. 14 tablet 0   predniSONE (STERAPRED UNI-PAK 21 TAB) 10 MG (21) TBPK tablet On day 1 (  today) take 6 tablets by mouth.  On day 2 take 5 tablets by mouth. On day 3 take 4 tablets by mouth. On day 4 take 3 tablets by mouth. On day 5 take 2 tablets by mouth. On day 6 take 1 tablet by mouth then stop. 21 tablet 0   torsemide (DEMADEX) 20 MG tablet Take 40 mg in the morning and 20 mg at night 90 tablet 3   warfarin (COUMADIN) 4 MG tablet Take 1 tablet (4 mg total) by mouth daily at 4 PM. As directed by coumadin clinic 100 tablet 1   furosemide (LASIX) 40 MG tablet TAKE 1 TABLET BY MOUTH TWICE A DAY 14 tablet 0   furosemide (LASIX) 40 MG tablet Take  1-2 tablets (40-80 mg total) by mouth daily. Take 2 tabs am and 1 tab pm x 3 days, then 1 tab bid or as directed. 70 tablet 11   No current facility-administered medications for this visit.    Physical Exam BP 111/62 (BP Location: Left Arm, Patient Position: Sitting, Cuff Size: Normal)   Pulse 96   Resp 18   Wt 186 lb 3.2 oz (84.5 kg)   SpO2 94% Comment: RA  BMI 25.12 kg/m  64 year old man in no acute distress Alert and oriented x 3 with no focal deficits Lungs clear with equal breath sounds bilaterally Cardiac regular rate and rhythm with good valve clicks No rub Sternal incision clean dry and intact and well-healed, sternum stable 3+ edema both lower extremities  Diagnostic Tests: CHEST - 2 VIEW   COMPARISON:  Prior chest x-ray 04/10/2023   FINDINGS: Patient is status post median sternotomy with evidence of prior aortic valve replacement and mitral valve replacement. Small bilateral pleural effusions, improved compared to prior. Associated bibasilar atelectasis. No pneumothorax. No pulmonary edema. Cardiac and mediastinal contours are unchanged.   IMPRESSION: 1. Small bilateral pleural effusions, improved compared to prior. 2. Bibasilar atelectasis. 3. No pneumothorax. 4. Surgical changes of prior aortic and mitral valve repair.     Electronically Signed   By: Malachy Moan M.D.   On: 04/27/2023 12:57 Echocardiogram 04/26/2023 IMPRESSIONS     1. Left ventricular ejection fraction, by estimation, is 65 to 70%. The  left ventricle has normal function. The left ventricle has no regional  wall motion abnormalities. Left ventricular diastolic parameters are  indeterminate.   2. Right ventricular systolic function is normal. The right ventricular  size is normal. There is normal pulmonary artery systolic pressure.   3. Left atrial size was severely dilated.   4. Right atrial size was mildly dilated.   5. The mitral valve has been repaired/replaced. Trivial mitral  valve  regurgitation. No evidence of mitral stenosis. The mean mitral valve  gradient is 7.0 mmHg with average heart rate of 81 bpm. There is a 31/33  ON-X Prosthetic mechanical valve present  in the mitral position. Procedure Date: 03/16/23.   6. Pericardial effusion measuring up to 2.25 cm. Unchanged in size from  04/09/23. There is no evidence of cardiac tamponade.   7. The aortic valve has been repaired/replaced. Aortic valve  regurgitation is not visualized. No aortic stenosis is present. There is a  21 mm ON-X Prosthetic mechanical valve present in the aortic position.  Procedure Date: 03/16/23. Aortic valve area, by   VTI measures 1.83 cm. Aortic valve mean gradient measures 21.0 mmHg.  Aortic valve Vmax measures 2.88 m/s.   8. The inferior vena cava is dilated in size with >50% respiratory  variability, suggesting right atrial pressure of 8 mmHg.   I reviewed the chest x-ray images and the echocardiogram images.  Chest x-ray shows very small bilateral effusions.  Overall normal postoperative appearance.  Echo shows preserved left and right ventricular systolic function.  There is a relatively large pericardial effusion.  No tamponade.  Impression: Thomas [name] is a 63 year old man with a history of hypertension, reflux, anxiety, alcohol dependence, left nephrectomy, right ulnar shortening osteotomy, hardware removal, nonunion repair, and aortic and mitral valve endocarditis.  Admitted with aortic and mitral valve endocarditis.  He underwent mechanical AVR and MVR on 03/15/2024.  Doing well overall with no pain and good cardiac function.  However, he does have a large pericardial effusion.  There is no overt tamponade.  However he does have 3+ pitting edema in both lower extremities has been refractory to diuretics.  We tried a steroid taper but that did not really decreased the fusion.  Discussed with cardiology earlier today.  I think he would benefit from pericardiocentesis to drain  that effusion.  Dr. Earmon Phoenix team will arrange for that.  From my standpoint he is doing well.  He may drive.  Advised him not to lift anything over 10 pounds for at least another week.  After that he can begin gradually increasing his activities as tolerated.  His INR today was only 1.5.  He has already received a call from the Coumadin clinic to adjust his medications.  Strep endocarditis-completed 6 weeks of antibiotics.  Now 2 weeks off antibiotics with no issues.  Plan: Follow-up with Dr. Excell Seltzer regarding pericardial effusion Adjust Coumadin dosage as instructed.  Follow-up with Coumadin clinic. I will be happy to see him back anytime in future frame be of any further assistance with his care.  [name], MD [facility] [phone]

## 2023-04-27 NOTE — Progress Notes (Signed)
 Cardiology Office Note:  .   Date:  04/27/2023  ID:  Thomas [name], DOB 1961-01-07, MRN [id number] PCP: Assunta Found, MD   [facility] Providers Cardiologist:  Tonny Bollman, MD     History of Present Illness: .   Thomas [name] is a 63 y.o. male with PMH of HTN, left nephrectomy at age 48 (due to kidney stones?), remote substance abuse (cocaine, snort, never injected, last use 2010), anxiety, GERD and thrombocytopenia. He underwent R ulnar shortening osteotomy in August 2024. He later developed hardware failure and osteolysis requiring plate removal and bone biopsy in Dec 2024. Culture was negative. 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 Since his procedure, he has developed intermittent fever. He presented to the hospital on 03/07/2022 with CHF. TTE on 03/09/2023 showed EF > 75%, severe AI with linear shaped echodensity measuring 2 cm x 0.35 cm concerning for endocarditis. 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 EF was 60 to 65%. Blood culture came back positive for Streptococcus infantarius (formerly known as strep bovis). He was treated empiric abx. He was seen by Dr. Dorris Fetch for consideration of valve replacement surgery. Pre-surgical cath on 03/13/2023 showed 60% prox to mid LAD lesion, 60% stenosis in side branch of D1, 30% mid to distal LAD, 50% distal LAD. Doppler obtained on 1/15 showed 1-39% bilateral ICA disease. He underwent 21 mm Onyx Mechanical AVR and 31-29mm Onyx Mechanical MVR on 03/16/2023 by Dr. Dorris Fetch. Post procedure, telemetry showed nonspecific intraventricular conduction delay followed by accelerated junctional rhythm. Beta blocker was not started. He was seen by Dr. Nelly Laurence who recommended continue monitoring. CHB resolved, EP decided to hold off PPM implant. He did have postop afib. He remainted on  PCN G for strep species. He had left thoracentesis with removal of 1200 cc fluid on 1/24. 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  Since discharged, he underwent repeat L thoracentesis with removal of 1.5 L on 04/06/2023. Repeat echo 04/09/2023 showed stable aortic and mitral valve replacement, EF 65-70%, no RWMA, RVSP 38 mmHg, severe biatrial enlargement, large pericardial effusion and moderate left pleural effusion.   I saw the patient on 04/13/2023 at which time he continued to have at least 3+ pitting edema in bilateral lower extremity, he was on 40 mg twice daily of Lasix.  I asked him to increase Lasix to 80 mg a.m. and 40 mg p.m. for 3 days before going back to 40 mg twice daily dosing.  Repeat echocardiogram on 04/26/2023 showed EF 65 to 70%, no regional wall motion abnormality, normal RV, severe left atrial enlargement, trivial MR with mechanical mitral valve replacement, mechanical aortic valve present, pericardial effusion measuring up to 2.25 cm unchanged in size when compared to the previous echo from 04/09/2023.  There was no evidence of cardiac tamponade.  Due to persistent swelling, I kept him on 80 mg a.m. and 40 mg p.m. of the Lasix.   Patient presents today for follow-up.  He continues to have 3+ pitting edema on physical exam.  He is lung is clear.  I reviewed the recent echocardiogram with him from yesterday.  I discussed his case with both DOD Dr. Rennis Golden and Dr. Excell Seltzer his primary cardiologist.  Given persistent lower extremity edema, I will give him 2 subcutaneous Lasix autoinjector to use this afternoon and  tomorrow morning.  After which, we will switch him to torsemide 40 mg a.m. and 20 mg p.m. for better diuresis.  He will need a CMP, CBC, and BNP today.  He is due for Coumadin check today.  We are concerned about the persistently large pericardial effusion, even though there is no tamponade and then no urgency to  proceed with pericardiocentesis, I am not confident that with diuresis there will be significant reduction in the size of the pericardial effusion.  He is on Coumadin due to mechanical valve which further complicate possibility of pericardiocentesis.  We suspect the patient may eventually require pericardiocentesis if the pericardial effusion does not decrease.  He is scheduled to see Dr. Dorris Fetch this afternoon.  I have forwarded staff message to Dr. Excell Seltzer and Dr. Dorris Fetch to see what is the plan for this patient's pericardial effusion.  I plan to see the patient back within 2 weeks for early follow-up.   Addendum: Dr. Dorris Fetch has recommended to proceed with pericardiocentesis.  I have discussed the case with Dr. Excell Seltzer who plan to do the procedure.  I reached out to our clinical pharmacist to arrange Lovenox bridging.  I called the patient and his wife, risk and the benefit of the procedure has been discussed with the patient including bleeding, heart attack, stroke, acute respiratory failure, loss of life, cardiac perforation during the procedure.  He is aware of the potential risk and the benefit, he is willing to proceed.  Procedure scheduled for 3/12.  ROS:   Patient denies any significant shortness of breath, however continued to have lower extremity edema.  Studies Reviewed: .        Cardiac Studies & Procedures   ______________________________________________________________________________________________ CARDIAC CATHETERIZATION  CARDIAC CATHETERIZATION 03/13/2023  Narrative Images from the original result were not included.    Moderate Single-Vessel Disease: Prox LAD to Mid LAD lesion is 60% stenosed with 60% stenosed side branch in 1st Diag. Mid LAD to Dist LAD lesion is 30% stenosed.  Dist LAD lesion is 50% stenosed.  Dominance: Right   The patient was hypotensive with central aortic pressures in the 70s to 80s during the procedure.  This improved with 500 mL bolus.   Post procedural radial arterial pressures were in the 110-115/48-50 mmHg.  This differs by at least 30 to 35 mmHg from the femoral arterial blood pressure cuff.   RECOMMENDATIONS Patient will return to the nursing floor for ongoing care with CVTS consultation. LAD lesions are not likely flow-limiting and were not causing symptoms prior to this hospitalization.  Based on the extent of operation, would consider medical therapy with PCI at a later date if indicated by symptoms/stress test. Restart aspirin 81 mg daily  Bryan Lemma, MD  Findings Coronary Findings Diagnostic  Dominance: Right  Left Main Vessel was injected. Vessel is large.  Left Anterior Descending Vessel is large. Prox LAD to Mid LAD lesion is 60% stenosed with 60% stenosed side branch in 1st Diag. Mid LAD to Dist LAD lesion is 30% stenosed. Dist LAD lesion is 50% stenosed.  Second Diagonal Branch Vessel is moderate in size.  Left Circumflex Vessel is large. The vessel courses distally is a large lateral OM branch.  First Obtuse Marginal Branch Vessel is small in size.  Second Obtuse Marginal Branch Vessel is moderate in size.  Lateral Second Obtuse Marginal Branch Vessel is small in size.  Fourth Obtuse Marginal Branch Vessel is small in size.  Left Posterior Atrioventricular Artery Vessel is small in size.  Right Coronary Artery Vessel was injected. Vessel is large. The vessel exhibits minimal luminal irregularities.  Right Ventricular Branch Vessel is small in size.  Inferior Septal Vessel is small in size.  First Right Posterolateral Branch Vessel is small in size.  Second Right Posterolateral Branch Vessel is small in size.  Intervention  No interventions have been documented.     ECHOCARDIOGRAM  ECHOCARDIOGRAM COMPLETE 04/26/2023  Narrative ECHOCARDIOGRAM REPORT    Patient Name:   KEIDEN DESKIN [facility] Date of Exam: 04/26/2023 Medical Rec #:  [id number]         Height:        71.0 in Accession #:    [id number]        Weight:       193.0 lb Date of Birth:  [birth year]         BSA:          2.077 m Patient Age:    62 years          BP:           136/72 mmHg Patient Gender: M                 HR:           81 bpm. Exam Location:  [address]  Procedure: 2D Echo, 3D Echo, Cardiac Doppler and Color Doppler (Both Spectral and Color Flow Doppler were utilized during procedure).  Indications:    Pericardial effusion I31.3  History:        Patient has prior history of Echocardiogram examinations, most recent 04/09/2023. CHF; Risk Factors:Hypertension and ETOH. Pericardial Effusion. Aortic Valve: 21 mm ON-X Prosthetic mechanical valve is present in the aortic position. Procedure Date: 03/16/23. Mitral Valve: 31/33 ON-X Prosthetic mechanical valve valve is present in the mitral position. Procedure Date: 03/16/23.  Sonographer:    Eulah Pont RDCS Referring Phys: [id number] STEVEN C HENDRICKSON  IMPRESSIONS   1. Left ventricular ejection fraction, by estimation, is 65 to 70%. The left ventricle has normal function. The left ventricle has no regional wall motion abnormalities. Left ventricular diastolic parameters are indeterminate. 2. Right ventricular systolic function is normal. The right ventricular size is normal. There is normal pulmonary artery systolic pressure. 3. Left atrial size was severely dilated. 4. Right atrial size was mildly dilated. 5. The mitral valve has been repaired/replaced. Trivial mitral valve regurgitation. No evidence of mitral stenosis. The mean mitral valve gradient is 7.0 mmHg with average heart rate of 81 bpm. There is a 31/33 ON-X Prosthetic mechanical valve present in the mitral position. Procedure Date: 03/16/23. 6. Pericardial effusion measuring up to 2.25 cm. Unchanged in size from 04/09/23. There is no evidence of cardiac tamponade. 7. The aortic valve has been repaired/replaced. Aortic valve regurgitation is not visualized. No aortic  stenosis is present. There is a 21 mm ON-X Prosthetic mechanical valve present in the aortic position. Procedure Date: 03/16/23. Aortic valve area, by VTI measures 1.83 cm. Aortic valve mean gradient measures 21.0 mmHg. Aortic valve Vmax measures 2.88 m/s. 8. The inferior vena cava is dilated in size with >50% respiratory variability, suggesting right atrial pressure of 8 mmHg.  FINDINGS Left Ventricle: Left ventricular ejection fraction, by estimation, is 65 to 70%. The left ventricle has normal function. The left ventricle has no regional wall motion abnormalities. Strain imaging was not performed. 3D ejection fraction reviewed and evaluated as part of the interpretation. Alternate measurement of EF is felt to be most reflective of LV function. The left  ventricular internal cavity size was normal in size. There is no left ventricular hypertrophy. Left ventricular diastolic parameters are indeterminate.  Right Ventricle: The right ventricular size is normal. No increase in right ventricular wall thickness. Right ventricular systolic function is normal. There is normal pulmonary artery systolic pressure. The tricuspid regurgitant velocity is 2.54 m/s, and with an assumed right atrial pressure of 8 mmHg, the estimated right ventricular systolic pressure is 33.8 mmHg.  Left Atrium: Left atrial size was severely dilated.  Right Atrium: Right atrial size was mildly dilated.  Pericardium: Pericardial effusion measuring up to 2.25 cm. Unchanged in size from 04/09/23. There is no evidence of pericardial effusion. The pericardial effusion is circumferential. There is no evidence of cardiac tamponade.  Mitral Valve: The mitral valve has been repaired/replaced. Trivial mitral valve regurgitation. There is a 31/33 ON-X Prosthetic mechanical valve present in the mitral position. Procedure Date: 03/16/23. No evidence of mitral valve stenosis. MV peak gradient, 16.0 mmHg. The mean mitral valve gradient is 7.0  mmHg with average heart rate of 81 bpm.  Tricuspid Valve: The tricuspid valve is normal in structure. Tricuspid valve regurgitation is mild . No evidence of tricuspid stenosis.  Aortic Valve: The aortic valve has been repaired/replaced. Aortic valve regurgitation is not visualized. No aortic stenosis is present. Aortic valve mean gradient measures 21.0 mmHg. Aortic valve peak gradient measures 33.2 mmHg. Aortic valve area, by VTI measures 1.83 cm. There is a 21 mm ON-X Prosthetic mechanical valve present in the aortic position. Procedure Date: 03/16/23.  Pulmonic Valve: The pulmonic valve was normal in structure. Pulmonic valve regurgitation is trivial. No evidence of pulmonic stenosis.  Aorta: The aortic root is normal in size and structure.  Venous: The inferior vena cava is dilated in size with greater than 50% respiratory variability, suggesting right atrial pressure of 8 mmHg.  IAS/Shunts: No atrial level shunt detected by color flow Doppler.  Additional Comments: 3D was performed not requiring image post processing on an independent workstation and was normal.   LEFT VENTRICLE PLAX 2D LVIDd:         4.39 cm LVIDs:         2.56 cm LV PW:         1.05 cm LV IVS:        0.91 cm LVOT diam:     2.13 cm   3D Volume EF: LV SV:         107       3D EF:        55 % LV SV Index:   52        LV EDV:       178 ml LVOT Area:     3.56 cm  LV ESV:       80 ml LV SV:        99 ml  RIGHT VENTRICLE RV S prime:     10.90 cm/s TAPSE (M-mode): 1.8 cm  LEFT ATRIUM              Index        RIGHT ATRIUM           Index LA diam:        5.95 cm  2.86 cm/m   RA Area:     20.50 cm LA Vol (A2C):   97.1 ml  46.75 ml/m  RA Volume:   68.20 ml  32.84 ml/m LA Vol (A4C):   102.0 ml 49.11 ml/m LA Biplane Vol: 106.0  ml 51.04 ml/m AORTIC VALVE AV Area (Vmax):    1.99 cm AV Area (Vmean):   1.89 cm AV Area (VTI):     1.83 cm AV Vmax:           288.00 cm/s AV Vmean:          217.333 cm/s AV VTI:             0.585 m AV Peak Grad:      33.2 mmHg AV Mean Grad:      21.0 mmHg LVOT Vmax:         161.00 cm/s LVOT Vmean:        115.000 cm/s LVOT VTI:          0.301 m LVOT/AV VTI ratio: 0.51  AORTA Ao Root diam: 3.40 cm Ao Asc diam:  3.11 cm  MITRAL VALVE                TRICUSPID VALVE MV Area (PHT): 3.60 cm     TR Peak grad:   25.8 mmHg MV Area VTI:   2.46 cm     TR Vmax:        254.00 cm/s MV Peak grad:  16.0 mmHg MV Mean grad:  7.0 mmHg     SHUNTS MV Vmax:       2.00 m/s     Systemic VTI:  0.30 m MV Vmean:      117.0 cm/s   Systemic Diam: 2.13 cm MV Decel Time: 211 msec MV E velocity: 180.00 cm/s MV A velocity: 80.50 cm/s MV E/A ratio:  2.24  Chilton Si MD Electronically signed by Chilton Si MD Signature Date/Time: 04/26/2023/6:25:39 PM    Final   TEE  ECHO INTRAOPERATIVE TEE 03/16/2023  Narrative *INTRAOPERATIVE TRANSESOPHAGEAL REPORT *    Patient Name:   [facility] Date of Exam: 03/16/2023 Medical Rec #:  [id number]         Height:       71.0 in Accession #:    [id number]        Weight:       168.7 lb Date of Birth:  October 31, 1960         BSA:          1.96 m Patient Age:    62 years          BP: Patient Gender: M                 HR: Exam Location:  Anesthesiology  Transesophogeal exam was perform intraoperatively during surgical procedure. Patient was closely monitored under general anesthesia during the entirety of examination.  Indications:     MVR Clement Husbands Performing Phys: Anice Paganini, DO  Complications: No known complications during this procedure. POST-OP IMPRESSIONS _ Left Ventricle: The left ventriclar function remains normal on inotropic support. _ Right Ventricle: The right ventriclar function is normal on inotropic support. _ Aorta: The aorta appears unchanged from pre-bypass. There is no dissection _ Aortic Valve: There is a mechanical valve in the aortic position the valve is well seated with no paravalvular leak. Trace  intravalvular regurgiation jets visualized consistent with normal washing jets. Mean PG . Vmax 2.8 m/s _ Mitral Valve: There is a mechanical valve in the mitral position. The valve is well seated with no paravalvular leak. Trace regurgitation jets visualized consistent with normal washing jets. _ Tricuspid Valve: The tricuspid valve appears unchanged from pre-bypass. There is mild TR. _ Pulmonic Valve: The pulmonic valve appears unchanged  from pre-bypass. _ Interatrial Septum: The interatrial septum appears intact with no shunt following surgical closure. _ Pericardium: There is no pericardial effusion.  PRE-OP FINDINGS Left Ventricle: The left ventricle has normal systolic function, with an ejection fraction of 61.5% by Simposon's biplane method of discs. The cavity size was normal.   Right Ventricle: The right ventricle has normal systolic function. The cavity was normal. There is no increase in right ventricular wall thickness. TAPSE 2.34cm  Left Atrium: No left atrial/left atrial appendage thrombus was detected.  Right Atrium: Right atrial pressure is estimated at 20 mmHg.  Interatrial Septum: No atrial level shunt detected by color flow Doppler. There is no evidence of a patent foramen ovale.  Pericardium: There is no evidence of pericardial effusion. There is a moderate pleural effusion in both left and right lateral regions.  Mitral Valve: The mitral valve is normal in structure. There are multiple vegetations noted on the mitral valve leaflets, resulting in severe mitral regurgitation.  Tricuspid Valve: The tricuspid valve was normal in structure. Tricuspid valve regurgitation is trivial by color flow Doppler. There is no evidence of valve vegetations / endocarditis.  Aortic Valve: The aortic valve is trileaflet. The leaflets open well. There is thickening of the leaflets with with vegetation noted. Findings consistent with preop diagnosis of endocarditis in the setting of  known bactermia. There is severe aortic insufficiency. No aortic stenosis   Pulmonic Valve: The pulmonic valve was normal in structure. Pulmonic valve regurgitation is trivial by color flow Doppler.   Aorta: The aortic root, ascending aorta, and aortic arch are normal in size and structure. There is no dissection.  +--------------+--------++ LEFT VENTRICLE               +----------------+---------++ +--------------+--------++       Diastology                PLAX 2D                      +----------------+---------++ +--------------+--------++       LV e' lateral:  7.10 cm/s LVOT diam:    2.00 cm        +----------------+---------++ +--------------+--------++       LV E/e' lateral:25.6      LVOT Area:    3.14 cm       +----------------+---------++ +--------------+--------++                        +--------------+--------++  +------------------+---------++ LV Volumes (MOD)            +------------------+---------++ LV area d, A2C:   51.00 cm +------------------+---------++ LV area d, A4C:   57.30 cm +------------------+---------++ LV area s, A2C:   27.80 cm +------------------+---------++ LV area s, A4C:   32.80 cm +------------------+---------++ LV major d, A2C:  10.50 cm  +------------------+---------++ LV major d, A4C:  10.90 cm  +------------------+---------++ LV major s, A2C:  8.43 cm   +------------------+---------++ LV major s, A4C:  9.07 cm   +------------------+---------++ LV vol d, MOD A2C:206.0 ml  +------------------+---------++ LV vol d, MOD A4C:246.0 ml  +------------------+---------++ LV vol s, MOD A2C:75.3 ml   +------------------+---------++ LV vol s, MOD A4C:96.9 ml   +------------------+---------++ LV SV MOD A2C:    130.7 ml  +------------------+---------++ LV SV MOD A4C:    246.0 ml  +------------------+---------++ LV SV MOD BP:     140.7 ml   +------------------+---------++  +---------------+------+-------+ RIGHT VENTRICLE              +---------------+------+-------+  RV FAC:        28.0 %        +---------------+------+-------+ TAPSE (M-mode):2.3 cm2.37 cm +---------------+------+-------+  +------------------+------------++  +--------------+-----------++ AORTIC VALVE                    PULMONIC VALVE            +------------------+------------++  +--------------+-----------++ AV Area (Vmax):   2.12 cm      PV Vmax:      0.57 m/s    +------------------+------------++  +--------------+-----------++ AV Area (Vmean):  1.97 cm      PV Vmean:     37.600 cm/s +------------------+------------++  +--------------+-----------++ AV Area (VTI):    1.83 cm      PV VTI:       0.122 m     +------------------+------------++  +--------------+-----------++ AV Vmax:          257.50 cm/s   PV Peak grad: 1.3 mmHg    +------------------+------------++  +--------------+-----------++ AV Vmean:         175.500 cm/s  PV Mean grad: 1.0 mmHg    +------------------+------------++  +--------------+-----------++ AV VTI:           0.567 m      +------------------+------------++ AV Peak Grad:     26.5 mmHg    +------------------+------------++ AV Mean Grad:     15.0 mmHg    +------------------+------------++ LVOT Vmax:        174.00 cm/s  +------------------+------------++ LVOT Vmean:       110.000 cm/s +------------------+------------++ LVOT VTI:         0.331 m      +------------------+------------++ LVOT/AV VTI ratio:0.58         +------------------+------------++ AR PHT:           230 msec     +------------------+------------++  +--------------+-------++ AORTA                 +--------------+-------++ Ao Sinus diam:3.20 cm +--------------+-------++ Ao STJ diam:  2.7 cm  +--------------+-------++  +--------------+----------++   +---------------+-----------++ MITRAL VALVE              TRICUSPID VALVE            +--------------+----------++  +---------------+-----------++ MV Area (PHT):4.15 cm    TR Peak grad:  18.0 mmHg   +--------------+----------++  +---------------+-----------++ MV Peak grad: 13.2 mmHg   TR Vmax:       212.00 cm/s +--------------+----------++  +---------------+-----------++ MV Mean grad: 5.0 mmHg   +--------------+----------++  +--------------+-------+ MV Vmax:      1.82 m/s    SHUNTS                +--------------+----------++  +--------------+-------+ MV Vmean:     91.3 cm/s   Systemic VTI: 0.33 m  +--------------+----------++  +--------------+-------+ MV VTI:       0.36 m      Systemic Diam:2.00 cm +--------------+----------++  +--------------+-------+ MV PHT:       53.07 msec +--------------+----------++ MV Decel Time:183 msec   +--------------+----------++ +--------------+-----------++ MV E velocity:182.00 cm/s +--------------+-----------++ MV A velocity:46.70 cm/s  +--------------+-----------++ MV E/A ratio: 3.90        +--------------+-----------++   Anice Paganini Electronically signed by Anice Paganini Signature Date/Time: 03/16/2023/2:18:30 PM    Final        ______________________________________________________________________________________________      Risk Assessment/Calculations:             Physical Exam:   VS:  BP 106/70 (BP Location: Left Arm, Patient Position:  Sitting)   Pulse 90   Ht 5\' 11"  (1.803 m)   Wt 188 lb 6.4 oz (85.5 kg)   SpO2 96%   BMI 26.28 kg/m    Wt Readings from Last 3 Encounters:  04/27/23 186 lb 3.2 oz (84.5 kg)  04/27/23 188 lb 6.4 oz (85.5 kg)  04/13/23 193 lb (87.5 kg)    GEN: Well nourished, well developed in no acute distress NECK: No JVD; No carotid bruits CARDIAC: RRR, no murmurs, rubs, gallops RESPIRATORY:  Clear to auscultation without rales, wheezing or  rhonchi  ABDOMEN: Soft, non-tender, non-distended EXTREMITIES:  No edema; No deformity   ASSESSMENT AND PLAN: .    Large pericardial effusion -No significant change despite diuresis. -Case discussed with Dr. Dorris Fetch and Dr. Excell Seltzer, recommend proceed with pericardiocentesis. -Patient will require Lovenox bridging prior to pericardiocentesis. -After the procedure, patient likely will be kept in [facility] ICU overnight until the drain can be pulled.  Lower Extremity Edema Persistent despite increased Lasix dosage. No evidence of fluid in lungs. -Administer two subcutaneous Lasix autoinjectors this afternoon and tomorrow morning. -Switch to Torsemide 40mg  AM and 20mg  PM after completion of Lasix autoinjectors. -Obtain CMP, CBC, and BNP today. -Return for follow-up in two weeks.  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 No evidence of tamponade. EF 65-70%. Severe left atrial enlargement. No regional wall motion abnormality. Normal RV. Mechanical mitral and aortic valves present. -On Coumadin -Discussed with our clinical pharmacist, will need Lovenox bridging prior to pericardiocentesis.  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       Informed Consent   Shared Decision Making/Informed Consent The risks [bleeding, infection, cardiac arrest, damage to the heart or blood vessels requiring surgery, pneumothorax requiring chest tube placement, arrhythmias, changes in blood pressure, organ injury, and rarely death], benefits [therapeutic relief of fluid collection around the heart, diagnostic support], and alternatives of a pericardiocentesis were discussed in detail with Mr. Rounsaville and he agrees to proceed.     Dispo: Follow-up in 2 to 3 weeks  Signed, Azalee Course, [place]

## 2023-04-27 NOTE — Patient Instructions (Signed)
 Take 1.5 tablets today only then INCREASE TO 1.5 tablets daily, except 1 tablet on Mondays, Wednesdays and Fridays. INR in 1 week [phone]

## 2023-04-28 LAB — COMPREHENSIVE METABOLIC PANEL
ALT: 49 IU/L — ABNORMAL HIGH (ref 0–44)
AST: 67 IU/L — ABNORMAL HIGH (ref 0–40)
Albumin: 3.1 g/dL — ABNORMAL LOW (ref 3.9–4.9)
Alkaline Phosphatase: 112 IU/L (ref 44–121)
BUN/Creatinine Ratio: 17 (ref 10–24)
BUN: 12 mg/dL (ref 8–27)
Bilirubin Total: 0.5 mg/dL (ref 0.0–1.2)
CO2: 23 mmol/L (ref 20–29)
Calcium: 8.4 mg/dL — ABNORMAL LOW (ref 8.6–10.2)
Chloride: 105 mmol/L (ref 96–106)
Creatinine, Ser: 0.7 mg/dL — ABNORMAL LOW (ref 0.76–1.27)
Globulin, Total: 3.4 g/dL (ref 1.5–4.5)
Glucose: 85 mg/dL (ref 70–99)
Potassium: 3.5 mmol/L (ref 3.5–5.2)
Sodium: 140 mmol/L (ref 134–144)
Total Protein: 6.5 g/dL (ref 6.0–8.5)
eGFR: 104 mL/min/{1.73_m2} (ref >59)

## 2023-04-28 LAB — CBC
Hematocrit: 24.1 % — ABNORMAL LOW (ref 37.5–51.0)
Hemoglobin: 7.3 g/dL — ABNORMAL LOW (ref 13.0–17.7)
MCH: 24.3 pg — ABNORMAL LOW (ref 26.6–33.0)
MCHC: 30.3 g/dL — ABNORMAL LOW (ref 31.5–35.7)
MCV: 80 fL (ref 79–97)
Platelets: 200 10*3/uL (ref 150–450)
RBC: 3.01 x10E6/uL — ABNORMAL LOW (ref 4.14–5.80)
RDW: 15.4 % (ref 11.6–15.4)
WBC: 3.5 10*3/uL (ref 3.4–10.8)

## 2023-04-28 LAB — BRAIN NATRIURETIC PEPTIDE: BNP: 230.6 pg/mL — ABNORMAL HIGH (ref 0.0–100.0)

## 2023-04-28 NOTE — Progress Notes (Unsigned)
   Thomas [name] [facility] - 63 y.o. male MRN [id number]  Date of birth: Apr 25, 1960  Office Visit Note: Visit Date: 04/30/2023 PCP: Assunta Found, MD Referred by: Assunta Found, MD  Subjective:  HPI: Thomas [name] is a 63 y.o. 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  Pertinent ROS were reviewed with the patient and found to be negative unless otherwise specified above in HPI.   Assessment & Plan: Visit Diagnoses:  No diagnosis found.   Plan: X-rays obtained today show ***  Follow-up: No follow-ups on file.   Meds & Orders: No orders of the defined types were placed in this encounter.   No orders of the defined types were placed in this encounter.    Procedures: No procedures performed       Objective:   Vital Signs: There were no vitals taken for this visit.  Ortho Exam Right upper extremity: - Digital range of motion is preserved, hand remains warm well-perfused, sensation intact in all distributions - Well-healed ulnar incision over the forearm  Imaging: DG Chest 2 View Result Date: 04/27/2023 CLINICAL DATA:  pleural effusion EXAM: CHEST - 2 VIEW COMPARISON:  Prior chest x-ray 04/10/2023 FINDINGS: Patient is status post median sternotomy with evidence of prior aortic valve replacement and mitral valve replacement. Small bilateral pleural effusions, improved compared to prior. Associated bibasilar atelectasis. No pneumothorax. No pulmonary edema. Cardiac and mediastinal contours are unchanged. IMPRESSION: 1. Small bilateral pleural effusions, improved compared to prior. 2. Bibasilar atelectasis. 3. No pneumothorax. 4. Surgical changes of prior aortic and mitral valve repair. Electronically Signed   By: Malachy Moan M.D.   On: 04/27/2023 12:57     Thomas [name] Thomas [name]) Denese Killings, M.D. [facility] 11:09 AM

## 2023-04-30 ENCOUNTER — Other Ambulatory Visit (INDEPENDENT_AMBULATORY_CARE_PROVIDER_SITE_OTHER): Payer: Self-pay

## 2023-04-30 ENCOUNTER — Ambulatory Visit (INDEPENDENT_AMBULATORY_CARE_PROVIDER_SITE_OTHER): Payer: 59 | Admitting: Orthopedic Surgery

## 2023-04-30 ENCOUNTER — Telehealth: Payer: Self-pay

## 2023-04-30 DIAGNOSIS — M25831 Other specified joint disorders, right wrist: Secondary | ICD-10-CM | POA: Diagnosis not present

## 2023-04-30 NOTE — Telephone Encounter (Signed)
 Wanted you to be aware that I provided patient with work note as discussed. He asked for note to be faxed to [employer] his employer. He stated when he left here he was just going to rip up the work note because he had to work so that he could pay his bills. He also said he planned to sign medical release form when he left to get his records.

## 2023-05-01 LAB — ACID FAST CULTURE WITH REFLEXED SENSITIVITIES (MYCOBACTERIA): Acid Fast Culture: NEGATIVE

## 2023-05-03 ENCOUNTER — Ambulatory Visit: Payer: 59

## 2023-05-04 ENCOUNTER — Other Ambulatory Visit: Payer: Self-pay

## 2023-05-04 ENCOUNTER — Telehealth: Payer: Self-pay

## 2023-05-04 ENCOUNTER — Ambulatory Visit

## 2023-05-04 MED ORDER — ENOXAPARIN SODIUM 80 MG/0.8ML IJ SOSY: 80.0000 mg | PREFILLED_SYRINGE | Freq: Two times a day (BID) | INTRAMUSCULAR | 1 refills | Status: DC

## 2023-05-04 NOTE — Telephone Encounter (Addendum)
 Called and spoke with pt concerning upcoming procedure on 05/09/23 and the need for Lovenox bridging instructions. Pt state he missed his [facility] Clinic appt today because he does not have a car. I offered to provide pt with Lovenox bridging instructions via MyChart, pt agreeable.    3/6: Last dose of warfarin.  3/7: No warfarin or enoxaparin (Lovenox).  3/8: Inject enoxaparin 80mg  in the fatty abdominal tissue at least 2 inches from the belly button twice a day about 12 hours apart, 8am and 8pm rotate sites. No warfarin.  3/9: Inject enoxaparin in the fatty tissue every 12 hours, 8am and 8pm. No warfarin.  3/10: Inject enoxaparin in the fatty tissue every 12 hours, 8am and 8pm. No warfarin.  3/11: Inject enoxaparin in the fatty tissue in the morning at 8 am (No PM dose). No warfarin.  3/12: Procedure Day - No enoxaparin - Resume warfarin in the evening or as directed by doctor - take an extra half tablet with usual dose.   3/13: Resume enoxaparin inject in the fatty tissue every 12 hours and take warfarin - take an extra half tablet with usual dose  3/14: Inject enoxaparin in the fatty tissue every 12 hours and take warfarin  3/15: Inject enoxaparin in the fatty tissue every 12 hours and take warfarin  3/16: Inject enoxaparin in the fatty tissue every 12 hours and take warfarin  3/17: Inject enoxaparin in the fatty tissue every 12 hours and take warfarin  3/18: Inject enoxaparin in the fatty tissue and warfarin appt to check INR.    Provided pt with dosing instructions as listed above. Made him aware these would be sent to him vai MyChart and he should call our coumadin clinic at [phone] with any questions or concerns. Pt verbalized understanding. Lovenox prescription sent to pharmacy.

## 2023-05-04 NOTE — Telephone Encounter (Signed)
 Lpmtcb to discuss INR appt, Coumadin Hold and Lovenox Bridging.

## 2023-05-07 ENCOUNTER — Other Ambulatory Visit: Payer: Self-pay | Admitting: Physician Assistant

## 2023-05-08 ENCOUNTER — Telehealth: Payer: Self-pay | Admitting: *Deleted

## 2023-05-08 NOTE — Telephone Encounter (Signed)
 Spoke with the patient, he has not taken warfarin since 3/7 until yesterday.  He has not been able to get the Lovenox bridging from his pharmacy, he was just informed his pharmacy got a batch of Lovenox and planning to pick it up soon.  At the urging of his girlfriend, he took double the dose of Coumadin last night.  His procedure is tomorrow.  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  At this time, we have no way of predicting his current INR.  We have decided to push his procedure back until 3/20.  I instructed the patient to continue with Coumadin until further instruction by our Coumadin clinic.  We have scheduled him a Coumadin clinic visit ER either in office to have INR checked tomorrow afternoon at 2 PM.  He is new procedure date is 1:30 PM on 3/20, he has been instructed to arrive 2 hours early for preparation.

## 2023-05-08 NOTE — Telephone Encounter (Signed)
 Call placed to patient to review Pericardiocentesis instructions for 05/09/23 at 2 PM. See 05/04/23 Telephone Note and Patient Message for pre-procedure instructions about holding warfarin/Lovenox bridge. Patient tells me that he has not done any Lovenox injections because [facility] was out of L[facility] and had to order for him. Patient states he had not taken warfarin since 05/04/23, but since he had not done any Lovenox injections, yesterday between 1-2 PM, he took 2 of his 4 mg (total 8 mg) warfarin tablets.  Patient states he received message from [facility] today that they had Lovenox for him now.  Patient advised that I will forward to [facility]/ Dr Excell Seltzer Azalee Course, PA for review and warfarin/Lovenox recommendations and if okay to proceed with Pericardiocentesis tomorrow. I have asked patient not to take either warfarin or Lovenox until I follow up with him today about recommendations.

## 2023-05-08 NOTE — Telephone Encounter (Signed)
 Patient has not injected any Lovenox yet despite procedure scheduled for tomorrow. Has also been taking extra warfarin. Recommend postponing procedure. Routing to Dr Excell Seltzer

## 2023-05-09 ENCOUNTER — Encounter: Payer: Self-pay | Admitting: Internal Medicine

## 2023-05-09 ENCOUNTER — Ambulatory Visit (HOSPITAL_COMMUNITY): Admission: RE | Admit: 2023-05-09 | Payer: 59 | Source: Ambulatory Visit | Admitting: Cardiovascular Disease

## 2023-05-09 ENCOUNTER — Encounter (HOSPITAL_COMMUNITY): Admission: RE | Payer: Self-pay | Source: Ambulatory Visit

## 2023-05-09 ENCOUNTER — Ambulatory Visit: Attending: Physician Assistant

## 2023-05-09 DIAGNOSIS — Z7901 Long term (current) use of anticoagulants: Secondary | ICD-10-CM

## 2023-05-09 DIAGNOSIS — I3139 Other pericardial effusion (noninflammatory): Secondary | ICD-10-CM

## 2023-05-09 DIAGNOSIS — Z01812 Encounter for preprocedural laboratory examination: Secondary | ICD-10-CM

## 2023-05-09 SURGERY — PERICARDIOCENTESIS
Anesthesia: LOCAL

## 2023-05-09 NOTE — Telephone Encounter (Signed)
 Pt was on my schedule this afternoon to check INR and get instructions for Lovenox bridge for upcoming procedure on 3/20.  He was a NO SHOW.  I called [facility] and he has not picked up his Lovenox injections either as instructed yesterday. How do you want to move forward?

## 2023-05-10 ENCOUNTER — Other Ambulatory Visit: Payer: Self-pay | Admitting: Physician Assistant

## 2023-05-10 ENCOUNTER — Telehealth: Payer: Self-pay | Admitting: Physician Assistant

## 2023-05-10 MED ORDER — POTASSIUM CHLORIDE CRYS ER 20 MEQ PO TBCR: 20.0000 meq | EXTENDED_RELEASE_TABLET | Freq: Two times a day (BID) | ORAL | 1 refills | Status: DC

## 2023-05-10 NOTE — Telephone Encounter (Signed)
 Notified by nurse that patient no showed for yesterday's Coumadin clinic visit, therefore unable to proceed with pericardiocentesis on 3/20 as previously planned.  Tentatively delayed the procedure until 3/31 at 10:30 AM, patient has been instructed to arrive by 8:30 AM.  Discussed the case with our clinical pharmacist, patient was scheduled to see me back on 3/18th at 10:05, our clinical pharmacist has added him on for the Coumadin clinic visit at 9:30 AM on the same day.  He has been instructed to pick up the Lovenox bridging.  I asked him to resume Coumadin until he sees our clinical pharmacist and able to get INR checked.  He has been instructed to resume Coumadin at the previous dose.  I sent a new prescription of potassium supplement to his pharmacy.  He is aware of the location of the Coumadin clinic visit and time.

## 2023-05-10 NOTE — Telephone Encounter (Signed)
 Pt c/o medication issue:  1. Name of Medication: enoxaparin (LOVENOX) 80 MG/0.8ML injection   2. How are you currently taking this medication (dosage and times per day)? New start, has not started yet  3. Are you having a reaction (difficulty breathing--STAT)? No  4. What is your medication issue? Pt needs cb to discuss med instructions, needs to know when to start

## 2023-05-11 NOTE — Telephone Encounter (Signed)
 I spoke to patient and reviewed upcoming appt on Tuesday 3/18 here at [facility].

## 2023-05-11 NOTE — Telephone Encounter (Signed)
 Pt calling back to get instructions on the injection the was prescribed to him yesterday, pt doesn't want to move forward with the injection until he is sure of the instructions. Please advise

## 2023-05-11 NOTE — Telephone Encounter (Signed)
 Patient identification verified by 2 forms. Marilynn Rail, RN    Called and spoke to patient  Patient states:   -Procedure was rescheduled for 3/31  -would like to review bridging instructions for Lovenox  Informed patient message sent for assistance  Patient verbalized understanding, no questions at this time

## 2023-05-14 ENCOUNTER — Encounter

## 2023-05-15 ENCOUNTER — Telehealth: Payer: Self-pay

## 2023-05-15 ENCOUNTER — Encounter: Payer: Self-pay | Admitting: Physician Assistant

## 2023-05-15 ENCOUNTER — Ambulatory Visit

## 2023-05-15 ENCOUNTER — Ambulatory Visit (INDEPENDENT_AMBULATORY_CARE_PROVIDER_SITE_OTHER)

## 2023-05-15 ENCOUNTER — Ambulatory Visit: Payer: 59 | Attending: Physician Assistant | Admitting: Physician Assistant

## 2023-05-15 VITALS — BP 101/62 | HR 98 | Ht 71.0 in | Wt 180.8 lb

## 2023-05-15 DIAGNOSIS — I10 Essential (primary) hypertension: Secondary | ICD-10-CM

## 2023-05-15 DIAGNOSIS — Z79899 Other long term (current) drug therapy: Secondary | ICD-10-CM

## 2023-05-15 DIAGNOSIS — I3139 Other pericardial effusion (noninflammatory): Secondary | ICD-10-CM | POA: Diagnosis not present

## 2023-05-15 DIAGNOSIS — Z7901 Long term (current) use of anticoagulants: Secondary | ICD-10-CM

## 2023-05-15 DIAGNOSIS — R200 Anesthesia of skin: Secondary | ICD-10-CM

## 2023-05-15 DIAGNOSIS — Z952 Presence of prosthetic heart valve: Secondary | ICD-10-CM

## 2023-05-15 DIAGNOSIS — R600 Localized edema: Secondary | ICD-10-CM

## 2023-05-15 DIAGNOSIS — I330 Acute and subacute infective endocarditis: Secondary | ICD-10-CM

## 2023-05-15 LAB — CBC

## 2023-05-15 LAB — POCT INR: INR: 1.3 — AB (ref 2.0–3.0)

## 2023-05-15 NOTE — Patient Instructions (Signed)
 Take 2 tablets today and tomorrow only then continue 1.5 tablets daily, except 1 tablet on Mondays, Wednesdays and Fridays. INR in 3 weeks [phone] Pericardiocentesis 3/31 Lovenox Bridge  3/25: Last dose of warfarin.  3/26: No warfarin or enoxaparin (Lovenox).  3/27: Inject enoxaparin 80mg  in the fatty abdominal tissue at least 2 inches from the belly button twice a day about 12 hours apart, 8am and 8pm rotate sites. No warfarin.  3/28: Inject enoxaparin in the fatty tissue every 12 hours, 8am and 8pm. No warfarin.  3/29: Inject enoxaparin in the fatty tissue every 12 hours, 8am and 8pm. No warfarin.  3/30: Inject enoxaparin in the fatty tissue in the morning at 8 am (No PM dose). No warfarin.  3/31: Procedure Day - No enoxaparin - Resume warfarin in the evening or as directed by doctor (take an extra half tablet with usual dose for 2 days then resume normal dose).  4/1: Resume enoxaparin inject in the fatty tissue every 12 hours and take warfarin  4/2: Inject enoxaparin in the fatty tissue every 12 hours and take warfarin  4/3: Inject enoxaparin in the fatty tissue every 12 hours and take warfarin  4/4: Inject enoxaparin in the fatty tissue every 12 hours and take warfarin  4/5 and 4/6: Inject enoxaparin in the fatty tissue every 12 hours and take warfarin  4/7: warfarin appt to check INR.

## 2023-05-15 NOTE — Telephone Encounter (Signed)
 Lpmtcb to check if he's on the way for appts.

## 2023-05-15 NOTE — Patient Instructions (Signed)
 Medication Instructions:  STOP AMLODIPINE  *If you need a refill on your cardiac medications before your next appointment, please call your pharmacy*   Lab Work: CBC,BMP AND BNP TODAY If you have labs (blood work) drawn today and your tests are completely normal, you will receive your results only by: MyChart Message (if you have MyChart) OR A paper copy in the mail If you have any lab test that is abnormal or we need to change your treatment, we will call you to review the results.   Testing/Procedures:[address] Your physician has requested that you have a upper extremity arterial duplex. This test is an ultrasound of the arteries in the legs or arms. It looks at arterial blood flow in the legs and arms. Allow one hour for Lower and Upper Arterial scans. There are no restrictions or special instructions.  Please note: We ask at that you not bring children with you during ultrasound (echo/ vascular) testing. Due to room size and safety concerns, children are not allowed in the ultrasound rooms during exams. Our front office staff cannot provide observation of children in our lobby area while testing is being conducted. An adult accompanying a patient to their appointment will only be allowed in the ultrasound room at the discretion of the ultrasound technician under special circumstances. We apologize for any inconvenience.    Follow-Up: At [facility], you and your health needs are our priority.  As part of our continuing mission to provide you with exceptional heart care, we have created designated Provider Care Teams.  These Care Teams include your primary Cardiologist (physician) and Advanced Practice Providers (APPs -  Physician Assistants and Nurse Practitioners) who all work together to provide you with the care you need, when you need it   Your next appointment:   2-3 week(s) AFTER 05/28/23  Provider:   Azalee Course, PA    Other Instructions ON THE DAY OF  PROCEDURE 05/28/23 ARRIVE AT [facility] AT 7:30 AM  IF DIZZINESS AND/OR CHEST PAIN WORSENS GO TO EMERGENCY ROOM

## 2023-05-15 NOTE — Progress Notes (Unsigned)
 Cardiology Office Note:  .   Date:  05/16/2023  ID:  Thomas [name], DOB Feb 23, 1961, MRN [id number] PCP: Assunta Found, MD  [facility] Providers Cardiologist:  Tonny Bollman, MD     History of Present Illness: .   Thomas [name] is a 63 y.o. male with PMH of HTN, left nephrectomy at age 63 (due to kidney stones?), remote substance abuse (cocaine, snort, never injected, last use 2010), anxiety, GERD and thrombocytopenia. He underwent R ulnar shortening osteotomy in August 2024. He later developed hardware failure and osteolysis requiring plate removal and bone biopsy in Dec 2024. Culture was negative. 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 Since his procedure, he has developed intermittent fever. He presented to the hospital on 03/07/2022 with CHF. TTE on 03/09/2023 showed EF > 75%, severe AI with linear shaped echodensity measuring 2 cm x 0.35 cm concerning for endocarditis. 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 EF was 60 to 65%. Blood culture came back positive for Streptococcus infantarius (formerly known as strep bovis). He was treated empiric abx. He was seen by Dr. Dorris Fetch for consideration of valve replacement surgery. Pre-surgical cath on 03/13/2023 showed 60% prox to mid LAD lesion, 60% stenosis in side branch of D1, 30% mid to distal LAD, 50% distal LAD. Doppler obtained on 1/15 showed 1-39% bilateral ICA disease. He underwent 21 mm Onyx Mechanical AVR and 31-51mm Onyx Mechanical MVR on 03/16/2023 by Dr. Dorris Fetch. Post procedure, telemetry showed nonspecific intraventricular conduction delay followed by accelerated junctional rhythm. Beta blocker was not started. He was seen by Dr. Nelly Laurence who recommended continue monitoring. CHB resolved, EP decided to hold off PPM implant. He did have postop afib. He remainted on  PCN G for strep species. He had left thoracentesis with removal of 1200 cc fluid on 1/24. 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  Since discharged, he underwent repeat L thoracentesis with removal of 1.5 L on 04/06/2023. Repeat echo 04/09/2023 showed stable aortic and mitral valve replacement, EF 65-70%, no RWMA, RVSP 38 mmHg, severe biatrial enlargement, large pericardial effusion and moderate left pleural effusion.   I saw the patient on 04/13/2023, at which time he had 3+ pitting edema in bilateral lower extremity.  He was on 40 mg twice a day of Lasix.  I increased the Lasix to 80 mg a.m. and 40 mg p.m. for 3 days before going back down to 40 mg twice a day dosing.  Repeat echocardiogram obtained on 04/26/2023 showed EF 65 to 70%, no regional wall motion abnormality, normal RV, severe left atrial enlargement, trivial MR with mechanical valve replacement, mechanical aortic valve present, pericardial effusion measuring up to 2.25 cm unchanged in size when compared to the previous echocardiogram on 04/09/2023.  There was no evidence of cardiac tamponade.  Due to persistent lower extremity edema, I switched him to 40 mg a.m. and 20 mg p.m. of torsemide during the last office visit on 2/28.  I reached out to cardiothoracic surgery service and Dr. Excell Seltzer, we ultimately decided to proceed with pericardiocentesis as outpatient.  However patient was unable to obtain Lovenox injection in time for the procedure to occur on 3/12, we rescheduled it to 3/20, however he no showed for the Coumadin clinic visit necessary for the procedure to occur on 3/20 either.  We  eventually push the procedure back to 3/31.  He presents today for evaluation prior to his 3/31 pericardiocentesis.  Patient presents today for follow-up.  He has lost 6 pounds since the last time I saw him after starting on the torsemide.  I previously instruct him to hold off on taking  his blood pressure medication amlodipine.  I will remove amlodipine from his medication list.  He still denies any shortness of breath.  Dr. Excell Seltzer recommended limited echocardiogram in the morning of the procedure to reassess the pericardial effusion.  He denies any feeling of syncope or dizziness.  He has been seen by our Coumadin clinic today, INR is subtherapeutic.  New Coumadin dosing and Lovenox instruction has been given to them.  His lower extremity edema has also improved.  On physical exam, he has trace amount of left pleural effusion however this has improved as well.  He did have a question and he is on his impression when the home health nurse pulled the PICC line on 2/14, only a shortstop came out he said at the full line, however there was no notification of PICC line breaking off, on recent chest x-ray from 2/28, we did not see any residual PICC line either.  Patient has been informed to monitor symptoms, if still symptomatic after pericardiocentesis, may need to consider chest CT.  ROS:   He denies chest pain, palpitations, dyspnea, pnd, orthopnea, n, v, dizziness, syncope, edema, weight gain, or early satiety. All other systems reviewed and are otherwise negative except as noted above.    Studies Reviewed: [name]   EKG Interpretation Date/Time:  Tuesday May 15 2023 10:57:36 EDT Ventricular Rate:  95 PR Interval:  168 QRS Duration:  74 QT Interval:  380 QTC Calculation: 477 R Axis:   45  Text Interpretation: Normal sinus rhythm Normal ECG When compared with ECG of 19-Mar-2023 07:47, Sinus rhythm has replaced Wide QRS rhythm Confirmed by Azalee Course [phone]) on 05/16/2023 3:12:50 PM    Cardiac Studies & Procedures   ______________________________________________________________________________________________ CARDIAC CATHETERIZATION  CARDIAC CATHETERIZATION 03/13/2023  Narrative Images from the original result were not included.    Moderate Single-Vessel Disease: Prox LAD to Mid  LAD lesion is 60% stenosed with 60% stenosed side branch in 1st Diag. Mid LAD to Dist LAD lesion is 30% stenosed.  Dist LAD lesion is 50% stenosed.  Dominance: Right   The patient was hypotensive with central aortic pressures in the 70s to 80s during the procedure.  This improved with 500 mL bolus.  Post procedural radial arterial pressures were in the 110-115/48-50 mmHg.  This differs by at least 30 to 35 mmHg from the femoral arterial blood pressure cuff.   RECOMMENDATIONS Patient will return to the nursing floor for ongoing care with CVTS consultation. LAD lesions are not likely flow-limiting and were not causing symptoms prior to this hospitalization.  Based on the extent of operation, would consider medical therapy with PCI at a later date if indicated by symptoms/stress test. Restart aspirin 81 mg daily  Bryan Lemma, MD  Findings Coronary Findings Diagnostic  Dominance: Right  Left Main Vessel was injected. Vessel is large.  Left Anterior Descending Vessel is large. Prox LAD to Mid LAD lesion is 60% stenosed with 60% stenosed side branch in 1st Diag. Mid LAD to Dist LAD lesion is 30% stenosed. Dist LAD lesion is 50% stenosed.  Second Diagonal Branch Vessel is moderate in size.  Left Circumflex Vessel is large. The vessel courses distally is a large lateral  OM branch.  First Obtuse Marginal Branch Vessel is small in size.  Second Obtuse Marginal Branch Vessel is moderate in size.  Lateral Second Obtuse Marginal Branch Vessel is small in size.  Fourth Obtuse Marginal Branch Vessel is small in size.  Left Posterior Atrioventricular Artery Vessel is small in size.  Right Coronary Artery Vessel was injected. Vessel is large. The vessel exhibits minimal luminal irregularities.  Right Ventricular Branch Vessel is small in size.  Inferior Septal Vessel is small in size.  First Right Posterolateral Branch Vessel is small in size.  Second Right  Posterolateral Branch Vessel is small in size.  Intervention  No interventions have been documented.     ECHOCARDIOGRAM  ECHOCARDIOGRAM COMPLETE 04/26/2023  Narrative ECHOCARDIOGRAM REPORT    Patient Name:   Thomas [name] [facility] Date of Exam: 04/26/2023 Medical Rec #:  [id number]         Height:       71.0 in Accession #:    [id number]        Weight:       193.0 lb Date of Birth:  1960/08/13         BSA:          2.077 m Patient Age:    62 years          BP:           136/72 mmHg Patient Gender: M                 HR:           81 bpm. Exam Location:  [address]  Procedure: 2D Echo, 3D Echo, Cardiac Doppler and Color Doppler (Both Spectral and Color Flow Doppler were utilized during procedure).  Indications:    Pericardial effusion I31.3  History:        Patient has prior history of Echocardiogram examinations, most recent 04/09/2023. CHF; Risk Factors:Hypertension and ETOH. Pericardial Effusion. Aortic Valve: 21 mm ON-X Prosthetic mechanical valve is present in the aortic position. Procedure Date: 03/16/23. Mitral Valve: 31/33 ON-X Prosthetic mechanical valve valve is present in the mitral position. Procedure Date: 03/16/23.  Sonographer:    Eulah Pont RDCS Referring Phys: [id number] STEVEN C HENDRICKSON  IMPRESSIONS   1. Left ventricular ejection fraction, by estimation, is 65 to 70%. The left ventricle has normal function. The left ventricle has no regional wall motion abnormalities. Left ventricular diastolic parameters are indeterminate. 2. Right ventricular systolic function is normal. The right ventricular size is normal. There is normal pulmonary artery systolic pressure. 3. Left atrial size was severely dilated. 4. Right atrial size was mildly dilated. 5. The mitral valve has been repaired/replaced. Trivial mitral valve regurgitation. No evidence of mitral stenosis. The mean mitral valve gradient is 7.0 mmHg with average heart rate of 81 bpm. There is a 31/33 ON-X  Prosthetic mechanical valve present in the mitral position. Procedure Date: 03/16/23. 6. Pericardial effusion measuring up to 2.25 cm. Unchanged in size from 04/09/23. There is no evidence of cardiac tamponade. 7. The aortic valve has been repaired/replaced. Aortic valve regurgitation is not visualized. No aortic stenosis is present. There is a 21 mm ON-X Prosthetic mechanical valve present in the aortic position. Procedure Date: 03/16/23. Aortic valve area, by VTI measures 1.83 cm. Aortic valve mean gradient measures 21.0 mmHg. Aortic valve Vmax measures 2.88 m/s. 8. The inferior vena cava is dilated in size with >50% respiratory variability, suggesting right atrial pressure of 8 mmHg.  FINDINGS Left Ventricle: Left ventricular  ejection fraction, by estimation, is 65 to 70%. The left ventricle has normal function. The left ventricle has no regional wall motion abnormalities. Strain imaging was not performed. 3D ejection fraction reviewed and evaluated as part of the interpretation. Alternate measurement of EF is felt to be most reflective of LV function. The left ventricular internal cavity size was normal in size. There is no left ventricular hypertrophy. Left ventricular diastolic parameters are indeterminate.  Right Ventricle: The right ventricular size is normal. No increase in right ventricular wall thickness. Right ventricular systolic function is normal. There is normal pulmonary artery systolic pressure. The tricuspid regurgitant velocity is 2.54 m/s, and with an assumed right atrial pressure of 8 mmHg, the estimated right ventricular systolic pressure is 33.8 mmHg.  Left Atrium: Left atrial size was severely dilated.  Right Atrium: Right atrial size was mildly dilated.  Pericardium: Pericardial effusion measuring up to 2.25 cm. Unchanged in size from 04/09/23. There is no evidence of pericardial effusion. The pericardial effusion is circumferential. There is no evidence of cardiac  tamponade.  Mitral Valve: The mitral valve has been repaired/replaced. Trivial mitral valve regurgitation. There is a 31/33 ON-X Prosthetic mechanical valve present in the mitral position. Procedure Date: 03/16/23. No evidence of mitral valve stenosis. MV peak gradient, 16.0 mmHg. The mean mitral valve gradient is 7.0 mmHg with average heart rate of 81 bpm.  Tricuspid Valve: The tricuspid valve is normal in structure. Tricuspid valve regurgitation is mild . No evidence of tricuspid stenosis.  Aortic Valve: The aortic valve has been repaired/replaced. Aortic valve regurgitation is not visualized. No aortic stenosis is present. Aortic valve mean gradient measures 21.0 mmHg. Aortic valve peak gradient measures 33.2 mmHg. Aortic valve area, by VTI measures 1.83 cm. There is a 21 mm ON-X Prosthetic mechanical valve present in the aortic position. Procedure Date: 03/16/23.  Pulmonic Valve: The pulmonic valve was normal in structure. Pulmonic valve regurgitation is trivial. No evidence of pulmonic stenosis.  Aorta: The aortic root is normal in size and structure.  Venous: The inferior vena cava is dilated in size with greater than 50% respiratory variability, suggesting right atrial pressure of 8 mmHg.  IAS/Shunts: No atrial level shunt detected by color flow Doppler.  Additional Comments: 3D was performed not requiring image post processing on an independent workstation and was normal.   LEFT VENTRICLE PLAX 2D LVIDd:         4.39 cm LVIDs:         2.56 cm LV PW:         1.05 cm LV IVS:        0.91 cm LVOT diam:     2.13 cm   3D Volume EF: LV SV:         107       3D EF:        55 % LV SV Index:   52        LV EDV:       178 ml LVOT Area:     3.56 cm  LV ESV:       80 ml LV SV:        99 ml  RIGHT VENTRICLE RV S prime:     10.90 cm/s TAPSE (M-mode): 1.8 cm  LEFT ATRIUM              Index        RIGHT ATRIUM           Index LA diam:  5.95 cm  2.86 cm/m   RA Area:     20.50  cm LA Vol (A2C):   97.1 ml  46.75 ml/m  RA Volume:   68.20 ml  32.84 ml/m LA Vol (A4C):   102.0 ml 49.11 ml/m LA Biplane Vol: 106.0 ml 51.04 ml/m AORTIC VALVE AV Area (Vmax):    1.99 cm AV Area (Vmean):   1.89 cm AV Area (VTI):     1.83 cm AV Vmax:           288.00 cm/s AV Vmean:          217.333 cm/s AV VTI:            0.585 m AV Peak Grad:      33.2 mmHg AV Mean Grad:      21.0 mmHg LVOT Vmax:         161.00 cm/s LVOT Vmean:        115.000 cm/s LVOT VTI:          0.301 m LVOT/AV VTI ratio: 0.51  AORTA Ao Root diam: 3.40 cm Ao Asc diam:  3.11 cm  MITRAL VALVE                TRICUSPID VALVE MV Area (PHT): 3.60 cm     TR Peak grad:   25.8 mmHg MV Area VTI:   2.46 cm     TR Vmax:        254.00 cm/s MV Peak grad:  16.0 mmHg MV Mean grad:  7.0 mmHg     SHUNTS MV Vmax:       2.00 m/s     Systemic VTI:  0.30 m MV Vmean:      117.0 cm/s   Systemic Diam: 2.13 cm MV Decel Time: 211 msec MV E velocity: 180.00 cm/s MV A velocity: 80.50 cm/s MV E/A ratio:  2.24  Chilton Si MD Electronically signed by Chilton Si MD Signature Date/Time: 04/26/2023/6:25:39 PM    Final   TEE  ECHO INTRAOPERATIVE TEE 03/16/2023  Narrative *INTRAOPERATIVE TRANSESOPHAGEAL REPORT *    Patient Name:   DELIA SITAR [facility] Date of Exam: 03/16/2023 Medical Rec #:  [id number]         Height:       71.0 in Accession #:    [id number]        Weight:       168.7 lb Date of Birth:  1961/01/16         BSA:          1.96 m Patient Age:    62 years          BP: Patient Gender: M                 HR: Exam Location:  Anesthesiology  Transesophogeal exam was perform intraoperatively during surgical procedure. Patient was closely monitored under general anesthesia during the entirety of examination.  Indications:     MVR Clement Husbands Performing Phys: Anice Paganini, DO  Complications: No known complications during this procedure. POST-OP IMPRESSIONS _ Left Ventricle: The left ventriclar function  remains normal on inotropic support. _ Right Ventricle: The right ventriclar function is normal on inotropic support. _ Aorta: The aorta appears unchanged from pre-bypass. There is no dissection _ Aortic Valve: There is a mechanical valve in the aortic position the valve is well seated with no paravalvular leak. Trace intravalvular regurgiation jets visualized consistent with normal washing jets. Mean PG . Vmax 2.8 m/s _ Mitral Valve: There  is a mechanical valve in the mitral position. The valve is well seated with no paravalvular leak. Trace regurgitation jets visualized consistent with normal washing jets. _ Tricuspid Valve: The tricuspid valve appears unchanged from pre-bypass. There is mild TR. _ Pulmonic Valve: The pulmonic valve appears unchanged from pre-bypass. _ Interatrial Septum: The interatrial septum appears intact with no shunt following surgical closure. _ Pericardium: There is no pericardial effusion.  PRE-OP FINDINGS Left Ventricle: The left ventricle has normal systolic function, with an ejection fraction of 61.5% by Simposon's biplane method of discs. The cavity size was normal.   Right Ventricle: The right ventricle has normal systolic function. The cavity was normal. There is no increase in right ventricular wall thickness. TAPSE 2.34cm  Left Atrium: No left atrial/left atrial appendage thrombus was detected.  Right Atrium: Right atrial pressure is estimated at 20 mmHg.  Interatrial Septum: No atrial level shunt detected by color flow Doppler. There is no evidence of a patent foramen ovale.  Pericardium: There is no evidence of pericardial effusion. There is a moderate pleural effusion in both left and right lateral regions.  Mitral Valve: The mitral valve is normal in structure. There are multiple vegetations noted on the mitral valve leaflets, resulting in severe mitral regurgitation.  Tricuspid Valve: The tricuspid valve was normal in structure.  Tricuspid valve regurgitation is trivial by color flow Doppler. There is no evidence of valve vegetations / endocarditis.  Aortic Valve: The aortic valve is trileaflet. The leaflets open well. There is thickening of the leaflets with with vegetation noted. Findings consistent with preop diagnosis of endocarditis in the setting of known bactermia. There is severe aortic insufficiency. No aortic stenosis   Pulmonic Valve: The pulmonic valve was normal in structure. Pulmonic valve regurgitation is trivial by color flow Doppler.   Aorta: The aortic root, ascending aorta, and aortic arch are normal in size and structure. There is no dissection.  +--------------+--------++ LEFT VENTRICLE               +----------------+---------++ +--------------+--------++       Diastology                PLAX 2D                      +----------------+---------++ +--------------+--------++       LV e' lateral:  7.10 cm/s LVOT diam:    2.00 cm        +----------------+---------++ +--------------+--------++       LV E/e' lateral:25.6      LVOT Area:    3.14 cm       +----------------+---------++ +--------------+--------++                        +--------------+--------++  +------------------+---------++ LV Volumes (MOD)            +------------------+---------++ LV area d, A2C:   51.00 cm +------------------+---------++ LV area d, A4C:   57.30 cm +------------------+---------++ LV area s, A2C:   27.80 cm +------------------+---------++ LV area s, A4C:   32.80 cm +------------------+---------++ LV major d, A2C:  10.50 cm  +------------------+---------++ LV major d, A4C:  10.90 cm  +------------------+---------++ LV major s, A2C:  8.43 cm   +------------------+---------++ LV major s, A4C:  9.07 cm   +------------------+---------++ LV vol d, MOD A2C:206.0 ml  +------------------+---------++ LV vol d, MOD A4C:246.0 ml   +------------------+---------++ LV vol s, MOD A2C:75.3 ml   +------------------+---------++ LV vol s, MOD A4C:96.9 ml   +------------------+---------++  LV SV MOD A2C:    130.7 ml  +------------------+---------++ LV SV MOD A4C:    246.0 ml  +------------------+---------++ LV SV MOD BP:     140.7 ml  +------------------+---------++  +---------------+------+-------+ RIGHT VENTRICLE              +---------------+------+-------+ RV FAC:        28.0 %        +---------------+------+-------+ TAPSE (M-mode):2.3 cm2.37 cm +---------------+------+-------+  +------------------+------------++  +--------------+-----------++ AORTIC VALVE                    PULMONIC VALVE            +------------------+------------++  +--------------+-----------++ AV Area (Vmax):   2.12 cm      PV Vmax:      0.57 m/s    +------------------+------------++  +--------------+-----------++ AV Area (Vmean):  1.97 cm      PV Vmean:     37.600 cm/s +------------------+------------++  +--------------+-----------++ AV Area (VTI):    1.83 cm      PV VTI:       0.122 m     +------------------+------------++  +--------------+-----------++ AV Vmax:          257.50 cm/s   PV Peak grad: 1.3 mmHg    +------------------+------------++  +--------------+-----------++ AV Vmean:         175.500 cm/s  PV Mean grad: 1.0 mmHg    +------------------+------------++  +--------------+-----------++ AV VTI:           0.567 m      +------------------+------------++ AV Peak Grad:     26.5 mmHg    +------------------+------------++ AV Mean Grad:     15.0 mmHg    +------------------+------------++ LVOT Vmax:        174.00 cm/s  +------------------+------------++ LVOT Vmean:       110.000 cm/s +------------------+------------++ LVOT VTI:         0.331 m      +------------------+------------++ LVOT/AV VTI ratio:0.58          +------------------+------------++ AR PHT:           230 msec     +------------------+------------++  +--------------+-------++ AORTA                 +--------------+-------++ Ao Sinus diam:3.20 cm +--------------+-------++ Ao STJ diam:  2.7 cm  +--------------+-------++  +--------------+----------++  +---------------+-----------++ MITRAL VALVE              TRICUSPID VALVE            +--------------+----------++  +---------------+-----------++ MV Area (PHT):4.15 cm    TR Peak grad:  18.0 mmHg   +--------------+----------++  +---------------+-----------++ MV Peak grad: 13.2 mmHg   TR Vmax:       212.00 cm/s +--------------+----------++  +---------------+-----------++ MV Mean grad: 5.0 mmHg   +--------------+----------++  +--------------+-------+ MV Vmax:      1.82 m/s    SHUNTS                +--------------+----------++  +--------------+-------+ MV Vmean:     91.3 cm/s   Systemic VTI: 0.33 m  +--------------+----------++  +--------------+-------+ MV VTI:       0.36 m      Systemic Diam:2.00 cm +--------------+----------++  +--------------+-------+ MV PHT:       53.07 msec +--------------+----------++ MV Decel Time:183 msec   +--------------+----------++ +--------------+-----------++ MV E velocity:182.00 cm/s +--------------+-----------++ MV A velocity:46.70 cm/s  +--------------+-----------++ MV E/A ratio: 3.90        +--------------+-----------++   Anice Paganini Electronically signed  by Anice Paganini Signature Date/Time: 03/16/2023/2:18:30 PM    Final        ______________________________________________________________________________________________      Risk Assessment/Calculations:             Physical Exam:   VS:  BP 101/62   Pulse 98   Ht 5\' 11"  (1.803 m)   Wt 180 lb 12.8 oz (82 kg)   SpO2 97%   BMI 25.22 kg/m    Wt Readings from Last 3 Encounters:  05/15/23 180  lb 12.8 oz (82 kg)  04/27/23 186 lb 3.2 oz (84.5 kg)  04/27/23 188 lb 6.4 oz (85.5 kg)    GEN: Well nourished, well developed in no acute distress NECK: No JVD; No carotid bruits CARDIAC: RRR, no murmurs, rubs, gallops RESPIRATORY:  Clear to auscultation without rales, wheezing or rhonchi  ABDOMEN: Soft, non-tender, non-distended EXTREMITIES:  No edema; No deformity   ASSESSMENT AND PLAN: .     Pericardial effusion Persistent large pericardial effusion without cardiac tamponade. Scheduled for pericardiocentesis to alleviate pressure and improve symptoms. - Perform pericardiocentesis on May 28, 2023. -Risk and the benefit of the procedure has been explained to the patient who is agreeable to proceed. - Instruct him to arrive at 7:30 AM on the day of the procedure for limited echocardiogram prior to the procedure.  Clinical pharmacist have reviewed Lovenox bridging and Coumadin instruction.  Lower extremity edema Persistent edema improved with diuretic adjustment. Positive response to torsemide. - Continue torsemide 40 mg AM and 20 mg PM. - Monitor for worsening edema or dyspnea.  -Obtain basic metabolic panel, BNP and CBC today  Mechanical aortic and mitral valve: Recently underwent surgery.  Some sternotomy soreness.  Numbness of the left hand: Obtain left upper extremity arterial ultrasound.  For some reason, patient manage she is that when the home health nurse pulled the PICC line, only a short segment came out instead of the entire line.  However on x-ray, I did not see any evidence of retained PICC line segment in the venous system.  Patient denies any symptom.  There was no report from home health nurse regarding any retained PICC line.  Will continue observation.  If still symptomatic in the future, may need to consider CT of the chest.  Hypertension Amlodipine held due to fluid management. Blood pressure management secondary to fluid overload. - Monitor for worsening dyspnea  or chest pain. - Instruct him to seek emergency care if symptoms worsen.  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  PICC line discontinued on 2/14.  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      Informed Consent   Shared Decision Making/Informed Consent The risks [bleeding, infection, cardiac arrest, damage to the heart or blood vessels requiring surgery, pneumothorax requiring chest tube placement, arrhythmias, changes in blood pressure, organ injury, and rarely death], benefits [therapeutic relief of fluid collection around the heart, diagnostic support], and alternatives of a pericardiocentesis were discussed in detail with Mr. Heavin and he agrees to proceed.     Dispo: Follow-up 2 to 3 weeks after pericardiocentesis  Signed, Azalee Course, PA

## 2023-05-17 LAB — CBC
Hematocrit: 28.8 % — ABNORMAL LOW (ref 37.5–51.0)
Hemoglobin: 8.8 g/dL — ABNORMAL LOW (ref 13.0–17.7)
MCH: 24.2 pg — ABNORMAL LOW (ref 26.6–33.0)
MCHC: 30.6 g/dL — ABNORMAL LOW (ref 31.5–35.7)
MCV: 79 fL (ref 79–97)
Platelets: 286 10*3/uL (ref 150–450)
RBC: 3.64 x10E6/uL — ABNORMAL LOW (ref 4.14–5.80)
RDW: 16.1 % — ABNORMAL HIGH (ref 11.6–15.4)
WBC: 5.5 10*3/uL (ref 3.4–10.8)

## 2023-05-17 LAB — BASIC METABOLIC PANEL
BUN/Creatinine Ratio: 21 (ref 10–24)
BUN: 22 mg/dL (ref 8–27)
CO2: 23 mmol/L (ref 20–29)
Calcium: 9 mg/dL (ref 8.6–10.2)
Chloride: 97 mmol/L (ref 96–106)
Creatinine, Ser: 1.05 mg/dL (ref 0.76–1.27)
Glucose: 103 mg/dL — ABNORMAL HIGH (ref 70–99)
Potassium: 4.6 mmol/L (ref 3.5–5.2)
Sodium: 134 mmol/L (ref 134–144)
eGFR: 80 mL/min/{1.73_m2} (ref >59)

## 2023-05-17 LAB — BRAIN NATRIURETIC PEPTIDE

## 2023-05-17 NOTE — Telephone Encounter (Signed)
 Thank you Thurston Hole, I did tell him to arrive at 7:30 AM on the day of the procedure.

## 2023-05-21 LAB — ACID FAST CULTURE WITH REFLEXED SENSITIVITIES (MYCOBACTERIA): Acid Fast Culture: NEGATIVE

## 2023-05-23 ENCOUNTER — Encounter

## 2023-05-23 ENCOUNTER — Telehealth: Payer: Self-pay | Admitting: Cardiovascular Disease

## 2023-05-23 NOTE — Telephone Encounter (Signed)
 Patient called to report medications he is taking as requested prior to his procedure.  acetaminophen (TYLENOL) 325 MG tablet  atorvastatin (LIPITOR) 40 MG tablet  enoxaparin (LOVENOX) 80 MG/0.8ML injection (started today) magnesium oxide (MAG-OX) 400 (240 Mg) MG tablet  omeprazole (PRILOSEC OTC) 20 MG tablet  potassium chloride SA (KLOR-CON M) 20 MEQ tablet  torsemide (DEMADEX) 20 MG tablet    Patient noted he stopped his warfarin (COUMADIN) 4 MG tablet medication today. Patient noted he is no longer taking predniSONE (STERAPRED UNI-PAK 21 TAB) 10 MG (21) TBPK tablet

## 2023-05-24 ENCOUNTER — Telehealth: Payer: Self-pay | Admitting: *Deleted

## 2023-05-24 NOTE — Telephone Encounter (Addendum)
 Pericardiocentesis scheduled at [facility] for: Monday May 28, 2023 10:30 AM Arrival time [facility] Main Entrance A at: 7:30 AM-needs limited echo prior to procedure  Nothing to eat or drink after midnight.  Medication instructions: -Hold:  Warfarin/enoxaparin- see 05/15/23 Anti-Coag Note for details  Patient tells me that he did not take any warfarin starting 05/23/23 and knows to hold until post procedure  Enoxaparin-see 05/15/23 Anti-coag note for details  Patient aware he needs to start enoxaparin injections today-tells me he has printed instructions to follow and I reviewed with him.  Torsemide/KCl-AM of procedure -Other usual morning medications can be taken with sips of water.  Plan to go home the same day, you will only stay overnight if medically necessary.  You must have responsible adult to drive you home.  Someone must be with you the first 24 hours after you arrive home.  Reviewed procedure instructions with patient.

## 2023-05-25 DIAGNOSIS — I3139 Other pericardial effusion (noninflammatory): Secondary | ICD-10-CM | POA: Insufficient documentation

## 2023-05-28 ENCOUNTER — Ambulatory Visit (HOSPITAL_BASED_OUTPATIENT_CLINIC_OR_DEPARTMENT_OTHER)

## 2023-05-28 ENCOUNTER — Other Ambulatory Visit: Payer: Self-pay

## 2023-05-28 ENCOUNTER — Encounter (HOSPITAL_COMMUNITY)
Admission: RE | Disposition: A | Discharge status: Home / Self Care | Payer: Self-pay | Source: Ambulatory Visit | Attending: Cardiovascular Disease

## 2023-05-28 ENCOUNTER — Ambulatory Visit (HOSPITAL_COMMUNITY)
Admission: RE | Admit: 2023-05-28 | Discharge: 2023-05-28 | Disposition: A | Discharge status: Home / Self Care | Source: Ambulatory Visit | Attending: Cardiovascular Disease | Admitting: Cardiovascular Disease

## 2023-05-28 DIAGNOSIS — I3139 Other pericardial effusion (noninflammatory): Secondary | ICD-10-CM

## 2023-05-28 DIAGNOSIS — Z01812 Encounter for preprocedural laboratory examination: Secondary | ICD-10-CM

## 2023-05-28 LAB — ECHOCARDIOGRAM LIMITED
AR max vel: 2.2 cm2
AV Area VTI: 2.35 cm2
AV Area mean vel: 2.15 cm2
AV Mean grad: 15 mmHg
AV Peak grad: 27 mmHg
Ao pk vel: 2.6 m/s

## 2023-05-28 SURGERY — PERICARDIOCENTESIS
Anesthesia: LOCAL

## 2023-05-28 MED ORDER — SODIUM CHLORIDE 0.9 % IV SOLN: INTRAVENOUS | Status: DC

## 2023-05-28 NOTE — Discharge Instructions (Addendum)
 Mon 3/31 Tue 4/1 Wed 4/2 Thu 4/3 Fri 4/4       Sat 4/5     1 tablets   2 tablets   1 tablet   1 tablets   1 tablet   1 tablets       Total  6 mg   Total  8 mg   Total  4 mg   Total  6 mg   Total  4 mg   Total  6 mg      3/31: Procedure Day - No enoxaparin - Resume warfarin in the evening or as directed by doctor (take an extra half tablet with usual dose for 2 days then resume normal dose).   4/1: Resume enoxaparin inject in the fatty tissue every 12 hours and take warfarin   4/2: Inject enoxaparin in the fatty tissue every 12 hours and take warfarin   4/3: Inject enoxaparin in the fatty tissue every 12 hours and take warfarin   4/4: Inject enoxaparin in the fatty tissue every 12 hours and take warfarin   4/5 and 4/6: Inject enoxaparin in the fatty tissue every 12 hours and take warfarin   4/7: warfarin appt to check INR.

## 2023-05-28 NOTE — Progress Notes (Signed)
  Echocardiogram 2D Echocardiogram has been performed.  Delcie Roch 05/28/2023, 8:21 AM

## 2023-05-28 NOTE — H&P (Signed)
 Patient arrived for pericardiocentesis.  Limited 2D echocardiogram performed in the short stay before taking him back for the procedure.  This demonstrates near resolution of his pericardial effusion with only a very small amount of fluid left at the subcostal border.  There is no circumferential fluid and no hemodynamic changes to suggest tamponade.  Procedure is canceled as there is not enough fluid to perform pericardiocentesis and it is no longer indicated that his effusion has nearly resolved.  Will provide instructions on warfarin/enoxaparin dosing.  Tonny Bollman 05/28/2023 9:02 AM

## 2023-06-02 ENCOUNTER — Other Ambulatory Visit: Payer: Self-pay | Admitting: Physician Assistant

## 2023-06-04 ENCOUNTER — Telehealth: Payer: Self-pay | Admitting: Cardiovascular Disease

## 2023-06-04 ENCOUNTER — Other Ambulatory Visit (HOSPITAL_COMMUNITY)

## 2023-06-04 ENCOUNTER — Ambulatory Visit: Attending: Physician Assistant | Admitting: *Deleted

## 2023-06-04 DIAGNOSIS — Z952 Presence of prosthetic heart valve: Secondary | ICD-10-CM | POA: Diagnosis not present

## 2023-06-04 DIAGNOSIS — Z7901 Long term (current) use of anticoagulants: Secondary | ICD-10-CM

## 2023-06-04 LAB — POCT INR: INR: 1.7 — AB (ref 2.0–3.0)

## 2023-06-04 MED ORDER — WARFARIN SODIUM 4 MG PO TABS: ORAL_TABLET | ORAL | 1 refills | Status: DC

## 2023-06-04 MED ORDER — ENOXAPARIN SODIUM 80 MG/0.8ML IJ SOSY: 80.0000 mg | PREFILLED_SYRINGE | Freq: Two times a day (BID) | INTRAMUSCULAR | 1 refills | Status: DC

## 2023-06-04 NOTE — Telephone Encounter (Signed)
 I spoke with patient. He did not want to come to [facility] to pick up from [facility] pharmacy. He is all out, so delivery isnt a good option. I advised he call some of the other pharmacies in town that he would be willing to go to and see if they had it. Gave him my phone number to call me when he finds one and I will send in rx

## 2023-06-04 NOTE — Telephone Encounter (Signed)
 Pt c/o medication issue:  1. Name of Medication: enoxaparin (LOVENOX) 80 MG/0.8ML injection   2. How are you currently taking this medication (dosage and times per day)? Inject 0.8 mLs (80 mg total) into the skin every 12 (twelve) hours.   3. Are you having a reaction (difficulty breathing--STAT)? No  4. What is your medication issue? Patient was prescribed this medication. [facility] and the medication is on back order. Patient would like to know if there is another option for him to be able to get this medication. Please advise.

## 2023-06-04 NOTE — Patient Instructions (Addendum)
 Take 2 1/2 tablets today then increase dose to 1.5 tablets daily Continue Lovenox twice daily till finished Recheck INR in 1 wk [facility] did not have [facility] injections in [facility].  Ordered and will be there tomorrow,  Follow above instructions.

## 2023-06-06 ENCOUNTER — Encounter (HOSPITAL_COMMUNITY): Payer: Self-pay

## 2023-06-06 ENCOUNTER — Telehealth: Payer: Self-pay | Admitting: Physician Assistant

## 2023-06-06 ENCOUNTER — Ambulatory Visit (HOSPITAL_COMMUNITY)
Admission: RE | Admit: 2023-06-06 | Discharge: 2023-06-06 | Disposition: A | Discharge status: Home / Self Care | Source: Ambulatory Visit | Attending: Physician Assistant | Admitting: Physician Assistant

## 2023-06-06 NOTE — Telephone Encounter (Signed)
 Patient identification verified by 2 forms. Marilynn Rail, RN    Called and spoke to patient  Patient states:   -does not understand why he needed to come in today   -Doctors appointments are becoming a lot for him   -he recalls discussing hand numbness at 3/28 OV but "its not a big deal"  Informed patient orders placed due to report of hand numbness and note of short segment of PICC being removed instead of entire line  Patient verbalized understanding but stated he wanted to cancel appointment  Patient had no further questions at this time

## 2023-06-06 NOTE — Telephone Encounter (Signed)
  Patient would like to know why he needs to undergo an upper extremity arterial duplex test.

## 2023-06-11 ENCOUNTER — Encounter

## 2023-06-12 ENCOUNTER — Encounter: Payer: Self-pay | Admitting: *Deleted

## 2023-06-12 ENCOUNTER — Encounter

## 2023-06-13 ENCOUNTER — Telehealth: Payer: Self-pay | Admitting: Orthopedic Surgery

## 2023-06-13 NOTE — Telephone Encounter (Signed)
 Called Dr. Louanna Rouse. [place] office to check on referral we had placed for patient. Spoke with Melissa and was advised that patient was contacted on March 6th about referral and he refused. Stated [place] far for [place], and he does not want to come here.  Informed Dr. Merlinda Starling of the refusal.

## 2023-06-14 ENCOUNTER — Encounter

## 2023-06-19 ENCOUNTER — Ambulatory Visit: Attending: Internal Medicine | Admitting: *Deleted

## 2023-06-19 DIAGNOSIS — Z952 Presence of prosthetic heart valve: Secondary | ICD-10-CM

## 2023-06-19 DIAGNOSIS — Z7901 Long term (current) use of anticoagulants: Secondary | ICD-10-CM

## 2023-06-19 LAB — POCT INR: INR: 3.6 — AB (ref 2.0–3.0)

## 2023-06-19 MED ORDER — WARFARIN SODIUM 4 MG PO TABS: ORAL_TABLET | ORAL | 1 refills | Status: DC

## 2023-06-19 NOTE — Patient Instructions (Signed)
 Pt states he has been taking warfarin 3 tablets daily since he did not have lovenox  for 2 weeks Start warfarin 3 tablets daily except 2 tablets on Tuesdays Recheck INR in 2 wk

## 2023-07-03 ENCOUNTER — Encounter

## 2023-07-05 ENCOUNTER — Ambulatory Visit: Admitting: Physician Assistant

## 2023-07-11 ENCOUNTER — Encounter: Payer: Self-pay | Admitting: Cardiovascular Disease

## 2023-09-12 ENCOUNTER — Encounter: Payer: Self-pay | Admitting: Cardiovascular Disease

## 2023-11-07 ENCOUNTER — Telehealth: Payer: Self-pay

## 2023-11-07 ENCOUNTER — Encounter: Payer: Self-pay | Admitting: Emergency Medicine

## 2023-11-07 ENCOUNTER — Ambulatory Visit

## 2023-11-07 ENCOUNTER — Ambulatory Visit (HOSPITAL_COMMUNITY)
Admission: RE | Admit: 2023-11-07 | Discharge: 2023-11-07 | Disposition: A | Discharge status: Home / Self Care | Source: Ambulatory Visit | Attending: Family Medicine | Admitting: Family Medicine

## 2023-11-07 ENCOUNTER — Ambulatory Visit
Admission: EM | Admit: 2023-11-07 | Discharge: 2023-11-07 | Disposition: A | Discharge status: Home / Self Care | Attending: Family Medicine | Admitting: Family Medicine

## 2023-11-07 DIAGNOSIS — M79631 Pain in right forearm: Secondary | ICD-10-CM

## 2023-11-07 DIAGNOSIS — W19XXXA Unspecified fall, initial encounter: Secondary | ICD-10-CM | POA: Diagnosis not present

## 2023-11-07 DIAGNOSIS — Z043 Encounter for examination and observation following other accident: Secondary | ICD-10-CM | POA: Insufficient documentation

## 2023-11-07 NOTE — ED Triage Notes (Signed)
 Clemens down a ladder from the third step on Friday.  Hit right forearm on ladder.   Right forearm swollen.  Previous surgery on that arm.  States has not taken any of his medications in 2 weeks.

## 2023-11-07 NOTE — Telephone Encounter (Signed)
 Informed pt that his x-ray of his arm was negative for fracture or dislocation. Pt asked about was there any infection. Informed pt x ray was negative for infection as well. Pt verabalized understanding. No questions.

## 2023-11-08 ENCOUNTER — Ambulatory Visit (HOSPITAL_COMMUNITY): Payer: Self-pay

## 2023-11-08 NOTE — ED Provider Notes (Signed)
 [facility]   [id number] 11/07/23 Arrival Time: 1417  ASSESSMENT & PLAN:  1. Right forearm pain    I have personally viewed and independently interpreted the imaging studies ordered this visit. R forearm: hardware intact; no fx appreciated.  OTC ibuprofen . May f/u as needed.  Reviewed expectations re: course of current medical issues. Questions answered. Outlined signs and symptoms indicating need for more acute intervention. Patient verbalized understanding. After Visit Summary given.  SUBJECTIVE: History from: patient. Thomas [name] is a 63 y.o. male who reports fall from ladder; third rung; hit R forearm on ladder; is painful with mild swelling. Previous surgery on R forearm.  Past Surgical History:  Procedure Laterality Date   AORTIC VALVE REPLACEMENT N/A 03/16/2023   Procedure: AORTIC VALVE REPLACEMENT (AVR) USING ON-X PROSTHETIC AORTIC HEART VALVE WITH ANATOMIC SEWING RING SIZE ;  Surgeon: Kerrin Elspeth BROCKS, MD;  Location: [facility] OR;  Service: Open Heart Surgery;  Laterality: N/A;   BONE BIOPSY Right 02/09/2023   Procedure: BONE BIOPSY;  Surgeon: Arlinda Buster, MD;  Location: [facility];  Service: Orthopedics;  Laterality: Right;   CORONARY ANGIOGRAPHY N/A 03/13/2023   Procedure: CORONARY ANGIOGRAPHY;  Surgeon: Anner Alm LELON, MD;  Location: [facility] INVASIVE CV LAB;  Service: Cardiovascular;  Laterality: N/A;   HARDWARE REMOVAL Right 02/09/2023   Procedure: RIGHT FOREARM HARDWARE REMOVAL;  Surgeon: Arlinda Buster, MD;  Location: [facility];  Service: Orthopedics;  Laterality: Right;   IR THORACENTESIS ASP PLEURAL SPACE W/IMG GUIDE  03/23/2023   IR THORACENTESIS ASP PLEURAL SPACE W/IMG GUIDE  04/06/2023   IRRIGATION AND DEBRIDEMENT ELBOW Right 02/09/2023   Procedure: RIGHT FOREARM IRRIGATION AND DEBRIDEMENT;  Surgeon: Arlinda Buster, MD;  Location: [facility];  Service: Orthopedics;  Laterality: Right;   MITRAL  VALVE REPLACEMENT N/A 03/16/2023   Procedure: MITRAL VALVE (MV) REPLACEMENT USING ON-X PROSTHETIC MITRAL HEART VALVE WITH STANDARD SEWING RING SIZE 31/33MM;  Surgeon: Kerrin Elspeth BROCKS, MD;  Location: [facility] OR;  Service: Open Heart Surgery;  Laterality: N/A;   ORIF ULNAR FRACTURE Right 02/27/2023   Procedure: RIGHT OPEN REDUCTION INTERNAL FIXATION (ORIF) ULNAR FRACTURE;  Surgeon: Arlinda Buster, MD;  Location: [facility];  Service: Orthopedics;  Laterality: Right;   renalectomy Left    TEE WITHOUT CARDIOVERSION N/A 03/16/2023   Procedure: TRANSESOPHAGEAL ECHOCARDIOGRAM (TEE);  Surgeon: Kerrin Elspeth BROCKS, MD;  Location: [facility] OR;  Service: Open Heart Surgery;  Laterality: N/A;   TRANSESOPHAGEAL ECHOCARDIOGRAM (CATH LAB) N/A 03/12/2023   Procedure: TRANSESOPHAGEAL ECHOCARDIOGRAM;  Surgeon: Barbaraann Darryle Ned, MD;  Location: [facility] INVASIVE CV LAB;  Service: Cardiovascular;  Laterality: N/A;   WRIST OSTEOTOMY Right 10/24/2022   Procedure: RIGHT WRIST ULNAR SHORTENING OSTEOTOMY;  Surgeon: Arlinda Buster, MD;  Location: [facility];  Service: Orthopedics;  Laterality: Right;  regional block      OBJECTIVE:  Vitals:   11/07/23 1429  BP: (!) 152/69  Pulse: 96  Resp: 18  Temp: 98.9 F (37.2 C)  TempSrc: Oral  SpO2: 96%    General appearance: alert; no distress HEENT: [place]; AT Neck: supple with FROM Resp: unlabored respirations Extremities: R forearm: warm with well perfused appearance; fairly well localized moderate tenderness over right mid forearm; without gross deformities; swelling: none; bruising: none; elbow and wrist ROM: normal Skin: warm and dry; no visible rashes Psychological: alert and cooperative; normal mood and affect  Imaging: DG Forearm Right Result Date: 11/07/2023 CLINICAL DATA:  pain s/p fall. EXAM: RIGHT FOREARM -  2 VIEW COMPARISON:  04/30/2023. FINDINGS: No acute fracture or dislocation. No aggressive osseous lesion. Distal right ulnar  ORIF noted, without significant interval change since the prior study. Mild diffuse degenerative changes of imaged joints. No radiopaque foreign bodies. Soft tissues are within normal limits. IMPRESSION: No acute osseous abnormality of the right forearm. Electronically Signed   By: Ree Molt M.D.   On: 11/07/2023 15:33      Allergies  Allergen Reactions   Hydrocodone  Rash    Past Medical History:  Diagnosis Date   Blood dyscrasia    GERD (gastroesophageal reflux disease)    History of kidney stones    Hypertension    Thrombocytopenia (HCC) 04/12/2016   Social History   Socioeconomic History   Marital status: Single    Spouse name: Not on file   Number of children: Not on file   Years of education: Not on file   Highest education level: Not on file  Occupational History   Not on file  Tobacco Use   Smoking status: Never   Smokeless tobacco: Never  Substance and Sexual Activity   Alcohol use: Yes    Comment: occasionally   Drug use: No   Sexual activity: Yes  Other Topics Concern   Not on file  Social History Narrative   Not on file   Social Drivers of Health   Financial Resource Strain: Not on file  Food Insecurity: No Food Insecurity (03/10/2023)   Hunger Vital Sign    Worried About Running Out of Food in the Last Year: Never true    Ran Out of Food in the Last Year: Never true  Transportation Needs: No Transportation Needs (03/10/2023)   PRAPARE - Administrator, Civil Service (Medical): No    Lack of Transportation (Non-Medical): No  Physical Activity: Not on file  Stress: Not on file  Social Connections: Socially Integrated (03/10/2023)   Social Connection and Isolation Panel    Frequency of Communication with Friends and Family: Twice a week    Frequency of Social Gatherings with Friends and Family: Twice a week    Attends Religious Services: 1 to 4 times per year    Active Member of [employer] or Organizations: Yes    Attends Museum/gallery exhibitions officer: More than 4 times per year    Marital Status: Living with partner   Family History  Problem Relation Age of Onset   Healthy Mother    Cancer Father    Past Surgical History:  Procedure Laterality Date   AORTIC VALVE REPLACEMENT N/A 03/16/2023   Procedure: AORTIC VALVE REPLACEMENT (AVR) USING ON-X PROSTHETIC AORTIC HEART VALVE WITH ANATOMIC SEWING RING SIZE ;  Surgeon: Kerrin Elspeth BROCKS, MD;  Location: [facility] OR;  Service: Open Heart Surgery;  Laterality: N/A;   BONE BIOPSY Right 02/09/2023   Procedure: BONE BIOPSY;  Surgeon: Arlinda Buster, MD;  Location: [facility];  Service: Orthopedics;  Laterality: Right;   CORONARY ANGIOGRAPHY N/A 03/13/2023   Procedure: CORONARY ANGIOGRAPHY;  Surgeon: Anner Alm ORN, MD;  Location: [facility] INVASIVE CV LAB;  Service: Cardiovascular;  Laterality: N/A;   HARDWARE REMOVAL Right 02/09/2023   Procedure: RIGHT FOREARM HARDWARE REMOVAL;  Surgeon: Arlinda Buster, MD;  Location: [facility];  Service: Orthopedics;  Laterality: Right;   IR THORACENTESIS ASP PLEURAL SPACE W/IMG GUIDE  03/23/2023   IR THORACENTESIS ASP PLEURAL SPACE W/IMG GUIDE  04/06/2023   IRRIGATION AND DEBRIDEMENT ELBOW Right 02/09/2023   Procedure: RIGHT  FOREARM IRRIGATION AND DEBRIDEMENT;  Surgeon: Arlinda Buster, MD;  Location: [facility];  Service: Orthopedics;  Laterality: Right;   MITRAL VALVE REPLACEMENT N/A 03/16/2023   Procedure: MITRAL VALVE (MV) REPLACEMENT USING ON-X PROSTHETIC MITRAL HEART VALVE WITH STANDARD SEWING RING SIZE 31/33MM;  Surgeon: Kerrin Elspeth BROCKS, MD;  Location[facility] OR;  Service: Open Heart Surgery;  Laterality: N/A;   ORIF ULNAR FRACTURE Right 02/27/2023   Procedure: RIGHT OPEN REDUCTION INTERNAL FIXATION (ORIF) ULNAR FRACTURE;  Surgeon: Arlinda Buster, MD;  Location: [facility];  Service: Orthopedics;  Laterality: Right;   renalectomy Left    TEE WITHOUT CARDIOVERSION N/A  03/16/2023   Procedure: TRANSESOPHAGEAL ECHOCARDIOGRAM (TEE);  Surgeon: Kerrin Elspeth BROCKS, MD;  Location: [facility] OR;  Service: Open Heart Surgery;  Laterality: N/A;   TRANSESOPHAGEAL ECHOCARDIOGRAM (CATH LAB) N/A 03/12/2023   Procedure: TRANSESOPHAGEAL ECHOCARDIOGRAM;  Surgeon: Barbaraann Darryle Ned, MD;  Location: [facility] INVASIVE CV LAB;  Service: Cardiovascular;  Laterality: N/A;   WRIST OSTEOTOMY Right 10/24/2022   Procedure: RIGHT WRIST ULNAR SHORTENING OSTEOTOMY;  Surgeon: Arlinda Buster, MD;  Location: [facility];  Service: Orthopedics;  Laterality: Right;  regional block       Rolinda Rogue, MD 11/08/23 1229

## 2023-11-14 ENCOUNTER — Encounter (HOSPITAL_BASED_OUTPATIENT_CLINIC_OR_DEPARTMENT_OTHER): Payer: Self-pay

## 2023-11-16 ENCOUNTER — Ambulatory Visit: Attending: Physician Assistant | Admitting: Physician Assistant

## 2023-11-16 NOTE — Progress Notes (Signed)
 This encounter was created in error - please disregard.

## 2023-11-26 ENCOUNTER — Telehealth: Payer: Self-pay | Admitting: Physician Assistant

## 2023-11-26 NOTE — Telephone Encounter (Signed)
 Spoke with pt. Pt denies chest pain or having any symptoms.  Pt is having all over itching and acid reflux.  Pt is requesting Hao Meng prescribe something for him. Advised pt that would need to come from his PCP. Pt requested the request to go to [place]. Told pt I would send that over for him. Pt stated understanding.

## 2023-11-26 NOTE — Telephone Encounter (Signed)
 Pt c/o of Chest Pain: STAT if active (IN THIS MOMENT) CP, including tightness, pressure, jaw pain, shoulder/upper arm/back pain, SOB, nausea, and vomiting.  1. Are you having CP right now (tightness, pressure, or discomfort)?  Yes   2. Are you experiencing any other symptoms (ex. SOB, nausea, vomiting, sweating)?  No   3. How long have you been experiencing CP?  4 days   4. Is your CP continuous or coming and going?  Continuous   5. Have you taken Nitroglycerin ?  No

## 2023-11-26 NOTE — Telephone Encounter (Signed)
 Acid reflux medication such as prilosec or pepcid  is over the counter. But I am not sure what is his exact symptom. He missed his recent cardiology office visit. If symptom worsen, would recommend office evaluation.

## 2023-11-28 ENCOUNTER — Other Ambulatory Visit: Payer: Self-pay

## 2023-11-28 ENCOUNTER — Encounter (HOSPITAL_COMMUNITY): Payer: Self-pay

## 2023-11-28 ENCOUNTER — Observation Stay (HOSPITAL_COMMUNITY)
Admission: EM | Admit: 2023-11-28 | Discharge: 2023-11-30 | Disposition: A | Discharge status: Home / Self Care | Attending: Emergency Medicine | Admitting: Emergency Medicine

## 2023-11-28 DIAGNOSIS — K922 Gastrointestinal hemorrhage, unspecified: Secondary | ICD-10-CM

## 2023-11-28 DIAGNOSIS — I10 Essential (primary) hypertension: Secondary | ICD-10-CM | POA: Diagnosis not present

## 2023-11-28 DIAGNOSIS — J069 Acute upper respiratory infection, unspecified: Secondary | ICD-10-CM | POA: Insufficient documentation

## 2023-11-28 DIAGNOSIS — K219 Gastro-esophageal reflux disease without esophagitis: Secondary | ICD-10-CM | POA: Diagnosis not present

## 2023-11-28 DIAGNOSIS — K2970 Gastritis, unspecified, without bleeding: Secondary | ICD-10-CM | POA: Diagnosis not present

## 2023-11-28 DIAGNOSIS — K648 Other hemorrhoids: Secondary | ICD-10-CM | POA: Insufficient documentation

## 2023-11-28 DIAGNOSIS — Z79899 Other long term (current) drug therapy: Secondary | ICD-10-CM | POA: Insufficient documentation

## 2023-11-28 DIAGNOSIS — D649 Anemia, unspecified: Secondary | ICD-10-CM | POA: Diagnosis present

## 2023-11-28 DIAGNOSIS — Z7901 Long term (current) use of anticoagulants: Secondary | ICD-10-CM | POA: Insufficient documentation

## 2023-11-28 DIAGNOSIS — R7401 Elevation of levels of liver transaminase levels: Secondary | ICD-10-CM | POA: Diagnosis not present

## 2023-11-28 DIAGNOSIS — I110 Hypertensive heart disease with heart failure: Secondary | ICD-10-CM | POA: Insufficient documentation

## 2023-11-28 DIAGNOSIS — D123 Benign neoplasm of transverse colon: Secondary | ICD-10-CM | POA: Insufficient documentation

## 2023-11-28 DIAGNOSIS — D122 Benign neoplasm of ascending colon: Secondary | ICD-10-CM | POA: Diagnosis not present

## 2023-11-28 DIAGNOSIS — R791 Abnormal coagulation profile: Secondary | ICD-10-CM

## 2023-11-28 DIAGNOSIS — Z952 Presence of prosthetic heart valve: Secondary | ICD-10-CM | POA: Diagnosis not present

## 2023-11-28 DIAGNOSIS — I5032 Chronic diastolic (congestive) heart failure: Secondary | ICD-10-CM | POA: Insufficient documentation

## 2023-11-28 DIAGNOSIS — D500 Iron deficiency anemia secondary to blood loss (chronic): Principal | ICD-10-CM

## 2023-11-28 DIAGNOSIS — F1010 Alcohol abuse, uncomplicated: Secondary | ICD-10-CM | POA: Insufficient documentation

## 2023-11-28 DIAGNOSIS — D62 Acute posthemorrhagic anemia: Secondary | ICD-10-CM | POA: Diagnosis present

## 2023-11-28 HISTORY — DX: Acquired absence of kidney: Z90.5

## 2023-11-28 HISTORY — DX: Alcohol abuse, in remission: F10.11

## 2023-11-28 HISTORY — DX: Acute and subacute infective endocarditis: I33.0

## 2023-11-28 LAB — CBC WITH DIFFERENTIAL/PLATELET
Abs Immature Granulocytes: 0 K/uL (ref 0.00–0.07)
Basophils Absolute: 0 K/uL (ref 0.0–0.1)
Basophils Relative: 1 %
Eosinophils Absolute: 0.2 K/uL (ref 0.0–0.5)
Eosinophils Relative: 5 %
HCT: 25.6 % — ABNORMAL LOW (ref 39.0–52.0)
Hemoglobin: 6.8 g/dL — CL (ref 13.0–17.0)
Immature Granulocytes: 0 %
Lymphocytes Relative: 33 %
Lymphs Abs: 1.2 K/uL (ref 0.7–4.0)
MCH: 19.9 pg — ABNORMAL LOW (ref 26.0–34.0)
MCHC: 26.6 g/dL — ABNORMAL LOW (ref 30.0–36.0)
MCV: 74.9 fL — ABNORMAL LOW (ref 80.0–100.0)
Monocytes Absolute: 0.7 K/uL (ref 0.1–1.0)
Monocytes Relative: 20 %
Neutro Abs: 1.5 K/uL — ABNORMAL LOW (ref 1.7–7.7)
Neutrophils Relative %: 41 %
Platelets: 171 K/uL (ref 150–400)
RBC: 3.42 MIL/uL — ABNORMAL LOW (ref 4.22–5.81)
RDW: 20.4 % — ABNORMAL HIGH (ref 11.5–15.5)
WBC: 3.5 K/uL — ABNORMAL LOW (ref 4.0–10.5)
nRBC: 0 % (ref 0.0–0.2)

## 2023-11-28 LAB — PROTIME-INR
INR: 1.2 (ref 0.8–1.2)
Prothrombin Time: 16.2 s — ABNORMAL HIGH (ref 11.4–15.2)

## 2023-11-28 LAB — IRON AND TIBC
Iron: 22 ug/dL — ABNORMAL LOW (ref 45–182)
Saturation Ratios: 4 % — ABNORMAL LOW (ref 17.9–39.5)
TIBC: 507 ug/dL — ABNORMAL HIGH (ref 250–450)
UIBC: 485 ug/dL

## 2023-11-28 LAB — POC OCCULT BLOOD, ED: Fecal Occult Blood: POSITIVE — AB

## 2023-11-28 LAB — COMPREHENSIVE METABOLIC PANEL WITH GFR
ALT: 47 U/L — ABNORMAL HIGH (ref 0–44)
AST: 73 U/L — ABNORMAL HIGH (ref 15–41)
Albumin: 3.5 g/dL (ref 3.5–5.0)
Alkaline Phosphatase: 107 U/L (ref 38–126)
Anion gap: 10 (ref 5–15)
BUN: 7 mg/dL — ABNORMAL LOW (ref 8–23)
CO2: 21 mmol/L — ABNORMAL LOW (ref 22–32)
Calcium: 8.6 mg/dL — ABNORMAL LOW (ref 8.9–10.3)
Chloride: 107 mmol/L (ref 98–111)
Creatinine, Ser: 0.86 mg/dL (ref 0.61–1.24)
GFR, Estimated: >60 mL/min (ref >60)
Glucose, Bld: 89 mg/dL (ref 70–99)
Potassium: 3.9 mmol/L (ref 3.5–5.1)
Sodium: 138 mmol/L (ref 135–145)
Total Bilirubin: 1.1 mg/dL (ref 0.0–1.2)
Total Protein: 7.7 g/dL (ref 6.5–8.1)

## 2023-11-28 LAB — FOLATE: Folate: 6.8 ng/mL (ref >5.9)

## 2023-11-28 LAB — RETICULOCYTES
Immature Retic Fract: 19.5 % — ABNORMAL HIGH (ref 2.3–15.9)
RBC.: 3.29 MIL/uL — ABNORMAL LOW (ref 4.22–5.81)
Retic Count, Absolute: 39.5 K/uL (ref 19.0–186.0)
Retic Ct Pct: 1.2 % (ref 0.4–3.1)

## 2023-11-28 LAB — VITAMIN B12: Vitamin B-12: 309 pg/mL (ref 180–914)

## 2023-11-28 LAB — PREPARE RBC (CROSSMATCH)

## 2023-11-28 LAB — FERRITIN: Ferritin: 24 ng/mL (ref 24–336)

## 2023-11-28 LAB — MAGNESIUM: Magnesium: 2 mg/dL (ref 1.7–2.4)

## 2023-11-28 MED ORDER — SODIUM CHLORIDE 0.9% IV SOLUTION: Freq: Once | INTRAVENOUS | Status: AC

## 2023-11-28 MED ORDER — DIPHENHYDRAMINE HCL 25 MG PO CAPS: 25.0000 mg | ORAL_CAPSULE | Freq: Every evening | ORAL | Status: DC | PRN

## 2023-11-28 MED ORDER — DIPHENHYDRAMINE HCL 25 MG PO CAPS
25.0000 mg | ORAL_CAPSULE | Freq: Every evening | ORAL | Status: DC | PRN
  Administered 2023-11-28: 25 mg via ORAL
  Filled 2023-11-28: qty 1

## 2023-11-28 MED ORDER — ALPRAZOLAM 1 MG PO TABS
2.0000 mg | ORAL_TABLET | Freq: Every evening | ORAL | Status: DC | PRN
  Administered 2023-11-28 – 2023-11-29 (×2): 2 mg via ORAL
  Filled 2023-11-28: qty 2
  Filled 2023-11-28: qty 4

## 2023-11-28 MED ORDER — PANTOPRAZOLE SODIUM 40 MG IV SOLR
40.0000 mg | Freq: Once | INTRAVENOUS | Status: AC
  Administered 2023-11-28: 40 mg via INTRAVENOUS
  Filled 2023-11-28: qty 10

## 2023-11-28 NOTE — ED Triage Notes (Signed)
 Pt arrived via POV c/o itching all over and reports last night he received a phone call advising him to come to the ER for evaluation of Hgb of 7.4. Pt does endorse intermittent SOB and CP.

## 2023-11-28 NOTE — ED Notes (Signed)
 Poc occult blood complete by provider, per provider results were positive.

## 2023-11-28 NOTE — Discharge Instructions (Signed)
 Follow-up with your family doctor next week for recheck.  Also follow-up with the GI doctor next week

## 2023-11-28 NOTE — ED Notes (Signed)
 Pt in bed, pt denies itchiness, pt denies sob or feeling like his throat is closing, resps even and unlabored, pt appears in no acute distress, no rash noted.

## 2023-11-28 NOTE — ED Provider Notes (Signed)
 [place] EMERGENCY DEPARTMENT AT [facility] Provider Note   CSN: [id number] Arrival date & time: 11/28/23  1019     Patient presents with: low hemoglobin   Thomas [name] is a 63 y.o. male.   Patient has a history of anemia, mitral and aortic valve replacement.  On Coumadin  but has not been taking it recently and GERD.  He saw his doctor recently and they checked his hemoglobin and it was low.  He was told to come to the emergency department. no history of any black stools  The history is provided by the patient and medical records. No language interpreter was used.  Weakness Severity:  Mild Onset quality:  Sudden Timing:  Intermittent Progression:  Resolved Chronicity:  New Context: not alcohol use   Relieved by:  Nothing Worsened by:  Nothing Ineffective treatments:  None tried Associated symptoms: no abdominal pain, no chest pain, no cough, no diarrhea, no frequency, no headaches and no seizures        Prior to Admission medications   Medication Sig Start Date End Date Taking? Authorizing Provider  acetaminophen  (TYLENOL ) 325 MG tablet Take 2 tablets (650 mg total) by mouth every 4 (four) hours as needed for mild pain (pain score 1-3) or fever. 03/26/23   Gold, Wayne E, PA-C  ALPRAZolam  (XANAX ) 1 MG tablet Take 1 mg by mouth at bedtime as needed for anxiety.    [provider]  atorvastatin  (LIPITOR) 40 MG tablet TAKE 1 TABLET BY MOUTH EVERY DAY 04/15/23   Kerrin Elspeth BROCKS, MD  enoxaparin  (LOVENOX ) 80 MG/0.8ML injection Inject 0.8 mLs (80 mg total) into the skin every 12 (twelve) hours. 06/04/23   Meng, Hao, PA  magnesium  oxide (MAG-OX) 400 (240 Mg) MG tablet Take 1 tablet (400 mg total) by mouth 2 (two) times daily. 03/26/23   Gold, Wayne E, PA-C  omeprazole (PRILOSEC OTC) 20 MG tablet Take 20 mg by mouth daily.    [provider]  potassium chloride  SA (KLOR-CON  M) 20 MEQ tablet TAKE 1 TABLET BY MOUTH TWICE A DAY 06/04/23   Wonda Sharper,  MD  torsemide  (DEMADEX ) 20 MG tablet TAKE 2 TABS BY MOUTH IN THE MORNING AND 1 TABLET AT NIGHT Patient taking differently: Take 20 mg by mouth 2 (two) times daily. 05/08/23   Meng, Hao, PA  warfarin (COUMADIN ) 4 MG tablet Take 2-3 tablets Daily or as prescribed by Coumadin  Clinic. 06/19/23   Meng, Hao, PA    Allergies: Hydrocodone     Review of Systems  Constitutional:  Negative for appetite change and fatigue.  HENT:  Negative for congestion, ear discharge and sinus pressure.   Eyes:  Negative for discharge.  Respiratory:  Negative for cough.   Cardiovascular:  Negative for chest pain.  Gastrointestinal:  Negative for abdominal pain and diarrhea.  Genitourinary:  Negative for frequency and hematuria.  Musculoskeletal:  Negative for back pain.  Skin:  Negative for rash.  Neurological:  Positive for weakness. Negative for seizures and headaches.  Psychiatric/Behavioral:  Negative for hallucinations.     Updated Vital Signs BP 132/67   Pulse 82   Temp 98.1 F (36.7 C) (Oral)   Resp 18   Ht 5' 11 (1.803 m)   Wt 90.3 kg   SpO2 100%   BMI 27.75 kg/m   Physical Exam Vitals and nursing note reviewed.  Constitutional:      Appearance: He is well-developed.  HENT:     Head: Normocephalic.  Right Ear: Tympanic membrane normal.     Nose: Nose normal.  Eyes:     General: No scleral icterus.    Conjunctiva/sclera: Conjunctivae normal.  Neck:     Thyroid: No thyromegaly.  Cardiovascular:     Rate and Rhythm: Normal rate and regular rhythm.     Heart sounds: No murmur heard.    No friction rub. No gallop.  Pulmonary:     Breath sounds: No stridor. No wheezing or rales.  Chest:     Chest wall: No tenderness.  Abdominal:     General: There is no distension.     Tenderness: There is no abdominal tenderness. There is no rebound.  Genitourinary:    Comments: Brown heme positive stool Musculoskeletal:        General: Normal range of motion.     Cervical back: Neck supple.   Lymphadenopathy:     Cervical: No cervical adenopathy.  Skin:    Findings: No erythema or rash.  Neurological:     Mental Status: He is alert and oriented to person, place, and time.     Motor: No abnormal muscle tone.     Coordination: Coordination normal.  Psychiatric:        Behavior: Behavior normal.     (all labs ordered are listed, but only abnormal results are displayed) Labs Reviewed  CBC WITH DIFFERENTIAL/PLATELET - Abnormal; Notable for the following components:      Result Value   WBC 3.5 (*)    RBC 3.42 (*)    Hemoglobin 6.8 (*)    HCT 25.6 (*)    MCV 74.9 (*)    MCH 19.9 (*)    MCHC 26.6 (*)    RDW 20.4 (*)    Neutro Abs 1.5 (*)    All other components within normal limits  COMPREHENSIVE METABOLIC PANEL WITH GFR - Abnormal; Notable for the following components:   CO2 21 (*)    BUN 7 (*)    Calcium  8.6 (*)    AST 73 (*)    ALT 47 (*)    All other components within normal limits  PROTIME-INR - Abnormal; Notable for the following components:   Prothrombin Time 16.2 (*)    All other components within normal limits  IRON AND TIBC - Abnormal; Notable for the following components:   Iron 22 (*)    TIBC 507 (*)    Saturation Ratios 4 (*)    All other components within normal limits  RETICULOCYTES - Abnormal; Notable for the following components:   RBC. 3.29 (*)    Immature Retic Fract 19.5 (*)    All other components within normal limits  VITAMIN B12  FOLATE  FERRITIN  CBC WITH DIFFERENTIAL/PLATELET  POC OCCULT BLOOD, ED  TYPE AND SCREEN  PREPARE RBC (CROSSMATCH)    EKG: None  Radiology: No results found.   Procedures   Medications Ordered in the ED  0.9 %  sodium chloride  infusion (Manually program via Guardrails IV Fluids) ( Intravenous New Bag/Given 11/28/23 1440)   Patient with anemia and hemoglobin 6.8.  Also heme positive brown stool.  Patient refused admission to the hospital.  He will be transfused 1 unit of blood and referred to GI and  will be followed up with his family doctor also                                 Medical Decision Making  Amount and/or Complexity of Data Reviewed Labs: ordered.  Risk Prescription drug management. Decision regarding hospitalization.   Anemia with probable GI bleed patient refusing admission to the hospital.  Patient is given 1 unit of packed red blood cells and started on Protonix  and will follow-up with GI and his family doctor     Final diagnoses:  None    ED Discharge Orders     None          Suzette Pac, MD 11/28/23 1724

## 2023-11-28 NOTE — H&P (Signed)
 History and Physical    Thomas [name] [facility] FMW:[id number] DOB: [birth year] DOA: 11/28/2023  PCP: Marvine Rush, MD   Patient coming from: Home  I have personally briefly reviewed patient's old medical records in [facility] Health Link  Chief Complaint: Low hemoglobin  HPI: Thomas [name] is a 63 y.o. male with medical history significant for diastolic CHF, alcohol abuse, pancytopenia, bacterial endocarditis, pericardial effusion, status post aortic and mitral valve repair. Patient went to see his outpatient provider with complaints of generalized itching over the past 2 months, without obvious rash, and reflux symptoms.  Reports his reflux symptoms have been present all his life, with burning to his mid chest and Prilosec no longer helps.  Denies new creams or lotions.  Reports some difficulty breathing.  No lower extremity swelling, no black stools, no vomiting.  He is supposed to be on warfarin he has not taken it in about a month. He went to see his PCP and work was done and showed hemoglobin of 7.4, he was referred to the ED.  ED Course: Temperature 98.  Heart rate 80s, respiratory rate 18-21.  Blood pressure systolic 130s to 849d.  O2 sats greater than 98% on room air. Hemoglobin 6.8.  Anemia panel suggesting iron deficiency anemia with low low serum iron, and increased TIBC.  Also low ferritin of 24. Monitor PRBC given.  Initially patient did not want to stay for admission, but when he was told his FOBT came back positive for blood, he decided to stay.  Review of Systems: As per HPI all other systems reviewed and negative.  Past Medical History:  Diagnosis Date   Blood dyscrasia    GERD (gastroesophageal reflux disease)    History of kidney stones    Hypertension    Thrombocytopenia 04/12/2016    Past Surgical History:  Procedure Laterality Date   AORTIC VALVE REPLACEMENT N/A 03/16/2023   Procedure: AORTIC VALVE REPLACEMENT (AVR) USING ON-X PROSTHETIC AORTIC HEART VALVE WITH  ANATOMIC SEWING RING SIZE ;  Surgeon: Kerrin Elspeth BROCKS, MD;  Location: [facility] OR;  Service: Open Heart Surgery;  Laterality: N/A;   BONE BIOPSY Right 02/09/2023   Procedure: BONE BIOPSY;  Surgeon: Arlinda Buster, MD;  Location: [facility];  Service: Orthopedics;  Laterality: Right;   CORONARY ANGIOGRAPHY N/A 03/13/2023   Procedure: CORONARY ANGIOGRAPHY;  Surgeon: Anner Alm ORN, MD;  Location: [facility] INVASIVE CV LAB;  Service: Cardiovascular;  Laterality: N/A;   HARDWARE REMOVAL Right 02/09/2023   Procedure: RIGHT FOREARM HARDWARE REMOVAL;  Surgeon: Arlinda Buster, MD;  Location: [facility];  Service: Orthopedics;  Laterality: Right;   IR THORACENTESIS ASP PLEURAL SPACE W/IMG GUIDE  03/23/2023   IR THORACENTESIS ASP PLEURAL SPACE W/IMG GUIDE  04/06/2023   IRRIGATION AND DEBRIDEMENT ELBOW Right 02/09/2023   Procedure: RIGHT FOREARM IRRIGATION AND DEBRIDEMENT;  Surgeon: Arlinda Buster, MD;  Location: [facility];  Service: Orthopedics;  Laterality: Right;   MITRAL VALVE REPLACEMENT N/A 03/16/2023   Procedure: MITRAL VALVE (MV) REPLACEMENT USING ON-X PROSTHETIC MITRAL HEART VALVE WITH STANDARD SEWING RING SIZE 31/33MM;  Surgeon: Kerrin Elspeth BROCKS, MD;  Location: [facility] OR;  Service: Open Heart Surgery;  Laterality: N/A;   ORIF ULNAR FRACTURE Right 02/27/2023   Procedure: RIGHT OPEN REDUCTION INTERNAL FIXATION (ORIF) ULNAR FRACTURE;  Surgeon: Arlinda Buster, MD;  Location: [facility];  Service: Orthopedics;  Laterality: Right;   renalectomy Left    TEE WITHOUT CARDIOVERSION N/A 03/16/2023   Procedure: TRANSESOPHAGEAL ECHOCARDIOGRAM (TEE);  Surgeon: Kerrin Elspeth BROCKS, MD;  Location: [facility] OR;  Service: Open Heart Surgery;  Laterality: N/A;   TRANSESOPHAGEAL ECHOCARDIOGRAM (CATH LAB) N/A 03/12/2023   Procedure: TRANSESOPHAGEAL ECHOCARDIOGRAM;  Surgeon: Barbaraann Darryle Ned, MD;  Location: [facility] INVASIVE CV LAB;  Service: Cardiovascular;   Laterality: N/A;   WRIST OSTEOTOMY Right 10/24/2022   Procedure: RIGHT WRIST ULNAR SHORTENING OSTEOTOMY;  Surgeon: Arlinda Buster, MD;  Location: [facility];  Service: Orthopedics;  Laterality: Right;  regional block     reports that he has never smoked. He has never been exposed to tobacco smoke. He has never used smokeless tobacco. He reports current alcohol use. He reports that he does not use drugs.  Allergies  Allergen Reactions   Hydrocodone  Rash    Family History  Problem Relation Age of Onset   Healthy Mother    Cancer Father     Prior to Admission medications   Medication Sig Start Date End Date Taking? Authorizing Provider  acetaminophen  (TYLENOL ) 325 MG tablet Take 2 tablets (650 mg total) by mouth every 4 (four) hours as needed for mild pain (pain score 1-3) or fever. 03/26/23  Yes Gold, Wayne E, PA-C  ALPRAZolam  (XANAX ) 1 MG tablet Take 2 mg by mouth at bedtime.   Yes [provider]  Aspirin -Acetaminophen -Caffeine (GOODYS EXTRA STRENGTH PO) Take 1 Package by mouth daily as needed (pain).   Yes [provider]  magnesium  oxide (MAG-OX) 400 (240 Mg) MG tablet Take 1 tablet (400 mg total) by mouth 2 (two) times daily. 03/26/23  Yes Gold, Wayne E, PA-C  omeprazole (PRILOSEC OTC) 20 MG tablet Take 20 mg by mouth daily.   Yes [provider]  potassium chloride  SA (KLOR-CON  M) 20 MEQ tablet TAKE 1 TABLET BY MOUTH TWICE A DAY 06/04/23  Yes Wonda Sharper, MD    Physical Exam: Vitals:   11/28/23 1430 11/28/23 1435 11/28/23 1445 11/28/23 1451  BP: (!) 148/71 (!) 143/79 134/72 132/67  Pulse: 81 79 78 82  Resp:  18 19 18   Temp:  98.6 F (37 C)  98.1 F (36.7 C)  TempSrc:  Oral  Oral  SpO2: 98% 99% 100% 100%  Weight:      Height:        Constitutional: NAD, calm, comfortable Vitals:   11/28/23 1430 11/28/23 1435 11/28/23 1445 11/28/23 1451  BP: (!) 148/71 (!) 143/79 134/72 132/67  Pulse: 81 79 78 82  Resp:  18 19 18   Temp:   98.6 F (37 C)  98.1 F (36.7 C)  TempSrc:  Oral  Oral  SpO2: 98% 99% 100% 100%  Weight:      Height:       Eyes: PERRL, lids and conjunctivae normal ENMT: Mucous membranes are moist.   Neck: normal, supple, no masses, no thyromegaly Respiratory: clear to auscultation bilaterally, no wheezing, no crackles. Normal respiratory effort. No accessory muscle use.  Cardiovascular: Regular rate and rhythm, no murmurs / rubs / gallops.  Abdomen: no tenderness, no masses palpated. No hepatosplenomegaly.   Musculoskeletal: no clubbing / cyanosis. No joint deformity upper and lower extremities.  Skin: no rashes, lesions, ulcers. No induration Neurologic: No facial asymmetry, moves extremities spontaneously, speech fluent. Psychiatric: Normal judgment and insight. Alert and oriented x 3. Normal mood.   Labs on Admission: I have personally reviewed following labs and imaging studies  CBC: Recent Labs  Lab 11/28/23 1053  WBC 3.5*  NEUTROABS 1.5*  HGB 6.8*  HCT 25.6*  MCV 74.9*  PLT 171   Basic Metabolic Panel: Recent Labs  Lab 11/28/23 1053  NA 138  K 3.9  CL 107  CO2 21*  GLUCOSE 89  BUN 7*  CREATININE 0.86  CALCIUM  8.6*   GFR: Estimated Creatinine Clearance: 93.6 mL/min (by C-G formula based on SCr of 0.86 mg/dL). Liver Function Tests: Recent Labs  Lab 11/28/23 1053  AST 73*  ALT 47*  ALKPHOS 107  BILITOT 1.1  PROT 7.7  ALBUMIN  3.5   Coagulation Profile: Recent Labs  Lab 11/28/23 1053  INR 1.2   Anemia Panel: Recent Labs    11/28/23 1250  VITAMINB12 309  FOLATE 6.8  FERRITIN 24  TIBC 507*  IRON 22*  RETICCTPCT 1.2   Urine analysis:    Component Value Date/Time   COLORURINE YELLOW 01/08/2023 1720   APPEARANCEUR CLEAR 01/08/2023 1720   LABSPEC 1.015 01/08/2023 1720   PHURINE 5.0 01/08/2023 1720   GLUCOSEU NEGATIVE 01/08/2023 1720   HGBUR MODERATE (A) 01/08/2023 1720   BILIRUBINUR NEGATIVE 01/08/2023 1720   KETONESUR NEGATIVE 01/08/2023 1720    PROTEINUR NEGATIVE 01/08/2023 1720   UROBILINOGEN 0.2 02/27/2008 1111   NITRITE NEGATIVE 01/08/2023 1720   LEUKOCYTESUR TRACE (A) 01/08/2023 1720    Radiological Exams on Admission: No results found.  EKG: None  Assessment/Plan Principal Problem:   Symptomatic anemia Active Problems:   Alcohol abuse   Essential hypertension   S/P AVR (aortic valve replacement)   S/P MVR (mitral valve replacement)   Long term (current) use of anticoagulants   Assessment and Plan:  Acute on chronic symptomatic anemia-hemoglobin 6.8, baseline 7-8.  Stool FOBT positive, but no melena hematochezia or hematemesis.  Anemia panel suggestive of deficiency anemia.  Supposed to be on anticoagulation with warfarin but has not taken it in the past month. - EDP talked to GI, will see in consult in a.m., clear liquid diet n.p.o. midnight - IV Protonix  40 twice daily - 1 unit PRBC transfused - Trend CBC - Hold warfarin  Alcohol abuse-drinks 6-8 beers and a shot of liquor, but denies daily use.  Last drink 2 nights ago.  Denies history of alcohol withdrawal or the shakes. - CIWA as needed - Thiamine  folate multivitamins - Potassium 3.9, check magnesium   Hypertension-systolic 130s to 829d. Not on antihypertensives.   ASA, atrial valve replacement no long-term anticoagulation-reports he has not taken his warfarin in about a month, past medication, but he did not fill his pillbox.   - Holding warfarin for now in the setting of acute on chronic anemia  Chronic diastolic CHF.  Stable and compensated.  Not on diuretics.  Last echo 04/2023-EF of 70 to 75%.  DVT prophylaxis: SCDS Code Status: Full code Family Communication: None at bedside Disposition Plan: ~ 2 days Consults called: GI Admission status: Obs tele    Author: Tully FORBES Carwin, MD 11/28/2023 7:10 PM  For on call review [email or web address].

## 2023-11-28 NOTE — ED Provider Notes (Signed)
 Pt signed out by Dr. Suzette pending blood transfusion.  He initially said he did not want to be admitted.  However, he was unaware he had blood in his stool.  He said he's willing to stay now for further eval.  He's never seen GI or had an endoscopy/colonoscopy.  Hgb 7.4 yesterday at his doctor's office.  Today, it's 6.8.  Pt d/w Dr. Shaaron (GI) and he recommended PPI, clear liquids until midnight, then NPO after midnight.  He will see pt in the am.  Pt given 1 unit of blood in the ED.  Pt d/w Dr. Pearlean who will admit patient.    CRITICAL CARE Performed by: Mliss Boyers   Total critical care time: 30 minutes  Critical care time was exclusive of separately billable procedures and treating other patients.  Critical care was necessary to treat or prevent imminent or life-threatening deterioration.  Critical care was time spent personally by me on the following activities: development of treatment plan with patient and/or surrogate as well as nursing, discussions with consultants, evaluation of patient's response to treatment, examination of patient, obtaining history from patient or surrogate, ordering and performing treatments and interventions, ordering and review of laboratory studies, ordering and review of radiographic studies, pulse oximetry and re-evaluation of patient's condition.    Boyers Mliss, MD 11/28/23 [phone]

## 2023-11-29 ENCOUNTER — Encounter (HOSPITAL_COMMUNITY): Payer: Self-pay | Admitting: Internal Medicine

## 2023-11-29 ENCOUNTER — Inpatient Hospital Stay (HOSPITAL_COMMUNITY)

## 2023-11-29 ENCOUNTER — Other Ambulatory Visit (HOSPITAL_COMMUNITY): Payer: Self-pay | Admitting: *Deleted

## 2023-11-29 DIAGNOSIS — D509 Iron deficiency anemia, unspecified: Secondary | ICD-10-CM

## 2023-11-29 DIAGNOSIS — Z01810 Encounter for preprocedural cardiovascular examination: Secondary | ICD-10-CM

## 2023-11-29 DIAGNOSIS — K769 Liver disease, unspecified: Secondary | ICD-10-CM | POA: Diagnosis not present

## 2023-11-29 DIAGNOSIS — Z952 Presence of prosthetic heart valve: Secondary | ICD-10-CM | POA: Diagnosis not present

## 2023-11-29 DIAGNOSIS — K922 Gastrointestinal hemorrhage, unspecified: Secondary | ICD-10-CM | POA: Diagnosis not present

## 2023-11-29 DIAGNOSIS — D500 Iron deficiency anemia secondary to blood loss (chronic): Secondary | ICD-10-CM

## 2023-11-29 DIAGNOSIS — K219 Gastro-esophageal reflux disease without esophagitis: Secondary | ICD-10-CM | POA: Diagnosis not present

## 2023-11-29 DIAGNOSIS — D649 Anemia, unspecified: Secondary | ICD-10-CM | POA: Diagnosis not present

## 2023-11-29 DIAGNOSIS — I2510 Atherosclerotic heart disease of native coronary artery without angina pectoris: Secondary | ICD-10-CM | POA: Diagnosis not present

## 2023-11-29 DIAGNOSIS — R7401 Elevation of levels of liver transaminase levels: Secondary | ICD-10-CM

## 2023-11-29 LAB — ECHOCARDIOGRAM COMPLETE
AR max vel: 0.9 cm2
AV Area VTI: 0.93 cm2
AV Area mean vel: 0.87 cm2
AV Mean grad: 17 mmHg
AV Peak grad: 31.1 mmHg
Ao pk vel: 2.79 m/s
Area-P 1/2: 2.29 cm2
Height: 71 in
MV VTI: 1.19 cm2
S' Lateral: 2.3 cm
Weight: 3184 [oz_av]

## 2023-11-29 LAB — BASIC METABOLIC PANEL WITH GFR
Anion gap: 8 (ref 5–15)
BUN: 6 mg/dL — ABNORMAL LOW (ref 8–23)
CO2: 21 mmol/L — ABNORMAL LOW (ref 22–32)
Calcium: 8.4 mg/dL — ABNORMAL LOW (ref 8.9–10.3)
Chloride: 109 mmol/L (ref 98–111)
Creatinine, Ser: 0.76 mg/dL (ref 0.61–1.24)
GFR, Estimated: >60 mL/min (ref >60)
Glucose, Bld: 91 mg/dL (ref 70–99)
Potassium: 4 mmol/L (ref 3.5–5.1)
Sodium: 137 mmol/L (ref 135–145)

## 2023-11-29 LAB — TYPE AND SCREEN
ABO/RH(D): O NEG
Antibody Screen: NEGATIVE
Unit division: 0

## 2023-11-29 LAB — CBC
HCT: 26.5 % — ABNORMAL LOW (ref 39.0–52.0)
Hemoglobin: 7.3 g/dL — ABNORMAL LOW (ref 13.0–17.0)
MCH: 20.8 pg — ABNORMAL LOW (ref 26.0–34.0)
MCHC: 27.5 g/dL — ABNORMAL LOW (ref 30.0–36.0)
MCV: 75.5 fL — ABNORMAL LOW (ref 80.0–100.0)
Platelets: 159 K/uL (ref 150–400)
RBC: 3.51 MIL/uL — ABNORMAL LOW (ref 4.22–5.81)
RDW: 20.1 % — ABNORMAL HIGH (ref 11.5–15.5)
WBC: 2.8 K/uL — ABNORMAL LOW (ref 4.0–10.5)
nRBC: 0 % (ref 0.0–0.2)

## 2023-11-29 LAB — BPAM RBC
Blood Product Expiration Date: 202511062359
ISSUE DATE / TIME: 202510011430
Unit Type and Rh: 9500

## 2023-11-29 LAB — HEMOGLOBIN AND HEMATOCRIT, BLOOD
HCT: 28.3 % — ABNORMAL LOW (ref 39.0–52.0)
Hemoglobin: 7.9 g/dL — ABNORMAL LOW (ref 13.0–17.0)

## 2023-11-29 MED ORDER — ADULT MULTIVITAMIN W/MINERALS CH
1.0000 | ORAL_TABLET | Freq: Every day | ORAL | Status: DC
  Administered 2023-11-29: 1 via ORAL
  Filled 2023-11-29 (×2): qty 1

## 2023-11-29 MED ORDER — ACETAMINOPHEN 325 MG PO TABS: 650.0000 mg | ORAL_TABLET | Freq: Four times a day (QID) | ORAL | Status: DC | PRN

## 2023-11-29 MED ORDER — THIAMINE HCL 100 MG/ML IJ SOLN
100.0000 mg | Freq: Every day | INTRAMUSCULAR | Status: DC
  Administered 2023-11-29: 100 mg via INTRAVENOUS
  Filled 2023-11-29: qty 2

## 2023-11-29 MED ORDER — SODIUM CHLORIDE 0.9 % IV SOLN: INTRAVENOUS | Status: DC

## 2023-11-29 MED ORDER — ONDANSETRON HCL 4 MG/2ML IJ SOLN: 4.0000 mg | Freq: Four times a day (QID) | INTRAMUSCULAR | Status: DC | PRN

## 2023-11-29 MED ORDER — ONDANSETRON HCL 4 MG PO TABS: 4.0000 mg | ORAL_TABLET | Freq: Four times a day (QID) | ORAL | Status: DC | PRN

## 2023-11-29 MED ORDER — LORAZEPAM 2 MG/ML IJ SOLN: 1.0000 mg | INTRAMUSCULAR | Status: DC | PRN

## 2023-11-29 MED ORDER — FOLIC ACID 1 MG PO TABS
1.0000 mg | ORAL_TABLET | Freq: Every day | ORAL | Status: DC
  Administered 2023-11-29: 1 mg via ORAL
  Filled 2023-11-29 (×2): qty 1

## 2023-11-29 MED ORDER — POLYETHYLENE GLYCOL 3350 17 G PO PACK: 17.0000 g | PACK | Freq: Every day | ORAL | Status: DC | PRN

## 2023-11-29 MED ORDER — PEG 3350-KCL-NA BICARB-NACL 420 G PO SOLR
4000.0000 mL | Freq: Once | ORAL | Status: AC
  Administered 2023-11-29: 4000 mL via ORAL

## 2023-11-29 MED ORDER — PANTOPRAZOLE SODIUM 40 MG IV SOLR
40.0000 mg | Freq: Two times a day (BID) | INTRAVENOUS | Status: DC
  Administered 2023-11-29 – 2023-11-30 (×3): 40 mg via INTRAVENOUS
  Filled 2023-11-29 (×4): qty 10

## 2023-11-29 MED ORDER — DIPHENHYDRAMINE HCL 25 MG PO CAPS
25.0000 mg | ORAL_CAPSULE | Freq: Four times a day (QID) | ORAL | Status: DC | PRN
  Administered 2023-11-29 (×2): 25 mg via ORAL
  Filled 2023-11-29 (×2): qty 1

## 2023-11-29 MED ORDER — THIAMINE MONONITRATE 100 MG PO TABS
100.0000 mg | ORAL_TABLET | Freq: Every day | ORAL | Status: DC
  Filled 2023-11-29: qty 1

## 2023-11-29 MED ORDER — ACETAMINOPHEN 650 MG RE SUPP: 650.0000 mg | Freq: Four times a day (QID) | RECTAL | Status: DC | PRN

## 2023-11-29 MED ORDER — BISACODYL 5 MG PO TBEC
10.0000 mg | DELAYED_RELEASE_TABLET | Freq: Once | ORAL | Status: AC
  Administered 2023-11-29: 10 mg via ORAL
  Filled 2023-11-29: qty 2

## 2023-11-29 MED ORDER — LORAZEPAM 1 MG PO TABS: 1.0000 mg | ORAL_TABLET | ORAL | Status: DC | PRN

## 2023-11-29 NOTE — Consult Note (Signed)
 Gastroenterology Consult   Referring Provider: No ref. provider found Primary Care Physician:  Marvine Rush, MD Primary Gastroenterologist:  Previously unassigned,   Patient ID: Thomas [name] [facility]; [id number]; May 01, 1960   Admit date: 11/28/2023  LOS: 0 days   Date of Consultation: 11/29/2023  Reason for Consultation:  acute on chronic anemia, heme + stool    History of Present Illness   Thomas [name] is a 63 y.o. 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   Patient went to his outpatient provider recently with complaints of generalized itching for the past 2 months without obvious rash and having reflux symptoms. He states he was told he needed to come to the ED due to Hgb 7.4.   ED course:  VSS. Wbc 3.5, Hgb 6.8 (8.8 04/2023), Hct 25.6, MCV 74.9, Plt 171, Na 138, K 3.9, BUN 7, Cre 0.86, alb 3.5, AST 73 (67 03/2023), ALT 47 (49 03/2023), AP 107, Tbili 1.1, INR 1.2, B12 309, folate 6.8, iron 22, TIBC 507, iron sat 4, ferritin 24, heme + stool.  Today: Hgb 7.3 (after one unit prbcs), WBC 2.8, Plt 159  GI consult: Patient reports that he went to the PCP recently for 69-month history of generalized itching  without obvious rash.  He also has been having a lot of reflux symptoms which she has had all of his life.  Describes burning and indigestion in his chest.  Denies exertional component.  He denies shortness of breath to me but review of medical records reports that he had endorsed some intermittent shortness of breath.  Recently had called cardiology denying chest pain on September 29.  He is on Prilosec OTC but does not seem to be helping.  Denies dysphagia.  Denies any issues with his bowels.  No abdominal pain.  No melena or rectal bleeding.  He reports that he cut back on his alcohol consumption after having his heart surgery in January.  He states he drinks 4-5 beers when he is out for dinner about once per week.  He has a history of remote intranasal cocaine use.  Notes that he has stopped taking all of his medication about a month ago, simply got tired of taking everything.  He has not been to his cardiologist since March.  Missed a recent visit.  Stopped going to the Coumadin  clinic.  Cited that he had moved and was missing too much work as a reason for not following up.  No prior EGD or colonoscopy.  Limited ECHO 04/2023:  IMPRESSIONS  1. Left ventricular ejection fraction, by estimation, is 70 to 75%. The  left ventricle has hyperdynamic function. The left ventricle has no  regional wall motion abnormalities. There is mild concentric left  ventricular hypertrophy.   2. Right ventricular systolic function is hyperdynamic. Tricuspid  regurgitation signal is inadequate for assessing PA pressure.   3. Trivial mitral valve  regurgitation. The mean mitral valve gradient is  9.0 mmHg with average heart rate of 90 bpm. There is a 31/33 On-X  mechanical valve present in the mitral position. Procedure Date: 03/16/23.   4. The aortic valve has been repaired/replaced. There is a 21 mm On-X  valve present in the aortic position. Procedure Date: 03/16/23. Aortic  valve mean gradient measures 15.0  mmHg. Aortic valve Vmax measures 2.60  m/s.   5. The inferior vena cava is dilated in size with >50% respiratory  variability, suggesting right atrial pressure of 8 mmHg.   Prior to Admission medications   Medication Sig Start Date End Date Taking? Authorizing Provider  acetaminophen  (TYLENOL ) 325 MG tablet Take 2 tablets (650 mg total) by mouth every 4 (four) hours as needed for mild pain (pain score 1-3) or fever. 03/26/23  Yes Gold, Wayne E, PA-C  ALPRAZolam  (XANAX ) 1 MG tablet Take 2 mg by mouth at bedtime.   Yes [provider]  Aspirin -Acetaminophen -Caffeine (GOODYS EXTRA STRENGTH PO) Take 1 Package by mouth daily as needed (pain).   Yes [provider]  magnesium  oxide (MAG-OX) 400 (240 Mg) MG tablet Take 1 tablet (400 mg total) by mouth 2 (two) times daily. 03/26/23  Yes Gold, Wayne E, PA-C  omeprazole (PRILOSEC OTC) 20 MG tablet Take 20 mg by mouth daily.   Yes [provider]  potassium chloride  SA (KLOR-CON  M) 20 MEQ tablet TAKE 1 TABLET BY MOUTH TWICE A DAY 06/04/23  Yes Wonda Sharper, MD    Current Facility-Administered Medications  Medication Dose Route Frequency Provider Last Rate Last Admin   acetaminophen  (TYLENOL ) tablet 650 mg  650 mg Oral Q6H PRN Emokpae, Ejiroghene E, MD       Or   acetaminophen  (TYLENOL ) suppository 650 mg  650 mg Rectal Q6H PRN Emokpae, Ejiroghene E, MD       ALPRAZolam  (XANAX ) tablet 2 mg  2 mg Oral QHS PRN Emokpae, Ejiroghene E, MD   2 mg at 11/28/23 2141   diphenhydrAMINE  (BENADRYL ) capsule 25 mg  25 mg Oral QHS PRN Emokpae, Ejiroghene E, MD       folic acid  (FOLVITE ) tablet 1 mg  1 mg Oral Daily Emokpae, Ejiroghene E, MD       LORazepam  (ATIVAN ) tablet 1-4 mg  1-4 mg Oral Q1H PRN Emokpae, Ejiroghene E, MD       Or   LORazepam  (ATIVAN ) injection 1-4 mg  1-4 mg Intravenous Q1H PRN Emokpae, Ejiroghene E, MD       multivitamin with minerals tablet 1 tablet  1 tablet Oral Daily Emokpae, Ejiroghene E, MD       ondansetron   (ZOFRAN ) tablet 4 mg  4 mg Oral Q6H PRN Emokpae, Ejiroghene E, MD       Or   ondansetron  (ZOFRAN ) injection 4 mg  4 mg Intravenous Q6H PRN Emokpae, Ejiroghene E, MD       pantoprazole  (PROTONIX ) injection 40 mg  40 mg Intravenous Q12H Emokpae, Ejiroghene E, MD       polyethylene glycol (MIRALAX  / GLYCOLAX ) packet 17 g  17 g Oral Daily PRN Emokpae, Ejiroghene E, MD       thiamine  (VITAMIN B1) tablet 100 mg  100 mg Oral Daily Emokpae, Ejiroghene E, MD       Or   thiamine  (VITAMIN B1) injection 100 mg  100 mg Intravenous Daily Emokpae, Ejiroghene E, MD       Current Outpatient Medications  Medication Sig Dispense Refill   acetaminophen  (  TYLENOL ) 325 MG tablet Take 2 tablets (650 mg total) by mouth every 4 (four) hours as needed for mild pain (pain score 1-3) or fever.     ALPRAZolam  (XANAX ) 1 MG tablet Take 2 mg by mouth at bedtime.     Aspirin -Acetaminophen -Caffeine (GOODYS EXTRA STRENGTH PO) Take 1 Package by mouth daily as needed (pain).     magnesium  oxide (MAG-OX) 400 (240 Mg) MG tablet Take 1 tablet (400 mg total) by mouth 2 (two) times daily. 20 tablet 0   omeprazole (PRILOSEC OTC) 20 MG tablet Take 20 mg by mouth daily.     potassium chloride  SA (KLOR-CON  M) 20 MEQ tablet TAKE 1 TABLET BY MOUTH TWICE A DAY 180 tablet 3    Allergies as of 11/28/2023 - Review Complete 11/28/2023  Allergen Reaction Noted   Hydrocodone  Rash 03/14/2023    Past Medical History:  Diagnosis Date   Blood dyscrasia    GERD (gastroesophageal reflux disease)    History of kidney stones    Hypertension    Thrombocytopenia 04/12/2016    Past Surgical History:  Procedure Laterality Date   AORTIC VALVE REPLACEMENT N/A 03/16/2023   Procedure: AORTIC VALVE REPLACEMENT (AVR) USING ON-X PROSTHETIC AORTIC HEART VALVE WITH ANATOMIC SEWING RING SIZE ;  Surgeon: Kerrin Elspeth BROCKS, MD;  Location: [facility] OR;  Service: Open Heart Surgery;  Laterality: N/A;   BONE BIOPSY Right 02/09/2023   Procedure: BONE BIOPSY;   Surgeon: Arlinda Buster, MD;  Location: [facility];  Service: Orthopedics;  Laterality: Right;   CORONARY ANGIOGRAPHY N/A 03/13/2023   Procedure: CORONARY ANGIOGRAPHY;  Surgeon: Anner Alm ORN, MD;  Location: [facility] INVASIVE CV LAB;  Service: Cardiovascular;  Laterality: N/A;   HARDWARE REMOVAL Right 02/09/2023   Procedure: RIGHT FOREARM HARDWARE REMOVAL;  Surgeon: Arlinda Buster, MD;  Location: [facility];  Service: Orthopedics;  Laterality: Right;   IR THORACENTESIS ASP PLEURAL SPACE W/IMG GUIDE  03/23/2023   IR THORACENTESIS ASP PLEURAL SPACE W/IMG GUIDE  04/06/2023   IRRIGATION AND DEBRIDEMENT ELBOW Right 02/09/2023   Procedure: RIGHT FOREARM IRRIGATION AND DEBRIDEMENT;  Surgeon: Arlinda Buster, MD;  Location: [facility];  Service: Orthopedics;  Laterality: Right;   MITRAL VALVE REPLACEMENT N/A 03/16/2023   Procedure: MITRAL VALVE (MV) REPLACEMENT USING ON-X PROSTHETIC MITRAL HEART VALVE WITH STANDARD SEWING RING SIZE 31/33MM;  Surgeon: Kerrin Elspeth BROCKS, MD;  Location: [facility] OR;  Service: Open Heart Surgery;  Laterality: N/A;   ORIF ULNAR FRACTURE Right 02/27/2023   Procedure: RIGHT OPEN REDUCTION INTERNAL FIXATION (ORIF) ULNAR FRACTURE;  Surgeon: Arlinda Buster, MD;  Location: [facility];  Service: Orthopedics;  Laterality: Right;   renalectomy Left    TEE WITHOUT CARDIOVERSION N/A 03/16/2023   Procedure: TRANSESOPHAGEAL ECHOCARDIOGRAM (TEE);  Surgeon: Kerrin Elspeth BROCKS, MD;  Location: [facility] OR;  Service: Open Heart Surgery;  Laterality: N/A;   TRANSESOPHAGEAL ECHOCARDIOGRAM (CATH LAB) N/A 03/12/2023   Procedure: TRANSESOPHAGEAL ECHOCARDIOGRAM;  Surgeon: Barbaraann Darryle Ned, MD;  Location: [facility] INVASIVE CV LAB;  Service: Cardiovascular;  Laterality: N/A;   WRIST OSTEOTOMY Right 10/24/2022   Procedure: RIGHT WRIST ULNAR SHORTENING OSTEOTOMY;  Surgeon: Arlinda Buster, MD;  Location: [facility];  Service: Orthopedics;   Laterality: Right;  regional block    Family History  Problem Relation Age of Onset   Healthy Mother    Cancer Father     Social History   Socioeconomic History   Marital status: Single    Spouse name: Not on  file   Number of children: Not on file   Years of education: Not on file   Highest education level: Not on file  Occupational History   Not on file  Tobacco Use   Smoking status: Never    Passive exposure: Never   Smokeless tobacco: Never  Vaping Use   Vaping status: Never Used  Substance and Sexual Activity   Alcohol use: Yes    Comment: occasionally   Drug use: No   Sexual activity: Yes  Other Topics Concern   Not on file  Social History Narrative   Not on file   Social Drivers of Health   Financial Resource Strain: Not on file  Food Insecurity: No Food Insecurity (03/10/2023)   Hunger Vital Sign    Worried About Running Out of Food in the Last Year: Never true    Ran Out of Food in the Last Year: Never true  Transportation Needs: No Transportation Needs (03/10/2023)   PRAPARE - Administrator, Civil Service (Medical): No    Lack of Transportation (Non-Medical): No  Physical Activity: Not on file  Stress: Not on file  Social Connections: Socially Integrated (03/10/2023)   Social Connection and Isolation Panel    Frequency of Communication with Friends and Family: Twice a week    Frequency of Social Gatherings with Friends and Family: Twice a week    Attends Religious Services: 1 to 4 times per year    Active Member of [employer] or Organizations: Yes    Attends Engineer, structural: More than 4 times per year    Marital Status: Living with partner  Intimate Partner Violence: Not At Risk (03/10/2023)   Humiliation, Afraid, Rape, and Kick questionnaire    Fear of Current or Ex-Partner: No    Emotionally Abused: No    Physically Abused: No    Sexually Abused: No     Review of System:   General: Negative for anorexia, weight loss, fever,  chills, fatigue, weakness. Eyes: Negative for vision changes.  ENT: Negative for hoarseness, difficulty swallowing , nasal congestion. CV: Negative for chest pain, angina, palpitations, dyspnea on exertion, peripheral edema.  Respiratory: Negative for dyspnea at rest, dyspnea on exertion, cough, sputum, wheezing.  GI: See history of present illness. GU:  Negative for dysuria, hematuria, urinary incontinence, urinary frequency, nocturnal urination.  MS: Negative for joint pain, low back pain.  Derm: Negative for rash or itching.  Neuro: Negative for weakness, abnormal sensation, seizure, frequent headaches, memory loss, confusion.  Psych: Negative for anxiety, depression, suicidal ideation, hallucinations.  Endo: Negative for unusual weight change.  Heme: Negative for bruising or bleeding. Allergy: Negative for rash or hives.      Physical Examination:   Vital signs in last 24 hours: Temp:  [98 F (36.7 C)-98.6 F (37 C)] 98.1 F (36.7 C) (10/01 1725) Pulse Rate:  [69-90] 72 (10/02 0530) Resp:  [18-21] 19 (10/01 2300) BP: (114-171)/(67-95) 157/75 (10/01 2000) SpO2:  [88 %-100 %] 99 % (10/02 0530) Weight:  [90.3 kg] 90.3 kg (10/01 1030)    General: Well-nourished, well-developed in no acute distress.  Head: Normocephalic, atraumatic.   Eyes: Conjunctiva pink, no icterus. Mouth: Oropharyngeal mucosa moist and pink , no lesions erythema or exudate. Neck: Supple without thyromegaly, masses, or lymphadenopathy.  Lungs: Clear to auscultation bilaterally.  Heart: Regular rate and rhythm, no murmurs rubs or gallops.  Abdomen: Bowel sounds are normal, nontender, nondistended, no hepatosplenomegaly or masses, no abdominal bruits or hernia ,  no rebound or guarding.   Rectal: not performed Extremities: No lower extremity edema, clubbing, deformity.  Neuro: Alert and oriented x 4 , grossly normal neurologically.  Skin: Warm and dry, no rash or jaundice.   Psych: Alert and cooperative,  normal mood and affect.        Intake/Output from previous day: No intake/output data recorded. Intake/Output this shift: No intake/output data recorded.  Lab Results:   CBC Recent Labs    11/28/23 1053 11/29/23 0258  WBC 3.5* 2.8*  HGB 6.8* 7.3*  HCT 25.6* 26.5*  MCV 74.9* 75.5*  PLT 171 159   BMET Recent Labs    11/28/23 1053 11/29/23 0258  NA 138 137  K 3.9 4.0  CL 107 109  CO2 21* 21*  GLUCOSE 89 91  BUN 7* 6*  CREATININE 0.86 0.76  CALCIUM  8.6* 8.4*   LFT Recent Labs    11/28/23 1053  BILITOT 1.1  ALKPHOS 107  AST 73*  ALT 47*  PROT 7.7  ALBUMIN  3.5    Lipase No results for input(s): LIPASE in the last 72 hours.  PT/INR Recent Labs    11/28/23 1053  LABPROT 16.2*  INR 1.2     Hepatitis Panel No results for input(s): HEPBSAG, HCVAB, HEPAIGM, HEPBIGM in the last 72 hours.   Imaging Studies:   DG Forearm Right Result Date: 11/07/2023 CLINICAL DATA:  pain s/p fall. EXAM: RIGHT FOREARM - 2 VIEW COMPARISON:  04/30/2023. FINDINGS: No acute fracture or dislocation. No aggressive osseous lesion. Distal right ulnar ORIF noted, without significant interval change since the prior study. Mild diffuse degenerative changes of imaged joints. No radiopaque foreign bodies. Soft tissues are within normal limits. IMPRESSION: No acute osseous abnormality of the right forearm. Electronically Signed   By: Ree Molt M.D.   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   Acute on chronic anemia/IDA: Anemia goes back to 12/2022. Last normal Hgb available was 11/2021. Multiple surgeries, hospitalizations as outlined. Presenting now with decline in Hgb to 6.8 from 04/2023 at which time Hgb was 8.8. He has normal B12, folate. Iron studies c/w IDA. 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 Recommending colonoscopy (IDA) and upper endoscopy (due to refractory gerd and IDA) this admission prior to resuming anticoagulation. Due to noncompliance will request cardiac clearance prior to procedures.   GERD: Poorly controlled on omeprazole.  -start pantoprazole  40mg  twice daily before meals.  Elevated AST/ALT: -chronically elevated -HCV Ab positive, Hep B surf Ag neg 11/2021 -check HCV RNA -cannot rule out mild etoh hepatitis  Liver lesions: On 11/2020 U/S he had indeterminate 19mm hypoechoic mass in right hepatic lobe, MRI advised. On 11/2021 U/S he had 1.4 X 0.9  X 1.2 cm simple cyst left liver lobe but previous right hepatic lobe mass not seen. MRI advised.  Plan:   Recommend colonoscopy/esophagogastroduodenoscopy while inpatient, prior to resuming coumadin . Would consider SBE prophylaxis given his history of bacterial endocarditis. Await cardiac clearance.  I have discussed the risks, alternatives, benefits with regards to but not limited to the risk of reaction to medication, bleeding, infection, perforation and the patient is agreeable to proceed. Written consent to be obtained. HCV RNA,  AFP Pantoprazole  40mg  BID. Recommend etoh cessation. Discussed medical compliance.  Outpatient liver imaging for history of liver lesions.    LOS: 0 days   We would like to thank you for the opportunity to participate in the care of Thomas [name].  Thomas [name]. Thomas [name] [facility] [phone] 10/2/20255:51 AM

## 2023-11-29 NOTE — H&P (View-Only) (Signed)
 Gastroenterology Consult   Referring Provider: No ref. provider found Primary Care Physician:  Marvine Rush, MD Primary Gastroenterologist:  Previously unassigned,   Patient ID: Thomas [name]; [id number]; May 01, 1960   Admit date: 11/28/2023  LOS: 0 days   Date of Consultation: 11/29/2023  Reason for Consultation:  acute on chronic anemia, heme + stool    History of Present Illness   Thomas [name] is a 63 y.o. 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   Patient went to his outpatient provider recently with complaints of generalized itching for the past 2 months without obvious rash and having reflux symptoms. He states he was told he needed to come to the ED due to Hgb 7.4.   ED course:  VSS. Wbc 3.5, Hgb 6.8 (8.8 04/2023), Hct 25.6, MCV 74.9, Plt 171, Na 138, K 3.9, BUN 7, Cre 0.86, alb 3.5, AST 73 (67 03/2023), ALT 47 (49 03/2023), AP 107, Tbili 1.1, INR 1.2, B12 309, folate 6.8, iron 22, TIBC 507, iron sat 4, ferritin 24, heme + stool.  Today: Hgb 7.3 (after one unit prbcs), WBC 2.8, Plt 159  GI consult: Patient reports that he went to the PCP recently for 69-month history of generalized itching  without obvious rash.  He also has been having a lot of reflux symptoms which she has had all of his life.  Describes burning and indigestion in his chest.  Denies exertional component.  He denies shortness of breath to me but review of medical records reports that he had endorsed some intermittent shortness of breath.  Recently had called cardiology denying chest pain on September 29.  He is on Prilosec OTC but does not seem to be helping.  Denies dysphagia.  Denies any issues with his bowels.  No abdominal pain.  No melena or rectal bleeding.  He reports that he cut back on his alcohol consumption after having his heart surgery in January.  He states he drinks 4-5 beers when he is out for dinner about once per week.  He has a history of remote intranasal cocaine use.  Notes that he has stopped taking all of his medication about a month ago, simply got tired of taking everything.  He has not been to his cardiologist since March.  Missed a recent visit.  Stopped going to the Coumadin  clinic.  Cited that he had moved and was missing too much work as a reason for not following up.  No prior EGD or colonoscopy.  Limited ECHO 04/2023:  IMPRESSIONS  1. Left ventricular ejection fraction, by estimation, is 70 to 75%. The  left ventricle has hyperdynamic function. The left ventricle has no  regional wall motion abnormalities. There is mild concentric left  ventricular hypertrophy.   2. Right ventricular systolic function is hyperdynamic. Tricuspid  regurgitation signal is inadequate for assessing PA pressure.   3. Trivial mitral valve  regurgitation. The mean mitral valve gradient is  9.0 mmHg with average heart rate of 90 bpm. There is a 31/33 On-X  mechanical valve present in the mitral position. Procedure Date: 03/16/23.   4. The aortic valve has been repaired/replaced. There is a 21 mm On-X  valve present in the aortic position. Procedure Date: 03/16/23. Aortic  valve mean gradient measures 15.0  mmHg. Aortic valve Vmax measures 2.60  m/s.   5. The inferior vena cava is dilated in size with >50% respiratory  variability, suggesting right atrial pressure of 8 mmHg.   Prior to Admission medications   Medication Sig Start Date End Date Taking? Authorizing Provider  acetaminophen  (TYLENOL ) 325 MG tablet Take 2 tablets (650 mg total) by mouth every 4 (four) hours as needed for mild pain (pain score 1-3) or fever. 03/26/23  Yes Gold, Wayne E, PA-C  ALPRAZolam  (XANAX ) 1 MG tablet Take 2 mg by mouth at bedtime.   Yes [provider]  Aspirin -Acetaminophen -Caffeine (GOODYS EXTRA STRENGTH PO) Take 1 Package by mouth daily as needed (pain).   Yes [provider]  magnesium  oxide (MAG-OX) 400 (240 Mg) MG tablet Take 1 tablet (400 mg total) by mouth 2 (two) times daily. 03/26/23  Yes Gold, Wayne E, PA-C  omeprazole (PRILOSEC OTC) 20 MG tablet Take 20 mg by mouth daily.   Yes [provider]  potassium chloride  SA (KLOR-CON  M) 20 MEQ tablet TAKE 1 TABLET BY MOUTH TWICE A DAY 06/04/23  Yes Wonda Sharper, MD    Current Facility-Administered Medications  Medication Dose Route Frequency Provider Last Rate Last Admin   acetaminophen  (TYLENOL ) tablet 650 mg  650 mg Oral Q6H PRN Emokpae, Ejiroghene E, MD       Or   acetaminophen  (TYLENOL ) suppository 650 mg  650 mg Rectal Q6H PRN Emokpae, Ejiroghene E, MD       ALPRAZolam  (XANAX ) tablet 2 mg  2 mg Oral QHS PRN Emokpae, Ejiroghene E, MD   2 mg at 11/28/23 2141   diphenhydrAMINE  (BENADRYL ) capsule 25 mg  25 mg Oral QHS PRN Emokpae, Ejiroghene E, MD       folic acid  (FOLVITE ) tablet 1 mg  1 mg Oral Daily Emokpae, Ejiroghene E, MD       LORazepam  (ATIVAN ) tablet 1-4 mg  1-4 mg Oral Q1H PRN Emokpae, Ejiroghene E, MD       Or   LORazepam  (ATIVAN ) injection 1-4 mg  1-4 mg Intravenous Q1H PRN Emokpae, Ejiroghene E, MD       multivitamin with minerals tablet 1 tablet  1 tablet Oral Daily Emokpae, Ejiroghene E, MD       ondansetron   (ZOFRAN ) tablet 4 mg  4 mg Oral Q6H PRN Emokpae, Ejiroghene E, MD       Or   ondansetron  (ZOFRAN ) injection 4 mg  4 mg Intravenous Q6H PRN Emokpae, Ejiroghene E, MD       pantoprazole  (PROTONIX ) injection 40 mg  40 mg Intravenous Q12H Emokpae, Ejiroghene E, MD       polyethylene glycol (MIRALAX  / GLYCOLAX ) packet 17 g  17 g Oral Daily PRN Emokpae, Ejiroghene E, MD       thiamine  (VITAMIN B1) tablet 100 mg  100 mg Oral Daily Emokpae, Ejiroghene E, MD       Or   thiamine  (VITAMIN B1) injection 100 mg  100 mg Intravenous Daily Emokpae, Ejiroghene E, MD       Current Outpatient Medications  Medication Sig Dispense Refill   acetaminophen  (  TYLENOL ) 325 MG tablet Take 2 tablets (650 mg total) by mouth every 4 (four) hours as needed for mild pain (pain score 1-3) or fever.     ALPRAZolam  (XANAX ) 1 MG tablet Take 2 mg by mouth at bedtime.     Aspirin -Acetaminophen -Caffeine (GOODYS EXTRA STRENGTH PO) Take 1 Package by mouth daily as needed (pain).     magnesium  oxide (MAG-OX) 400 (240 Mg) MG tablet Take 1 tablet (400 mg total) by mouth 2 (two) times daily. 20 tablet 0   omeprazole (PRILOSEC OTC) 20 MG tablet Take 20 mg by mouth daily.     potassium chloride  SA (KLOR-CON  M) 20 MEQ tablet TAKE 1 TABLET BY MOUTH TWICE A DAY 180 tablet 3    Allergies as of 11/28/2023 - Review Complete 11/28/2023  Allergen Reaction Noted   Hydrocodone  Rash 03/14/2023    Past Medical History:  Diagnosis Date   Blood dyscrasia    GERD (gastroesophageal reflux disease)    History of kidney stones    Hypertension    Thrombocytopenia 04/12/2016    Past Surgical History:  Procedure Laterality Date   AORTIC VALVE REPLACEMENT N/A 03/16/2023   Procedure: AORTIC VALVE REPLACEMENT (AVR) USING ON-X PROSTHETIC AORTIC HEART VALVE WITH ANATOMIC SEWING RING SIZE ;  Surgeon: Kerrin Elspeth BROCKS, MD;  Location: [facility] OR;  Service: Open Heart Surgery;  Laterality: N/A;   BONE BIOPSY Right 02/09/2023   Procedure: BONE BIOPSY;   Surgeon: Arlinda Buster, MD;  Location: [name];  Service: Orthopedics;  Laterality: Right;   CORONARY ANGIOGRAPHY N/A 03/13/2023   Procedure: CORONARY ANGIOGRAPHY;  Surgeon: Anner Alm ORN, MD;  Location: [facility] INVASIVE CV LAB;  Service: Cardiovascular;  Laterality: N/A;   HARDWARE REMOVAL Right 02/09/2023   Procedure: RIGHT FOREARM HARDWARE REMOVAL;  Surgeon: Arlinda Buster, MD;  Location: [name];  Service: Orthopedics;  Laterality: Right;   IR THORACENTESIS ASP PLEURAL SPACE W/IMG GUIDE  03/23/2023   IR THORACENTESIS ASP PLEURAL SPACE W/IMG GUIDE  04/06/2023   IRRIGATION AND DEBRIDEMENT ELBOW Right 02/09/2023   Procedure: RIGHT FOREARM IRRIGATION AND DEBRIDEMENT;  Surgeon: Arlinda Buster, MD;  Location: [name];  Service: Orthopedics;  Laterality: Right;   MITRAL VALVE REPLACEMENT N/A 03/16/2023   Procedure: MITRAL VALVE (MV) REPLACEMENT USING ON-X PROSTHETIC MITRAL HEART VALVE WITH STANDARD SEWING RING SIZE 31/33MM;  Surgeon: Kerrin Elspeth BROCKS, MD;  Location: [name];  Service: Open Heart Surgery;  Laterality: N/A;   ORIF ULNAR FRACTURE Right 02/27/2023   Procedure: RIGHT OPEN REDUCTION INTERNAL FIXATION (ORIF) ULNAR FRACTURE;  Surgeon: Arlinda Buster, MD;  Location: [name];  Service: Orthopedics;  Laterality: Right;   renalectomy Left    TEE WITHOUT CARDIOVERSION N/A 03/16/2023   Procedure: TRANSESOPHAGEAL ECHOCARDIOGRAM (TEE);  Surgeon: Kerrin Elspeth BROCKS, MD;  Location: [facility] OR;  Service: Open Heart Surgery;  Laterality: N/A;   TRANSESOPHAGEAL ECHOCARDIOGRAM (CATH LAB) N/A 03/12/2023   Procedure: TRANSESOPHAGEAL ECHOCARDIOGRAM;  Surgeon: Barbaraann Darryle Ned, MD;  Location: [facility] INVASIVE CV LAB;  Service: Cardiovascular;  Laterality: N/A;   WRIST OSTEOTOMY Right 10/24/2022   Procedure: RIGHT WRIST ULNAR SHORTENING OSTEOTOMY;  Surgeon: Arlinda Buster, MD;  Location: [name];  Service: Orthopedics;   Laterality: Right;  regional block    Family History  Problem Relation Age of Onset   Healthy Mother    Cancer Father     Social History   Socioeconomic History   Marital status: Single    Spouse name: Not on  file   Number of children: Not on file   Years of education: Not on file   Highest education level: Not on file  Occupational History   Not on file  Tobacco Use   Smoking status: Never    Passive exposure: Never   Smokeless tobacco: Never  Vaping Use   Vaping status: Never Used  Substance and Sexual Activity   Alcohol use: Yes    Comment: occasionally   Drug use: No   Sexual activity: Yes  Other Topics Concern   Not on file  Social History Narrative   Not on file   Social Drivers of Health   Financial Resource Strain: Not on file  Food Insecurity: No Food Insecurity (03/10/2023)   Hunger Vital Sign    Worried About Running Out of Food in the Last Year: Never true    Ran Out of Food in the Last Year: Never true  Transportation Needs: No Transportation Needs (03/10/2023)   PRAPARE - Administrator, Civil Service (Medical): No    Lack of Transportation (Non-Medical): No  Physical Activity: Not on file  Stress: Not on file  Social Connections: Socially Integrated (03/10/2023)   Social Connection and Isolation Panel    Frequency of Communication with Friends and Family: Twice a week    Frequency of Social Gatherings with Friends and Family: Twice a week    Attends Religious Services: 1 to 4 times per year    Active Member of [employer] or Organizations: Yes    Attends Engineer, structural: More than 4 times per year    Marital Status: Living with partner  Intimate Partner Violence: Not At Risk (03/10/2023)   Humiliation, Afraid, Rape, and Kick questionnaire    Fear of Current or Ex-Partner: No    Emotionally Abused: No    Physically Abused: No    Sexually Abused: No     Review of System:   General: Negative for anorexia, weight loss, fever,  chills, fatigue, weakness. Eyes: Negative for vision changes.  ENT: Negative for hoarseness, difficulty swallowing , nasal congestion. CV: Negative for chest pain, angina, palpitations, dyspnea on exertion, peripheral edema.  Respiratory: Negative for dyspnea at rest, dyspnea on exertion, cough, sputum, wheezing.  GI: See history of present illness. GU:  Negative for dysuria, hematuria, urinary incontinence, urinary frequency, nocturnal urination.  MS: Negative for joint pain, low back pain.  Derm: Negative for rash or itching.  Neuro: Negative for weakness, abnormal sensation, seizure, frequent headaches, memory loss, confusion.  Psych: Negative for anxiety, depression, suicidal ideation, hallucinations.  Endo: Negative for unusual weight change.  Heme: Negative for bruising or bleeding. Allergy: Negative for rash or hives.      Physical Examination:   Vital signs in last 24 hours: Temp:  [98 F (36.7 C)-98.6 F (37 C)] 98.1 F (36.7 C) (10/01 1725) Pulse Rate:  [69-90] 72 (10/02 0530) Resp:  [18-21] 19 (10/01 2300) BP: (114-171)/(67-95) 157/75 (10/01 2000) SpO2:  [88 %-100 %] 99 % (10/02 0530) Weight:  [90.3 kg] 90.3 kg (10/01 1030)    General: Well-nourished, well-developed in no acute distress.  Head: Normocephalic, atraumatic.   Eyes: Conjunctiva pink, no icterus. Mouth: Oropharyngeal mucosa moist and pink , no lesions erythema or exudate. Neck: Supple without thyromegaly, masses, or lymphadenopathy.  Lungs: Clear to auscultation bilaterally.  Heart: Regular rate and rhythm, no murmurs rubs or gallops.  Abdomen: Bowel sounds are normal, nontender, nondistended, no hepatosplenomegaly or masses, no abdominal bruits or hernia ,  no rebound or guarding.   Rectal: not performed Extremities: No lower extremity edema, clubbing, deformity.  Neuro: Alert and oriented x 4 , grossly normal neurologically.  Skin: Warm and dry, no rash or jaundice.   Psych: Alert and cooperative,  normal mood and affect.        Intake/Output from previous day: No intake/output data recorded. Intake/Output this shift: No intake/output data recorded.  Lab Results:   CBC Recent Labs    11/28/23 1053 11/29/23 0258  WBC 3.5* 2.8*  HGB 6.8* 7.3*  HCT 25.6* 26.5*  MCV 74.9* 75.5*  PLT 171 159   BMET Recent Labs    11/28/23 1053 11/29/23 0258  NA 138 137  K 3.9 4.0  CL 107 109  CO2 21* 21*  GLUCOSE 89 91  BUN 7* 6*  CREATININE 0.86 0.76  CALCIUM  8.6* 8.4*   LFT Recent Labs    11/28/23 1053  BILITOT 1.1  ALKPHOS 107  AST 73*  ALT 47*  PROT 7.7  ALBUMIN  3.5    Lipase No results for input(s): LIPASE in the last 72 hours.  PT/INR Recent Labs    11/28/23 1053  LABPROT 16.2*  INR 1.2     Hepatitis Panel No results for input(s): HEPBSAG, HCVAB, HEPAIGM, HEPBIGM in the last 72 hours.   Imaging Studies:   DG Forearm Right Result Date: 11/07/2023 CLINICAL DATA:  pain s/p fall. EXAM: RIGHT FOREARM - 2 VIEW COMPARISON:  04/30/2023. FINDINGS: No acute fracture or dislocation. No aggressive osseous lesion. Distal right ulnar ORIF noted, without significant interval change since the prior study. Mild diffuse degenerative changes of imaged joints. No radiopaque foreign bodies. Soft tissues are within normal limits. IMPRESSION: No acute osseous abnormality of the right forearm. Electronically Signed   By: Ree Molt M.D.   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   Acute on chronic anemia/IDA: Anemia goes back to 12/2022. Last normal Hgb available was 11/2021. Multiple surgeries, hospitalizations as outlined. Presenting now with decline in Hgb to 6.8 from 04/2023 at which time Hgb was 8.8. He has normal B12, folate. Iron studies c/w IDA. 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 Recommending colonoscopy (IDA) and upper endoscopy (due to refractory gerd and IDA) this admission prior to resuming anticoagulation. Due to noncompliance will request cardiac clearance prior to procedures.   GERD: Poorly controlled on omeprazole.  -start pantoprazole  40mg  twice daily before meals.  Elevated AST/ALT: -chronically elevated -HCV Ab positive, Hep B surf Ag neg 11/2021 -check HCV RNA -cannot rule out mild etoh hepatitis  Liver lesions: On 11/2020 U/S he had indeterminate 19mm hypoechoic mass in right hepatic lobe, MRI advised. On 11/2021 U/S he had 1.4 X 0.9  X 1.2 cm simple cyst left liver lobe but previous right hepatic lobe mass not seen. MRI advised.  Plan:   Recommend colonoscopy/esophagogastroduodenoscopy while inpatient, prior to resuming coumadin . Would consider SBE prophylaxis given his history of bacterial endocarditis. Await cardiac clearance.  I have discussed the risks, alternatives, benefits with regards to but not limited to the risk of reaction to medication, bleeding, infection, perforation and the patient is agreeable to proceed. Written consent to be obtained. HCV RNA,  AFP Pantoprazole  40mg  BID. Recommend etoh cessation. Discussed medical compliance.  Outpatient liver imaging for history of liver lesions.    LOS: 0 days   We would like to thank you for the opportunity to participate in the care of BERNAL LUHMAN.  Sonny RAMAN. Ezzard RIGGERS [name] [phone] 10/2/20255:51 AM

## 2023-11-29 NOTE — Consult Note (Signed)
 CARDIOLOGY CONSULT NOTE    Patient ID: Thomas [name]; [id number]; 1960-06-14   Admit date: 11/28/2023 Date of Consult: 11/29/2023  Primary Care Provider: Marvine Rush, MD Primary Cardiologist: Dr Wonda Primary Electrophysiologist:     History of Present Illness:   Thomas [name]  is a 63 y.o. 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 Cinderella.  History of Present Illness:  Thomas [name] was last examined by Scot Ford, PA in 04/2023 and he had previously been experiencing lower extremity edema but symptoms had improved with dose adjustment of torsemide . Prior echocardiogram had shown a large pericardial effusion and he was scheduled for pericardiocentesis but limited echocardiogram the day of the procedure showed only a very small amount of fluid and the procedure was therefore canceled. He has since no showed or canceled subsequent office visits. Last INR check appears to have been in 05/2023.  Patient is asymptomatic.  He had generalized itching for which he went to the PCP for annual checkup.  Routine labs revealed hemoglobin 6.8 for which he was told to go to the ER.  He denies having any symptoms of angina, DOE, dizziness, lightness, syncope, leg swelling.  He is doing great overall.  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   Blood dyscrasia    GERD (gastroesophageal reflux disease)    History of ETOH abuse     History of kidney stones    Hypertension    Solitary kidney, acquired    Thrombocytopenia 04/12/2016    Past Surgical History:  Procedure Laterality Date   AORTIC VALVE REPLACEMENT N/A 03/16/2023   Procedure: AORTIC VALVE REPLACEMENT (AVR) USING ON-X PROSTHETIC AORTIC HEART VALVE WITH ANATOMIC SEWING RING SIZE ;  Surgeon: Kerrin Elspeth BROCKS, MD;  Location: [facility] OR;  Service: Open Heart Surgery;  Laterality: N/A;   BONE BIOPSY Right 02/09/2023   Procedure: BONE BIOPSY;  Surgeon: Arlinda Buster, MD;  Location: [facility];  Service: Orthopedics;  Laterality: Right;   CORONARY ANGIOGRAPHY N/A 03/13/2023   Procedure: CORONARY ANGIOGRAPHY;  Surgeon: Anner Alm LELON, MD;  Location: [facility] INVASIVE CV LAB;  Service: Cardiovascular;  Laterality: N/A;   HARDWARE REMOVAL Right 02/09/2023   Procedure: RIGHT FOREARM HARDWARE REMOVAL;  Surgeon: Arlinda Buster, MD;  Location: [facility];  Service: Orthopedics;  Laterality: Right;   IR THORACENTESIS ASP PLEURAL SPACE W/IMG GUIDE  03/23/2023   IR THORACENTESIS ASP PLEURAL SPACE W/IMG GUIDE  04/06/2023   IRRIGATION AND DEBRIDEMENT ELBOW Right 02/09/2023   Procedure: RIGHT FOREARM IRRIGATION AND DEBRIDEMENT;  Surgeon: Arlinda Buster, MD;  Location: [facility];  Service: Orthopedics;  Laterality: Right;   MITRAL VALVE REPLACEMENT N/A 03/16/2023   Procedure: MITRAL VALVE (MV) REPLACEMENT USING ON-X PROSTHETIC MITRAL HEART VALVE WITH STANDARD SEWING RING SIZE 31/33MM;  Surgeon: Kerrin Elspeth BROCKS, MD;  Location: [facility] OR;  Service: Open Heart Surgery;  Laterality: N/A;   ORIF ULNAR FRACTURE Right 02/27/2023   Procedure:  RIGHT OPEN REDUCTION INTERNAL FIXATION (ORIF) ULNAR FRACTURE;  Surgeon: Arlinda Buster, MD;  Location: [facility];  Service: Orthopedics;  Laterality: Right;   renalectomy Left    TEE WITHOUT CARDIOVERSION N/A 03/16/2023   Procedure: TRANSESOPHAGEAL ECHOCARDIOGRAM (TEE);  Surgeon:  Kerrin Elspeth BROCKS, MD;  Location: [facility];  Service: Open Heart Surgery;  Laterality: N/A;   TRANSESOPHAGEAL ECHOCARDIOGRAM (CATH LAB) N/A 03/12/2023   Procedure: TRANSESOPHAGEAL ECHOCARDIOGRAM;  Surgeon: Barbaraann Darryle Ned, MD;  Location: [facility] INVASIVE CV LAB;  Service: Cardiovascular;  Laterality: N/A;   WRIST OSTEOTOMY Right 10/24/2022   Procedure: RIGHT WRIST ULNAR SHORTENING OSTEOTOMY;  Surgeon: Arlinda Buster, MD;  Location: [facility];  Service: Orthopedics;  Laterality: Right;  regional block       Inpatient Medications: Scheduled Meds:  folic acid   1 mg Oral Daily   multivitamin with minerals  1 tablet Oral Daily   pantoprazole  (PROTONIX ) IV  40 mg Intravenous Q12H   thiamine   100 mg Oral Daily   Or   thiamine   100 mg Intravenous Daily   Continuous Infusions:  PRN Meds: acetaminophen  **OR** acetaminophen , ALPRAZolam , diphenhydrAMINE , LORazepam  **OR** LORazepam , ondansetron  **OR** ondansetron  (ZOFRAN ) IV, polyethylene glycol  Allergies:    Allergies  Allergen Reactions   Hydrocodone  Rash    Social History:   Social History   Socioeconomic History   Marital status: Single    Spouse name: Not on file   Number of children: Not on file   Years of education: Not on file   Highest education level: Not on file  Occupational History   Not on file  Tobacco Use   Smoking status: Never    Passive exposure: Never   Smokeless tobacco: Never  Vaping Use   Vaping status: Never Used  Substance and Sexual Activity   Alcohol use: Yes    Comment: prior heavy use, reports 5-6 beers when eating out about once per week (11/29/23)   Drug use: No    Comment: remote cocaine (intranasal), last use 2010   Sexual activity: Yes  Other Topics Concern   Not on file  Social History Narrative   Not on file   Social Drivers of Health   Financial Resource Strain: Not on file  Food Insecurity: No Food Insecurity (11/29/2023)   Hunger Vital Sign    Worried About  Running Out of Food in the Last Year: Never true    Ran Out of Food in the Last Year: Never true  Transportation Needs: No Transportation Needs (11/29/2023)   PRAPARE - Administrator, Civil Service (Medical): No    Lack of Transportation (Non-Medical): No  Physical Activity: Not on file  Stress: Not on file  Social Connections: Socially Integrated (11/29/2023)   Social Connection and Isolation Panel    Frequency of Communication with Friends and Family: Twice a week    Frequency of Social Gatherings with Friends and Family: Twice a week    Attends Religious Services: 1 to 4 times per year    Active Member of [employer] or Organizations: Yes    Attends Banker Meetings: More than 4 times per year    Marital Status: Living with partner  Intimate Partner Violence: Not At Risk (11/29/2023)   Humiliation, Afraid, Rape, and Kick questionnaire    Fear of Current or Ex-Partner: No    Emotionally Abused: No    Physically Abused: No    Sexually Abused: No    Family History:  Family History  Problem Relation Age of Onset   Healthy Mother    Cancer Father      ROS:  Please see the history of present illness.  ROS  All other ROS reviewed and negative.     Physical Exam/Data:   Vitals:   11/29/23 0650 11/29/23 0800 11/29/23 0938 11/29/23 1006  BP:  (!) 143/67  (!) 152/73  Pulse: 64 78  80  Resp:  16  17  Temp:   98.5 F (36.9 C) 98.4 F (36.9 C)  TempSrc:   Oral   SpO2: 98% 93%  99%  Weight:      Height:       No intake or output data in the 24 hours ending 11/29/23 1115 Filed Weights   11/28/23 1030  Weight: 90.3 kg   Body mass index is 27.75 kg/m.  General:  Well nourished, well developed, in no acute distress HEENT: normal Lymph: no adenopathy Neck: no JVD Endocrine:  No thryomegaly Vascular: No carotid bruits; FA pulses 2+ bilaterally without bruits  Cardiac:  normal S1, S2; RRR; no murmur  Lungs:  clear to auscultation bilaterally, no  wheezing, rhonchi or rales  Abd: soft, nontender, no hepatomegaly  Ext: no edema Musculoskeletal:  No deformities, BUE and BLE strength normal and equal Skin: warm and dry  Neuro:  CNs 2-12 intact, no focal abnormalities noted Psych:  Normal affect   EKG:  The EKG was personally reviewed and demonstrates:   Telemetry:  Telemetry was personally reviewed and demonstrates:    Relevant CV Studies:   Laboratory Data:  Chemistry Recent Labs  Lab 11/28/23 1053 11/29/23 0258  NA 138 137  K 3.9 4.0  CL 107 109  CO2 21* 21*  GLUCOSE 89 91  BUN 7* 6*  CREATININE 0.86 0.76  CALCIUM  8.6* 8.4*  GFRNONAA >60 >60  ANIONGAP 10 8    Recent Labs  Lab 11/28/23 1053  PROT 7.7  ALBUMIN  3.5  AST 73*  ALT 47*  ALKPHOS 107  BILITOT 1.1   Hematology Recent Labs  Lab 11/28/23 1053 11/28/23 1250 11/29/23 0258  WBC 3.5*  --  2.8*  RBC 3.42* 3.29* 3.51*  HGB 6.8*  --  7.3*  HCT 25.6*  --  26.5*  MCV 74.9*  --  75.5*  MCH 19.9*  --  20.8*  MCHC 26.6*  --  27.5*  RDW 20.4*  --  20.1*  PLT 171  --  159   Cardiac EnzymesNo results for input(s): TROPONINI in the last 168 hours. No results for input(s): TROPIPOC in the last 168 hours.  BNPNo results for input(s): BNP, PROBNP in the last 168 hours.  DDimer No results for input(s): DDIMER in the last 168 hours.  Radiology/Studies:  No results found.  Assessment and Plan:    Preop cardiac risk stratification for EGD Severe iron deficiency anemia - Asymptomatic.  Denied having any angina, DOE, dizziness/lightheadedness, syncope or leg swelling. - Hb baseline has been around 8 since 12/2022.  Never had any bleeding in the last 6 months. - EKG pending today.  Telemetry reviewed, in NSR. - Low risk for any perioperative cardiac complications. - Obtain echocardiogram today.  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  He did not have any active bleeding at that time nor now.  He will be n.p.o. after  midnight for scope.  After the procedure, he will need to be restarted on Coumadin  with bridging Lovenox .  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    60 minutes spent in reviewing the prior records/test/reports/more than 3 labs, discussion of the above problems with the patient, specialist teams, documentation.  I answered all his questions.   For questions or updates, please contact [facility] Please consult [email or web address] for contact info under Cardiology/STEMI.   Signed, Miabella Shannahan Priya Coretha Creswell, MD 11/29/2023 11:15 AM

## 2023-11-29 NOTE — ED Notes (Signed)
 Patient states that he is going to leave after the colonoscopy and endoscopy tomorrow due to him needing to be at his new job on Saturday.

## 2023-11-29 NOTE — Progress Notes (Signed)
 Transition of Care Department [facility]) has reviewed patient and no other TOC needs have been identified at this time. We will continue to monitor patient advancement through interdisciplinary progression rounds. If new patient transition needs arise, please place a TOC consult.   11/29/23 0803  TOC Brief Assessment  Insurance and Status Reviewed  Patient has primary care physician Yes  Home environment has been reviewed Lives alone.  Prior level of function: Independent.  Prior/Current Home Services No current home services  Social Drivers of Health Review SDOH reviewed no interventions necessary  Readmission risk has been reviewed Yes  Transition of care needs no transition of care needs at this time

## 2023-11-29 NOTE — Plan of Care (Signed)
   Problem: Activity: Goal: Risk for activity intolerance will decrease Outcome: Progressing   Problem: Coping: Goal: Level of anxiety will decrease Outcome: Progressing

## 2023-11-29 NOTE — Progress Notes (Addendum)
 [facility] PROGRESS NOTE   [name] FMW:[id number] DOB: 06-01-1960 DOA: 11/28/2023  PCP: Marvine Rush, MD  Brief History: 63 y.o. male with medical history significant for diastolic CHF, alcohol abuse, pancytopenia, bacterial endocarditis, pericardial effusion, status post aortic and mitral valve repair.  He has been off of his warfarin for a month.  He went to his outpatient provider with complaints of generalized itching.  Blood work was done which revealed a low hemoglobin so he was asked by his PCP to come to the emergency department.  Hemoglobin noted to be 6.8.  Patient was hospitalized for further management.  Consultants: Gastroenterology  Procedures: None yet    Subjective/Interval History: Patient denies any abdominal pain nausea or vomiting.  Has not noticed any black stools or blood in the stool.  No other bleeding from anywhere else.  Drinks 5 beers on a daily basis.  Has been off of warfarin for a month.  He says that he removed recently and never bothered to refill his medications.   Assessment/Plan:  Microcytic anemia Hemoglobin was as low as 6.8.  He was transfused 1 unit of PRBC.  Hemoglobin noted to be 7.3 this morning. Anemia panel shows ferritin of 24, iron 22, TIBC 5 7, percent saturation 4.  Folic acid  6.8.  Vitamin B12 309.  There is concern for iron deficiency.  Will benefit from iron supplementation. FOBT was positive. GI has been consulted.  Will likely need endoscopic evaluation.  He needs to be on warfarin for history of mitral and aortic valve replacement although he has been noncompliant recently. Continue PPI  History of endocarditis status post aortic and mitral valve replacement Followed by cardiology.  Underwent the surgery earlier this year.  Was also followed by cardiothoracic surgery. He supposed be on warfarin but has not been on it for a month. Will continue to hold anticoagulation due to concern for occult GI bleed. Once  cleared by gastroenterology he will need to be placed back on anticoagulation.  Ideally should be on heparin  along with warfarin until INR is therapeutic.  Alcohol abuse/Hep C He was counseled about his significant alcohol consumption especially in view of the fact that he is on anticoagulation. CIWA protocol.  Thiamine  multivitamins folic acid . Elevated AST/ALt noted. Recheck in AM. Hep C positive results noted from 2023. GI following.  Borderline low B12 level Will benefit from B12 supplementation.  Essential hypertension Not on any antihypertensives.  Blood pressure was elevated yesterday though better this morning.  Continue to monitor.  Chronic diastolic CHF Echocardiogram from March 2024 showed normal LVEF.  Currently well compensated.  Not on diuretics prior to admission.  Continue to monitor.  DVT Prophylaxis: SCDs Code Status: Full code Family Communication: Discussed with patient Disposition Plan: Hopefully return home when workup has been completed  Status is: Observation The patient will require care spanning > 2 midnights and should be moved to inpatient because: Need for GI procedures      Medications: Scheduled:  folic acid   1 mg Oral Daily   multivitamin with minerals  1 tablet Oral Daily   pantoprazole  (PROTONIX ) IV  40 mg Intravenous Q12H   thiamine   100 mg Oral Daily   Or   thiamine   100 mg Intravenous Daily   Continuous: PRN:acetaminophen  **OR** acetaminophen , ALPRAZolam , diphenhydrAMINE , LORazepam  **OR** LORazepam , ondansetron  **OR** ondansetron  (ZOFRAN ) IV, polyethylene glycol    Objective:  Vital Signs  Vitals:   11/29/23 0640 11/29/23 0645 11/29/23 0650 11/29/23 0800  BP:    ROLLEN)  143/67  Pulse: 64 65 64 78  Resp:    16  Temp:      TempSrc:      SpO2: 99% 97% 98% 93%  Weight:      Height:       No intake or output data in the 24 hours ending 11/29/23 0849 Filed Weights   11/28/23 1030  Weight: 90.3 kg    General appearance: Awake  alert.  In no distress Resp: Clear to auscultation bilaterally.  Normal effort Cardio: S1-S2 is normal regular.  No S3-S4.  No rubs murmurs or bruit GI: Abdomen is soft.  Nontender nondistended.  Bowel sounds are present normal.  No masses organomegaly Extremities: No edema.  Full range of motion of lower extremities. Neurologic: Alert and oriented x3.  No focal neurological deficits.    Lab Results:  Data Reviewed: I have personally reviewed following labs and reports of the imaging studies  CBC: Recent Labs  Lab 11/28/23 1053 11/29/23 0258  WBC 3.5* 2.8*  NEUTROABS 1.5*  --   HGB 6.8* 7.3*  HCT 25.6* 26.5*  MCV 74.9* 75.5*  PLT 171 159    Basic Metabolic Panel: Recent Labs  Lab 11/28/23 1053 11/28/23 1250 11/29/23 0258  NA 138  --  137  K 3.9  --  4.0  CL 107  --  109  CO2 21*  --  21*  GLUCOSE 89  --  91  BUN 7*  --  6*  CREATININE 0.86  --  0.76  CALCIUM  8.6*  --  8.4*  MG  --  2.0  --     GFR: Estimated Creatinine Clearance: 100.7 mL/min (by C-G formula based on SCr of 0.76 mg/dL).  Liver Function Tests: Recent Labs  Lab 11/28/23 1053  AST 73*  ALT 47*  ALKPHOS 107  BILITOT 1.1  PROT 7.7  ALBUMIN  3.5    Coagulation Profile: Recent Labs  Lab 11/28/23 1053  INR 1.2    Anemia Panel: Recent Labs    11/28/23 1250  VITAMINB12 309  FOLATE 6.8  FERRITIN 24  TIBC 507*  IRON 22*  RETICCTPCT 1.2    Radiology Studies: No results found.     LOS: 0 days   Clayborne Divis [employer] on [email or web address]  11/29/2023, 8:49 AM

## 2023-11-29 NOTE — Plan of Care (Signed)

## 2023-11-29 NOTE — Progress Notes (Signed)
*  PRELIMINARY RESULTS* Echocardiogram 2D Echocardiogram has been performed.  Thomas [name] 11/29/2023, 2:06 PM

## 2023-11-29 NOTE — ED Notes (Signed)
 Attempted to call 300 floor twice to let them know that patient is headed up and to see if the nurse has any questions.

## 2023-11-30 ENCOUNTER — Inpatient Hospital Stay (HOSPITAL_COMMUNITY): Admitting: Anesthesiology

## 2023-11-30 ENCOUNTER — Telehealth: Payer: Self-pay | Admitting: Gastroenterology

## 2023-11-30 ENCOUNTER — Encounter (HOSPITAL_COMMUNITY)
Admission: EM | Disposition: A | Discharge status: Home / Self Care | Payer: Self-pay | Source: Home / Self Care | Attending: Emergency Medicine

## 2023-11-30 ENCOUNTER — Other Ambulatory Visit (HOSPITAL_COMMUNITY): Payer: Self-pay

## 2023-11-30 ENCOUNTER — Telehealth (HOSPITAL_COMMUNITY): Payer: Self-pay | Admitting: Pharmacy Technician

## 2023-11-30 DIAGNOSIS — I2510 Atherosclerotic heart disease of native coronary artery without angina pectoris: Secondary | ICD-10-CM | POA: Diagnosis not present

## 2023-11-30 DIAGNOSIS — K635 Polyp of colon: Secondary | ICD-10-CM

## 2023-11-30 DIAGNOSIS — K2970 Gastritis, unspecified, without bleeding: Secondary | ICD-10-CM

## 2023-11-30 DIAGNOSIS — K3189 Other diseases of stomach and duodenum: Secondary | ICD-10-CM

## 2023-11-30 DIAGNOSIS — D122 Benign neoplasm of ascending colon: Secondary | ICD-10-CM | POA: Diagnosis not present

## 2023-11-30 DIAGNOSIS — D500 Iron deficiency anemia secondary to blood loss (chronic): Principal | ICD-10-CM

## 2023-11-30 DIAGNOSIS — I110 Hypertensive heart disease with heart failure: Secondary | ICD-10-CM

## 2023-11-30 DIAGNOSIS — D123 Benign neoplasm of transverse colon: Secondary | ICD-10-CM

## 2023-11-30 DIAGNOSIS — K648 Other hemorrhoids: Secondary | ICD-10-CM

## 2023-11-30 DIAGNOSIS — I5031 Acute diastolic (congestive) heart failure: Secondary | ICD-10-CM | POA: Diagnosis not present

## 2023-11-30 DIAGNOSIS — D649 Anemia, unspecified: Secondary | ICD-10-CM | POA: Diagnosis not present

## 2023-11-30 DIAGNOSIS — Z952 Presence of prosthetic heart valve: Secondary | ICD-10-CM | POA: Diagnosis not present

## 2023-11-30 DIAGNOSIS — K219 Gastro-esophageal reflux disease without esophagitis: Secondary | ICD-10-CM | POA: Diagnosis not present

## 2023-11-30 DIAGNOSIS — D509 Iron deficiency anemia, unspecified: Secondary | ICD-10-CM | POA: Diagnosis not present

## 2023-11-30 LAB — CBC
HCT: 27.7 % — ABNORMAL LOW (ref 39.0–52.0)
Hemoglobin: 7.6 g/dL — ABNORMAL LOW (ref 13.0–17.0)
MCH: 20.9 pg — ABNORMAL LOW (ref 26.0–34.0)
MCHC: 27.4 g/dL — ABNORMAL LOW (ref 30.0–36.0)
MCV: 76.1 fL — ABNORMAL LOW (ref 80.0–100.0)
Platelets: 162 K/uL (ref 150–400)
RBC: 3.64 MIL/uL — ABNORMAL LOW (ref 4.22–5.81)
RDW: 20.4 % — ABNORMAL HIGH (ref 11.5–15.5)
WBC: 2.9 K/uL — ABNORMAL LOW (ref 4.0–10.5)
nRBC: 0 % (ref 0.0–0.2)

## 2023-11-30 LAB — BASIC METABOLIC PANEL WITH GFR
Anion gap: 8 (ref 5–15)
BUN: 6 mg/dL — ABNORMAL LOW (ref 8–23)
CO2: 21 mmol/L — ABNORMAL LOW (ref 22–32)
Calcium: 8.6 mg/dL — ABNORMAL LOW (ref 8.9–10.3)
Chloride: 107 mmol/L (ref 98–111)
Creatinine, Ser: 0.92 mg/dL (ref 0.61–1.24)
GFR, Estimated: >60 mL/min (ref >60)
Glucose, Bld: 86 mg/dL (ref 70–99)
Potassium: 4.2 mmol/L (ref 3.5–5.1)
Sodium: 136 mmol/L (ref 135–145)

## 2023-11-30 LAB — HCV RNA QUANT
HCV Quantitative Log: 4.158 {Log_IU}/mL (ref >1.70)
HCV Quantitative: 14400 [IU]/mL (ref >50)

## 2023-11-30 LAB — HEPATIC FUNCTION PANEL
ALT: 37 U/L (ref 0–44)
AST: 54 U/L — ABNORMAL HIGH (ref 15–41)
Albumin: 3.3 g/dL — ABNORMAL LOW (ref 3.5–5.0)
Alkaline Phosphatase: 93 U/L (ref 38–126)
Bilirubin, Direct: 0.5 mg/dL — ABNORMAL HIGH (ref 0.0–0.2)
Indirect Bilirubin: 0.6 mg/dL (ref 0.3–0.9)
Total Bilirubin: 1.1 mg/dL (ref 0.0–1.2)
Total Protein: 7 g/dL (ref 6.5–8.1)

## 2023-11-30 LAB — AFP TUMOR MARKER: AFP, Serum, Tumor Marker: 2.3 ng/mL (ref 0.0–8.4)

## 2023-11-30 SURGERY — COLONOSCOPY
Anesthesia: Monitor Anesthesia Care

## 2023-11-30 MED ORDER — ADULT MULTIVITAMIN W/MINERALS CH: 1.0000 | ORAL_TABLET | Freq: Every day | ORAL | 1 refills | Status: AC

## 2023-11-30 MED ORDER — PANTOPRAZOLE SODIUM 40 MG PO TBEC: 40.0000 mg | DELAYED_RELEASE_TABLET | Freq: Two times a day (BID) | ORAL | 0 refills | Status: DC

## 2023-11-30 MED ORDER — WARFARIN SODIUM 7.5 MG PO TABS: 7.5000 mg | ORAL_TABLET | Freq: Every day | ORAL | Status: DC

## 2023-11-30 MED ORDER — VITAMIN B-12 1000 MCG PO TABS: 1000.0000 ug | ORAL_TABLET | Freq: Every day | ORAL | 1 refills | Status: DC

## 2023-11-30 MED ORDER — ENOXAPARIN SODIUM 100 MG/ML IJ SOSY: 1.0000 mg/kg | PREFILLED_SYRINGE | Freq: Two times a day (BID) | INTRAMUSCULAR | 0 refills | Status: DC

## 2023-11-30 MED ORDER — LACTATED RINGERS IV SOLN: INTRAVENOUS | Status: DC | PRN

## 2023-11-30 MED ORDER — ENOXAPARIN SODIUM 80 MG/0.8ML IJ SOSY: 1.0000 mg/kg | PREFILLED_SYRINGE | Freq: Two times a day (BID) | INTRAMUSCULAR | 0 refills | Status: DC

## 2023-11-30 MED ORDER — WARFARIN SODIUM 5 MG PO TABS: 5.0000 mg | ORAL_TABLET | Freq: Every day | ORAL | 0 refills | Status: DC

## 2023-11-30 MED ORDER — FERROUS SULFATE 325 (65 FE) MG PO TABS: 325.0000 mg | ORAL_TABLET | Freq: Two times a day (BID) | ORAL | 0 refills | Status: DC

## 2023-11-30 MED ORDER — WARFARIN SODIUM 5 MG PO TABS
8.0000 mg | ORAL_TABLET | Freq: Every day | ORAL | Status: DC
  Filled 2023-11-30: qty 2

## 2023-11-30 MED ORDER — LIDOCAINE 2% (20 MG/ML) 5 ML SYRINGE
INTRAMUSCULAR | Status: DC | PRN
  Administered 2023-11-30: 100 mg via INTRAVENOUS

## 2023-11-30 MED ORDER — WARFARIN - PHARMACIST DOSING INPATIENT: Freq: Every day | Status: DC

## 2023-11-30 MED ORDER — FERROUS SULFATE 325 (65 FE) MG PO TABS: 325.0000 mg | ORAL_TABLET | Freq: Two times a day (BID) | ORAL | Status: DC

## 2023-11-30 MED ORDER — VITAMIN B-1 100 MG PO TABS: 100.0000 mg | ORAL_TABLET | Freq: Every day | ORAL | 0 refills | Status: AC

## 2023-11-30 MED ORDER — SODIUM CHLORIDE 0.9 % IV SOLN
250.0000 mg | Freq: Once | INTRAVENOUS | Status: DC
  Filled 2023-11-30: qty 20

## 2023-11-30 MED ORDER — ENOXAPARIN SODIUM 100 MG/ML IJ SOSY
1.0000 mg/kg | PREFILLED_SYRINGE | Freq: Two times a day (BID) | INTRAMUSCULAR | Status: DC
  Filled 2023-11-30: qty 1

## 2023-11-30 MED ORDER — FOLIC ACID 1 MG PO TABS: 1.0000 mg | ORAL_TABLET | Freq: Every day | ORAL | 0 refills | Status: AC

## 2023-11-30 MED ORDER — WARFARIN SODIUM 4 MG PO TABS: 8.0000 mg | ORAL_TABLET | Freq: Every day | ORAL | 0 refills | Status: AC

## 2023-11-30 MED ORDER — PROPOFOL 500 MG/50ML IV EMUL
INTRAVENOUS | Status: DC | PRN
  Administered 2023-11-30: 200 ug/kg/min via INTRAVENOUS

## 2023-11-30 MED ORDER — PROPOFOL 10 MG/ML IV BOLUS
INTRAVENOUS | Status: DC | PRN
Start: 2023-11-30 — End: 2023-11-30
  Administered 2023-11-30: 100 mg via INTRAVENOUS
  Administered 2023-11-30 (×2): 50 mg via INTRAVENOUS

## 2023-11-30 NOTE — Brief Op Note (Signed)
 Patient underwent Egd and colonoscopy under propofol  sedation.  Tolerated the procedure adequately.   FINDINGS:  EGD   - Normal esophagus. - Erythematous mucosa in the antrum. Biopsied. - Scalloped mucosa was found in the duodenum. Biopsied.  Colonoscopy   - Three 4 to 8 mm polyps in the transverse colon, at the hepatic flexure and in the ascending colon, removed with a cold snare. Resected and retrieved. - The examined portion of the ileum was normal. - Non- bleeding internal hemorrhoids.   RECOMMENDATIONS  -  Check Celiac serologies  - Resume previous diet.  - Await pathology results.  - Repeat colonoscopy in 3 years for surveillance based on pathology results.   - Upper endoscopy and colonoscopy does not explain eitology of severe iron deficiency anemia; hence would recommend outpatient capsule endoscopy   - No absolute GI contraindication to restart clinically indicated ( mitral and aortic valve replacement ) anticoagulation while patient is closely monitored   - IV iron loading and follow up with hematology as outpatient for IDA   - Follow up Hep C RNA level   - Consider outpatient MRI given questionable liver lesion previoulsy especially if HCV RNA is positive   - Spoke to the patient in detail   Dayla Gasca Faizan Ricca Melgarejo, MD Gastroenterology and Hepatology [facility] Gastroenterology

## 2023-11-30 NOTE — Op Note (Signed)
 [facility] Patient Name: Thomas [name] Procedure Date: 11/30/2023 12:33 PM MRN: [id number] Date of Birth: 1960/12/10 Attending MD: Deatrice Dine , MD, [id number] CSN: [id number] Age: 63 Admit Type: Outpatient Procedure:                Colonoscopy Indications:              Unexplained iron deficiency anemia Providers:                Deatrice Dine, MD, Madelin Hunter, RN, [place] Wilson,                            Technician Referring MD:              Medicines:                Monitored Anesthesia Care Complications:            No immediate complications. Estimated Blood Loss:     Estimated blood loss was minimal. Procedure:                Pre-Anesthesia Assessment:                           - Prior to the procedure, a History and Physical                            was performed, and patient medications and                            allergies were reviewed. The patient's tolerance of                            previous anesthesia was also reviewed. The risks                            and benefits of the procedure and the sedation                            options and risks were discussed with the patient.                            All questions were answered, and informed consent                            was obtained. Prior Anticoagulants: The patient has                            taken no anticoagulant or antiplatelet agents. ASA                            Grade Assessment: III - A patient with severe                            systemic disease. After reviewing the risks and  benefits, the patient was deemed in satisfactory                            condition to undergo the procedure.                           After obtaining informed consent, the colonoscope                            was passed under direct vision. Throughout the                            procedure, the patient's blood pressure, pulse, and                            oxygen  saturations were monitored continuously. The                            CF-HQ190L ([id number]) Colon was introduced through                            the anus and advanced to the the terminal ileum.                            The colonoscopy was performed without difficulty.                            The patient tolerated the procedure well. The                            quality of the bowel preparation was evaluated                            using the BBPS [facility] Bowel Preparation Scale)                            with scores of: Right Colon = 2 (minor amount of                            residual staining, small fragments of stool and/or                            opaque liquid, but mucosa seen well), Transverse                            Colon = 2 (minor amount of residual staining, small                            fragments of stool and/or opaque liquid, but mucosa                            seen well) and Left Colon = 2 (minor amount of  residual staining, small fragments of stool and/or                            opaque liquid, but mucosa seen well). The total                            BBPS score equals 6. Scope In: 1:15:10 PM Scope Out: 1:30:23 PM Scope Withdrawal Time: 0 hours 13 minutes 32 seconds  Total Procedure Duration: 0 hours 15 minutes 13 seconds  Findings:      Three sessile polyps were found in the transverse colon, hepatic flexure       and ascending colon. The polyps were 4 to 8 mm in size. These polyps       were removed with a cold snare. Resection and retrieval were complete.      The terminal ileum appeared normal.      Non-bleeding internal hemorrhoids were found during retroflexion. The       hemorrhoids were small. Impression:               - Three 4 to 8 mm polyps in the transverse colon,                            at the hepatic flexure and in the ascending colon,                            removed with a cold snare. Resected and  retrieved.                           - The examined portion of the ileum was normal.                           - Non-bleeding internal hemorrhoids. Moderate Sedation:      Per Anesthesia Care Recommendation:           - Resume previous diet.                           - Continue present medications.                           - Await pathology results.                           - Repeat colonoscopy in 3 years for surveillance                            based on pathology results.                           -Upper endoscopy and colonoscopy does not explain                            eitology of severe iron deficiency anemia; hence                            could recommend outpatient capsule endoscopy                           -  No absolute GI contraindication to restart                            clinically indicated (mitral and aortic valve                            replacement ) anticoagulation while patient is                            closely monitored                           -IV iron loading and follow up with hematology as                            outpatient for IDA                           -Follow up Hep C RNA level                           -Consider outpatient MRI given questionable liver                            lesion previoulsy especially if HCV RNA is positive                           -Spoke to the patient in detail Procedure Code(s):        --- Professional ---                           [phone], Colonoscopy, flexible; with removal of                            tumor(s), polyp(s), or other lesion(s) by snare                            technique Diagnosis Code(s):        --- Professional ---                           D12.3, Benign neoplasm of transverse colon (hepatic                            flexure or splenic flexure)                           D12.2, Benign neoplasm of ascending colon                           K64.8, Other hemorrhoids                            D50.9, Iron deficiency anemia, unspecified CPT copyright 2022 American Medical Association. All rights reserved. The codes documented in this report are preliminary and upon coder review may  be revised to meet current  compliance requirements. Deatrice Dine, MD Deatrice Dine, MD 11/30/2023 2:30:57 PM This report has been signed electronically. Number of Addenda: 0

## 2023-11-30 NOTE — Plan of Care (Signed)

## 2023-11-30 NOTE — Op Note (Signed)
 [facility] Patient Name: Thomas [name] Procedure Date: 11/30/2023 12:36 PM MRN: [id number] Date of Birth: Oct 07, 1960 Attending MD: Deatrice Dine , MD, [id number] CSN: [id number] Age: 62 Admit Type: Outpatient Procedure:                Upper GI endoscopy Indications:              Unexplained iron deficiency anemia Providers:                Deatrice Dine, MD, Madelin Hunter, RN, [place] Wilson,                            Technician Referring MD:              Medicines:                Monitored Anesthesia Care Complications:            No immediate complications. Estimated Blood Loss:     Estimated blood loss was minimal. Procedure:                Pre-Anesthesia Assessment:                           - Prior to the procedure, a History and Physical                            was performed, and patient medications and                            allergies were reviewed. The patient's tolerance of                            previous anesthesia was also reviewed. The risks                            and benefits of the procedure and the sedation                            options and risks were discussed with the patient.                            All questions were answered, and informed consent                            was obtained. Prior Anticoagulants: The patient has                            taken no anticoagulant or antiplatelet agents. ASA                            Grade Assessment: III - A patient with severe                            systemic disease. After reviewing the risks and  benefits, the patient was deemed in satisfactory                            condition to undergo the procedure.                           After obtaining informed consent, the endoscope was                            passed under direct vision. Throughout the                            procedure, the patient's blood pressure, pulse, and                             oxygen saturations were monitored continuously. The                            [id number]-[id number] ([id number]) Upper was introduced through                            the mouth, and advanced to the second part of                            duodenum. The upper GI endoscopy was accomplished                            without difficulty. The patient tolerated the                            procedure well. Scope In: 1:06:47 PM Scope Out: 1:11:16 PM Total Procedure Duration: 0 hours 4 minutes 29 seconds  Findings:      The examined esophagus was normal.      Mildly erythematous mucosa without bleeding was found in the gastric       antrum. This was biopsied with a cold forceps for histology.      Mildly scalloped mucosa was found in the second portion of the duodenum.       Biopsies were taken with a cold forceps for histology. Impression:               - Normal esophagus.                           - Erythematous mucosa in the antrum. Biopsied.                           - Scalloped mucosa was found in the duodenum.                            Biopsied. Moderate Sedation:      Per Anesthesia Care Recommendation:           - Await pathology results.                           Check Celiac serologies  Proceed with colonoscopy Procedure Code(s):        --- Professional ---                           [phone], Esophagogastroduodenoscopy, flexible,                            transoral; with biopsy, single or multiple Diagnosis Code(s):        --- Professional ---                           K31.89, Other diseases of stomach and duodenum                           D50.9, Iron deficiency anemia, unspecified CPT copyright 2022 American Medical Association. All rights reserved. The codes documented in this report are preliminary and upon coder review may  be revised to meet current compliance requirements. Deatrice Dine, MD Deatrice Dine, MD 11/30/2023 2:11:15 PM This report has been signed  electronically. Number of Addenda: 0

## 2023-11-30 NOTE — Progress Notes (Signed)
 Progress Note  Patient Name: Thomas [name] [facility] Date of Encounter: 11/30/2023  Primary Cardiologist: Ozell Fell, MD  Subjective   No events.  Stable.  Inpatient Medications    Scheduled Meds:  folic acid   1 mg Oral Daily   multivitamin with minerals  1 tablet Oral Daily   pantoprazole  (PROTONIX ) IV  40 mg Intravenous Q12H   thiamine   100 mg Oral Daily   Or   thiamine   100 mg Intravenous Daily   Continuous Infusions:  sodium chloride      PRN Meds: acetaminophen  **OR** acetaminophen , ALPRAZolam , diphenhydrAMINE , LORazepam  **OR** LORazepam , ondansetron  **OR** ondansetron  (ZOFRAN ) IV, polyethylene glycol   Vital Signs    Vitals:   11/29/23 1006 11/29/23 1444 11/29/23 1948 11/30/23 0532  BP: (!) 152/73 (!) 115/56 136/73 118/63  Pulse: 80 84 79 78  Resp: 17 18 14 18   Temp: 98.4 F (36.9 C) 98.3 F (36.8 C) 98.4 F (36.9 C) 98.9 F (37.2 C)  TempSrc:   Oral Oral  SpO2: 99% 100% 97% 95%  Weight:      Height:        Intake/Output Summary (Last 24 hours) at 11/30/2023 1055 Last data filed at 11/29/2023 1700 Gross per 24 hour  Intake 840 ml  Output --  Net 840 ml   Filed Weights   11/28/23 1030  Weight: 90.3 kg    ECG    Not performed.  Physical Exam   GEN: No acute distress.   Neck: No JVD. Cardiac: RRR, no murmur, rub, or gallop.  Respiratory: Nonlabored. Clear to auscultation bilaterally. GI: Soft, nontender, bowel sounds present. MS: No edema; No deformity. Neuro:  Nonfocal. Psych: Alert and oriented x 3. Normal affect.  Labs    Chemistry Recent Labs  Lab 11/28/23 1053 11/29/23 0258 11/30/23 0424  NA 138 137 136  K 3.9 4.0 4.2  CL 107 109 107  CO2 21* 21* 21*  GLUCOSE 89 91 86  BUN 7* 6* 6*  CREATININE 0.86 0.76 0.92  CALCIUM  8.6* 8.4* 8.6*  PROT 7.7  --  7.0  ALBUMIN  3.5  --  3.3*  AST 73*  --  54*  ALT 47*  --  37  ALKPHOS 107  --  93  BILITOT 1.1  --  1.1  GFRNONAA >60 >60 >60  ANIONGAP 10 8 8      Hematology Recent  Labs  Lab 11/28/23 1053 11/28/23 1250 11/29/23 0258 11/29/23 1126 11/30/23 0424  WBC 3.5*  --  2.8*  --  2.9*  RBC 3.42* 3.29* 3.51*  --  3.64*  HGB 6.8*  --  7.3* 7.9* 7.6*  HCT 25.6*  --  26.5* 28.3* 27.7*  MCV 74.9*  --  75.5*  --  76.1*  MCH 19.9*  --  20.8*  --  20.9*  MCHC 26.6*  --  27.5*  --  27.4*  RDW 20.4*  --  20.1*  --  20.4*  PLT 171  --  159  --  162    Cardiac EnzymesNo results for input(s): TROPONINIHS in the last 720 hours.  BNPNo results for input(s): BNP, PROBNP in the last 168 hours.   DDimerNo results for input(s): DDIMER in the last 168 hours.    Assessment & Plan    Preop cardiac risk stratification for EGD Severe iron deficiency anemia - Asymptomatic.  Denied having any angina, DOE, dizziness/lightheadedness, syncope or leg swelling. - Hb baseline has been around 8 since 12/2022.  Never had any bleeding in the  last 6 months. - EKG still pending, telemetry showed NSR. - Low risk for any perioperative cardiac complications. - Echocardiogram this admission is unremarkable.  Gradients across the mitral valve and aortic valve prosthesis are stable.   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  He did not have any active bleeding at that time nor now.  He is n.p.o. today for scope.  Start Coumadin  after the procedure, needs subcutaneous Lovenox  bridging.   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   [facility] will sign off.   Medication Recommendations: Start Coumadin  after the scope.  Needs subcutaneous Lovenox  bridging.  Continue remaining home medications. Other recommendations (labs, testing, etc): None Follow up as an outpatient: Keep regular scheduled follow-up appointment with cardiology.  30 minutes spent in reviewing the prior records, tests, reports, more than 3 labs,  discussion of the above problems with the patient, specialist teams, documentation.  Answered his questions.  Signed, Diannah SHAUNNA Maywood, MD  11/30/2023, 10:55 AM

## 2023-11-30 NOTE — Progress Notes (Signed)
 PHARMACY - ANTICOAGULATION CONSULT NOTE  Pharmacy Consult for lovenox >warfarin Indication: AVR/MVR  Allergies  Allergen Reactions   Hydrocodone  Rash    Patient Measurements: Height: 5' 11 (180.3 cm) Weight: 90.3 kg (199 lb) IBW/kg (Calculated) : 75.3 HEPARIN  DW (KG): 90.3  Vital Signs: Temp: 97.8 F (36.6 C) (10/03 1410) Temp Source: Oral (10/03 1217) BP: 154/81 (10/03 1400) Pulse Rate: 68 (10/03 1410)  Labs: Recent Labs    11/28/23 1053 11/29/23 0258 11/29/23 1126 11/30/23 0424  HGB 6.8* 7.3* 7.9* 7.6*  HCT 25.6* 26.5* 28.3* 27.7*  PLT 171 159  --  162  LABPROT 16.2*  --   --   --   INR 1.2  --   --   --   CREATININE 0.86 0.76  --  0.92    Estimated Creatinine Clearance: 87.5 mL/min (by C-G formula based on SCr of 0.92 mg/dL).   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 No obvious source of bleeding. GI recommending outpatient capsule endoscopy. Patient has been off anticoagulation for quite some time, given valvular history will bridge with lovenox  and warfarin.   Goal of Therapy:  INR goal 2.5-3.5 Monitor platelets by anticoagulation protocol: Yes   Plan:  Lovenox  90mg  q12 hours Warfarin 5mg  at discharge, cardiology is arranging follow up  Dempsey Blush PharmD., BCPS Clinical Pharmacist 11/30/2023 3:23 PM

## 2023-11-30 NOTE — Discharge Summary (Addendum)
 Physician Discharge Summary   Patient: Thomas [name] MRN: [id number] DOB: [birth year]  Admit date:     11/28/2023  Discharge date: 11/30/23  Discharge Physician: Adriana DELENA Grams   PCP: Marvine Rush, MD   Recommendations at discharge:  -Follow-up with a gastroenterologist within 1 week for capsule endoscopy -Follow-up with PCP in 1 week -Monitor your INR level every 3-5-7 days (with INR goal of 2.5-3.5)  -Your Coumadin  level will be adjusted accordingly to reach the INR goal -Continue taking Lovenox  injection subcutaneously until INR reaches goal of 2.5 -Avoid additional use of NSAIDs such as Goody powder, ibuprofen , aspirin , naproxen.SABRA Rebel. -Continue iron supplements-May need outpatient iron infusion -CBC within 1 weeks results to PCP and gastroenterologist  Discharge Diagnoses: Principal Problem:   Symptomatic anemia Active Problems:   Alcohol abuse   Essential hypertension   S/P AVR (aortic valve replacement)   S/P MVR (mitral valve replacement)   Long term (current) use of anticoagulants   Iron deficiency anemia due to chronic blood loss   Gastritis and gastroduodenitis   Adenomatous polyp of ascending colon  Resolved Problems:   * No resolved hospital problems. *  Hospital Course:  63 y.o. male with medical history significant for diastolic CHF, alcohol abuse, pancytopenia, bacterial endocarditis, pericardial effusion, status post aortic and mitral valve repair.  He has been off of his warfarin for a month.  He went to his outpatient provider with complaints of generalized itching.  Blood work was done which revealed a low hemoglobin so he was asked by his PCP to come to the emergency department.  Hemoglobin noted to be 6.8.  Patient was hospitalized for further management.    Microcytic anemia Hemoglobin was as low as 6.8.  He was transfused 1 unit of PRBC.  Hemoglobin noted to be 7.6 this morning.    Latest Ref Rng & Units 11/30/2023    4:24 AM 11/29/2023   11:26  AM 11/29/2023    2:58 AM  CBC  WBC 4.0 - 10.5 K/uL 2.9   2.8   Hemoglobin 13.0 - 17.0 g/dL 7.6  7.9  7.3   Hematocrit 39.0 - 52.0 % 27.7  28.3  26.5   Platelets 150 - 400 K/uL 162   159    Iron/TIBC/Ferritin/ %Sat    Component Value Date/Time   IRON 22 (L) 11/28/2023 1250   TIBC 507 (H) 11/28/2023 1250   FERRITIN 24 11/28/2023 1250   IRONPCTSAT 4 (L) 11/28/2023 1250    -   Folic acid  6.8.  Vitamin B12 309.  There is concern for iron deficiency.  - Started oral iron supplements, offered iron infusion  11/30/2023 GI consulted: S/p EGD/colonoscopy, no specific finding, no signs of bleeding, no concern for overt cancer. Patient to follow-up as an outpatient for capsule endoscopy Cleared from GI to start anticoagulation  GI has been consulted... Anticipating colonoscopy:  - Due to history of mitral and aortic valve replacement although he has been noncompliant recently. Patient is prescribed Coumadin  -dose to be adjusted with INR goal of 2.5-3.5 Patient is prescribed Lovenox  subcu 90 mg twice daily to her INR reaches 2.5  -Protonix  40 mg twice daily (to reduce any risk of GI bleed for now   History of endocarditis status post aortic and mitral valve replacement Followed by cardiology.  Underwent the surgery earlier this year.  Was also followed by cardiothoracic surgery. He supposed be on warfarin but has not been on it for a month. Cleared by GI to resume  anticoagulation Resuming Coumadin  with Lovenox  bridging     Alcohol abuse/Hep C He was counseled about his significant alcohol consumption especially in view of the fact that he is on anticoagulation. CIWA protocol.  Thiamine  multivitamins folic acid . Elevated AST/ALt noted.  Improved,  Hep C positive results noted from 2023. Follow-up with GI   Borderline low B12 level Will benefit from B12 supplementation. B12 supplements   Essential hypertension Not on any antihypertensives.   Blood pressure remains stable. No  medications needed   Chronic diastolic CHF Echocardiogram from March 2024 showed normal LVEF.  Currently well compensated.  Not on diuretics prior to admission.  Continue to [facility]   Assessment and Plan: No notes have been filed under this hospital service. Service: Hospitalist        Consultants: Gastroenterology/cardiology Procedures performed: EGD/colonoscopy Disposition: Home Diet recommendation:  Discharge Diet Orders (From admission, onward)     Start     Ordered   11/30/23 0000  Diet - low sodium heart healthy        11/30/23 1431           Clear liquid diet, advance as tolerated DISCHARGE MEDICATION: Allergies as of 11/30/2023       Reactions   Hydrocodone  Rash        Medication List     STOP taking these medications    GOODYS EXTRA STRENGTH PO   omeprazole 20 MG tablet Commonly known as: PRILOSEC OTC       TAKE these medications    acetaminophen  325 MG tablet Commonly known as: TYLENOL  Take 2 tablets (650 mg total) by mouth every 4 (four) hours as needed for mild pain (pain score 1-3) or fever.   ALPRAZolam  1 MG tablet Commonly known as: XANAX  Take 2 mg by mouth at bedtime.   cyanocobalamin 1000 MCG tablet Commonly known as: VITAMIN B12 Take 1 tablet (1,000 mcg total) by mouth daily.   enoxaparin  100 MG/ML injection Commonly known as: LOVENOX  Inject 0.9 mLs (90 mg total) into the skin every 12 (twelve) hours.   ferrous sulfate 325 (65 FE) MG tablet Take 1 tablet (325 mg total) by mouth 2 (two) times daily with a meal.   folic acid  1 MG tablet Commonly known as: FOLVITE  Take 1 tablet (1 mg total) by mouth daily for 5 days. Start taking on: December 01, 2023   magnesium  oxide 400 (240 Mg) MG tablet Commonly known as: MAG-OX Take 1 tablet (400 mg total) by mouth 2 (two) times daily.   multivitamin with minerals Tabs tablet Take 1 tablet by mouth daily. Start taking on: December 01, 2023   pantoprazole  40 MG tablet Commonly known  as: Protonix  Take 1 tablet (40 mg total) by mouth 2 (two) times daily.   potassium chloride  SA 20 MEQ tablet Commonly known as: KLOR-CON  M TAKE 1 TABLET BY MOUTH TWICE A DAY   thiamine  100 MG tablet Commonly known as: Vitamin B-1 Take 1 tablet (100 mg total) by mouth daily for 5 days. Start taking on: December 01, 2023   warfarin 4 MG tablet Commonly known as: COUMADIN  Take as directed. If you are unsure how to take this medication, talk to your nurse or doctor. Original instructions: Take 2 tablets (8 mg total) by mouth daily at 4 PM.        Follow-up Information     Rourk, Lamar HERO, MD.   Specialty: Gastroenterology Contact information: [address] [place] [address] [phone]  Discharge Exam: Filed Weights   11/28/23 1030 11/30/23 1217  Weight: 90.3 kg 90.3 kg        General:  AAO x 3,  cooperative, no distress;   HEENT:  Normocephalic, PERRL, otherwise with in Normal limits   Neuro:  CNII-XII intact. , normal motor and sensation, reflexes intact   Lungs:   Clear to auscultation BL, Respirations unlabored,  No wheezes / crackles  Cardio:    S1/S2, RRR, No murmure, No Rubs or Gallops   Abdomen:  Soft, non-tender, bowel sounds active all four quadrants, no guarding or peritoneal signs.  Muscular  skeletal:  Limited exam -global generalized weaknesses - in bed, able to move all 4 extremities,   2+ pulses,  symmetric, No pitting edema  Skin:  Dry, warm to touch, negative for any Rashes,  Wounds: Please see nursing documentation          Condition at discharge: good  The results of significant diagnostics from this hospitalization (including imaging, microbiology, ancillary and laboratory) are listed below for reference.   Imaging Studies: US  Abdomen Limited RUQ (LIVER/GB) Result Date: 11/29/2023 CLINICAL DATA:  221910 Elevated LFTs 221910 EXAM: ULTRASOUND ABDOMEN LIMITED RIGHT UPPER QUADRANT COMPARISON:  December 20, 2021  FINDINGS: Gallbladder: No gallstones or wall thickening visualized. No sonographic Murphy sign noted by sonographer. Common bile duct: Diameter: Visualized portion measures 5 mm, within normal limits. Liver: There is a benign 9 mm cyst noted in the LEFT liver. Mildly increased in parenchymal echogenicity. Portal vein is patent on color Doppler imaging with normal direction of blood flow towards the liver. Other: None. IMPRESSION: Mildly increased hepatic echogenicity as can be seen in hepatic steatosis or underlying hepatocellular disease. Electronically Signed   By: Corean Salter M.D.   On: 11/29/2023 16:31   ECHOCARDIOGRAM COMPLETE Result Date: 11/29/2023    ECHOCARDIOGRAM REPORT   Patient Name:   TOMOKI LUCKEN [facility] Date of Exam: 11/29/2023 Medical Rec #:  [id number]         Height:       71.0 in Accession #:    [id number]        Weight:       199.0 lb Date of Birth:  1960-03-10         BSA:          2.104 m Patient Age:    63 years          BP:           152/73 mmHg Patient Gender: M                 HR:           80 bpm. Exam Location:  [facility] Procedure: 2D Echo, Cardiac Doppler and Color Doppler (Both Spectral and Color            Flow Doppler were utilized during procedure). Indications:    S/P Aortic Valve replacement Z95.2                 S/P Mitral Valve replacement Z95.2  History:        Patient has prior history of Echocardiogram examinations, most                 recent 05/28/2023. CHF; Risk Factors:Hypertension. Hx of ETOH                 abuse. S/P Aortic Valve & Mitral Valve replacement. Long term                 (  current) use of anticoagulants.                  Aortic Valve: 21 mm On-x mechanical valve is present in the                 aortic position. Procedure Date: 03/16/2023.                 Mitral Valve: 31/33 On-x mechanical valve is present in the                 mitral position. Procedure Date: 03/16/2023.  Sonographer:    Aida Pizza RCS Referring Phys: [id number] LAYMON CHRISTELLA QUA  IMPRESSIONS  1. Left ventricular ejection fraction, by estimation, is 60 to 65%. The left ventricle has normal function. The left ventricle has no regional wall motion abnormalities. There is moderate left ventricular hypertrophy. Left ventricular diastolic function  could not be evaluated.  2. Right ventricular systolic function is normal. The right ventricular size is normal. There is normal pulmonary artery systolic pressure.  3. Left atrial size was mildly dilated.  4. Right atrial size was mildly dilated.  5. The mitral valve has been repaired/replaced. No evidence of mitral valve regurgitation. Mild mitral stenosis. The mean mitral valve gradient is 5.0 mmHg. Prior mitral mean PG was 9 mm Hg Gradients improved. There is a 31/33 On-x mechanical present in  the mitral position. Procedure Date: 03/16/2023.  6. The aortic valve has been repaired/replaced. Aortic valve regurgitation is not visualized. No aortic stenosis is present. There is a 21 mm On-x mechanical valve present in the aortic position. Procedure Date: 03/16/2023. Aortic valve mean gradient measures 17.0 mmHg. Prior gradient was 15 mm Hg. Stable gradients.  7. The inferior vena cava is normal in size with greater than 50% respiratory variability, suggesting right atrial pressure of 3 mmHg.  8. Increased flow velocities may be secondary to anemia, thyrotoxicosis, hyperdynamic or high flow state. Comparison(s): No significant change from prior study. FINDINGS  Left Ventricle: Left ventricular ejection fraction, by estimation, is 60 to 65%. The left ventricle has normal function. The left ventricle has no regional wall motion abnormalities. Strain was performed and the global longitudinal strain is indeterminate. The left ventricular internal cavity size was normal in size. There is moderate left ventricular hypertrophy. Left ventricular diastolic function could not be evaluated due to mitral valve replacement. Left ventricular diastolic function could  not be evaluated. Right Ventricle: The right ventricular size is normal. No increase in right ventricular wall thickness. Right ventricular systolic function is normal. There is normal pulmonary artery systolic pressure. The tricuspid regurgitant velocity is 2.69 m/s, and  with an assumed right atrial pressure of 3 mmHg, the estimated right ventricular systolic pressure is 31.9 mmHg. Left Atrium: Left atrial size was mildly dilated. Right Atrium: Right atrial size was mildly dilated. Pericardium: There is no evidence of pericardial effusion. Mitral Valve: The mitral valve has been repaired/replaced. No evidence of mitral valve regurgitation. There is a 31/33 On-x mechanical present in the mitral position. Procedure Date: 03/16/2023. Mild mitral valve stenosis. MV peak gradient, 14.7 mmHg. The mean mitral valve gradient is 5.0 mmHg. Tricuspid Valve: The tricuspid valve is normal in structure. Tricuspid valve regurgitation is mild . No evidence of tricuspid stenosis. Aortic Valve: The aortic valve has been repaired/replaced. Aortic valve regurgitation is not visualized. No aortic stenosis is present. Aortic valve mean gradient measures 17.0 mmHg. Aortic valve peak gradient measures 31.1 mmHg. Aortic valve area, by VTI  measures 0.93 cm. There is a 21 mm On-x mechanical valve present in the aortic position. Procedure Date: 03/16/2023. Pulmonic Valve: The pulmonic valve was not well visualized. Pulmonic valve regurgitation is trivial. No evidence of pulmonic stenosis. Aorta: The aortic root is normal in size and structure. Venous: The inferior vena cava is normal in size with greater than 50% respiratory variability, suggesting right atrial pressure of 3 mmHg. IAS/Shunts: No atrial level shunt detected by color flow Doppler. Additional Comments: 3D was performed not requiring image post processing on an independent workstation and was indeterminate.  LEFT VENTRICLE PLAX 2D LVIDd:         4.50 cm   Diastology LVIDs:          2.30 cm   LV e' medial:    9.45 cm/s LV PW:         1.30 cm   LV E/e' medial:  19.4 LV IVS:        1.40 cm   LV e' lateral:   11.65 cm/s LVOT diam:     1.50 cm   LV E/e' lateral: 15.7 LV SV:         53 LV SV Index:   25 LVOT Area:     1.77 cm  RIGHT VENTRICLE RV S prime:     10.90 cm/s TAPSE (M-mode): 2.3 cm LEFT ATRIUM             Index        RIGHT ATRIUM           Index LA diam:        3.85 cm 1.83 cm/m   RA Area:     23.30 cm LA Vol (A2C):   68.7 ml 32.65 ml/m  RA Volume:   73.60 ml  34.98 ml/m LA Vol (A4C):   71.7 ml 34.08 ml/m LA Biplane Vol: 73.9 ml 35.12 ml/m  AORTIC VALVE AV Area (Vmax):    0.90 cm AV Area (Vmean):   0.87 cm AV Area (VTI):     0.93 cm AV Vmax:           279.00 cm/s AV Vmean:          193.000 cm/s AV VTI:            0.574 m AV Peak Grad:      31.1 mmHg AV Mean Grad:      17.0 mmHg LVOT Vmax:         142.00 cm/s LVOT Vmean:        94.700 cm/s LVOT VTI:          0.302 m LVOT/AV VTI ratio: 0.53  AORTA Ao Root diam: 3.40 cm MITRAL VALVE                TRICUSPID VALVE MV Area (PHT): 2.29 cm     TR Peak grad:   28.9 mmHg MV Area VTI:   1.19 cm     TR Vmax:        269.00 cm/s MV Peak grad:  14.7 mmHg MV Mean grad:  5.0 mmHg     SHUNTS MV Vmax:       1.92 m/s     Systemic VTI:  0.30 m MV Vmean:      103.0 cm/s   Systemic Diam: 1.50 cm MV Decel Time: 331 msec MV E velocity: 183.00 cm/s MV A velocity: 83.60 cm/s MV E/A ratio:  2.19 Vishnu Priya Mallipeddi Electronically signed by Diannah Late Mallipeddi Signature Date/Time:  11/29/2023/3:58:21 PM    Final    DG Forearm Right Result Date: 11/07/2023 CLINICAL DATA:  pain s/p fall. EXAM: RIGHT FOREARM - 2 VIEW COMPARISON:  04/30/2023. FINDINGS: No acute fracture or dislocation. No aggressive osseous lesion. Distal right ulnar ORIF noted, without significant interval change since the prior study. Mild diffuse degenerative changes of imaged joints. No radiopaque foreign bodies. Soft tissues are within normal limits. IMPRESSION: No acute  osseous abnormality of the right forearm. Electronically Signed   By: Ree Molt M.D.   On: 11/07/2023 15:33    Microbiology: Results for orders placed or performed during the hospital encounter of 03/08/23  Resp panel by RT-PCR (RSV, Flu A&B, Covid) Anterior Nasal Swab     Status: None   Collection Time: 03/08/23  8:20 AM   Specimen: Anterior Nasal Swab  Result Value Ref Range Status   SARS Coronavirus 2 by RT PCR NEGATIVE NEGATIVE Final    Comment: (NOTE) SARS-CoV-2 target nucleic acids are NOT DETECTED.  The SARS-CoV-2 RNA is generally detectable in upper respiratory specimens during the acute phase of infection. The lowest concentration of SARS-CoV-2 viral copies this assay can detect is 138 copies/mL. A negative result does not preclude SARS-Cov-2 infection and should not be used as the sole basis for treatment or other patient management decisions. A negative result may occur with  improper specimen collection/handling, submission of specimen other than nasopharyngeal swab, presence of viral mutation(s) within the areas targeted by this assay, and inadequate number of viral copies(<138 copies/mL). A negative result must be combined with clinical observations, patient history, and epidemiological information. The expected result is Negative.  Fact Sheet for Patients:  BloggerCourse.com  Fact Sheet for Healthcare Providers:  SeriousBroker.it  This test is no t yet approved or cleared by the [place]  FDA and  has been authorized for detection and/or diagnosis of SARS-CoV-2 by FDA under an Emergency Use Authorization (EUA). This EUA will remain  in effect (meaning this test can be used) for the duration of the COVID-19 declaration under Section 564(b)(1) of the Act, 21 U.S.C.section 360bbb-3(b)(1), unless the authorization is terminated  or revoked sooner.       Influenza A by PCR NEGATIVE NEGATIVE Final    Influenza B by PCR NEGATIVE NEGATIVE Final    Comment: (NOTE) The Xpert Xpress SARS-CoV-2/FLU/RSV plus assay is intended as an aid in the diagnosis of influenza from Nasopharyngeal swab specimens and should not be used as a sole basis for treatment. Nasal washings and aspirates are unacceptable for Xpert Xpress SARS-CoV-2/FLU/RSV testing.  Fact Sheet for Patients: BloggerCourse.com  Fact Sheet for Healthcare Providers: SeriousBroker.it  This test is not yet approved or cleared by the [place]  FDA and has been authorized for detection and/or diagnosis of SARS-CoV-2 by FDA under an Emergency Use Authorization (EUA). This EUA will remain in effect (meaning this test can be used) for the duration of the COVID-19 declaration under Section 564(b)(1) of the Act, 21 U.S.C. section 360bbb-3(b)(1), unless the authorization is terminated or revoked.     Resp Syncytial Virus by PCR NEGATIVE NEGATIVE Final    Comment: (NOTE) Fact Sheet for Patients: BloggerCourse.com  Fact Sheet for Healthcare Providers: SeriousBroker.it  This test is not yet approved or cleared by the [place]  FDA and has been authorized for detection and/or diagnosis of SARS-CoV-2 by FDA under an Emergency Use Authorization (EUA). This EUA will remain in effect (meaning this test can be used) for the duration of the COVID-19 declaration under Section  564(b)(1) of the Act, 21 U.S.C. section 360bbb-3(b)(1), unless the authorization is terminated or revoked.  Performed at [facility], [address] [place], [place] [address]   Culture, blood (Routine X 2) w Reflex to ID Panel     Status: Abnormal   Collection Time: 03/10/23 10:57 AM   Specimen: BLOOD  Result Value Ref Range Status   Specimen Description   Final    BLOOD BLOOD LEFT FOREARM Performed at [facility], [address] [place], [place] [address]     Special Requests   Final    BOTTLES DRAWN AEROBIC AND ANAEROBIC Blood Culture adequate volume Performed at [facility], [address], [place], [place] [address]    Culture  Setup Time   Final    GRAM POSITIVE COCCI ANAEROBIC BOTTLE ONLY Gram Stain Report Called to,Read Back By and Verified With: N.DORMON ON  03/11/2023 @10 :19 BY T.HAMER CRITICAL RESULT CALLED TO, READ BACK BY AND VERIFIED WITH: PHARMD J LEFDFORD 03/12/2023 @ 0142 BY AB Performed at [facility], [address], [place], [place] [address]    Culture STREPTOCOCCUS INFANTARIUS (A)  Final   Report Status 03/14/2023 FINAL  Final   Organism ID, Bacteria STREPTOCOCCUS INFANTARIUS  Final      Susceptibility   Streptococcus infantarius - MIC*    ERYTHROMYCIN <=0.12 SENSITIVE Sensitive     TETRACYCLINE <=0.25 SENSITIVE Sensitive     VANCOMYCIN  0.25 SENSITIVE Sensitive     CLINDAMYCIN <=0.25 SENSITIVE Sensitive     PENICILLIN  <=0.06      CEFTAZIDIME <=0.12      * STREPTOCOCCUS INFANTARIUS  Culture, blood (Routine X 2) w Reflex to ID Panel     Status: None   Collection Time: 03/10/23 10:57 AM   Specimen: BLOOD  Result Value Ref Range Status   Specimen Description BLOOD BLOOD LEFT HAND  Final   Special Requests   Final    BOTTLES DRAWN AEROBIC AND ANAEROBIC Blood Culture adequate volume   Culture   Final    NO GROWTH 5 DAYS Performed at [facility], [address], [place], [place] [address]    Report Status 03/15/2023 FINAL  Final  Blood Culture ID Panel (Reflexed)     Status: Abnormal   Collection Time: 03/10/23 10:57 AM  Result Value Ref Range Status   Enterococcus faecalis NOT DETECTED NOT DETECTED Final   Enterococcus Faecium NOT DETECTED NOT DETECTED Final   Listeria monocytogenes NOT DETECTED NOT DETECTED Final   Staphylococcus species NOT DETECTED NOT DETECTED Final   Staphylococcus aureus (BCID) NOT DETECTED NOT DETECTED Final   Staphylococcus epidermidis NOT DETECTED NOT DETECTED Final    Staphylococcus lugdunensis NOT DETECTED NOT DETECTED Final   Streptococcus species DETECTED (A) NOT DETECTED Final    Comment: Not Enterococcus species, Streptococcus agalactiae, Streptococcus pyogenes, or Streptococcus pneumoniae. CRITICAL RESULT CALLED TO, READ BACK BY AND VERIFIED WITH: PHARMD J LEFDFORD 03/12/2023 @ 0142 BY AB    Streptococcus agalactiae NOT DETECTED NOT DETECTED Final   Streptococcus pneumoniae NOT DETECTED NOT DETECTED Final   Streptococcus pyogenes NOT DETECTED NOT DETECTED Final   A.calcoaceticus-baumannii NOT DETECTED NOT DETECTED Final   Bacteroides fragilis NOT DETECTED NOT DETECTED Final   Enterobacterales NOT DETECTED NOT DETECTED Final   Enterobacter cloacae complex NOT DETECTED NOT DETECTED Final   Escherichia coli NOT DETECTED NOT DETECTED Final   Klebsiella aerogenes NOT DETECTED NOT DETECTED Final   Klebsiella oxytoca NOT DETECTED NOT DETECTED Final   Klebsiella pneumoniae NOT DETECTED NOT  DETECTED Final   Proteus species NOT DETECTED NOT DETECTED Final   Salmonella species NOT DETECTED NOT DETECTED Final   Serratia marcescens NOT DETECTED NOT DETECTED Final   Haemophilus influenzae NOT DETECTED NOT DETECTED Final   Neisseria meningitidis NOT DETECTED NOT DETECTED Final   Pseudomonas aeruginosa NOT DETECTED NOT DETECTED Final   Stenotrophomonas maltophilia NOT DETECTED NOT DETECTED Final   Candida albicans NOT DETECTED NOT DETECTED Final   Candida auris NOT DETECTED NOT DETECTED Final   Candida glabrata NOT DETECTED NOT DETECTED Final   Candida krusei NOT DETECTED NOT DETECTED Final   Candida parapsilosis NOT DETECTED NOT DETECTED Final   Candida tropicalis NOT DETECTED NOT DETECTED Final   Cryptococcus neoformans/gattii NOT DETECTED NOT DETECTED Final    Comment: Performed at [facility], [address], [place], [place] [address]  Culture, blood (Routine X 2) w Reflex to ID Panel     Status: None   Collection Time: 03/11/23  8:53 PM    Specimen: BLOOD  Result Value Ref Range Status   Specimen Description BLOOD BLOOD LEFT HAND  Final   Special Requests   Final    BOTTLES DRAWN AEROBIC AND ANAEROBIC Blood Culture results may not be optimal due to an inadequate volume of blood received in culture bottles   Culture   Final    NO GROWTH 5 DAYS Performed at [facility], [address] [place], [place] [address]    Report Status 03/16/2023 FINAL  Final  Culture, blood (Routine X 2) w Reflex to ID Panel     Status: None   Collection Time: 03/11/23  8:53 PM   Specimen: BLOOD  Result Value Ref Range Status   Specimen Description BLOOD BLOOD LEFT ARM  Final   Special Requests   Final    BOTTLES DRAWN AEROBIC AND ANAEROBIC Blood Culture results may not be optimal due to an inadequate volume of blood received in culture bottles   Culture   Final    NO GROWTH 5 DAYS Performed at [facility], [address], [place], [place] [address]    Report Status 03/16/2023 FINAL  Final  Surgical pcr screen     Status: None   Collection Time: 03/16/23  4:59 AM   Specimen: Nasal Mucosa; Nasal Swab  Result Value Ref Range Status   MRSA, PCR NEGATIVE NEGATIVE Final   Staphylococcus aureus NEGATIVE NEGATIVE Final    Comment: (NOTE) The Xpert SA Assay (FDA approved for NASAL specimens in patients 53 years of age and older), is one component of a comprehensive surveillance program. It is not intended to diagnose infection nor to guide or monitor treatment. Performed at [facility], [address] [place], [place] [address]   Fungus Culture With Stain     Status: None   Collection Time: 03/16/23  9:24 AM   Specimen: Mitral Valve Leaflets; Tissue  Result Value Ref Range Status   Fungus Stain Final report  Final   Fungus (Mycology) Culture Final report  Final    Comment: (NOTE) Performed At: [facility] [address] [place], [place] [id number] Jennette Shorter MD Ey:[id number]    Fungal Source VALVE   Final    Comment: Performed at [facility], [address], [place], [place] [address]  Aerobic/Anaerobic Culture w Gram Stain (surgical/deep wound)     Status: None   Collection Time: 03/16/23  9:24 AM   Specimen: Mitral Valve Leaflets; Tissue  Result  Value Ref Range Status   Specimen Description VALVE  Final   Special Requests MITRAL  Final   Gram Stain   Final    RARE WBC PRESENT,BOTH PMN AND MONONUCLEAR NO ORGANISMS SEEN    Culture   Final    No growth aerobically or anaerobically. Performed at [facility], [address], [place], [place] [address]    Report Status 03/21/2023 FINAL  Final  Acid Fast Culture with reflexed sensitivities     Status: None   Collection Time: 03/16/23  9:24 AM   Specimen: Mitral Valve Leaflets; Tissue  Result Value Ref Range Status   Acid Fast Culture Negative  Final    Comment: (NOTE) No acid fast bacilli isolated after 6 weeks. Performed At: [facility] [address] [place], [place] [id number] Jennette Shorter MD Ey:[id number]    Source of Sample VALVE  Final    Comment: Performed at [facility], [address], [place], [place] [address]  Acid Fast Smear (AFB)     Status: None   Collection Time: 03/16/23  9:24 AM   Specimen: Mitral Valve Leaflets; Tissue  Result Value Ref Range Status   AFB Specimen Processing Comment  Final    Comment: Tissue Grinding and Digestion/Decontamination   Acid Fast Smear Negative  Final    Comment: (NOTE) Performed At: [facility] [address] [place], [place] [id number] Jennette Shorter MD Ey:[id number]    Source (AFB) VALVE  Final    Comment: Performed at [facility], [address], [place], [place] [address]  Fungus Culture Result     Status: None   Collection Time: 03/16/23  9:24 AM  Result Value Ref Range Status   Result 1 Comment  Final    Comment: (NOTE) KOH/Calcofluor preparation:  no fungus observed. Performed At: [facility] [address] [place], [place] [id number] Jennette Shorter MD Ey:[id number]   Fungal organism reflex     Status: None   Collection Time: 03/16/23  9:24 AM  Result Value Ref Range Status   Fungal result 1 Comment  Final    Comment: (NOTE) No yeast or mold isolated after 4 weeks. Performed At: [facility] [address] [place], [place] [id number] Jennette Shorter MD Ey:[id number]   Fungus Culture With Stain     Status: None   Collection Time: 03/16/23 10:37 AM   Specimen: Path Tissue  Result Value Ref Range Status   Fungus Stain Final report  Final   Fungus (Mycology) Culture Final report  Final    Comment: (NOTE) Performed At: [facility] [address] [place], [place] [id number] Jennette Shorter MD Ey:[id number]    Fungal Source VALVE  Final    Comment: Performed at [facility], [address], [place], [place] [address]  Aerobic/Anaerobic Culture w Gram Stain (surgical/deep wound)     Status: None   Collection Time: 03/16/23 10:37 AM   Specimen: Path Tissue  Result Value Ref Range Status   Specimen Description VALVE  Final   Special Requests AORTIC  Final   Gram Stain   Final    RARE WBC PRESENT,BOTH PMN AND MONONUCLEAR NO ORGANISMS SEEN    Culture   Final    No growth aerobically or anaerobically. Performed at [facility], [address], [place], [place] [address]    Report Status 03/21/2023 FINAL  Final  Acid Fast Culture with reflexed sensitivities     Status: None   Collection  Time: 03/16/23 10:37 AM   Specimen: Path Tissue  Result Value Ref Range Status   Acid Fast Culture Negative  Final    Comment: (NOTE) No acid fast bacilli isolated after 6 weeks. Performed At: [facility] [address] [place], [place] [id number] Jennette Shorter MD Ey:[id number]    Source of Sample VALVE  Final    Comment: Performed at [facility], [address] [place], [place] [address]  Acid Fast Smear (AFB)     Status: None   Collection Time:  03/16/23 10:37 AM   Specimen: Path Tissue  Result Value Ref Range Status   AFB Specimen Processing Comment  Final    Comment: Tissue Grinding and Digestion/Decontamination   Acid Fast Smear Negative  Final    Comment: (NOTE) Performed At: [facility] [address] [place], [place] [id number] Jennette Shorter MD Ey:[id number]    Source (AFB) VALVE  Final    Comment: Performed at [facility], [address] [place], [place] [address]  Fungus Culture Result     Status: None   Collection Time: 03/16/23 10:37 AM  Result Value Ref Range Status   Result 1 Comment  Final    Comment: (NOTE) KOH/Calcofluor preparation:  no fungus observed. Performed At: [facility] [address] [place], [place] [id number] Jennette Shorter MD Ey:[id number]   Fungal organism reflex     Status: None   Collection Time: 03/16/23 10:37 AM  Result Value Ref Range Status   Fungal result 1 Comment  Final    Comment: (NOTE) No yeast or mold isolated after 4 weeks. Performed At: [facility] [address] [place], [place] [id number] Jennette Shorter MD Ey:[id number]   Culture, blood (Routine X 2) w Reflex to ID Panel     Status: None   Collection Time: 03/19/23 12:26 PM   Specimen: BLOOD  Result Value Ref Range Status   Specimen Description BLOOD RIGHT ANTECUBITAL  Final   Special Requests   Final    BOTTLES DRAWN AEROBIC AND ANAEROBIC Blood Culture results may not be optimal due to an inadequate volume of blood received in culture bottles   Culture   Final    NO GROWTH 5 DAYS Performed at [facility], [address] [place], [place] [address]    Report Status 03/24/2023 FINAL  Final  Culture, blood (Routine X 2) w Reflex to ID Panel     Status: None   Collection Time: 03/19/23 12:26 PM   Specimen: BLOOD  Result Value Ref Range Status   Specimen Description BLOOD RIGHT ANTECUBITAL  Final   Special Requests   Final    BOTTLES DRAWN AEROBIC AND ANAEROBIC Blood  Culture results may not be optimal due to an inadequate volume of blood received in culture bottles   Culture   Final    NO GROWTH 5 DAYS Performed at [facility], [address] [place], [place] [address]    Report Status 03/24/2023 FINAL  Final    Labs: CBC: Recent Labs  Lab 11/28/23 1053 11/29/23 0258 11/29/23 1126 11/30/23 0424  WBC 3.5* 2.8*  --  2.9*  NEUTROABS 1.5*  --   --   --   HGB 6.8* 7.3* 7.9* 7.6*  HCT 25.6* 26.5* 28.3* 27.7*  MCV 74.9* 75.5*  --  76.1*  PLT 171 159  --  162   Basic Metabolic Panel: Recent Labs  Lab 11/28/23 1053 11/28/23 1250 11/29/23 0258 11/30/23 0424  NA  138  --  137 136  K 3.9  --  4.0 4.2  CL 107  --  109 107  CO2 21*  --  21* 21*  GLUCOSE 89  --  91 86  BUN 7*  --  6* 6*  CREATININE 0.86  --  0.76 0.92  CALCIUM  8.6*  --  8.4* 8.6*  MG  --  2.0  --   --    Liver Function Tests: Recent Labs  Lab 11/28/23 1053 11/30/23 0424  AST 73* 54*  ALT 47* 37  ALKPHOS 107 93  BILITOT 1.1 1.1  PROT 7.7 7.0  ALBUMIN  3.5 3.3*   CBG: No results for input(s): GLUCAP in the last 168 hours.  Discharge time spent: greater than 30 minutes.  Signed: Adriana DELENA Grams, MD [facility] 11/30/2023

## 2023-11-30 NOTE — Telephone Encounter (Signed)
 Patient Product/process development scientist completed.    The patient is insured through [facility] MEDICAID.     Ran test claim for enoxaparin  (Lovenox ) 100 mg/ml and the current 7 day co-pay is $4.00.   This test claim was processed through [facility]- copay amounts may vary at other pharmacies due to pharmacy/plan contracts, or as the patient moves through the different stages of their insurance plan.     Reyes Sharps, CPHT Pharmacy Technician III Certified Patient Advocate [facility] Patient Advocate Team Direct Number: [phone]  Fax: [phone]

## 2023-11-30 NOTE — Hospital Course (Signed)
 63 y.o. male with medical history significant for diastolic CHF, alcohol abuse, pancytopenia, bacterial endocarditis, pericardial effusion, status post aortic and mitral valve repair.  He has been off of his warfarin for a month.  He went to his outpatient provider with complaints of generalized itching.  Blood work was done which revealed a low hemoglobin so he was asked by his PCP to come to the emergency department.  Hemoglobin noted to be 6.8.  Patient was hospitalized for further management.    Microcytic anemia Hemoglobin was as low as 6.8.  He was transfused 1 unit of PRBC.  Hemoglobin noted to be 7.6 this morning. Anemia panel shows ferritin of 24, iron 22, TIBC 5 7, percent saturation 4.  Folic acid  6.8.  Vitamin B12 309.  There is concern for iron deficiency.  Will benefit from iron supplementation. FOBT was positive. GI has been consulted... Anticipating colonoscopy:  -  He needs to be on warfarin for history of mitral and aortic valve replacement although he has been noncompliant recently. Per patient he has not been taking his warfarin for past couple weeks  Continue PPI   History of endocarditis status post aortic and mitral valve replacement Followed by cardiology.  Underwent the surgery earlier this year.  Was also followed by cardiothoracic surgery. He supposed be on warfarin but has not been on it for a month. Will continue to hold anticoagulation due to concern for occult GI bleed.  Once cleared by gastroenterology , post colonoscopy  he will need to be placed back on anticoagulation.   Ideally will need Lovenox  shot along with warfarin until INR is therapeutic.   Alcohol abuse/Hep C He was counseled about his significant alcohol consumption especially in view of the fact that he is on anticoagulation. CIWA protocol.  Thiamine  multivitamins folic acid . Elevated AST/ALt noted.  Improved,  Hep C positive results noted from 2023. GI following.   Borderline low B12  level Will benefit from B12 supplementation.   Essential hypertension Not on any antihypertensives.  Blood pressure remains stable. No medications needed   Chronic diastolic CHF Echocardiogram from March 2024 showed normal LVEF.  Currently well compensated.  Not on diuretics prior to admission.  Continue to monito

## 2023-11-30 NOTE — Anesthesia Postprocedure Evaluation (Signed)
 Anesthesia Post Note  Patient: Thomas [name] [facility]  Procedure(s) Performed: COLONOSCOPY EGD (ESOPHAGOGASTRODUODENOSCOPY)  Patient location during evaluation: Phase II Anesthesia Type: General Level of consciousness: awake and alert Pain management: pain level controlled Vital Signs Assessment: post-procedure vital signs reviewed and stable Respiratory status: spontaneous breathing, nonlabored ventilation and respiratory function stable Cardiovascular status: stable Anesthetic complications: no   There were no known notable events for this encounter.   Last Vitals:  Vitals:   11/30/23 1400 11/30/23 1410  BP: (!) 154/81   Pulse: 73 68  Resp: 16 16  Temp:  36.6 C  SpO2: 100% 100%    Last Pain:  Vitals:   11/30/23 1410  TempSrc:   PainSc: 0-No pain                 Jordon Bourquin L Mallorey Odonell

## 2023-11-30 NOTE — Telephone Encounter (Signed)
 Seen inpatient by GI service. Underwent EGD and colonoscopy today with Dr. Cinderella. He has requested for patient to have givens capsule endoscopy. Dx: anemia  He is on coumadin  and we recommended oral iron therapy daily. Will need to be off this for at least 5 days prior to capsule.

## 2023-11-30 NOTE — Interval H&P Note (Signed)
 History and Physical Interval Note:  11/30/2023 12:50 PM  Thomas [name]  has presented today for surgery, with the diagnosis of iron deficiency anemia, hemoccult positive stool, refractory acid reflux.  The various methods of treatment have been discussed with the patient and family. After consideration of risks, benefits and other options for treatment, the patient has consented to  Procedure(s): COLONOSCOPY (N/A) EGD (ESOPHAGOGASTRODUODENOSCOPY) (N/A) as a surgical intervention.  The patient's history has been reviewed, patient examined, no change in status, stable for surgery.  I have reviewed the patient's chart and labs.  Questions were answered to the patient's satisfaction.    I thoroughly discussed with the patient the procedure, including the risks involved. Patient understands what the procedure involves including the benefits and any risks. Patient understands alternatives to the proposed procedure. Risks including (but not limited to) bleeding, tearing of the lining (perforation), rupture of adjacent organs, problems with heart and lung function, infection, and medication reactions. A small percentage of complications may require surgery, hospitalization, repeat endoscopic procedure, and/or transfusion.  Patient understood and agreed.    Thomas [name]

## 2023-11-30 NOTE — Progress Notes (Addendum)
 Contacted by nursing this morning stating patient wanted to leave AMA given he had yet had his colonoscopy this morning.  He again reiterates his concern about leaving this afternoon given he has to start a new job tomorrow.  I discussed with him that I had a conversation with Dr. Cinderella who stated that he would be his first inpatient to be performed today but it could be early afternoon.  As long as procedure goes smoothly and he awakens without difficulty and there are no complications that he could likely go home today.  After discussion with him today, he has agreed to stay and perform colonoscopy and upper endoscopy for evaluation of his anemia as long as he can be discharged this afternoon.  Hospitalist also at bedside and aware of plan.  He has appropriate discharge so that way as soon as patient is stable postop, discharge can occur.  Reviewed ultrasound performed yesterday which revealed a benign 9 cm cyst in the left liver with mildly increased parenchymal echogenicity likely hepatic steatosis or other underlying hepatocellular disease.  HCV antibody was positive therefore HCVRNA was ordered and remains pending.  AFP tumor marker also pending.  LFTs remain slightly elevated with AST 54 today which is improved from 73 two days ago.  ALT normal at 37, alk phos normal at 93, bilirubin normal at 1.1.  Outpatient will need to follow-up on HCVRNA and AFP tumor marker and consider treatment if positive RNA.  He will need follow-up MRI of the liver outpatient to further evaluate left liver cyst.  Ultrasound in 2022 noting indeterminate 19 mm hypoechoic mass in the right hepatic lobe which was not identified on ultrasound in 2023 or ultrasound performed yesterday.  Charmaine Melia, MSN, APRN, FNP-BC, AGACNP-BC [facility] Gastroenterology at [facility]

## 2023-11-30 NOTE — Anesthesia Preprocedure Evaluation (Addendum)
 Anesthesia Evaluation    Reviewed: Allergy & Precautions, H&P , Patient's Chart, lab work & pertinent test results, Unable to perform ROS - Chart review only  Airway Mallampati: II  TM Distance: >3 FB Neck ROM: Full    Dental no notable dental hx. (+) Dental Advisory Given, Teeth Intact   Pulmonary neg pulmonary ROS   Pulmonary exam normal breath sounds clear to auscultation       Cardiovascular hypertension, Pt. on medications Normal cardiovascular exam+ Valvular Problems/Murmurs  Rhythm:Regular Rate:Normal  Intra-op TEE 03/16/2023 _ Left Ventricle: The left ventriclar function remains normal on inotropic support.  _ Right Ventricle: The right ventriclar function is normal on inotropic support.  _ Aorta: The aorta appears unchanged from pre-bypass. There is no dissection  _ Aortic Valve: There is a mechanical valve in the aortic position the valve is well seated with no paravalvular leak. Trace intravalvular regurgiation jets visualized consistent with normal washing jets. Mean PG . Vmax 2.8 m/s  _ Mitral Valve: There is a mechanical valve in the mitral position. The valve is well seated with no paravalvular leak. Trace regurgitation jets visualized consistent with normal washing jets.  _ Tricuspid Valve: The tricuspid valve appears unchanged from pre-bypass. There is mild TR.  _ Pulmonic Valve: The pulmonic valve appears unchanged from pre-bypass.  _ Interatrial Septum: The interatrial septum appears intact with no shunt following surgical closure.  _ Pericardium: There is no pericardial effusion.    Echo 03/12/2023  1. Aortic valve vegetation noted 0.6 cm x 0.8 cm located in the commissure between the NCC/LCC. There is severe aortic regurgitation that is eccentric and likely leaflet perforation. There is holodiastolic flow reversal in the descending aorta consistent with severe AI. 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 Moderate stenosis, likely flow related in setting of severe AI. The aortic valve is tricuspid. There is moderate calcification of the aortic valve. There is moderate thickening of the aortic valve. Aortic valve regurgitation is severe. Moderate aortic valve stenosis. Aortic regurgitation PHT measures 393 msec. Aortic valve area, by VTI measures 2.69 cm. Aortic valve mean gradient measures 22.0 mmHg. Aortic valve Vmax measures 2.99 m/s.   2. Severe eccentric mitral regurgitation that is posteriorly directed. Vegetation noted on the A3 segment that measures 0.4 cm x 0.7 cm. There is also a flail A1/A2 segment with likely leaflet perforation. Findings consistent with endocarditis and significant valvular destruction. Systolic blunting in all 4 pulmonary veins. The mitral valve is abnormal. Severe mitral valve regurgitation. No evidence of mitral stenosis.   3. Left ventricular ejection fraction, by estimation, is 60 to 65%. The left ventricle has normal function.   4. Right ventricular systolic function is normal. The right ventricular size is normal.   5. Left atrial size was mildly dilated. No left atrial/left atrial appendage thrombus was detected. The LAA emptying velocity was 54 cm/s.   6. There is mild (Grade II) layered plaque involving the descending aorta.   7. Agitated saline contrast bubble study was positive with shunting observed after >6 cardiac cycles suggestive of intrapulmonary shunting.   Conclusion(s)/Recommendation(s): Findings are concerning for vegetation/infective endocarditis as detailed above.     Neuro/Psych negative neurological ROS  negative psych ROS   GI/Hepatic ,GERD  ,,(+)     substance abuse  alcohol use  Endo/Other  negative endocrine ROS    Renal/GU negative Renal ROS  negative genitourinary   Musculoskeletal negative musculoskeletal ROS (+)    Abdominal   Peds negative  pediatric ROS (+)  Hematology  (+) Blood dyscrasia, anemia  Thromocytopenia. Hgb 7.6   Anesthesia Other Findings Echo excellent now after AVR and MVR  Reproductive/Obstetrics negative OB ROS                              Anesthesia Physical Anesthesia Plan  ASA: 3  Anesthesia Plan: General   Post-op Pain Management: Minimal or no pain anticipated   Induction: Intravenous  PONV Risk Score and Plan: Propofol  infusion  Airway Management Planned: Nasal Cannula and Natural Airway  Additional Equipment: None  Intra-op Plan:   Post-operative Plan:   Informed Consent: I have reviewed the patients History and Physical, chart, labs and discussed the procedure including the risks, benefits and alternatives for the proposed anesthesia with the patient or authorized representative who has indicated his/her understanding and acceptance.     Dental advisory given  Plan Discussed with: CRNA  Anesthesia Plan Comments:          Anesthesia Quick Evaluation

## 2023-11-30 NOTE — Progress Notes (Signed)
 Patient back to the floor escorted by RN.  Met by RN in the room.

## 2023-11-30 NOTE — Transfer of Care (Signed)
 Immediate Anesthesia Transfer of Care Note  Patient: Thomas [name] [facility]  Procedure(s) Performed: COLONOSCOPY EGD (ESOPHAGOGASTRODUODENOSCOPY)  Patient Location: PACU  Anesthesia Type:MAC  Level of Consciousness: sedated and patient cooperative  Airway & Oxygen Therapy: Patient Spontanous Breathing  Post-op Assessment: Report given to RN and Post -op Vital signs reviewed and stable  Post vital signs: Reviewed and stable  Last Vitals:  Vitals Value Taken Time  BP 103/59 11/30/23 13:35  Temp 36.3 C 11/30/23 13:35  Pulse 75 11/30/23 13:35  Resp 19 11/30/23 13:35  SpO2 99 % 11/30/23 13:35    Last Pain:  Vitals:   11/30/23 1257  TempSrc:   PainSc: 0-No pain      Patients Stated Pain Goal: 5 (11/30/23 1217)  Complications: No notable events documented.

## 2023-12-03 ENCOUNTER — Encounter

## 2023-12-03 LAB — SURGICAL PATHOLOGY

## 2023-12-03 NOTE — Telephone Encounter (Signed)
LMOVM to call back for pt to schedule °

## 2023-12-04 ENCOUNTER — Ambulatory Visit (INDEPENDENT_AMBULATORY_CARE_PROVIDER_SITE_OTHER): Payer: Self-pay | Admitting: Gastroenterology

## 2023-12-06 ENCOUNTER — Encounter (HOSPITAL_COMMUNITY): Payer: Self-pay | Admitting: Gastroenterology

## 2023-12-11 NOTE — Telephone Encounter (Signed)
 LMOVM to call back to schedule capsule with Dr. Cinderella

## 2023-12-14 NOTE — Progress Notes (Signed)
 3 yr TCS noted in recall Patient result letter mailed

## 2023-12-18 ENCOUNTER — Ambulatory Visit: Attending: Physician Assistant | Admitting: Physician Assistant

## 2023-12-19 ENCOUNTER — Ambulatory Visit (INDEPENDENT_AMBULATORY_CARE_PROVIDER_SITE_OTHER): Admitting: Gastroenterology

## 2023-12-19 ENCOUNTER — Encounter: Payer: Self-pay | Admitting: Physician Assistant

## 2023-12-25 ENCOUNTER — Telehealth (INDEPENDENT_AMBULATORY_CARE_PROVIDER_SITE_OTHER): Payer: Self-pay

## 2023-12-25 NOTE — Telephone Encounter (Signed)
 Spoke with patient, scheduled givens capsule endoscopy for 01/31/2024 at 7:30am. Instructions sent to mychart.

## 2023-12-25 NOTE — Telephone Encounter (Signed)
 PA on [facility] for [facility]  Notification or Prior Authorization is not required for the requested services This [employer] Medicaid members plan does not currently require a prior authorization for these services. Decision ID #: [id number]

## 2023-12-27 ENCOUNTER — Telehealth (INDEPENDENT_AMBULATORY_CARE_PROVIDER_SITE_OTHER): Payer: Self-pay

## 2023-12-27 NOTE — Telephone Encounter (Signed)
 Patient called stating that he thought he had an office appointment coming up and not the capsule study. Patient states he does not want the capsule study and would like to know what the next steps would be. Please advise Dr. Cinderella.  Message sent to endo to cancel capsule endoscopy.

## 2023-12-27 NOTE — Telephone Encounter (Signed)
 We will see him in clinic and discuss  Hi Devere,  Can you please schedule a follow up appointment for this patient in 2-3 weeks  with me  or any of the APPs?  Thanks,  Darletta Noblett Faizan Tani Virgo , MD Gastroenterology and Hepatology [facility] Gastroenterology

## 2023-12-31 ENCOUNTER — Encounter: Payer: Self-pay | Admitting: Radiology

## 2024-01-07 ENCOUNTER — Observation Stay (HOSPITAL_COMMUNITY): Admission: EM | Admit: 2024-01-07 | Discharge: 2024-01-09 | Attending: Internal Medicine | Admitting: Internal Medicine

## 2024-01-07 ENCOUNTER — Emergency Department (HOSPITAL_COMMUNITY)

## 2024-01-07 ENCOUNTER — Other Ambulatory Visit: Payer: Self-pay

## 2024-01-07 ENCOUNTER — Encounter (HOSPITAL_COMMUNITY): Payer: Self-pay

## 2024-01-07 DIAGNOSIS — K7460 Unspecified cirrhosis of liver: Secondary | ICD-10-CM | POA: Diagnosis not present

## 2024-01-07 DIAGNOSIS — R791 Abnormal coagulation profile: Secondary | ICD-10-CM

## 2024-01-07 DIAGNOSIS — F1010 Alcohol abuse, uncomplicated: Secondary | ICD-10-CM | POA: Insufficient documentation

## 2024-01-07 DIAGNOSIS — I110 Hypertensive heart disease with heart failure: Secondary | ICD-10-CM | POA: Insufficient documentation

## 2024-01-07 DIAGNOSIS — D509 Iron deficiency anemia, unspecified: Secondary | ICD-10-CM | POA: Insufficient documentation

## 2024-01-07 DIAGNOSIS — R7401 Elevation of levels of liver transaminase levels: Secondary | ICD-10-CM | POA: Insufficient documentation

## 2024-01-07 DIAGNOSIS — Q6211 Congenital occlusion of ureteropelvic junction: Principal | ICD-10-CM

## 2024-01-07 DIAGNOSIS — F411 Generalized anxiety disorder: Secondary | ICD-10-CM | POA: Insufficient documentation

## 2024-01-07 DIAGNOSIS — I5032 Chronic diastolic (congestive) heart failure: Secondary | ICD-10-CM | POA: Insufficient documentation

## 2024-01-07 DIAGNOSIS — R10A2 Flank pain, left side: Secondary | ICD-10-CM | POA: Diagnosis not present

## 2024-01-07 LAB — COMPREHENSIVE METABOLIC PANEL WITH GFR
ALT: 96 U/L — ABNORMAL HIGH (ref 0–44)
AST: 127 U/L — ABNORMAL HIGH (ref 15–41)
Albumin: 3.4 g/dL — ABNORMAL LOW (ref 3.5–5.0)
Alkaline Phosphatase: 107 U/L (ref 38–126)
Anion gap: 11 (ref 5–15)
BUN: 7 mg/dL — ABNORMAL LOW (ref 8–23)
CO2: 20 mmol/L — ABNORMAL LOW (ref 22–32)
Calcium: 8.4 mg/dL — ABNORMAL LOW (ref 8.9–10.3)
Chloride: 106 mmol/L (ref 98–111)
Creatinine, Ser: 0.94 mg/dL (ref 0.61–1.24)
GFR, Estimated: >60 mL/min (ref >60)
Glucose, Bld: 94 mg/dL (ref 70–99)
Potassium: 4 mmol/L (ref 3.5–5.1)
Sodium: 137 mmol/L (ref 135–145)
Total Bilirubin: 1.2 mg/dL (ref 0.0–1.2)
Total Protein: 7.5 g/dL (ref 6.5–8.1)

## 2024-01-07 LAB — CBC WITH DIFFERENTIAL/PLATELET
Abs Immature Granulocytes: 0 K/uL (ref 0.00–0.07)
Basophils Absolute: 0 K/uL (ref 0.0–0.1)
Basophils Relative: 1 %
Eosinophils Absolute: 0.2 K/uL (ref 0.0–0.5)
Eosinophils Relative: 5 %
HCT: 28.3 % — ABNORMAL LOW (ref 39.0–52.0)
Hemoglobin: 8 g/dL — ABNORMAL LOW (ref 13.0–17.0)
Immature Granulocytes: 0 %
Lymphocytes Relative: 16 %
Lymphs Abs: 0.6 K/uL — ABNORMAL LOW (ref 0.7–4.0)
MCH: 21.4 pg — ABNORMAL LOW (ref 26.0–34.0)
MCHC: 28.3 g/dL — ABNORMAL LOW (ref 30.0–36.0)
MCV: 75.9 fL — ABNORMAL LOW (ref 80.0–100.0)
Monocytes Absolute: 0.7 K/uL (ref 0.1–1.0)
Monocytes Relative: 20 %
Neutro Abs: 2 K/uL (ref 1.7–7.7)
Neutrophils Relative %: 58 %
Platelets: 196 K/uL (ref 150–400)
RBC: 3.73 MIL/uL — ABNORMAL LOW (ref 4.22–5.81)
RDW: 21.1 % — ABNORMAL HIGH (ref 11.5–15.5)
Smear Review: NORMAL
WBC: 3.5 K/uL — ABNORMAL LOW (ref 4.0–10.5)
nRBC: 0 % (ref 0.0–0.2)

## 2024-01-07 LAB — URINALYSIS, ROUTINE W REFLEX MICROSCOPIC
Bacteria, UA: NONE SEEN
Bilirubin Urine: NEGATIVE
Glucose, UA: NEGATIVE mg/dL
Ketones, ur: NEGATIVE mg/dL
Leukocytes,Ua: NEGATIVE
Nitrite: NEGATIVE
Protein, ur: NEGATIVE mg/dL
Specific Gravity, Urine: 1.018 (ref 1.005–1.030)
pH: 7 (ref 5.0–8.0)

## 2024-01-07 LAB — APTT: aPTT: 34 s (ref 24–36)

## 2024-01-07 LAB — PROTIME-INR
INR: 1.3 — ABNORMAL HIGH (ref 0.8–1.2)
Prothrombin Time: 17.1 s — ABNORMAL HIGH (ref 11.4–15.2)

## 2024-01-07 MED ORDER — HEPARIN BOLUS VIA INFUSION
5000.0000 [IU] | Freq: Once | INTRAVENOUS | Status: AC
  Administered 2024-01-07: 5000 [IU] via INTRAVENOUS

## 2024-01-07 MED ORDER — FERROUS SULFATE 325 (65 FE) MG PO TABS
325.0000 mg | ORAL_TABLET | Freq: Two times a day (BID) | ORAL | Status: DC
  Administered 2024-01-08 – 2024-01-09 (×3): 325 mg via ORAL
  Filled 2024-01-07 (×3): qty 1

## 2024-01-07 MED ORDER — HYDROMORPHONE HCL 1 MG/ML IJ SOLN
0.5000 mg | Freq: Once | INTRAMUSCULAR | Status: AC
  Administered 2024-01-07: 0.5 mg via INTRAVENOUS
  Filled 2024-01-07: qty 0.5

## 2024-01-07 MED ORDER — ADULT MULTIVITAMIN W/MINERALS CH
1.0000 | ORAL_TABLET | Freq: Every day | ORAL | Status: DC
  Administered 2024-01-07 – 2024-01-09 (×3): 1 via ORAL
  Filled 2024-01-07 (×3): qty 1

## 2024-01-07 MED ORDER — OXYCODONE HCL 5 MG PO TABS: 5.0000 mg | ORAL_TABLET | ORAL | Status: DC | PRN

## 2024-01-07 MED ORDER — PANTOPRAZOLE SODIUM 40 MG PO TBEC
40.0000 mg | DELAYED_RELEASE_TABLET | Freq: Two times a day (BID) | ORAL | Status: DC
  Administered 2024-01-07 – 2024-01-09 (×4): 40 mg via ORAL
  Filled 2024-01-07 (×4): qty 1

## 2024-01-07 MED ORDER — HEPARIN (PORCINE) 25000 UT/250ML-% IV SOLN
1850.0000 [IU]/h | INTRAVENOUS | Status: DC
  Administered 2024-01-07: 1400 [IU]/h via INTRAVENOUS
  Administered 2024-01-08: 1700 [IU]/h via INTRAVENOUS
  Administered 2024-01-08: 1850 [IU]/h via INTRAVENOUS
  Filled 2024-01-07 (×3): qty 250

## 2024-01-07 MED ORDER — IOHEXOL 300 MG/ML  SOLN
100.0000 mL | Freq: Once | INTRAMUSCULAR | Status: AC | PRN
  Administered 2024-01-07: 100 mL via INTRAVENOUS

## 2024-01-07 MED ORDER — ALPRAZOLAM 1 MG PO TABS
1.0000 mg | ORAL_TABLET | Freq: Every evening | ORAL | Status: DC | PRN
  Administered 2024-01-07 – 2024-01-08 (×2): 1 mg via ORAL
  Filled 2024-01-07 (×2): qty 1

## 2024-01-07 MED ORDER — VITAMIN B-12 1000 MCG PO TABS
1000.0000 ug | ORAL_TABLET | Freq: Every day | ORAL | Status: DC
  Administered 2024-01-07 – 2024-01-09 (×3): 1000 ug via ORAL
  Filled 2024-01-07 (×3): qty 1

## 2024-01-07 MED ORDER — POLYETHYLENE GLYCOL 3350 17 G PO PACK: 17.0000 g | PACK | Freq: Every day | ORAL | Status: DC | PRN

## 2024-01-07 MED ORDER — DIPHENHYDRAMINE HCL 25 MG PO CAPS
25.0000 mg | ORAL_CAPSULE | Freq: Once | ORAL | Status: AC
  Administered 2024-01-07: 25 mg via ORAL
  Filled 2024-01-07: qty 1

## 2024-01-07 MED ORDER — WARFARIN - PHARMACIST DOSING INPATIENT: Freq: Every day | Status: DC

## 2024-01-07 MED ORDER — ONDANSETRON HCL 4 MG/2ML IJ SOLN
4.0000 mg | Freq: Once | INTRAMUSCULAR | Status: AC
  Administered 2024-01-07: 4 mg via INTRAVENOUS
  Filled 2024-01-07: qty 2

## 2024-01-07 MED ORDER — LACTATED RINGERS IV SOLN: INTRAVENOUS | Status: AC

## 2024-01-07 MED ORDER — PROCHLORPERAZINE EDISYLATE 10 MG/2ML IJ SOLN
5.0000 mg | Freq: Four times a day (QID) | INTRAMUSCULAR | Status: DC | PRN
  Administered 2024-01-07: 5 mg via INTRAVENOUS
  Filled 2024-01-07: qty 2

## 2024-01-07 MED ORDER — HYDROMORPHONE HCL 1 MG/ML IJ SOLN
0.5000 mg | INTRAMUSCULAR | Status: DC | PRN
  Administered 2024-01-07: 0.5 mg via INTRAVENOUS
  Filled 2024-01-07: qty 0.5

## 2024-01-07 MED ORDER — WARFARIN SODIUM 4 MG PO TABS
8.0000 mg | ORAL_TABLET | Freq: Once | ORAL | Status: AC
  Administered 2024-01-07: 8 mg via ORAL
  Filled 2024-01-07: qty 2
  Filled 2024-01-07: qty 4

## 2024-01-07 NOTE — ED Triage Notes (Signed)
 Pt c/o left side flank pain starting this morning. Pt reports hx of kidney stones and this symptom feels the same.

## 2024-01-07 NOTE — ED Notes (Signed)
 Patient transported to CT

## 2024-01-07 NOTE — ED Provider Notes (Signed)
 [name] Provider Note   CSN: [id number] Arrival date & time: 01/07/24  1314     Patient presents with: Flank Pain   Thomas [name] is a 63 y.o. male.  {Add pertinent medical, surgical, social history, OB history to YEP:67052} Patient has a history of aortic valve replacement and mitral valve replacement.  He also has history of anemia with gastritis and duodenitis.  Patient was just admitted to the hospital for this.  His anemia has been chronic.  Patient had a colonoscopy and upper endoscopy patient presents with left flank pain which is acute.  He states he has a history of kidney stones and feels like this is a kidney stone.  Patient also has cirrhosis  The history is provided by the patient and medical records.  Flank Pain This is a new problem. The current episode started 12 to 24 hours ago. The problem occurs constantly. The problem has not changed since onset.Pertinent negatives include no chest pain, no abdominal pain and no headaches. Nothing aggravates the symptoms. Nothing relieves the symptoms.       Prior to Admission medications   Medication Sig Start Date End Date Taking? Authorizing Provider  acetaminophen  (TYLENOL ) 325 MG tablet Take 2 tablets (650 mg total) by mouth every 4 (four) hours as needed for mild pain (pain score 1-3) or fever. 03/26/23   Gold, Wayne E, PA-C  ALPRAZolam  (XANAX ) 1 MG tablet Take 2 mg by mouth at bedtime.    [provider]  cyanocobalamin (VITAMIN B12) 1000 MCG tablet Take 1 tablet (1,000 mcg total) by mouth daily. 11/30/23 01/29/24  Willette Adriana LABOR, MD  enoxaparin  (LOVENOX ) 100 MG/ML injection Inject 0.9 mLs (90 mg total) into the skin every 12 (twelve) hours. 11/30/23   Shahmehdi, Seyed A, MD  ferrous sulfate 325 (65 FE) MG tablet Take 1 tablet (325 mg total) by mouth 2 (two) times daily with a meal. 11/30/23 01/29/24  Shahmehdi, Adriana LABOR, MD  magnesium  oxide (MAG-OX) 400 (240 Mg) MG  tablet Take 1 tablet (400 mg total) by mouth 2 (two) times daily. 03/26/23   Gold, Wayne E, PA-C  Multiple Vitamin (MULTIVITAMIN WITH MINERALS) TABS tablet Take 1 tablet by mouth daily. 12/01/23   Shahmehdi, Adriana LABOR, MD  pantoprazole  (PROTONIX ) 40 MG tablet Take 1 tablet (40 mg total) by mouth 2 (two) times daily. 11/30/23 12/30/23  Willette Adriana LABOR, MD  potassium chloride  SA (KLOR-CON  M) 20 MEQ tablet TAKE 1 TABLET BY MOUTH TWICE A DAY 06/04/23   Cooper, Michael, MD  warfarin (COUMADIN ) 4 MG tablet Take 2 tablets (8 mg total) by mouth daily at 4 PM. 11/30/23 12/30/23  Willette Adriana LABOR, MD    Allergies: Hydrocodone     Review of Systems  Constitutional:  Negative for appetite change and fatigue.  HENT:  Negative for congestion, ear discharge and sinus pressure.   Eyes:  Negative for discharge.  Respiratory:  Negative for cough.   Cardiovascular:  Negative for chest pain.  Gastrointestinal:  Negative for abdominal pain and diarrhea.  Genitourinary:  Positive for flank pain. Negative for frequency and hematuria.  Musculoskeletal:  Negative for back pain.  Skin:  Negative for rash.  Neurological:  Negative for seizures and headaches.  Psychiatric/Behavioral:  Negative for hallucinations.     Updated Vital Signs BP 136/75 (BP Location: Right Arm)   Pulse 95   Temp 98.5 F (36.9 C) (Oral)   Resp 20   Ht 5' 11 (1.803  m)   Wt 86.2 kg   SpO2 98%   BMI 26.50 kg/m   Physical Exam Vitals and nursing note reviewed.  Constitutional:      Appearance: He is well-developed.  HENT:     Head: Normocephalic.     Nose: Nose normal.  Eyes:     General: No scleral icterus.    Conjunctiva/sclera: Conjunctivae normal.  Neck:     Thyroid: No thyromegaly.  Cardiovascular:     Rate and Rhythm: Normal rate and regular rhythm.     Heart sounds: No murmur heard.    No friction rub. No gallop.  Pulmonary:     Breath sounds: No stridor. No wheezing or rales.  Chest:     Chest wall: No tenderness.   Abdominal:     General: There is no distension.     Tenderness: There is no abdominal tenderness. There is no rebound.  Musculoskeletal:        General: Normal range of motion.     Cervical back: Neck supple.  Lymphadenopathy:     Cervical: No cervical adenopathy.  Skin:    Findings: No erythema or rash.  Neurological:     Mental Status: He is alert and oriented to person, place, and time.     Motor: No abnormal muscle tone.     Coordination: Coordination normal.  Psychiatric:        Behavior: Behavior normal.     (all labs ordered are listed, but only abnormal results are displayed) Labs Reviewed  CBC WITH DIFFERENTIAL/PLATELET - Abnormal; Notable for the following components:      Result Value   WBC 3.5 (*)    RBC 3.73 (*)    Hemoglobin 8.0 (*)    HCT 28.3 (*)    MCV 75.9 (*)    MCH 21.4 (*)    MCHC 28.3 (*)    RDW 21.1 (*)    All other components within normal limits  URINALYSIS, ROUTINE W REFLEX MICROSCOPIC  COMPREHENSIVE METABOLIC PANEL WITH GFR  PROTIME-INR    EKG: None  Radiology: CT Renal Stone Study Result Date: 01/07/2024 CLINICAL DATA:  The abdominal/flank pain.  Concern for kidney stone. EXAM: CT ABDOMEN AND PELVIS WITHOUT CONTRAST TECHNIQUE: Multidetector CT imaging of the abdomen and pelvis was performed following the standard protocol without IV contrast. RADIATION DOSE REDUCTION: This exam was performed according to the departmental dose-optimization program which includes automated exposure control, adjustment of the mA and/or kV according to patient size and/or use of iterative reconstruction technique. COMPARISON:  None Available. FINDINGS: Evaluation of this exam is limited in the absence of intravenous contrast. Lower chest: Trace left pleural effusion suspected. No intra-abdominal free air or free fluid. Hepatobiliary: Cirrhosis. No biliary dilatation. The gallbladder is unremarkable Pancreas: Unremarkable. No pancreatic ductal dilatation or  surrounding inflammatory changes. Spleen: Normal in size without focal abnormality. Adrenals/Urinary Tract: The adrenal glands are unremarkable. There is a severe left hydronephrosis versus left renal parapelvic cysts. CT with IV contrast may provide better evaluation. The right kidney is unremarkable. No stone noted on either side. The visualized ureters and urinary bladder appear unremarkable. Stomach/Bowel: Moderate stool throughout the colon. There is a small hiatal hernia. There is no bowel obstruction or active inflammation. The appendix is normal. Vascular/Lymphatic: Mild aortoiliac atherosclerotic disease. The IVC is unremarkable. No portal venous gas. There is no adenopathy. Reproductive: The prostate and seminal vesicles are grossly unremarkable. No pelvic mass. Other: None Musculoskeletal: Disc desiccation and vacuum phenomena at L5-S1. No  acute osseous pathology. IMPRESSION: 1. Severe left hydronephrosis versus left renal parapelvic cysts. CT with IV contrast may provide better evaluation. No renal calculi. 2. Cirrhosis. 3. No bowel obstruction. Normal appendix. 4.  Aortic Atherosclerosis (ICD10-I70.0). Electronically Signed   By: Vanetta Chou M.D.   On: 01/07/2024 15:04    {Document cardiac monitor, telemetry assessment procedure when appropriate:32947} Procedures   Medications Ordered in the ED  HYDROmorphone  (DILAUDID ) injection 0.5 mg (0.5 mg Intravenous Given 01/07/24 1404)  ondansetron  (ZOFRAN ) injection 4 mg (4 mg Intravenous Given 01/07/24 1406)   Patient with possible severe hydronephrosis on the left side.  He is to get a CT with contrast to evaluate further.   {Click here for ABCD2, HEART and other calculators REFRESH Note before signing:1}                              Medical Decision Making Amount and/or Complexity of Data Reviewed Labs: ordered. Radiology: ordered.  Risk Prescription drug management.   Flank pain with possible hydronephrosis and anemia while on  Coumadin   {Document critical care time when appropriate  Document review of labs and clinical decision tools ie CHADS2VASC2, etc  Document your independent review of radiology images and any outside records  Document your discussion with family members, caretakers and with consultants  Document social determinants of health affecting pt's care  Document your decision making why or why not admission, treatments were needed:32947:::1}   Final diagnoses:  None    ED Discharge Orders     None

## 2024-01-07 NOTE — Plan of Care (Signed)

## 2024-01-07 NOTE — ED Provider Notes (Signed)
  Physical Exam  BP 136/75 (BP Location: Right Arm)   Pulse 95   Temp 98.5 F (36.9 C) (Oral)   Resp 20   Ht 5' 11 (1.803 m)   Wt 86.2 kg   SpO2 98%   BMI 26.50 kg/m   Physical Exam  Procedures  .Critical Care  Performed by: Charlyn Sora, MD Authorized by: Charlyn Sora, MD   Critical care provider statement:    Critical care time (minutes):  48   Critical care time was exclusive of:  Separately billable procedures and treating other patients   Critical care was necessary to treat or prevent imminent or life-threatening deterioration of the following conditions:  Circulatory failure   Critical care was time spent personally by me on the following activities:  Development of treatment plan with patient or surrogate, discussions with consultants, evaluation of patient's response to treatment, examination of patient, ordering and review of laboratory studies, ordering and review of radiographic studies, ordering and performing treatments and interventions, pulse oximetry, re-evaluation of patient's condition, review of old charts and obtaining history from patient or surrogate   ED Course / MDM    Medical Decision Making Amount and/or Complexity of Data Reviewed Labs: ordered. Radiology: ordered.  Risk Prescription drug management. Decision regarding hospitalization.   Assuming care of patient from Dr. Suzette   Patient in the ED for flank pain. Pt has hx of renal stones and liver cirrhosis. He has hx of alcohol use disorder.  Workup thus far shows severe hydronephrosis most probably on CT non contrast, but no stone.  Concerning findings are as following - Ct showing left-sided hydronephrosis, but no kidney stone. Important pending results are Ct with contrast, labs.  According to Dr. Suzette, plan is to dispo per CT and lab findings.  Patient had no complains, no concerns from the nursing side. Will continue to monitor.   7:17 PM Patient's CT with contrast  reveals left-sided hydronephrosis, with proximal ureter obstruction.  I consulted urology and spoke with Dr. Lovie.  We discussed patient's presenting symptoms, CT findings and his previous medical history including metallic valve for which she is on Coumadin , with subtherapeutic INR. Dr. Lovie indicated that patient likely has chronic obstruction and he will not need any acute intervention, patient can be followed up by urologist in [place] in a week.  Patient's INR is subtherapeutic.  He states that he had stopped taking his Coumadin  at 1 point, but has restarted it.  He does not go to Coumadin  clinic.  He has been managing Coumadin  at home by himself.    Given that he has metallic valve, he is at high risk for complications such as strokes if his INR is not therapeutic.  I consulted medicine, they will admit the patient and consult pharmacy for bridging.  Dr. Shona recommends starting IV heparin .         Charlyn Sora, MD 01/07/24 1919

## 2024-01-07 NOTE — Consult Note (Addendum)
 PHARMACY - ANTICOAGULATION CONSULT NOTE  Pharmacy Consult for Heparin  and Warfarin Indication: MVR  Allergies  Allergen Reactions   Hydrocodone  Rash    Patient Measurements: Height: 5' 11 (180.3 cm) Weight: 86.2 kg (190 lb) IBW/kg (Calculated) : 75.3 HEPARIN  DW (KG): 86.2  Vital Signs: Temp: 98.5 F (36.9 C) (11/10 1337) Temp Source: Oral (11/10 1337) BP: 155/89 (11/10 1725) Pulse Rate: 88 (11/10 1725)  Labs: Recent Labs    01/07/24 1405  HGB 8.0*  HCT 28.3*  PLT 196  LABPROT 17.1*  INR 1.3*  CREATININE 0.94    Estimated Creatinine Clearance: 85.7 mL/min (by C-G formula based on SCr of 0.94 mg/dL).   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 Per patient, he states that he had stopped taking his Coumadin  at one point, but has restarted it. He does not go to Coumadin  clinic. He has been managing Coumadin  at home by himself. Pharmacy has been consulted to initiate and dose continuous heparin  infusion and warfarin.  Baseline labs: INR 1.3, Hgb 8.0, Plts 196, aPTT pending  Goal of Therapy:  INR 2-3 Heparin  level 0.3-0.7 units/ml Monitor platelets by anticoagulation protocol: Yes   Plan:  Warfarin - Give warfarin 8mg  x 1 this evening - Recheck INR daily with morning labs - CBC/H&H daily  Heparin  - Give 5000 units bolus x 1 - Start heparin  infusion at 1400 units/hr -  Check anti-Xa level in 6 hours and daily while on heparin  - Continue heparin  until INR is therapeutic - Continue to monitor H&H and platelets  Jaycee Mckellips A Zareena Willis 01/07/2024,7:34 PM

## 2024-01-07 NOTE — H&P (Incomplete)
 History and Physical  Thomas [name] [facility] FMW:[id number] DOB: 07-Nov-1960 DOA: 01/07/2024  Referring physician: Dr. Charlyn, EDP  PCP: Marvine Rush, MD  Outpatient Specialists: Cardiology, cardiothoracic surgery. Patient coming from: Home.  Chief Complaint: Left flank pain.  HPI: Thomas [name] is a 63 y.o. male with medical history significant for hypertension, GERD, generalized anxiety disorder, iron deficiency anemia, chronic gastritis on twice daily PPI, alcohol abuse, left nephrectomy, chronic HFpEF, aortic and mitral valve endocarditis, severe aortic insufficiency and mitral regurgitation with vegetations status post mechanical aortic valve replacement and mitral valve replacement on 03/16/2023 on Coumadin  with noncompliance (INR goal 2.5-3.5), who presents to the ER with complaints of left flank pain that started this morning.  Reports history of kidney stones and symptoms feel the same.  No reported subjective fevers or chills.  In the ER, vital signs are stable.  CT abdomen and pelvis with contrast revealed severe left hydronephrosis similar to prior.  Abrupt tapering in the proximal ureter suggesting chronic UPJ obstruction.  No stones in the kidneys or ureters.  No perinephric or periureteral stranding.  Urinary bladder is unremarkable.  Cirrhosis.  Lab studies were notable for elevated liver chemistries, hemoglobin of 8.0 with MCV of 75, and an INR of 1.3.  EDP discussed the case with urology.  At this time no plan for cystoscopy or urology procedures.  Left hydronephrosis is likely chronic.  Dr. Avonne from urology recommended follow-up outpatient in the [facility] clinic in 1 to 2 weeks.  Due to subtherapeutic INR, the patient was started on heparin  drip, with the assistance of pharmacy, for Coumadin  bridging.  Admitted by [facility], hospitalist service.  ED Course: Temperature 98.5.  BP 136/75, pulse 95, respiration rate 20, O2 saturation 98% on room air.  Review of Systems: Review  of systems as noted in the HPI. All other systems reviewed and are negative.   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   Blood dyscrasia    GERD (gastroesophageal reflux disease)    History of ETOH abuse    History of kidney stones    Hypertension    Solitary kidney, acquired    Thrombocytopenia 04/12/2016   Past Surgical History:  Procedure Laterality Date   AORTIC VALVE REPLACEMENT N/A 03/16/2023   Procedure: AORTIC VALVE REPLACEMENT (AVR) USING ON-X PROSTHETIC AORTIC HEART VALVE WITH ANATOMIC SEWING RING SIZE ;  Surgeon: Kerrin Elspeth BROCKS, MD;  Location: [facility] OR;  Service: Open Heart Surgery;  Laterality: N/A;   BONE BIOPSY Right 02/09/2023   Procedure: BONE BIOPSY;  Surgeon: Arlinda Buster, MD;  Location: [facility];  Service: Orthopedics;  Laterality: Right;   COLONOSCOPY N/A 11/30/2023   Procedure: COLONOSCOPY;  Surgeon: Cinderella Thomas FALCON, MD;  Location: [facility];  Service: Endoscopy;  Laterality: N/A;   CORONARY ANGIOGRAPHY N/A 03/13/2023   Procedure: CORONARY ANGIOGRAPHY;  Surgeon: Anner Alm ORN, MD;  Location: [facility] INVASIVE CV LAB;  Service: Cardiovascular;  Laterality: N/A;   ESOPHAGOGASTRODUODENOSCOPY N/A 11/30/2023   Procedure: EGD (ESOPHAGOGASTRODUODENOSCOPY);  Surgeon: Cinderella Thomas FALCON, MD;  [facility]: [facility];  Service: Endoscopy;  Laterality: N/A;   HARDWARE REMOVAL Right 02/09/2023   Procedure: RIGHT FOREARM HARDWARE REMOVAL;  Surgeon: Arlinda Buster, MD;  Location: [facility];  Service: Orthopedics;  Laterality: Right;   IR THORACENTESIS ASP PLEURAL SPACE W/IMG GUIDE  03/23/2023   IR THORACENTESIS ASP PLEURAL SPACE W/IMG GUIDE  04/06/2023   IRRIGATION AND DEBRIDEMENT  ELBOW Right 02/09/2023   Procedure: RIGHT FOREARM IRRIGATION AND DEBRIDEMENT;  Surgeon: Arlinda Buster, MD;  Location: [facility];  Service: Orthopedics;  Laterality:  Right;   MITRAL VALVE REPLACEMENT N/A 03/16/2023   Procedure: MITRAL VALVE (MV) REPLACEMENT USING ON-X PROSTHETIC MITRAL HEART VALVE WITH STANDARD SEWING RING SIZE 31/33MM;  Surgeon: Kerrin Elspeth BROCKS, MD;  Location: [facility] OR;  Service: Open Heart Surgery;  Laterality: N/A;   ORIF ULNAR FRACTURE Right 02/27/2023   Procedure: RIGHT OPEN REDUCTION INTERNAL FIXATION (ORIF) ULNAR FRACTURE;  Surgeon: Arlinda Buster, MD;  Location: [facility];  Service: Orthopedics;  Laterality: Right;   renalectomy Left    TEE WITHOUT CARDIOVERSION N/A 03/16/2023   Procedure: TRANSESOPHAGEAL ECHOCARDIOGRAM (TEE);  Surgeon: Kerrin Elspeth BROCKS, MD;  Location: [facility] OR;  Service: Open Heart Surgery;  Laterality: N/A;   TRANSESOPHAGEAL ECHOCARDIOGRAM (CATH LAB) N/A 03/12/2023   Procedure: TRANSESOPHAGEAL ECHOCARDIOGRAM;  Surgeon: Barbaraann Darryle Ned, MD;  Location: [facility] INVASIVE CV LAB;  Service: Cardiovascular;  Laterality: N/A;   WRIST OSTEOTOMY Right 10/24/2022   Procedure: RIGHT WRIST ULNAR SHORTENING OSTEOTOMY;  Surgeon: Arlinda Buster, MD;  Location: [facility];  Service: Orthopedics;  Laterality: Right;  regional block    Social History:  reports that he has never smoked. He has never been exposed to tobacco smoke. He has never used smokeless tobacco. He reports current alcohol use. He reports that he does not use drugs.   Allergies  Allergen Reactions   Hydrocodone  Rash    Family History  Problem Relation Age of Onset   Healthy Mother    Cancer Father       Prior to Admission medications   Medication Sig Start Date End Date Taking? Authorizing Provider  acetaminophen  (TYLENOL ) 325 MG tablet Take 2 tablets (650 mg total) by mouth every 4 (four) hours as needed for mild pain (pain score 1-3) or fever. 03/26/23   Gold, Wayne E, PA-C  ALPRAZolam  (XANAX ) 1 MG tablet Take 2 mg by mouth at bedtime.    [provider]  cyanocobalamin (VITAMIN B12) 1000 MCG tablet Take 1  tablet (1,000 mcg total) by mouth daily. 11/30/23 01/29/24  Willette Adriana LABOR, MD  enoxaparin  (LOVENOX ) 100 MG/ML injection Inject 0.9 mLs (90 mg total) into the skin every 12 (twelve) hours. 11/30/23   Shahmehdi, Seyed A, MD  ferrous sulfate 325 (65 FE) MG tablet Take 1 tablet (325 mg total) by mouth 2 (two) times daily with a meal. 11/30/23 01/29/24  Shahmehdi, Adriana LABOR, MD  magnesium  oxide (MAG-OX) 400 (240 Mg) MG tablet Take 1 tablet (400 mg total) by mouth 2 (two) times daily. 03/26/23   Gold, Wayne E, PA-C  Multiple Vitamin (MULTIVITAMIN WITH MINERALS) TABS tablet Take 1 tablet by mouth daily. 12/01/23   Shahmehdi, Adriana LABOR, MD  pantoprazole  (PROTONIX ) 40 MG tablet Take 1 tablet (40 mg total) by mouth 2 (two) times daily. 11/30/23 12/30/23  Willette Adriana LABOR, MD  potassium chloride  SA (KLOR-CON  M) 20 MEQ tablet TAKE 1 TABLET BY MOUTH TWICE A DAY 06/04/23   Cooper, Michael, MD  warfarin (COUMADIN ) 4 MG tablet Take 2 tablets (8 mg total) by mouth daily at 4 PM. 11/30/23 12/30/23  Willette Adriana LABOR, MD    Physical Exam: BP (!) 155/89   Pulse 88   Temp 98.5 F (36.9 C) (Oral)   Resp 18   Ht 5' 11 (1.803 m)   Wt 86.2 kg   SpO2 98%   BMI 26.50 kg/m  General: 63 y.o. year-old male well developed well nourished in no acute distress.  Alert and oriented x3. Cardiovascular: Regular rate and rhythm with no rubs or gallops.  No thyromegaly or JVD noted.  No lower extremity edema. 2/4 pulses in all 4 extremities. Respiratory: Clear to auscultation with no wheezes or rales. Good inspiratory effort. Abdomen: Soft nontender nondistended with normal bowel sounds x4 quadrants. Muskuloskeletal: No cyanosis, clubbing or edema noted bilaterally Neuro: CN II-XII intact, strength, sensation, reflexes Skin: No ulcerative lesions noted or rashes Psychiatry: Judgement and insight appear normal. Mood is appropriate for condition and setting          Labs on Admission:  Basic Metabolic Panel: Recent Labs  Lab  01/07/24 1405  NA 137  K 4.0  CL 106  CO2 20*  GLUCOSE 94  BUN 7*  CREATININE 0.94  CALCIUM  8.4*   Liver Function Tests: Recent Labs  Lab 01/07/24 1405  AST 127*  ALT 96*  ALKPHOS 107  BILITOT 1.2  PROT 7.5  ALBUMIN  3.4*   No results for input(s): LIPASE, AMYLASE in the last 168 hours. No results for input(s): AMMONIA in the last 168 hours. CBC: Recent Labs  Lab 01/07/24 1405  WBC 3.5*  NEUTROABS 2.0  HGB 8.0*  HCT 28.3*  MCV 75.9*  PLT 196   Cardiac Enzymes: No results for input(s): CKTOTAL, CKMB, CKMBINDEX, TROPONINI in the last 168 hours.  BNP (last 3 results) Recent Labs    03/08/23 0811 04/27/23 1150 05/15/23 1129  BNP 211.0* 230.6* CANCELED    ProBNP (last 3 results) No results for input(s): PROBNP in the last 8760 hours.  CBG: No results for input(s): GLUCAP in the last 168 hours.  Radiological Exams on Admission: CT ABDOMEN PELVIS W CONTRAST Result Date: 01/07/2024 EXAM: CT ABDOMEN AND PELVIS WITH CONTRAST 01/07/2024 04:52:26 PM TECHNIQUE: CT of the abdomen and pelvis was performed with the administration of 100 mL of iohexol  (OMNIPAQUE ) 300 MG/ML solution. Multiplanar reformatted images are provided for review. Automated exposure control, iterative reconstruction, and/or weight-based adjustment of the mA/kV was utilized to reduce the radiation dose to as low as reasonably achievable. COMPARISON: Comparison with the 01/07/2024 study. CLINICAL HISTORY: Abdominal pain, acute, nonlocalized. FINDINGS: LOWER CHEST: No acute abnormality. LIVER: Cirrhosis. GALLBLADDER AND BILE DUCTS: Gallbladder is unremarkable. No biliary ductal dilatation. SPLEEN: No acute abnormality. PANCREAS: No acute abnormality. ADRENAL GLANDS: No acute abnormality. KIDNEYS, URETERS AND BLADDER: Delayed left nephrogram. Severe left hydronephrosis similar to prior. Abrupt tapering in the proximal ureter suggesting chronic UPJ obstruction. No stones in the kidneys or  ureters. No perinephric or periureteral stranding. Urinary bladder is unremarkable. GI AND BOWEL: Stomach demonstrates no acute abnormality. Normal appendix. There is no bowel obstruction. PERITONEUM AND RETROPERITONEUM: No ascites. No free air. VASCULATURE: Aorta is normal in caliber. LYMPH NODES: No lymphadenopathy. REPRODUCTIVE ORGANS: No acute abnormality. BONES AND SOFT TISSUES: No acute osseous abnormality. No focal soft tissue abnormality. IMPRESSION: 1. No acute abnormality. 2. Severe left hydronephrosis, similar to prior, with abrupt tapering in the proximal ureter suggesting chronic UPJ obstruction. 3. Cirrhosis. Electronically signed by: Norman Gatlin MD 01/07/2024 05:21 PM EST RP Workstation: [id number]   CT Renal Stone Study Result Date: 01/07/2024 CLINICAL DATA:  The abdominal/flank pain.  Concern for kidney stone. EXAM: CT ABDOMEN AND PELVIS WITHOUT CONTRAST TECHNIQUE: Multidetector CT imaging of the abdomen and pelvis was performed following the standard protocol without IV contrast. RADIATION DOSE REDUCTION: This exam was performed according to the departmental dose-optimization program which  includes automated exposure control, adjustment of the mA and/or kV according to patient size and/or use of iterative reconstruction technique. COMPARISON:  None Available. FINDINGS: Evaluation of this exam is limited in the absence of intravenous contrast. Lower chest: Trace left pleural effusion suspected. No intra-abdominal free air or free fluid. Hepatobiliary: Cirrhosis. No biliary dilatation. The gallbladder is unremarkable Pancreas: Unremarkable. No pancreatic ductal dilatation or surrounding inflammatory changes. Spleen: Normal in size without focal abnormality. Adrenals/Urinary Tract: The adrenal glands are unremarkable. There is a severe left hydronephrosis versus left renal parapelvic cysts. CT with IV contrast may provide better evaluation. The right kidney is unremarkable. No stone noted on  either side. The visualized ureters and urinary bladder appear unremarkable. Stomach/Bowel: Moderate stool throughout the colon. There is a small hiatal hernia. There is no bowel obstruction or active inflammation. The appendix is normal. Vascular/Lymphatic: Mild aortoiliac atherosclerotic disease. The IVC is unremarkable. No portal venous gas. There is no adenopathy. Reproductive: The prostate and seminal vesicles are grossly unremarkable. No pelvic mass. Other: None Musculoskeletal: Disc desiccation and vacuum phenomena at L5-S1. No acute osseous pathology. IMPRESSION: 1. Severe left hydronephrosis versus left renal parapelvic cysts. CT with IV contrast may provide better evaluation. No renal calculi. 2. Cirrhosis. 3. No bowel obstruction. Normal appendix. 4.  Aortic Atherosclerosis (ICD10-I70.0). Electronically Signed   By: Vanetta Chou M.D.   On: 01/07/2024 15:04    EKG: I independently viewed the EKG done and my findings are as followed: None available at the time of this visit.  Assessment/Plan Present on Admission:  Left flank pain  Principal Problem:   Left flank pain  Left flank pain likely secondary to chronic severe hydronephrosis seen on CT scan, POA. Continue supportive care Pain control as needed Gentle IV fluid hydration Monitor urine output Follow-up with urology, Dr. Avonne, outpatient in 1 to 2 weeks.  Subtherapeutic INR with mechanical valve and noncompliance Status post mechanical aortic valve replacement and mitral valve replacement on 03/16/2023 on Coumadin  with noncompliance (INR goal 2.5-3.5) Presented with INR 1.3 Pharmacy consulted to assist with heparin  drip, bridging to Coumadin  Daily INR  History of gastritis seen on EGD done in 11/30/2023 Resume home PPI twice daily Closely monitor H&H  Alcohol abuse Counsel on complete alcohol cessation.  Liver cirrhosis likely from alcohol abuse Follows with GI outpatient. Avoid hepatotoxic agents. Recommend  complete cessation of alcohol use  Elevated liver chemistries AST and ALT elevated Repeat CMP in the morning  Iron deficiency anemia Presented with hemoglobin of 8.0 and MCV of 75 Resume home iron supplement Monitor H&H  Generalized anxiety disorder Resume home Xanax  as needed   Critical care time: 55 minutes.   DVT prophylaxis: Heparin  drip bridging to Coumadin .  Code Status: Full code.  Family Communication: None at bedside.  Disposition Plan: Admitted to telemetry unit.  Consults called: Pharmacy consulted for Coumadin  management.  Admission status: Inpatient status.   Status is: Inpatient status. The patient requires at least 2 midnights for further evaluation and treatment of present condition.   Terry LOISE Hurst MD [facility] Pager [phone]  If 7PM-7AM, please contact night-coverage [email or web address] Password [email or web address]  01/07/2024, 7:30 PM

## 2024-01-08 ENCOUNTER — Other Ambulatory Visit (HOSPITAL_COMMUNITY): Payer: Self-pay

## 2024-01-08 ENCOUNTER — Telehealth (HOSPITAL_COMMUNITY): Payer: Self-pay | Admitting: Pharmacy Technician

## 2024-01-08 DIAGNOSIS — R10A2 Flank pain, left side: Secondary | ICD-10-CM | POA: Diagnosis not present

## 2024-01-08 LAB — COMPREHENSIVE METABOLIC PANEL WITH GFR
ALT: 87 U/L — ABNORMAL HIGH (ref 0–44)
AST: 119 U/L — ABNORMAL HIGH (ref 15–41)
Albumin: 3.2 g/dL — ABNORMAL LOW (ref 3.5–5.0)
Alkaline Phosphatase: 99 U/L (ref 38–126)
Anion gap: 7 (ref 5–15)
BUN: 9 mg/dL (ref 8–23)
CO2: 24 mmol/L (ref 22–32)
Calcium: 8.3 mg/dL — ABNORMAL LOW (ref 8.9–10.3)
Chloride: 106 mmol/L (ref 98–111)
Creatinine, Ser: 0.97 mg/dL (ref 0.61–1.24)
GFR, Estimated: >60 mL/min (ref >60)
Glucose, Bld: 90 mg/dL (ref 70–99)
Potassium: 4.1 mmol/L (ref 3.5–5.1)
Sodium: 137 mmol/L (ref 135–145)
Total Bilirubin: 0.9 mg/dL (ref 0.0–1.2)
Total Protein: 7 g/dL (ref 6.5–8.1)

## 2024-01-08 LAB — HEPARIN LEVEL (UNFRACTIONATED)
Heparin Unfractionated: 0.16 [IU]/mL — ABNORMAL LOW (ref 0.30–0.70)
Heparin Unfractionated: 0.29 [IU]/mL — ABNORMAL LOW (ref 0.30–0.70)
Heparin Unfractionated: 0.39 [IU]/mL (ref 0.30–0.70)

## 2024-01-08 LAB — CBC
HCT: 27.8 % — ABNORMAL LOW (ref 39.0–52.0)
Hemoglobin: 8 g/dL — ABNORMAL LOW (ref 13.0–17.0)
MCH: 21.6 pg — ABNORMAL LOW (ref 26.0–34.0)
MCHC: 28.8 g/dL — ABNORMAL LOW (ref 30.0–36.0)
MCV: 75.1 fL — ABNORMAL LOW (ref 80.0–100.0)
Platelets: 184 K/uL (ref 150–400)
RBC: 3.7 MIL/uL — ABNORMAL LOW (ref 4.22–5.81)
RDW: 21.2 % — ABNORMAL HIGH (ref 11.5–15.5)
WBC: 3.6 K/uL — ABNORMAL LOW (ref 4.0–10.5)
nRBC: 0 % (ref 0.0–0.2)

## 2024-01-08 LAB — PHOSPHORUS: Phosphorus: 3.4 mg/dL (ref 2.5–4.6)

## 2024-01-08 LAB — MAGNESIUM: Magnesium: 2.1 mg/dL (ref 1.7–2.4)

## 2024-01-08 LAB — PROTIME-INR
INR: 1.3 — ABNORMAL HIGH (ref 0.8–1.2)
Prothrombin Time: 17 s — ABNORMAL HIGH (ref 11.4–15.2)

## 2024-01-08 MED ORDER — HEPARIN BOLUS VIA INFUSION
2000.0000 [IU] | Freq: Once | INTRAVENOUS | Status: AC
  Administered 2024-01-08: 2000 [IU] via INTRAVENOUS
  Filled 2024-01-08: qty 2000

## 2024-01-08 MED ORDER — FOLIC ACID 1 MG PO TABS
1.0000 mg | ORAL_TABLET | Freq: Every day | ORAL | Status: DC
  Administered 2024-01-08 – 2024-01-09 (×2): 1 mg via ORAL
  Filled 2024-01-08 (×2): qty 1

## 2024-01-08 MED ORDER — DIPHENHYDRAMINE HCL 50 MG/ML IJ SOLN
12.5000 mg | Freq: Once | INTRAMUSCULAR | Status: AC | PRN
  Administered 2024-01-08: 12.5 mg via INTRAVENOUS
  Filled 2024-01-08: qty 1

## 2024-01-08 MED ORDER — ORAL CARE MOUTH RINSE: 15.0000 mL | OROMUCOSAL | Status: DC | PRN

## 2024-01-08 MED ORDER — WARFARIN SODIUM 5 MG PO TABS
10.0000 mg | ORAL_TABLET | Freq: Once | ORAL | Status: AC
  Administered 2024-01-08: 10 mg via ORAL
  Filled 2024-01-08: qty 2

## 2024-01-08 MED ORDER — THIAMINE MONONITRATE 100 MG PO TABS
100.0000 mg | ORAL_TABLET | Freq: Every day | ORAL | Status: DC
  Administered 2024-01-08 – 2024-01-09 (×2): 100 mg via ORAL
  Filled 2024-01-08 (×2): qty 1

## 2024-01-08 MED ORDER — DIPHENHYDRAMINE HCL 25 MG PO CAPS
25.0000 mg | ORAL_CAPSULE | ORAL | Status: DC | PRN
  Administered 2024-01-08 – 2024-01-09 (×4): 25 mg via ORAL
  Filled 2024-01-08 (×4): qty 1

## 2024-01-08 MED ORDER — DIPHENHYDRAMINE HCL 50 MG/ML IJ SOLN: 12.5000 mg | INTRAMUSCULAR | Status: DC

## 2024-01-08 NOTE — Consult Note (Signed)
 PHARMACY - ANTICOAGULATION CONSULT NOTE  Pharmacy Consult for Heparin  and Warfarin Indication: MVR  Allergies  Allergen Reactions   Hydrocodone  Rash    Patient Measurements: Height: 5' 11 (180.3 cm) Weight: 87.3 kg (192 lb 7.4 oz) IBW/kg (Calculated) : 75.3 HEPARIN  DW (KG): 86.2  Vital Signs: Temp: 98.6 F (37 C) (11/11 1306) Temp Source: Oral (11/11 1306) BP: 129/74 (11/11 1306) Pulse Rate: 89 (11/11 1306)  Labs: Recent Labs    01/07/24 1405 01/08/24 0200 01/08/24 0835 01/08/24 1534  HGB 8.0* 8.0*  --   --   HCT 28.3* 27.8*  --   --   PLT 196 184  --   --   APTT 34  --   --   --   LABPROT 17.1* 17.0*  --   --   INR 1.3* 1.3*  --   --   HEPARINUNFRC  --  0.16* 0.29* 0.39  CREATININE 0.94 0.97  --   --     Estimated Creatinine Clearance: 83 mL/min (by C-G formula based on SCr of 0.97 mg/dL).   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 Per patient, he states that he had stopped taking his Coumadin  at one point, but has restarted it. He does not go to Coumadin  clinic. He has been managing Coumadin  at home by himself. Pharmacy has been consulted to initiate and dose continuous heparin  infusion and warfarin.  Baseline labs: INR 1.3, Hgb 8.0, Plts 196, aPTT pending HL 0.39- therapeutic  Goal of Therapy:  INR 2.5-3.5 Heparin  level 0.3-0.7 units/ml Monitor platelets by anticoagulation  protocol: Yes   Plan:  Warfarin - Give warfarin 10mg  x 1 this evening - Recheck INR daily with morning labs - CBC/H&H daily  Heparin  - Continue heparin  infusion at 1850 units/hr - Check anti-Xa level in 6 hours and daily while on heparin  - Continue heparin  until INR is therapeutic - Continue to monitor H&H and platelets  Elspeth Sour, PharmD Clinical Pharmacist 01/08/2024 4:23 PM

## 2024-01-08 NOTE — Progress Notes (Signed)
  Transition of Care [facility]) Screening Note   Patient Details  Name: Thomas [name] Date of Birth: Dec 27, 1960   Transition of Care [facility]) CM/SW Contact:    Hoy DELENA Bigness, LCSW Phone Number: 01/08/2024, 10:31 AM    Transition of Care Department [facility]) has reviewed patient and no TOC needs have been identified at this time. We will continue to monitor patient advancement through interdisciplinary progression rounds. If new patient transition needs arise, please place a TOC consult.    01/08/24 1031  TOC Brief Assessment  Insurance and Status Reviewed  Patient has primary care physician Yes  Home environment has been reviewed From home alone  Prior level of function: Independent  Prior/Current Home Services No current home services  Social Drivers of Health Review SDOH reviewed no interventions necessary  Readmission risk has been reviewed Yes  Transition of care needs no transition of care needs at this time

## 2024-01-08 NOTE — Consult Note (Addendum)
 PHARMACY - ANTICOAGULATION CONSULT NOTE  Pharmacy Consult for Heparin  and Warfarin Indication: MVR  Allergies  Allergen Reactions   Hydrocodone  Rash    Patient Measurements: Height: 5' 11 (180.3 cm) Weight: 87.3 kg (192 lb 7.4 oz) IBW/kg (Calculated) : 75.3 HEPARIN  DW (KG): 86.2  Vital Signs: Temp: 98.2 F (36.8 C) (11/11 0410) Temp Source: Oral (11/11 0410) BP: 152/78 (11/11 0410) Pulse Rate: 106 (11/11 0410)  Labs: Recent Labs    01/07/24 1405 01/08/24 0200 01/08/24 0835  HGB 8.0* 8.0*  --   HCT 28.3* 27.8*  --   PLT 196 184  --   APTT 34  --   --   LABPROT 17.1* 17.0*  --   INR 1.3* 1.3*  --   HEPARINUNFRC  --  0.16* 0.29*  CREATININE 0.94 0.97  --     Estimated Creatinine Clearance: 83 mL/min (by C-G formula based on SCr of 0.97 mg/dL).   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 Per patient, he states that he had stopped taking his Coumadin  at one point, but has restarted it. He does not go to Coumadin  clinic. He has been managing Coumadin  at home by himself. Pharmacy has been consulted to initiate and dose continuous heparin  infusion and warfarin.  Baseline labs: INR 1.3, Hgb 8.0, Plts 196, aPTT pending  Goal of Therapy:  INR 2.5-3.5 Heparin  level 0.3-0.7 units/ml Monitor platelets by anticoagulation protocol: Yes   Plan:  Warfarin - Give warfarin 10mg  x 1 this evening -  Recheck INR daily with morning labs - CBC/H&H daily  Heparin  - Increase heparin  infusion from 1700 units/hr to 1850 units/hr - Check anti-Xa level in 6 hours and daily while on heparin  - Continue heparin  until INR is therapeutic - Continue to monitor H&H and platelets  Chancy Aquas Saryna Kneeland 01/08/2024,9:10 AM

## 2024-01-08 NOTE — Telephone Encounter (Signed)
 Patient Product/process Development Scientist completed.    The patient is insured through [employer] MEDICA[employer].     Ran test claim for warfarin 5 mg and the current 30 day co-pay is $4.00.  Ran test claim for enoxaparin  (Lovenox ) 100 mg and the current 30 day co-pay is $4.00.   This test claim was processed through [facility]- copay amounts may vary at other pharmacies due to pharmacy/plan contracts, or as the patient moves through the different stages of their insurance plan.     Thomas [name], CPHT Pharmacy Technician Patient Advocate Specialist Lead [facility] Patient Advocate Team Direct Number: [phone]  Fax: [phone]

## 2024-01-08 NOTE — Plan of Care (Signed)

## 2024-01-08 NOTE — Progress Notes (Signed)
 PROGRESS NOTE    Thomas [name] ORN [facility]  FMW:[id number] DOB: January 26, 1961 DOA: 01/07/2024 PCP: Marvine Rush, MD   Brief Narrative:   63 y.o. male with medical history significant for hypertension, GERD, generalized anxiety disorder, iron deficiency anemia, chronic gastritis on twice daily PPI (ran out), alcohol abuse, left nephrectomy (around the age of 32, due to 64 kidney stones, they had to remove the left kidney), chronic HFpEF, aortic and mitral valve endocarditis, severe aortic insufficiency and mitral regurgitation with vegetations status post mechanical aortic valve replacement and mitral valve replacement on 03/16/2023 on Coumadin  with medication noncompliance (INR goal 2.5-3.5), who presents to the ER with complaints of left flank pain. CT abdomen and pelvis with contrast revealed severe left hydronephrosis similar to prior. Abrupt tapering in the proximal ureter suggesting chronic UPJ obstruction. No stones in the kidneys or ureters. No perinephric or periureteral stranding. Urinary bladder is unremarkable and Cirrhosis.  As per urology, he needs outpatient follow-up on discharge. He has been noncompliant with his Coumadin .  He is currently on oral Coumadin  as well as heparin  drip for bridging.  Pharmacy on board.  Follow-up INR, goal is between 2.5-3.5.   Assessment & Plan:  Principal Problem:   Left flank pain    Left flank pain likely secondary to chronic severe hydronephrosis seen on CT scan, POA. UA negative for pyuria.  Afebrile with no leukocytosis. Continue supportive care Pain control as needed Gentle IV fluid hydration Monitor urine output Follow-up with urology, Dr. Lovie, outpatient in 1 week.   Subtherapeutic INR with mechanical valve and medication noncompliance Status post mechanical aortic valve replacement and mitral valve replacement on 03/16/2023 on Coumadin  with noncompliance (INR goal 2.5-3.5) Presented with INR 1.3 Was prescribed Lovenox  outpatient which he has  not picked up, stating it was not covered by his Medicaid. TOC consulted to assist with medications. Pharmacy consulted to assist with heparin  drip, bridging to Coumadin  Daily INR   History of gastritis seen on EGD done 11/30/2023 Resumed home PPI twice daily (ran out) Closely monitor H&H   Alcohol abuse Counseled on alcohol cessation. The patient was receptive stating he significantly cut down on the amount of alcohol he consumes.  The last time he had a beer was 3 days ago. No sign of alcohol withdrawal at the time of this visit. Continue multivitamins, thiamine , and folic acid  supplements.   Liver cirrhosis likely from alcohol abuse Follows with GI outpatient. Avoid hepatotoxic agents. Recommend complete cessation of alcohol use   Elevated liver chemistries AST and ALT elevated Repeat CMP in the morning   Iron deficiency anemia Presented with hemoglobin of 8.0 and MCV of 75 Resume home iron supplement Monitor H&H   Generalized anxiety disorder Resumed home Xanax  as needed   Medication noncompliance Educated on the importance of medication compliance with his mechanical valves.  He was receptive.  Disposition: Home.  Patient is independent actives of daily life.  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  DVT prophylaxis: Patient is on oral Coumadin  and heparin  drip     Code Status: Full Code Family Communication: None present at the bedside Status is: Inpatient Remains inpatient appropriate because: Back pain, subtherapeutic INR, on heparin  drip    Subjective:  He states that his left flank pain is better.  We spoke about the importance of following up outpatient with urology.  He is currently on heparin  drip as well as oral Coumadin .  He said that he has been  noncompliant with the Coumadin  in the past but has started taking it 1 month back.  We spoke about the importance of being on anticoagulation in the setting of history of  mechanical aortic valve and mitral valve replacement.   Examination:  General exam: Appears calm and comfortable  Respiratory system: Clear to auscultation. Respiratory effort normal. Cardiovascular system: S1 & S2 heard, RRR. No JVD, murmurs, rubs, gallops or clicks. No pedal edema. Gastrointestinal system: Abdomen is nondistended, soft and nontender. No organomegaly or masses felt. Normal bowel sounds heard. Central nervous system: Alert and oriented. No focal neurological deficits. Extremities: Symmetric 5 x 5 power. Skin: No rashes, lesions or ulcers Psychiatry: Judgement and insight appear normal. Mood & affect appropriate.      Diet Orders (From admission, onward)     Start     Ordered   01/07/24 1928  Diet Heart Room service appropriate? Yes; Fluid consistency: Thin  Diet effective now       Question Answer Comment  Room service appropriate? Yes   Fluid consistency: Thin      01/07/24 1927            Objective: Vitals:   01/07/24 2113 01/08/24 0122 01/08/24 0410 01/08/24 0500  BP: (!) 163/82 (!) 165/88 (!) 152/78   Pulse: 87 (!) 108 (!) 106   Resp: 19 18 20    Temp: 98 F (36.7 C) 98.6 F (37 C) 98.2 F (36.8 C)   TempSrc: Oral Oral Oral   SpO2: 97% 99% 96%   Weight: 87.3 kg   87.3 kg  Height:        Intake/Output Summary (Last 24 hours) at 01/08/2024 0918 Last data filed at 01/08/2024 0825 Gross per 24 hour  Intake --  Output 1200 ml  Net -1200 ml   Filed Weights   01/07/24 1335 01/07/24 2113 01/08/24 0500  Weight: 86.2 kg 87.3 kg 87.3 kg    Scheduled Meds:  cyanocobalamin  1,000 mcg Oral Daily   ferrous sulfate  325 mg Oral BID WC   folic acid   1 mg Oral Daily   multivitamin with minerals  1 tablet Oral Daily   pantoprazole   40 mg Oral BID   thiamine   100 mg Oral Daily   Warfarin - Pharmacist Dosing Inpatient   Does not apply q1600   Continuous Infusions:  heparin  1,700 Units/hr (01/08/24 0820)   lactated ringers  75 mL/hr at 01/08/24  0853    Nutritional status     Body mass index is 26.84 kg/m.  Data Reviewed:   CBC: Recent Labs  Lab 01/07/24 1405 01/08/24 0200  WBC 3.5* 3.6*  NEUTROABS 2.0  --   HGB 8.0* 8.0*  HCT 28.3* 27.8*  MCV 75.9* 75.1*  PLT 196 184   Basic Metabolic Panel: Recent Labs  Lab 01/07/24 1405 01/08/24 0200  NA 137 137  K 4.0 4.1  CL 106 106  CO2 20* 24  GLUCOSE 94 90  BUN 7* 9  CREATININE 0.94 0.97  CALCIUM  8.4* 8.3*  MG  --  2.1  PHOS  --  3.4   GFR: Estimated Creatinine Clearance: 83 mL/min (by C-G formula based on SCr of 0.97 mg/dL). Liver Function Tests: Recent Labs  Lab 01/07/24 1405 01/08/24 0200  AST 127* 119*  ALT 96* 87*  ALKPHOS 107 99  BILITOT 1.2 0.9  PROT 7.5 7.0  ALBUMIN  3.4* 3.2*   No results for input(s): LIPASE, AMYLASE in the last 168 hours. No results for input(s): AMMONIA in  the last 168 hours. Coagulation Profile: Recent Labs  Lab 01/07/24 1405 01/08/24 0200  INR 1.3* 1.3*   Cardiac Enzymes: No results for input(s): CKTOTAL, CKMB, CKMBINDEX, TROPONINI in the last 168 hours. BNP (last 3 results) No results for input(s): PROBNP in the last 8760 hours. HbA1C: No results for input(s): HGBA1C in the last 72 hours. CBG: No results for input(s): GLUCAP in the last 168 hours. Lipid Profile: No results for input(s): CHOL, HDL, LDLCALC, TRIG, CHOLHDL, LDLDIRECT in the last 72 hours. Thyroid Function Tests: No results for input(s): TSH, T4TOTAL, FREET4, T3FREE, THYROIDAB in the last 72 hours. Anemia Panel: No results for input(s): VITAMINB12, FOLATE, FERRITIN, TIBC, IRON, RETICCTPCT in the last 72 hours. Sepsis Labs: No results for input(s): PROCALCITON, LATICACIDVEN in the last 168 hours.  No results found for this or any previous visit (from the past 240 hours).       Radiology Studies: CT ABDOMEN PELVIS W CONTRAST Result Date: 01/07/2024 EXAM: CT ABDOMEN AND PELVIS WITH  CONTRAST 01/07/2024 04:52:26 PM TECHNIQUE: CT of the abdomen and pelvis was performed with the administration of 100 mL of iohexol  (OMNIPAQUE ) 300 MG/ML solution. Multiplanar reformatted images are provided for review. Automated exposure control, iterative reconstruction, and/or weight-based adjustment of the mA/kV was utilized to reduce the radiation dose to as low as reasonably achievable. COMPARISON: Comparison with the 01/07/2024 study. CLINICAL HISTORY: Abdominal pain, acute, nonlocalized. FINDINGS: LOWER CHEST: No acute abnormality. LIVER: Cirrhosis. GALLBLADDER AND BILE DUCTS: Gallbladder is unremarkable. No biliary ductal dilatation. SPLEEN: No acute abnormality. PANCREAS: No acute abnormality. ADRENAL GLANDS: No acute abnormality. KIDNEYS, URETERS AND BLADDER: Delayed left nephrogram. Severe left hydronephrosis similar to prior. Abrupt tapering in the proximal ureter suggesting chronic UPJ obstruction. No stones in the kidneys or ureters. No perinephric or periureteral stranding. Urinary bladder is unremarkable. GI AND BOWEL: Stomach demonstrates no acute abnormality. Normal appendix. There is no bowel obstruction. PERITONEUM AND RETROPERITONEUM: No ascites. No free air. VASCULATURE: Aorta is normal in caliber. LYMPH NODES: No lymphadenopathy. REPRODUCTIVE ORGANS: No acute abnormality. BONES AND SOFT TISSUES: No acute osseous abnormality. No focal soft tissue abnormality. IMPRESSION: 1. No acute abnormality. 2. Severe left hydronephrosis, similar to prior, with abrupt tapering in the proximal ureter suggesting chronic UPJ obstruction. 3. Cirrhosis. Electronically signed by: Norman Gatlin MD 01/07/2024 05:21 PM EST RP Workstation: [id number]   CT Renal Stone Study Result Date: 01/07/2024 CLINICAL DATA:  The abdominal/flank pain.  Concern for kidney stone. EXAM: CT ABDOMEN AND PELVIS WITHOUT CONTRAST TECHNIQUE: Multidetector CT imaging of the abdomen and pelvis was performed following the standard  protocol without IV contrast. RADIATION DOSE REDUCTION: This exam was performed according to the departmental dose-optimization program which includes automated exposure control, adjustment of the mA and/or kV according to patient size and/or use of iterative reconstruction technique. COMPARISON:  None Available. FINDINGS: Evaluation of this exam is limited in the absence of intravenous contrast. Lower chest: Trace left pleural effusion suspected. No intra-abdominal free air or free fluid. Hepatobiliary: Cirrhosis. No biliary dilatation. The gallbladder is unremarkable Pancreas: Unremarkable. No pancreatic ductal dilatation or surrounding inflammatory changes. Spleen: Normal in size without focal abnormality. Adrenals/Urinary Tract: The adrenal glands are unremarkable. There is a severe left hydronephrosis versus left renal parapelvic cysts. CT with IV contrast may provide better evaluation. The right kidney is unremarkable. No stone noted on either side. The visualized ureters and urinary bladder appear unremarkable. Stomach/Bowel: Moderate stool throughout the colon. There is a small hiatal hernia. There is no bowel obstruction  or active inflammation. The appendix is normal. Vascular/Lymphatic: Mild aortoiliac atherosclerotic disease. The IVC is unremarkable. No portal venous gas. There is no adenopathy. Reproductive: The prostate and seminal vesicles are grossly unremarkable. No pelvic mass. Other: None Musculoskeletal: Disc desiccation and vacuum phenomena at L5-S1. No acute osseous pathology. IMPRESSION: 1. Severe left hydronephrosis versus left renal parapelvic cysts. CT with IV contrast may provide better evaluation. No renal calculi. 2. Cirrhosis. 3. No bowel obstruction. Normal appendix. 4.  Aortic Atherosclerosis (ICD10-I70.0). Electronically Signed   By: Vanetta Chou M.D.   On: 01/07/2024 15:04           LOS: 1 day   Time spent= 35 mins    Deliliah Room, MD [facility]  If  7PM-7AM, please contact night-coverage  01/08/2024, 9:18 AM

## 2024-01-08 NOTE — Progress Notes (Signed)
 PHARMACY - ANTICOAGULATION CONSULT NOTE  Pharmacy Consult for heparin  Indication: MVR/AVR  Labs: Recent Labs    01/07/24 1405 01/08/24 0200  HGB 8.0* 8.0*  HCT 28.3* 27.8*  PLT 196 184  APTT 34  --   LABPROT 17.1* 17.0*  INR 1.3* 1.3*  HEPARINUNFRC  --  0.16*  CREATININE 0.94 0.97   Assessment: 63yo male subtherapeutic on heparin  with initial dosing for subtherapeutic INR; no infusion issues or signs of bleeding per RN.  Goal of Therapy:  Heparin  level 0.3-0.7 units/ml   Plan:  2000 units heparin  bolus. Increase heparin  infusion by 3-4 units/kg/hr to 1700 units/hr. Check level in 6 hours.   Marvetta Dauphin, PharmD, BCPS 01/08/2024 2:47 AM

## 2024-01-09 DIAGNOSIS — R10A2 Flank pain, left side: Secondary | ICD-10-CM | POA: Diagnosis not present

## 2024-01-09 LAB — CBC
HCT: 28.2 % — ABNORMAL LOW (ref 39.0–52.0)
Hemoglobin: 8.2 g/dL — ABNORMAL LOW (ref 13.0–17.0)
MCH: 21.8 pg — ABNORMAL LOW (ref 26.0–34.0)
MCHC: 29.1 g/dL — ABNORMAL LOW (ref 30.0–36.0)
MCV: 74.8 fL — ABNORMAL LOW (ref 80.0–100.0)
Platelets: 199 K/uL (ref 150–400)
RBC: 3.77 MIL/uL — ABNORMAL LOW (ref 4.22–5.81)
RDW: 21.2 % — ABNORMAL HIGH (ref 11.5–15.5)
WBC: 3.8 K/uL — ABNORMAL LOW (ref 4.0–10.5)
nRBC: 0 % (ref 0.0–0.2)

## 2024-01-09 LAB — HEPARIN LEVEL (UNFRACTIONATED): Heparin Unfractionated: 0.49 [IU]/mL (ref 0.30–0.70)

## 2024-01-09 LAB — PROTIME-INR
INR: 1.4 — ABNORMAL HIGH (ref 0.8–1.2)
Prothrombin Time: 18.1 s — ABNORMAL HIGH (ref 11.4–15.2)

## 2024-01-09 MED ORDER — WARFARIN SODIUM 5 MG PO TABS: 10.0000 mg | ORAL_TABLET | Freq: Once | ORAL | Status: DC

## 2024-01-09 NOTE — Progress Notes (Signed)
 PROGRESS NOTE    [name] [facility]  FMW:[id number] DOB: [birth year] DOA: 01/07/2024 PCP: Marvine Rush, MD   Brief Narrative:   63 y.o. male with medical history significant for hypertension, GERD, generalized anxiety disorder, iron deficiency anemia, chronic gastritis on twice daily PPI (ran out), alcohol abuse, left nephrectomy (around the age of 78, due to 77 kidney stones, they had to remove the left kidney), chronic HFpEF, aortic and mitral valve endocarditis, severe aortic insufficiency and mitral regurgitation with vegetations status post mechanical aortic valve replacement and mitral valve replacement on 03/16/2023 on Coumadin  with medication noncompliance (INR goal 2.5-3.5), who presents to the ER with complaints of left flank pain. CT abdomen and pelvis with contrast revealed severe left hydronephrosis similar to prior. Abrupt tapering in the proximal ureter suggesting chronic UPJ obstruction. No stones in the kidneys or ureters. No perinephric or periureteral stranding. Urinary bladder is unremarkable and Cirrhosis.  As per urology, he needs outpatient follow-up on discharge. He has been noncompliant with his Coumadin .  He is currently on oral Coumadin  as well as heparin  drip for bridging.  Pharmacy on board.  Follow-up INR, goal is between 2.5-3.5.   Assessment & Plan:  Principal Problem:   Left flank pain    Left flank pain likely secondary to chronic severe hydronephrosis seen on CT scan, POA. UA negative for pyuria.  Afebrile with no leukocytosis. Continue supportive care Pain control as needed Gentle IV fluid hydration Monitor urine output Follow-up with urology, Dr. Lovie, outpatient in 1 week.   Subtherapeutic INR with mechanical valve and medication noncompliance Status post mechanical aortic valve replacement and mitral valve replacement on 03/16/2023 on Coumadin  with noncompliance (INR goal 2.5-3.5) Presented with INR 1.3 Was prescribed Lovenox  outpatient which he has  not picked up, stating it was not covered by his Medicaid. TOC consulted to assist with medications. Pharmacy consulted to assist with heparin  drip, bridging to Coumadin  Daily INR   History of gastritis seen on EGD done 11/30/2023 Resumed home PPI twice daily (ran out) Closely monitor H&H   Alcohol abuse Counseled on alcohol cessation. The patient was receptive stating he significantly cut down on the amount of alcohol he consumes.  The last time he had a beer was 3 days ago. No sign of alcohol withdrawal at the time of this visit. Continue multivitamins, thiamine , and folic acid  supplements.   Liver cirrhosis likely from alcohol abuse Follows with GI outpatient. Avoid hepatotoxic agents. Recommend complete cessation of alcohol use   Elevated liver chemistries AST and ALT elevated Repeat CMP in the morning   Iron deficiency anemia Presented with hemoglobin of 8.0 and MCV of 75 Resume home iron supplement Monitor H&H   Generalized anxiety disorder Resumed home Xanax  as needed   Medication noncompliance Educated on the importance of medication compliance with his mechanical valves.  He was receptive.  Disposition: Home.  Patient is independent actives of daily life.  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  DVT prophylaxis: Patient is on oral Coumadin  and heparin  drip warfarin (COUMADIN ) tablet 10 mg     Code Status: Full Code Family Communication: None present at the bedside Status is: Inpatient Remains inpatient appropriate because: Back pain, subtherapeutic INR, on heparin  drip    Subjective:  Patient's INR today is 1.4 and I have informed patient about that.  I informed the importance of being on heparin  drip as well as oral Coumadin  until goal INR of 2.5-3.5 is achieved.  He said that he cannot stay in the hospital any longer and wants to leave the hospital AGAINST MEDICAL ADVICE.  I explained the risks of leaving the hospital against  medical advice including cardiopulmonary arrest and death.  Patient understood the risks and still wanted to leave the hospital.   Examination:  General exam: Appears calm and comfortable  Respiratory system: Clear to auscultation. Respiratory effort normal. Cardiovascular system: S1 & S2 heard, RRR. No JVD, murmurs, rubs, gallops or clicks. No pedal edema. Gastrointestinal system: Abdomen is nondistended, soft and nontender. No organomegaly or masses felt. Normal bowel sounds heard. Central nervous system: Alert and oriented. No focal neurological deficits. Extremities: Symmetric 5 x 5 power. Skin: No rashes, lesions or ulcers Psychiatry: Judgement and insight appear normal. Mood & affect appropriate.      Diet Orders (From admission, onward)     Start     Ordered   01/07/24 1928  Diet Heart Room service appropriate? Yes; Fluid consistency: Thin  Diet effective now       Question Answer Comment  Room service appropriate? Yes   Fluid consistency: Thin      01/07/24 1927            Objective: Vitals:   01/08/24 1306 01/08/24 2028 01/09/24 0431 01/09/24 0434  BP: 129/74 123/69 136/88   Pulse: 89 91 92   Resp:  20 20   Temp: 98.6 F (37 C) 99 F (37.2 C) 99 F (37.2 C)   TempSrc: Oral Oral Oral   SpO2: 97% 96% 96%   Weight:    87 kg  Height:        Intake/Output Summary (Last 24 hours) at 01/09/2024 0936 Last data filed at 01/09/2024 0846 Gross per 24 hour  Intake 3263.53 ml  Output 4525 ml  Net -1261.47 ml   Filed Weights   01/07/24 2113 01/08/24 0500 01/09/24 0434  Weight: 87.3 kg 87.3 kg 87 kg    Scheduled Meds:  cyanocobalamin  1,000 mcg Oral Daily   ferrous sulfate  325 mg Oral BID WC   folic acid   1 mg Oral Daily   multivitamin with minerals  1 tablet Oral Daily   pantoprazole   40 mg Oral BID   thiamine   100 mg Oral Daily   warfarin  10 mg Oral ONCE-1600   Warfarin - Pharmacist Dosing Inpatient   Does not apply q1600   Continuous Infusions:   heparin  1,850 Units/hr (01/08/24 2315)    Nutritional status     Body mass index is 26.75 kg/m.  Data Reviewed:   CBC: Recent Labs  Lab 01/07/24 1405 01/08/24 0200 01/09/24 0417  WBC 3.5* 3.6* 3.8*  NEUTROABS 2.0  --   --   HGB 8.0* 8.0* 8.2*  HCT 28.3* 27.8* 28.2*  MCV 75.9* 75.1* 74.8*  PLT 196 184 199   Basic Metabolic Panel: Recent Labs  Lab 01/07/24 1405 01/08/24 0200  NA 137 137  K 4.0 4.1  CL 106 106  CO2 20* 24  GLUCOSE 94 90  BUN 7* 9  CREATININE 0.94 0.97  CALCIUM  8.4* 8.3*  MG  --  2.1  PHOS  --  3.4   GFR: Estimated Creatinine Clearance: 83 mL/min (by C-G formula based on SCr of 0.97 mg/dL). Liver Function Tests: Recent Labs  Lab 01/07/24 1405 01/08/24 0200  AST 127* 119*  ALT 96* 87*  ALKPHOS 107 99  BILITOT 1.2 0.9  PROT 7.5 7.0  ALBUMIN  3.4* 3.2*   No results  for input(s): LIPASE, AMYLASE in the last 168 hours. No results for input(s): AMMONIA in the last 168 hours. Coagulation Profile: Recent Labs  Lab 01/07/24 1405 01/08/24 0200 01/09/24 0417  INR 1.3* 1.3* 1.4*   Cardiac Enzymes: No results for input(s): CKTOTAL, CKMB, CKMBINDEX, TROPONINI in the last 168 hours. BNP (last 3 results) No results for input(s): PROBNP in the last 8760 hours. HbA1C: No results for input(s): HGBA1C in the last 72 hours. CBG: No results for input(s): GLUCAP in the last 168 hours. Lipid Profile: No results for input(s): CHOL, HDL, LDLCALC, TRIG, CHOLHDL, LDLDIRECT in the last 72 hours. Thyroid Function Tests: No results for input(s): TSH, T4TOTAL, FREET4, T3FREE, THYROIDAB in the last 72 hours. Anemia Panel: No results for input(s): VITAMINB12, FOLATE, FERRITIN, TIBC, IRON, RETICCTPCT in the last 72 hours. Sepsis Labs: No results for input(s): PROCALCITON, LATICACIDVEN in the last 168 hours.  No results found for this or any previous visit (from the past 240 hours).        Radiology Studies: CT ABDOMEN PELVIS W CONTRAST Result Date: 01/07/2024 EXAM: CT ABDOMEN AND PELVIS WITH CONTRAST 01/07/2024 04:52:26 PM TECHNIQUE: CT of the abdomen and pelvis was performed with the administration of 100 mL of iohexol  (OMNIPAQUE ) 300 MG/ML solution. Multiplanar reformatted images are provided for review. Automated exposure control, iterative reconstruction, and/or weight-based adjustment of the mA/kV was utilized to reduce the radiation dose to as low as reasonably achievable. COMPARISON: Comparison with the 01/07/2024 study. CLINICAL HISTORY: Abdominal pain, acute, nonlocalized. FINDINGS: LOWER CHEST: No acute abnormality. LIVER: Cirrhosis. GALLBLADDER AND BILE DUCTS: Gallbladder is unremarkable. No biliary ductal dilatation. SPLEEN: No acute abnormality. PANCREAS: No acute abnormality. ADRENAL GLANDS: No acute abnormality. KIDNEYS, URETERS AND BLADDER: Delayed left nephrogram. Severe left hydronephrosis similar to prior. Abrupt tapering in the proximal ureter suggesting chronic UPJ obstruction. No stones in the kidneys or ureters. No perinephric or periureteral stranding. Urinary bladder is unremarkable. GI AND BOWEL: Stomach demonstrates no acute abnormality. Normal appendix. There is no bowel obstruction. PERITONEUM AND RETROPERITONEUM: No ascites. No free air. VASCULATURE: Aorta is normal in caliber. LYMPH NODES: No lymphadenopathy. REPRODUCTIVE ORGANS: No acute abnormality. BONES AND SOFT TISSUES: No acute osseous abnormality. No focal soft tissue abnormality. IMPRESSION: 1. No acute abnormality. 2. Severe left hydronephrosis, similar to prior, with abrupt tapering in the proximal ureter suggesting chronic UPJ obstruction. 3. Cirrhosis. Electronically signed by: Norman Gatlin MD 01/07/2024 05:21 PM EST RP Workstation: [id number]   CT Renal Stone Study Result Date: 01/07/2024 CLINICAL DATA:  The abdominal/flank pain.  Concern for kidney stone. EXAM: CT ABDOMEN AND PELVIS  WITHOUT CONTRAST TECHNIQUE: Multidetector CT imaging of the abdomen and pelvis was performed following the standard protocol without IV contrast. RADIATION DOSE REDUCTION: This exam was performed according to the departmental dose-optimization program which includes automated exposure control, adjustment of the mA and/or kV according to patient size and/or use of iterative reconstruction technique. COMPARISON:  None Available. FINDINGS: Evaluation of this exam is limited in the absence of intravenous contrast. Lower chest: Trace left pleural effusion suspected. No intra-abdominal free air or free fluid. Hepatobiliary: Cirrhosis. No biliary dilatation. The gallbladder is unremarkable Pancreas: Unremarkable. No pancreatic ductal dilatation or surrounding inflammatory changes. Spleen: Normal in size without focal abnormality. Adrenals/Urinary Tract: The adrenal glands are unremarkable. There is a severe left hydronephrosis versus left renal parapelvic cysts. CT with IV contrast may provide better evaluation. The right kidney is unremarkable. No stone noted on either side. The visualized ureters and urinary bladder appear  unremarkable. Stomach/Bowel: Moderate stool throughout the colon. There is a small hiatal hernia. There is no bowel obstruction or active inflammation. The appendix is normal. Vascular/Lymphatic: Mild aortoiliac atherosclerotic disease. The IVC is unremarkable. No portal venous gas. There is no adenopathy. Reproductive: The prostate and seminal vesicles are grossly unremarkable. No pelvic mass. Other: None Musculoskeletal: Disc desiccation and vacuum phenomena at L5-S1. No acute osseous pathology. IMPRESSION: 1. Severe left hydronephrosis versus left renal parapelvic cysts. CT with IV contrast may provide better evaluation. No renal calculi. 2. Cirrhosis. 3. No bowel obstruction. Normal appendix. 4.  Aortic Atherosclerosis (ICD10-I70.0). Electronically Signed   By: Vanetta Chou M.D.   On:  01/07/2024 15:04           LOS: 1 day   Time spent= 35 mins    Deliliah Room, MD [facility]  If 7PM-7AM, please contact night-coverage  01/09/2024, 9:36 AM

## 2024-01-09 NOTE — Consult Note (Addendum)
 PHARMACY - ANTICOAGULATION CONSULT NOTE  Pharmacy Consult for Heparin  and Warfarin Indication: MVR  Allergies  Allergen Reactions   Hydrocodone  Rash    Patient Measurements: Height: 5' 11 (180.3 cm) Weight: 87 kg (191 lb 12.8 oz) IBW/kg (Calculated) : 75.3 HEPARIN  DW (KG): 86.2  Vital Signs: Temp: 99 F (37.2 C) (11/12 0431) Temp Source: Oral (11/12 0431) BP: 136/88 (11/12 0431) Pulse Rate: 92 (11/12 0431)  Labs: Recent Labs    01/07/24 1405 01/07/24 1405 01/08/24 0200 01/08/24 0835 01/08/24 1534 01/09/24 0417  HGB 8.0*  --  8.0*  --   --  8.2*  HCT 28.3*  --  27.8*  --   --  28.2*  PLT 196  --  184  --   --  199  APTT 34  --   --   --   --   --   LABPROT 17.1*  --  17.0*  --   --  18.1*  INR 1.3*  --  1.3*  --   --  1.4*  HEPARINUNFRC  --    < > 0.16* 0.29* 0.39 0.49  CREATININE 0.94  --  0.97  --   --   --    < > = values in this interval not displayed.    Estimated Creatinine Clearance: 83 mL/min (by C-G formula based on SCr of 0.97 mg/dL).   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 Per patient, he states that he had stopped taking his Coumadin  at one point, but has restarted it. He does not go to Coumadin  clinic. He has been managing Coumadin  at home by himself. Pharmacy has been consulted to initiate and dose continuous heparin  infusion and warfarin.  Baseline labs: INR 1.3, Hgb  8.0, Plts 196, aPTT pending HL 0.39- therapeutic  Goal of Therapy:  INR 2.5-3.5 Heparin  level 0.3-0.7 units/ml Monitor platelets by anticoagulation protocol: Yes   Plan:  Warfarin - Give warfarin 10mg  x 1 this evening - Recheck INR daily with morning labs - CBC/H&H daily  Heparin  - Continue heparin  infusion at 1850 units/hr - Check anti-Xa level in 6 hours and daily while on heparin  - Continue heparin  until INR is therapeutic - Continue to monitor H&H and platelets  Chancy Leer Bob Daversa PharmD Candidate 01/09/2024 8:01 AM

## 2024-01-09 NOTE — Discharge Summary (Signed)
  Physician Discharge Summary   Patient: Thomas [name] MRN: [id number] DOB: April 09, 1960  Admit date:     01/07/2024  Discharge date: 01/09/24  Discharge Physician: Deliliah Room   Patient left the hospital AGAINST MEDICAL ADVICE.  63 y.o. male with medical history significant for hypertension, GERD, generalized anxiety disorder, iron deficiency anemia, chronic gastritis on twice daily PPI (ran out), alcohol abuse, left nephrectomy (around the age of 75, due to 64 kidney stones, they had to remove the left kidney), chronic HFpEF, aortic and mitral valve endocarditis, severe aortic insufficiency and mitral regurgitation with vegetations status post mechanical aortic valve replacement and mitral valve replacement on 03/16/2023 on Coumadin  with medication noncompliance (INR goal 2.5-3.5), who presents to the ER with complaints of left flank pain. CT abdomen and pelvis with contrast revealed severe left hydronephrosis similar to prior. Abrupt tapering in the proximal ureter suggesting chronic UPJ obstruction. No stones in the kidneys or ureters. No perinephric or periureteral stranding. Urinary bladder is unremarkable and Cirrhosis.  As per urology, he needs outpatient follow-up on discharge.  He was on heparin  drip as well as oral Coumadin  and pharmacy was managing that to achieve a goal INR between 2.5-3.5.  His INR on the day of the discharge was 1.4. Patient decided to leave the hospital AGAINST MEDICAL ADVICE.  I personally spoke to him about the risks of leaving the hospital AGAINST MEDICAL ADVICE in the setting of subtherapeutic INR, including cardiopulmonary arrest and death.  Patient understood the risks and still wanted to leave.  He was strongly advised to follow-up outpatient with urology as well as cardiology and his PCP.  Signed: Deliliah Room, MD [facility] 01/09/2024

## 2024-01-16 ENCOUNTER — Ambulatory Visit (INDEPENDENT_AMBULATORY_CARE_PROVIDER_SITE_OTHER): Admitting: Gastroenterology

## 2024-01-29 ENCOUNTER — Ambulatory Visit: Attending: Physician Assistant | Admitting: Physician Assistant

## 2024-01-29 ENCOUNTER — Encounter: Payer: Self-pay | Admitting: Physician Assistant

## 2024-01-29 VITALS — BP 131/76 | HR 101 | Ht 71.0 in | Wt 205.4 lb

## 2024-01-29 DIAGNOSIS — Z952 Presence of prosthetic heart valve: Secondary | ICD-10-CM | POA: Insufficient documentation

## 2024-01-29 DIAGNOSIS — I3139 Other pericardial effusion (noninflammatory): Secondary | ICD-10-CM | POA: Insufficient documentation

## 2024-01-29 DIAGNOSIS — Z79899 Other long term (current) drug therapy: Secondary | ICD-10-CM | POA: Insufficient documentation

## 2024-01-29 DIAGNOSIS — I2510 Atherosclerotic heart disease of native coronary artery without angina pectoris: Secondary | ICD-10-CM | POA: Diagnosis not present

## 2024-01-29 DIAGNOSIS — Z91199 Patient's noncompliance with other medical treatment and regimen due to unspecified reason: Secondary | ICD-10-CM | POA: Insufficient documentation

## 2024-01-29 MED ORDER — MAGNESIUM OXIDE -MG SUPPLEMENT 400 (240 MG) MG PO TABS: 400.0000 mg | ORAL_TABLET | Freq: Two times a day (BID) | ORAL | 2 refills | Status: AC

## 2024-01-29 MED ORDER — FERROUS SULFATE 325 (65 FE) MG PO TABS: 325.0000 mg | ORAL_TABLET | Freq: Two times a day (BID) | ORAL | 2 refills | Status: AC

## 2024-01-29 MED ORDER — PANTOPRAZOLE SODIUM 40 MG PO TBEC: 40.0000 mg | DELAYED_RELEASE_TABLET | Freq: Two times a day (BID) | ORAL | 2 refills | Status: AC

## 2024-01-29 NOTE — Progress Notes (Unsigned)
 Cardiology Office Note   Date:  01/29/2024  ID:  Thomas [name] Thomas [name], Thomas [name] 18-Mar-1960, MRN [id number] PCP: Marvine Rush, MD  [facility] Providers Cardiologist:  Ozell Fell, MD { Click to update primary MD,subspecialty MD or APP then REFRESH:1}    History of Present Illness Thomas [name] is a 63 y.o. male with PMH of HTN, history of resolved pericardial effusion, SBE s/p mechanical AVR and mechanical MVR, postop afib, CAD, left nephrectomy at age 69 (due to kidney stones?), remote substance abuse (cocaine, snort, never injected, last use 2010), anxiety, GERD and thrombocytopenia. He underwent R ulnar shortening osteotomy in August 2024. He later developed hardware failure and osteolysis requiring plate removal and bone biopsy in Dec 2024. Culture was negative. 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 Since his procedure, he has developed intermittent fever. He presented to the hospital on 03/07/2022 with CHF. TTE on 03/09/2023 showed EF > 75%, severe AI with linear shaped echodensity measuring 2 cm x 0.35 cm concerning for endocarditis. 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 EF was 60 to 65%. Blood culture came back positive for Streptococcus infantarius (formerly known as strep bovis). He was treated empiric abx. He was seen by Dr. Kerrin for consideration of valve replacement surgery. Pre-surgical cath on 03/13/2023 showed 60% prox to mid LAD lesion, 60% stenosis in side branch of D1, 30% mid to distal LAD, 50% distal LAD. Doppler obtained on 1/15 showed 1-39% bilateral ICA disease. He underwent 21 mm Onyx Mechanical AVR and 31-32mm Onyx Mechanical MVR on 03/16/2023 by Dr. Kerrin. Post procedure, telemetry showed nonspecific intraventricular conduction delay followed by accelerated junctional rhythm. Beta blocker  was not started. He was seen by Dr. Nancey who recommended continue monitoring. CHB resolved, EP decided to hold off PPM implant. He did have postop afib. He remainted on PCN G for strep species. He had left thoracentesis with removal of 1200 cc fluid on 1/24. 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  Since discharged, he underwent repeat L thoracentesis with removal of 1.5 L on 04/06/2023.   Repeat echo 04/09/2023 showed stable aortic and mitral valve replacement, EF 65-70%, no RWMA, RVSP 38 mmHg, severe biatrial enlargement, large pericardial effusion and moderate left pleural effusion.  I initially tried to increase the Lasix , however repeat echocardiogram obtained on 04/26/2023 continue to show EF 65 to 70%, no regional wall motion abnormality, normal RV, severe left atrial enlargement, trivial MR with mechanical valve replacement, mechanical aortic valve present, pericardial effusion measuring up to 2.25 cm unchanged in size when compared to the previous echocardiogram on 04/09/2023. There was no evidence of cardiac tamponade.  We ultimately decided to proceed with outpatient pericardiocentesis.  We set him up for outpatient pericardiocentesis procedure was Lovenox  bridging.  On the day of the procedure, limited 2D echo performed in the short stay demonstrated near resolution of the pericardial effusion was only very small amount of fluid left in the subcostal border.  Pericardiocentesis was aborted.  Since procedure, he has not been compliant with INR check.   He was readmitted in October 2025 after PCP noted his hemoglobin has dropped down to 6.8.  Cardiology service was involved for preoperative clearance.  It was noted in the cardiology consultation note, that patient has stopped taking warfarin about a month prior to the  hospitalization for unclear reason.  He was seen by GI service and underwent EGD and colonoscopy.  EGD showed  normal esophagus, erythematous mucosa in the antrum which was biopsied, scalloped mucosa in duodenum was also biopsied.  Colonoscopy showed 3 polyps in the transverse colon removed with cold snare.  Patient was treated with IV iron and cleared to restart anticoagulation therapy.  Unfortunately, since discharge, it does not appear patient has been followed by warfarin clinic.  He presented back to the hospital in 01/07/2024 was complaining of left flank pain.  CT of abdomen and pelvis revealed severe left hydronephrosis, abrupt tapering of the proximal ureter suggest chronic UPJ obstruction, no stones in the kidney or ureters.  The case was discussed with the urology service who felt that left hydronephrosis is likely chronic and recommended outpatient follow-up with urology.  He was resumed on heparin  and warfarin but left AMA before INR reach therapeutic level.  Patient presents today for a delayed follow-up.  He has canceled multiple visits with me before.  He does complain of generalized itchiness.  Although patient says he has been compliant with warfarin, I am not confident about his compliance.  I had a long discussion with the patient and our clinical pharmacist.  He is aware that his subtherapeutic INR combined with his mechanical valve puts him at risk for catastrophic mechanical valve failure, stroke or even death, whereas supratherapeutic INR will put him at risk for major bleeding.  Therefore it is imperative for him to follow-up with warfarin clinic.  Since his compliance is questionable, I am unable to prescribe him a course of warfarin until he can demonstrate compliance.  I will schedule him to follow-up in our [facility] warfarin clinic at the next opening.  I have emphasized multiple times with the patient that he need to keep warfarin check regularly.  I will obtain CBC and PT/INR today.  Otherwise, he can follow-up with Dr. Wonda in 6 months.  ROS: ***  Studies Reviewed      *** Risk  Assessment/Calculations {Does this patient have ATRIAL FIBRILLATION?:[id number]}         Physical Exam VS:  BP 131/76 (BP Location: Right Arm, Patient Position: Sitting, Cuff Size: Large)   Pulse (!) 101   Ht 5' 11 (1.803 m)   Wt 205 lb 6.4 oz (93.2 kg)   SpO2 95%   BMI 28.65 kg/m        Wt Readings from Last 3 Encounters:  01/29/24 205 lb 6.4 oz (93.2 kg)  01/09/24 191 lb 12.8 oz (87 kg)  11/30/23 199 lb (90.3 kg)    GEN: Well nourished, well developed in no acute distress NECK: No JVD; No carotid bruits CARDIAC: ***RRR, no murmurs, rubs, gallops RESPIRATORY:  Clear to auscultation without rales, wheezing or rhonchi  ABDOMEN: Soft, non-tender, non-distended EXTREMITIES:  No edema; No deformity   ASSESSMENT AND PLAN ***    {Are you ordering a CV Procedure (e.g. stress test, cath, DCCV, TEE, etc)?   Press F2        :[id number]}  Dispo: ***  Signed, Scot Ford, PA

## 2024-01-29 NOTE — Patient Instructions (Signed)
 Medication Instructions:  NO CHANGES *If you need a refill on your cardiac medications before your next appointment, please call your pharmacy*  Lab Work: CBC AND PT/INR TODAY If you have labs (blood work) drawn today and your tests are completely normal, you will receive your results only by: MyChart Message (if you have MyChart) OR A paper copy in the mail If you have any lab test that is abnormal or we need to change your treatment, we will call you to review the results.  Testing/Procedures: NO TESTING  Follow-Up: At [facility], you and your health needs are our priority.  As part of our continuing mission to provide you with exceptional heart care, our providers are all part of one team.  This team includes your primary Cardiologist (physician) and Advanced Practice Providers or APPs (Physician Assistants and Nurse Practitioners) who all work together to provide you with the care you need, when you need it.  Your next appointment:   6 month(s)  Provider:   Ozell Fell, MD    Other Instructions FOLLOW UP NEXT AVAILABLE APPOINTMENT WITH WARFARIN CLINIC IN [place].

## 2024-01-31 ENCOUNTER — Ambulatory Visit (HOSPITAL_COMMUNITY): Admit: 2024-01-31 | Admitting: Gastroenterology

## 2024-01-31 ENCOUNTER — Encounter (HOSPITAL_COMMUNITY): Payer: Self-pay

## 2024-01-31 SURGERY — IMAGING PROCEDURE, GI TRACT, INTRALUMINAL, VIA CAPSULE
Anesthesia: Choice

## 2024-02-06 ENCOUNTER — Ambulatory Visit: Admitting: Urology

## 2024-02-12 ENCOUNTER — Encounter (INDEPENDENT_AMBULATORY_CARE_PROVIDER_SITE_OTHER): Payer: Self-pay | Admitting: Gastroenterology

## 2024-02-12 ENCOUNTER — Ambulatory Visit (INDEPENDENT_AMBULATORY_CARE_PROVIDER_SITE_OTHER): Admitting: Gastroenterology

## 2024-03-11 ENCOUNTER — Encounter: Payer: Self-pay | Admitting: Thoracic Surgery (Cardiothoracic Vascular Surgery)

## 2024-05-13 ENCOUNTER — Ambulatory Visit: Payer: Self-pay
